# Patient Record
Sex: Female | Born: 1940 | Race: Black or African American | Hispanic: No | Marital: Married | Smoking: Never smoker
Health system: Southern US, Community
[De-identification: ages and names within clinical notes are randomized; demographics above are authoritative.]

## PROBLEM LIST (undated history)

## (undated) DIAGNOSIS — I10 Essential (primary) hypertension: Secondary | ICD-10-CM

## (undated) DIAGNOSIS — K219 Gastro-esophageal reflux disease without esophagitis: Secondary | ICD-10-CM

## (undated) DIAGNOSIS — I639 Cerebral infarction, unspecified: Secondary | ICD-10-CM

## (undated) DIAGNOSIS — I209 Angina pectoris, unspecified: Secondary | ICD-10-CM

## (undated) DIAGNOSIS — Z973 Presence of spectacles and contact lenses: Secondary | ICD-10-CM

## (undated) DIAGNOSIS — I2510 Atherosclerotic heart disease of native coronary artery without angina pectoris: Secondary | ICD-10-CM

## (undated) DIAGNOSIS — I499 Cardiac arrhythmia, unspecified: Secondary | ICD-10-CM

## (undated) DIAGNOSIS — H409 Unspecified glaucoma: Secondary | ICD-10-CM

## (undated) DIAGNOSIS — E785 Hyperlipidemia, unspecified: Secondary | ICD-10-CM

## (undated) DIAGNOSIS — E119 Type 2 diabetes mellitus without complications: Secondary | ICD-10-CM

## (undated) DIAGNOSIS — N200 Calculus of kidney: Secondary | ICD-10-CM

## (undated) DIAGNOSIS — N390 Urinary tract infection, site not specified: Secondary | ICD-10-CM

## (undated) DIAGNOSIS — M1990 Unspecified osteoarthritis, unspecified site: Secondary | ICD-10-CM

## (undated) HISTORY — DX: Cerebral infarction, unspecified: I63.9

## (undated) HISTORY — PX: ROTATOR CUFF REPAIR: SHX139

## (undated) HISTORY — PX: EYE SURGERY: SHX253

## (undated) HISTORY — DX: Hyperlipidemia, unspecified: E78.5

## (undated) HISTORY — DX: Unspecified osteoarthritis, unspecified site: M19.90

## (undated) HISTORY — DX: Type 2 diabetes mellitus without complications: E11.9

---

## 2002-03-17 ENCOUNTER — Other Ambulatory Visit: Admission: RE | Admit: 2002-03-17 | Discharge: 2002-03-17 | Payer: Self-pay | Admitting: Family Medicine

## 2002-08-02 ENCOUNTER — Encounter: Payer: Self-pay | Admitting: Family Medicine

## 2002-08-02 ENCOUNTER — Encounter: Admission: RE | Admit: 2002-08-02 | Discharge: 2002-08-02 | Payer: Self-pay | Admitting: Family Medicine

## 2002-08-03 ENCOUNTER — Encounter: Payer: Self-pay | Admitting: Urology

## 2002-08-03 ENCOUNTER — Encounter: Admission: RE | Admit: 2002-08-03 | Discharge: 2002-08-03 | Payer: Self-pay | Admitting: Urology

## 2002-08-09 ENCOUNTER — Encounter: Payer: Self-pay | Admitting: Urology

## 2002-08-09 ENCOUNTER — Encounter: Admission: RE | Admit: 2002-08-09 | Discharge: 2002-08-09 | Payer: Self-pay | Admitting: Urology

## 2002-08-18 ENCOUNTER — Encounter: Payer: Self-pay | Admitting: Urology

## 2002-08-18 ENCOUNTER — Encounter: Admission: RE | Admit: 2002-08-18 | Discharge: 2002-08-18 | Payer: Self-pay | Admitting: Urology

## 2002-08-24 ENCOUNTER — Encounter: Payer: Self-pay | Admitting: Urology

## 2002-08-24 ENCOUNTER — Ambulatory Visit (HOSPITAL_COMMUNITY): Admission: RE | Admit: 2002-08-24 | Discharge: 2002-08-24 | Payer: Self-pay | Admitting: Urology

## 2002-09-07 ENCOUNTER — Encounter: Payer: Self-pay | Admitting: Urology

## 2002-09-07 ENCOUNTER — Encounter: Admission: RE | Admit: 2002-09-07 | Discharge: 2002-09-07 | Payer: Self-pay | Admitting: Urology

## 2002-10-12 ENCOUNTER — Encounter: Payer: Self-pay | Admitting: Urology

## 2002-10-12 ENCOUNTER — Encounter: Admission: RE | Admit: 2002-10-12 | Discharge: 2002-10-12 | Payer: Self-pay | Admitting: Urology

## 2002-10-21 ENCOUNTER — Encounter: Admission: RE | Admit: 2002-10-21 | Discharge: 2002-10-21 | Payer: Self-pay | Admitting: Urology

## 2002-10-21 ENCOUNTER — Encounter: Payer: Self-pay | Admitting: Urology

## 2002-10-25 ENCOUNTER — Encounter: Payer: Self-pay | Admitting: Urology

## 2002-10-25 ENCOUNTER — Ambulatory Visit (HOSPITAL_BASED_OUTPATIENT_CLINIC_OR_DEPARTMENT_OTHER): Admission: RE | Admit: 2002-10-25 | Discharge: 2002-10-25 | Payer: Self-pay | Admitting: Urology

## 2002-11-09 ENCOUNTER — Encounter: Admission: RE | Admit: 2002-11-09 | Discharge: 2002-11-09 | Payer: Self-pay | Admitting: Urology

## 2002-11-09 ENCOUNTER — Encounter: Payer: Self-pay | Admitting: Urology

## 2002-12-06 ENCOUNTER — Encounter: Payer: Self-pay | Admitting: Urology

## 2002-12-06 ENCOUNTER — Ambulatory Visit (HOSPITAL_BASED_OUTPATIENT_CLINIC_OR_DEPARTMENT_OTHER): Admission: RE | Admit: 2002-12-06 | Discharge: 2002-12-06 | Payer: Self-pay | Admitting: Urology

## 2002-12-20 ENCOUNTER — Encounter: Payer: Self-pay | Admitting: Urology

## 2002-12-20 ENCOUNTER — Encounter: Admission: RE | Admit: 2002-12-20 | Discharge: 2002-12-20 | Payer: Self-pay | Admitting: Urology

## 2006-05-16 ENCOUNTER — Ambulatory Visit (HOSPITAL_BASED_OUTPATIENT_CLINIC_OR_DEPARTMENT_OTHER): Admission: RE | Admit: 2006-05-16 | Discharge: 2006-05-16 | Payer: Self-pay | Admitting: Urology

## 2006-05-17 ENCOUNTER — Emergency Department (HOSPITAL_COMMUNITY): Admission: EM | Admit: 2006-05-17 | Discharge: 2006-05-17 | Payer: Self-pay | Admitting: Emergency Medicine

## 2007-01-30 ENCOUNTER — Inpatient Hospital Stay (HOSPITAL_COMMUNITY): Admission: AD | Admit: 2007-01-30 | Discharge: 2007-02-05 | Payer: Self-pay | Admitting: Internal Medicine

## 2007-02-09 ENCOUNTER — Ambulatory Visit (HOSPITAL_COMMUNITY): Admission: RE | Admit: 2007-02-09 | Discharge: 2007-02-09 | Payer: Self-pay | Admitting: Urology

## 2007-07-21 ENCOUNTER — Encounter: Admission: RE | Admit: 2007-07-21 | Discharge: 2007-07-21 | Payer: Self-pay | Admitting: Family Medicine

## 2007-07-31 ENCOUNTER — Encounter: Admission: RE | Admit: 2007-07-31 | Discharge: 2007-07-31 | Payer: Self-pay | Admitting: Family Medicine

## 2007-08-20 ENCOUNTER — Ambulatory Visit (HOSPITAL_COMMUNITY): Admission: RE | Admit: 2007-08-20 | Discharge: 2007-08-21 | Payer: Self-pay | Admitting: Orthopedic Surgery

## 2007-09-29 ENCOUNTER — Encounter: Admission: RE | Admit: 2007-09-29 | Discharge: 2007-10-27 | Payer: Self-pay | Admitting: Orthopedic Surgery

## 2011-04-02 NOTE — Op Note (Signed)
NAME:  CORLISS, COGGESHALL NO.:  [id number]   MEDICAL RECORD NO.:  [id number]          PATIENT TYPE:  AMB   LOCATION:  SDS                          FACILITY:  [facility]   PHYSICIAN:  Myrtie Neither, MD      DATE OF BIRTH:  [birth year]   DATE OF PROCEDURE:  08/20/2007  DATE OF DISCHARGE:                               OPERATIVE REPORT   PREOPERATIVE DIAGNOSIS:  Impingement syndrome, rotator cuff tear, left  shoulder.   POSTOPERATIVE DIAGNOSIS:  Impingement syndrome, rotator cuff tear, left  shoulder, with chronic synovitis.   ANESTHESIA:  General.   PROCEDURE:  1. Arthroscopic synovectomy and acromioplasty and decompression, left      shoulder.  2. Mini open rotator cuff repair with Biomet suture anchor.   DESCRIPTION OF PROCEDURE:  The patient was taken to the operating room.  After being given adequate preop medications, general anesthesia, and  being intubated, the patient was placed in barber chair position.  The  left shoulder was prepped with DuraPrep.   A 1/2-inch puncture wound was made posteriorly.  A lateral incision was  made and a separate posterior incision made.  Arthroscope placed through  lateral incision.  Inflow was through the arthroscope.  The shaver was  placed posterolaterally.  Inspection of the joint revealed hypertrophic  overgrowth of the subacromial bursal sac, eburnation of the subacromial  surface, with downward tilt of the acromion.  Complete synovectomy was  done followed by acromioplasty and decompression of the coracohumeral  ligament.  After adequate debridement and decompression, the lateral  incision was extended down to the deltoid.  The deltoid was split.  The  rotator cuff tear was identified involving the subscapularis, V-shaped  tear.  With the use of suture anchor, this was closed completely.  Further inspection did not reveal any impingement.  With the use of a  rasp, the anterior lip of the acromion were further smoothed.   Copious  irrigation was done.  Wound closure was then done with 0 for the fascia,  2-0 for the subcutaneous, and skin staples for the skin.  A compressive  dressing was applied.  The patient was placed in shoulder abduction  pillow brace.   The patient tolerated the procedure quite well and went to the recovery  room in stable and satisfactory condition.  The patient is being kept  for 23-hour observation for pain control.  The patient will be  discharged in stable and satisfactory condition to continue her shoulder  abduction brace, ice packs, and will be discharged on Percocet 5 mg 1  q.6h. p.r.n. for pain.      Myrtie Neither, MD  Electronically Signed     AC/MEDQ  D:  08/20/2007  T:  08/20/2007  Job:  [id number]

## 2011-04-05 NOTE — Consult Note (Signed)
NAME:  Audrey Mullen, Audrey Mullen NO.:  [id number]   MEDICAL RECORD NO.:  [id number]          PATIENT TYPE:  INP   LOCATION:  6735                         FACILITY:  [facility]   PHYSICIAN:  Lindaann Slough, M.D.  DATE OF BIRTH:  1941-01-14   DATE OF CONSULTATION:  02/03/2007  DATE OF DISCHARGE:                                 CONSULTATION   REASON FOR CONSULTATION:  Right hydronephrosis.   HISTORY:  The patient is a 70 year old female who has a history of  kidney stone.  She was admitted on January 30, 2007 with a 1-2 weeks  history of chest pain, shortness of breath and vomiting.  The pain was  diffuse across her chest.  On admission, her creatinine was 2.02.  She  has a past history of kidney stone and had stone extraction done on May 16, 2006.  She has been doing well since then.  She has not had any back  pain.  However, about 4 or 5 days ago she had severe right and left  lower quadrant pain but the pain did not last.  The pain was severe in  quality and nature and nonradiating.  She denies frequency or gross  hematuria.  Renal ultrasound showed moderate right hydronephrosis and a  known stone in the lower pole of the left kidney.  She has been on  liquid diet and she has not had any nausea and vomiting.  However, when  she takes solid food, she starts to throw up.   PAST MEDICAL HISTORY:  1. Kidney stone.  2. Hypertension.  3. Diabetes.  4. Hyperlipidemia.  5. Coronary artery disease.   PAST SURGICAL HISTORY:  She had left ureteral stone extraction done in  June2007 and history of left ESL in December2003 and January 2004.   SOCIAL HISTORY:  She is married.  Has one child.  Does not smoke nor  drink.   FAMILY HISTORY:  Is positive for hypertension, heart disease, diabetes,  and kidney stones.  She has four brothers and four sisters.   ALLERGIES:  NO KNOWN DRUG ALLERGIES.   MEDICATIONS:  Diovan/HCT and Avandia.   REVIEW OF SYSTEMS:  Review of systems is as per  the HPI and everything  else is negative.   PHYSICAL EXAMINATION:  GENERAL:  This is a well-developed, 70 year old  female who is currently in no acute distress.  She is alert and  oriented.  VITAL SIGNS:  Blood pressure is 143/80, pulse 76, respirations 20,  temperature 99.5.  SKIN:  She has no skin rash  ABDOMEN:  Soft and nontender.  She has no CVA tenderness.  Kidneys are  not palpable.  She has no hepatomegaly, no splenomegaly.  Bladder is not  distended.  She has no umbilical or inguinal hernia.  There is no  inguinal adenopathy.  Bowel sounds are normal.  I did not do a pelvic  examination.   LABORATORY DATA:  Hemoglobin 9, hematocrit 26.3 and WBC 8.3.  Sodium  136, potassium 3.5, BUN 6 at this time; was 17 on admission.  Creatinine  is now 1.55; was 2.02 on admission.  Renal ultrasound shows moderate  right hydronephrosis and a 7 mm stone in the lower pole of the left  kidney that is nonobstructing.   IMPRESSION:  1. Left renal calculus.  2. Right hydronephrosis.  3. Diabetes.  4. Hypertension.   SUGGESTIONS:  CT scan of the abdomen and pelvis without IV contrast to  rule out ureteral calculus as the cause of hydronephrosis.  Further  recommendation will depend on the results of CT scan.      Lindaann Slough, M.D.  Electronically Signed     MN/MEDQ  D:  02/03/2007  T:  02/04/2007  Job:  [id number]

## 2011-04-05 NOTE — Op Note (Signed)
NAME:  Audrey Mullen, Audrey Mullen NO.:  [id number]   MEDICAL RECORD NO.:  [id number]          PATIENT TYPE:  AMB   LOCATION:  NESC                         FACILITY:  [facility]   PHYSICIAN:  Mark C. Vernie Ammons, M.D.  DATE OF BIRTH:  1941-06-04   DATE OF PROCEDURE:  DATE OF DISCHARGE:                                 OPERATIVE REPORT   PREOPERATIVE DIAGNOSIS:  Left renal calculi postop.   POSTOPERATIVE DIAGNOSIS:  1.  Left renal calculi.  2.  Urethral stenosis.  3.  Upper pole infundibular stenosis.   PROCEDURE:  Cystoscopy, left retrograde pyelogram with interpretation,  urethral dilatation, left ureteroscopy, laser lithotripsy, stone extraction  and double-J stent placement.   SURGEON:  Mark C. Vernie Ammons, M.D.   ANESTHESIA:  General.   SPECIMEN:  Stone to patient.   BLOOD LOSS:  Minimal.   DRAINS:  6-French 24 cm Polaris stent (with string).   COMPLICATIONS:  None.   INDICATIONS:  The patient is a 70 year old black female who has had  difficulty with renal calculi in the past.  She underwent lithotripsy of the  stones twice in the past.  She complains of some flank discomfort.  We  discussed all treatment options.  These are outlined in my office notes  which have been placed on the chart.  She has elected to proceed with  ureteroscopic extraction of as many stones as possible and understands the  risks, complications and alternatives.   DESCRIPTION OF OPERATION:  After informed consent, the patient was brought  to the major OR, placed on table, administered general anesthesia. She is  then moved to the dorsal lithotomy position.  Genitalia sterilely prepped  and draped.  Initial attempt at passing a 22-French cystoscope with sheath  with obturator was unsuccessful due to some mild urethral stenosis.  I  therefore dilated with female sounds from 18 to 28-French.  I then was  easily able to pass the 22-French cystoscope into the bladder.   Cystoscopy was performed  using a 12 and 70 degrees lenses.  The bladder was  fully inspect noted be free of any tumor, stones or inflammatory lesions.  Mild cystocele was identified.  The left orifice was then identified and  0.038 inch floppy tip guidewire was then passed up the left ureter under  direct fluoroscopic control into the area of the left kidney.  When I then  left the guidewire in place and removed the cystoscope.  Over the guidewire,  the inner cannula of the access sheath was passed over the guidewire in  order to facilitate gentle dilatation of the ureter.  This passed up the  ureter easily; however, when the outer working portion of the access sheath  was placed on the obturator, an attempt pass up the ureter was unsuccessful,  met with some mild resistance.  I therefore removed the access sheath and  passed a 10 cm 15 French ureteral dilating balloon over the guidewire and  dilated the distal ureter.  I then removed that and then reinserted the  access sheath and was easily able to pass this up into the area of the UPJ.  The  guidewire was then removed and then the ureteroscope was passed through  the access sheath into the area of the renal pelvis.   Retrograde pyelogram was then performed in order to delineate the anatomy of  the collecting system.  After performing this, systematic search of each of  the calyces was undertaken.  I began at the superior aspect of the kidney.  I noted no stone but there did appear to be infundibulum that narrowed  almost to a beak on retrograde that appeared to have a small pit associated  with it.  I placed the scope at the location of this pit and injected  contrast but could not get it to fill beyond the tip of the scope.  I  therefore continued systematic search of each calix of the kidney.  This was  performed in a systematic fashion from superior to inferior with each calix  and infundibulum being inspected.  In the most dependent portion of the  kidney, I  was able to identify several stones.  I first used a 200 micron  laser fiber to attempt fragmentation although the stone seemed particularly  hard.  I therefore abandoned the laser fragmentation and passed a nitinol  basket through the ureteroscope.  I grasped the first stone and easily  removed this through the access sheath.  I then reinserted the scope,  identified a second stone and removed that.  A total of four stones removed  in this fashion without difficulty or injury to the ureter.   I then reinserted the ureteroscope and inspected each calix again noting all  to be free of stones at this time.  The beak area appears to be a stenosed  infundibulum and the stone could be seen beyond this indicating that this is  in a calix portended by the infundibulum and therefore the stone has no  chance of passage in the future.   A 0.03 inches floppy tip guidewire was then passed through the ureteroscope  and into the upper portion of the renal pelvis and the ureteroscope removed.  The cystoscope back loaded over the guidewire in the Polaris stent passed  over the guidewire with the guidewire being removed and good curl being  noted in the area of the renal pelvis.  The string was left affixed to the  stent and the cystoscope removed.  The string was affixed to the inner  thigh.  The patient was awakened and taken to recovery room stable  satisfactory condition.  She tolerated procedure well.  There were no  intraoperative complications.   She will be given a prescription for Pyridium Plus to be taken q.6-8 h.  p.r.n. for bladder irritation, #30 and Vicoprofen one to two q.4 h p.r.n.  pain #30.  She will follow-up in the office in 5-6 days for stent removal by  Dr. Brunilda Payor.      Mark C. Vernie Ammons, M.D.  Electronically Signed     MCO/MEDQ  D:  05/16/2006  T:  05/16/2006  Job:  [id number]

## 2011-04-05 NOTE — Discharge Summary (Signed)
NAME:  EMINE, LOPATA NO.:  [id number]   MEDICAL RECORD NO.:  [id number]          PATIENT TYPE:  INP   LOCATION:  6735                         FACILITY:  [facility]   PHYSICIAN:  Isidor Holts, M.D.  DATE OF BIRTH:  [birth year]   DATE OF ADMISSION:  01/30/2007  DATE OF DISCHARGE:  02/05/2007                               DISCHARGE SUMMARY   DISCHARGE DIAGNOSES:  1. Atypical chest pain, likely musculoskeletal.  Work-up negative.  2. Urolithiasis/obstructive uropathy with right hydronephrosis,      secondary to ureteropelvic junction stone.  3. Nausea and vomiting.  4. Dehydration/acute renal failure secondary to #3.  5. Hypertension.  6. Type 2 diabetes mellitus.  7. Dyslipidemia.  8. Coronary artery disease.   DISCHARGE MEDICATIONS:  1. Aspirin, enteric coated, 81 mg p.o. daily.  2. Diovan 40 mg p.o. daily (the patient was on Diovan/HCT).  3. Zocor 20 mg p.o. q.h.s.  4. Niacin 500 mg p.o. q.h.s.  5. Vicodin (5/500) one p.o. p.r.n. q.4-6h. for pain. A total of 42      pills have been dispensed.   PROCEDURES:  1. Two view chest x-ray dated January 30, 2007 that showed poor      inspiration versus increased bronchial markings which could be      acute or chronic.  2. Ventilation perfusion lung scan dated February 01, 2007.  This was a      normal perfusion lung scan.  No evidence of pulmonary embolic      disease.  3. Two view chest x-ray dated February 01, 2007.  This showed chronic      changes.  4. Abdominal x-ray dated February 01, 2007.  This showed a 6 mm diameter      left lower pole renal calculous, question 6 mm diameter calculous      proximal right ureter.  5. Renal ultrasound scan dated February 02, 2007.  This showed moderate      hydronephrosis.  The proximal right ureter is prominent in the      visualized aspect, but the stone projecting over the right flank on      plain film is not visualized.  A 7 mm left lower pole non-      obstructing calculous.  6. Abdominal CT scan dated February 03, 2007.  This showed a 7.5 x 3.5 mm      obstructing right UPJ calculous with associated moderate      hydronephrosis, small right renal calculi, small scar in the left      kidney with renal calculi.  There appears to be scarring in the      region of the renal pelvis and upper ureter.  Bilateral low      attenuation adrenal gland lesions, likely adenomas.  Pelvic CT scan      showed no obstructing urethral calculi, normal uterus and ovaries,      calcification in the cul-de-sac which may be an exophytic fibroid.   CONSULTATIONS:  Lindaann Slough, M.D., neurologist.   ADMISSION HISTORY:  As in H&P note of January 30, 2007 dictated by Dr.  Michaelyn Barter. However, in brief, this is  a 70 year old female, with  known history of type 2 diabetes mellitus, hypertension, dyslipidemia,  coronary artery disease, multiple kidney stones, who presents with  atypical sounding chest pain described as diffuse, recurrent, associated  with nausea, vomiting and then shortness of breath, recurrent over the  past 1-2 weeks.  She was admitted for further evaluation and  investigation and management.   CLINICAL COURSE:  1. Atypical chest pain.  For details of presentation, refer to      admission history above.  The patient's chest pain clearly had      atypical features.  She also had localized chest wall tenderness,      lending credence to the hypothesis that this likely could be      musculoskeletal chest pain.  Be that as it may, she underwent a 12-      lead echocardiogram which showed no acute ischemic changes.      Cardiac enzymes were cycled and were unelevated. She underwent      ventilation perfusion lung scan which demonstrated no evidence of      pulmonary embolism.  Chest x-ray also showed no acute findings.      During her hospitalization, she had no further recurrences.  No      further work-up was deemed indicated.   1. Obstructive uropathy.  Imaging  studies demonstrated right      hydronephrosis associated with a UPJ stone; however, there was no      evidence of associated urinary tract infection.  Likely, this was      the cause of renal colic, nausea and vomiting.  the patient was      managed with p.r.n. analgesics.  Urology consultation was called,      which was kindly provided by Dr. Lindaann Slough, who has arranged      to have lithotripsy performed on an outpatient basis, probably on      February 09, 2007 or February 10, 2007.   1. Dehydration/acute renal failure.  At the time of presentation, the      patient had nausea and vomiting.  Likely, this was secondary to      renal colic from # 2, above.  She was found to be clinically      dehydrated.  BUN was 23 with a creatinine of 2.42.  She was managed      with aggressive intravenous fluid hydration, with normalization of      hydration status, so that as of February 05, 2007, BUN was 7 with a      creatinine of 1.71.  In addition, nausea and vomiting were      addressed with p.r.n. antiemetics with complete amelioration of      symptoms.  She was able to tolerate a regular diet on February 04, 2007.   1. Type 2 diabetes mellitus.  The patient presents with a history of      borderline diabetes mellitus.  She has been managed with sliding      scale insulin coverage throughout the course of her      hospitalization, and her CBGs have remained within reasonable      limits.  We shall defer continued follow up, monitoring and      treatment of her diabetes mellitus to her primary M.D.  During this      hospitalization, CBGs ranged between 103 to 167.   1. Hypertension.  The patient's Diovan-HCT was held at the time  of      presentation, because of dehydration and acute renal failure.  Now      that her renal status have improved, we have restarted Diovan at an      initial dose of 40 mg p.o. daily.  1. Dyslipidemia.  The patient's lipid profile showed total cholesterol       279, triglycerides 230, HDL 73, LDL 190.  This was addressed with a      combination of Zocor and Niacin.  We expect the patient's primary      care physician to continue to monitor her lipid profile and adjust      medications as indicated.   DISPOSITION:  The patient was considered to be sufficiently clinically  recovered and stable, to be discharged on February 05, 2007, and she is  very keen to be discharged home.  I have discussed further management of  her urolithiasis with Dr. Lindaann Slough.  He has confirmed that he  plans to do a lithotripsy in the coming week.  He will, of course,  discuss this with the patient and schedule an appointment.  She will  therefore be provided with all the appropriate information, prior to  discharge.   DIET:  Heart healthy/Carbohydrate-modified.   ACTIVITY:  As tolerated.   FOLLOW UP:  Routine followup with PMD, and follow up with Dr Brunilda Payor, at a  date to be arranged.      Isidor Holts, M.D.  Electronically Signed     CO/MEDQ  D:  02/05/2007  T:  02/05/2007  Job:  [id number]   cc:   Lindaann Slough, M.D.  Renaye Rakers, M.D.  Madaline Savage, M.D.

## 2011-04-05 NOTE — H&P (Signed)
NAME:  Audrey Mullen, Audrey Mullen NO.:  [id number]   MEDICAL RECORD NO.:  [id number]          PATIENT TYPE:  INP   LOCATION:  5736                         FACILITY:  [facility]   PHYSICIAN:  Audrey Mullen, M.D. DATE OF BIRTH:  1941-11-05   DATE OF ADMISSION:  01/30/2007  DATE OF DISCHARGE:                              HISTORY & PHYSICAL   PRIMARY CARE PHYSICIAN:  Audrey Mullen, M.D.   CARDIOLOGIST:  Audrey Mullen, M.D.   CHIEF COMPLAINT:  Chest pain.   HISTORY OF PRESENT ILLNESS:  Audrey Mullen is a 70 year old female who  was directly admitted from her primary care physician, Dr. Salena Mullen  office.  She is somewhat  of a poor historian with regards to the  details of what brought her into the hospital.  She states that she has  not been feeling good for at least 1-2 weeks.  Her illness began with  the onset of chest pain followed by emesis, then shortness of breath.  She describes her chest pain as being diffuse across her chest.  She  equates it as having a knife/stabbing type of sensation.  She states  that at least one week ago she went to see her primary care physician  for evaluation.  She is very vague with regards to what she was told  might be happening and also the workup that ensued during that visit.  She did state that she was told she may have some sort of a viral  infection.  She was treated and discharged home and told to follow up  within 1 week.  Initially she began to feel better.  However, sometime  last night her chest pain returned and she developed emesis and  shortness of breath.  She also complains of being constipated, having no  bowel movement for several days.  She has had some chills but denies  fevers.  No sick contacts, no weight changes.  She states that her  entire chest feels as if she is having some sort of indigestion.  It  feels tight, throbbing in character.  She denies any radiation of her  pain to her neck, back or down her  arm.  She also states that she can  feel the chest symptoms in her throat.  She denies having a cough.   PAST MEDICAL HISTORY:  1. Hypertension.  2. Borderline diabetes mellitus.  3. Hyperlipidemia.  4. Coronary artery disease.  The patient states that during a prior      cardiac catheterization she was told that she had some blockages      within her vessels.  5. Stress test.  The patient indicates that she had a cardiovascular      stress test completed only a couple of weeks ago and at that time      she was told that everything is okay.  6. Multiple kidney stones.   PAST SURGICAL HISTORY:  Right kidney stone surgically extracted multiple  times.   HOME MEDICATIONS:  The patient cannot remember the names of her home  medications.   SOCIAL HISTORY:  Cigarettes:  The patient denies.  Alcohol:  The patient  denies.   FAMILY HISTORY:  Mother:  Medical history is unknown.  Father:  Had  heart problems.   REVIEW OF SYSTEMS:  As per HPI.   PHYSICAL EXAMINATION:  VITAL SIGNS:  Temperature is 97.8, heart rate 73,  blood pressure 144/78, O2 saturation 100% on room air.  HEENT:  Normocephalic, atraumatic, anicteric.  Oral mucosa is dry, pink.  No thrush, no exudates.  NECK:  No JVD.  Supple.  No lymphadenopathy, no thyromegaly.  CARDIAC:  Heart sounds are distant.  RESPIRATORY:  No crackles or wheezes.  CHEST:  Positive diffuse reproducible pain to palpitation.  ABDOMEN:  Soft, some vague discomfort.  Bowel sounds are hypoactive.  No  masses palpated.  EXTREMITIES:  No leg edema.  NEUROLOGIC:  The patient is alert and oriented x3.  MUSCULOSKELETAL:  5/5.   No labs are currently available.   ASSESSMENT AND PLAN:  1. Chest pain.  The etiology of this is questionable.  There appears      to be a reproducible musculoskeletal component to this.  Will cycle      the patient's cardiac enzymes x3 q.8h. apart, although given the      recent negative stress test, the likelihood of the  patient's      current symptoms being cardiac related is questionable.  Will also      provide morphine, oxygen and aspirin.  In addition, because of the      sudden onset of the chest pain accompanied by shortness of breath,      one has to be concerned about the possibility of a pulmonary      embolism.  I would like to get a CT scan of the chest with      angiographic contrast at this time.  However, I have no routine      labs; therefore, I do not know what the patient's BUN and      creatinine are.  I will attempt to verify these first and then      obtain CT scan of the chest.  If this is not possible, then will      obtain a V/Q scan.  2. Dehydration.  Will continue aggressive IV fluid hydration for now.      Will check a UA and blood cultures.  3. Nausea and vomiting.  The source of this is questionable.  Will      provide p.r.n. Zofran.  4. Hypertension.  Will attempt to verify the patient's prior home      medications and resume those.  5. History of borderline diabetes mellitus.  Will perform Accu-Cheks      a.c. and h.s. and check a hemoglobin A1c.  6. Gastrointestinal prophylaxis.  Will provide Protonix.  7. Deep venous thrombosis prophylaxis.  Will provide Lovenox.  8. History of dyslipidemia.  Will check a fasting lipid profile.      Audrey Mullen, M.D.  Electronically Signed     OR/MEDQ  D:  01/30/2007  T:  01/30/2007  Job:  [id number]   cc:   Audrey Mullen, M.D.  Audrey Mullen, M.D.

## 2011-08-29 LAB — URINALYSIS, ROUTINE W REFLEX MICROSCOPIC
Nitrite: POSITIVE — AB
Protein, ur: NEGATIVE
Urobilinogen, UA: 0.2

## 2011-08-29 LAB — COMPREHENSIVE METABOLIC PANEL
BUN: 18
Calcium: 9.8
Glucose, Bld: 124 — ABNORMAL HIGH
Sodium: 141
Total Protein: 7.1

## 2011-08-29 LAB — CBC
Hemoglobin: 11.6 — ABNORMAL LOW
MCHC: 32.8
Platelets: 214
RDW: 15.6 — ABNORMAL HIGH

## 2011-08-29 LAB — PROTIME-INR
INR: 0.9
Prothrombin Time: 12.7

## 2011-08-29 LAB — URINE MICROSCOPIC-ADD ON

## 2013-08-13 ENCOUNTER — Ambulatory Visit (INDEPENDENT_AMBULATORY_CARE_PROVIDER_SITE_OTHER): Payer: Medicare Other | Admitting: Emergency Medicine

## 2013-08-13 VITALS — BP 132/84 | HR 72 | Temp 99.0°F | Resp 18 | Ht 60.5 in | Wt 161.6 lb

## 2013-08-13 DIAGNOSIS — R42 Dizziness and giddiness: Secondary | ICD-10-CM

## 2013-08-13 DIAGNOSIS — I493 Ventricular premature depolarization: Secondary | ICD-10-CM

## 2013-08-13 DIAGNOSIS — E119 Type 2 diabetes mellitus without complications: Secondary | ICD-10-CM

## 2013-08-13 DIAGNOSIS — I499 Cardiac arrhythmia, unspecified: Secondary | ICD-10-CM

## 2013-08-13 LAB — POCT CBC
Hemoglobin: 12.3 g/dL (ref 12.2–16.2)
MCHC: 31.1 g/dL — AB (ref 31.8–35.4)
MPV: 11 fL (ref 0–99.8)
POC Granulocyte: 3.3 (ref 2–6.9)
POC MID %: 9.4 %M (ref 0–12)
RBC: 4.44 M/uL (ref 4.04–5.48)

## 2013-08-13 LAB — GLUCOSE, POCT (MANUAL RESULT ENTRY): POC Glucose: 128 mg/dl — AB (ref 70–99)

## 2013-08-13 MED ORDER — AMOXICILLIN 875 MG PO TABS: 875.0000 mg | ORAL_TABLET | Freq: Two times a day (BID) | ORAL | Status: DC

## 2013-08-13 MED ORDER — FLUTICASONE PROPIONATE 50 MCG/ACT NA SUSP: 2.0000 | Freq: Every day | NASAL | Status: DC

## 2013-08-13 NOTE — Progress Notes (Signed)
  Subjective:    Patient ID: Audrey Mullen, female    DOB: 04/15/1941, 72 y.o.   MRN: [id number]  HPI patient states that 2 weeks ago she got very cold. Following this she developed nasal congestion dripping nose and a postnasal drip. This has persisted for the last 2 weeks. She occasionally has some dizziness. She states she feels a little unsteady if she turns her head quickly. Her arms or legs. She denies purulent drainage. She has not had any numbness or weakness in her arms or legs. She denies any chest pain or palpitations.    Review of Systems     Objective:   Physical Exam the patient is alert and cooperative she is not in any distress. Both TMs are somewhat dull with diminished light reflex. Her neck is supple. Chest exam is clear to both auscultation and percussion. Cardiac exam reveals an irregular heart rhythm at times. At times her heart rhythm is regular. No murmurs were appreciated extremity exam reveals no weakness or numbness. Cranial nerves are intact  EKG normal sinus rhythm with occasional PVCs      Assessment & Plan:  Treat with Flonase and amoxicillin. Ambulatory referral made to cardiology to evaluate PVCs. I did not feel her symptoms were related to this

## 2013-09-27 ENCOUNTER — Other Ambulatory Visit: Payer: Self-pay | Admitting: Family Medicine

## 2013-09-27 ENCOUNTER — Ambulatory Visit
Admission: RE | Admit: 2013-09-27 | Discharge: 2013-09-27 | Disposition: A | Discharge status: Home / Self Care | Payer: Medicare Other | Source: Ambulatory Visit | Attending: Family Medicine | Admitting: Family Medicine

## 2013-09-27 DIAGNOSIS — M542 Cervicalgia: Secondary | ICD-10-CM

## 2013-09-27 DIAGNOSIS — M549 Dorsalgia, unspecified: Secondary | ICD-10-CM

## 2015-02-27 DIAGNOSIS — R7309 Other abnormal glucose: Secondary | ICD-10-CM | POA: Diagnosis not present

## 2015-02-27 DIAGNOSIS — I10 Essential (primary) hypertension: Secondary | ICD-10-CM | POA: Diagnosis not present

## 2015-03-03 DIAGNOSIS — E0811 Diabetes mellitus due to underlying condition with ketoacidosis with coma: Secondary | ICD-10-CM | POA: Diagnosis not present

## 2015-03-03 DIAGNOSIS — E782 Mixed hyperlipidemia: Secondary | ICD-10-CM | POA: Diagnosis not present

## 2015-03-03 DIAGNOSIS — I10 Essential (primary) hypertension: Secondary | ICD-10-CM | POA: Diagnosis not present

## 2015-03-27 DIAGNOSIS — H4011X2 Primary open-angle glaucoma, moderate stage: Secondary | ICD-10-CM | POA: Diagnosis not present

## 2015-03-27 DIAGNOSIS — H25013 Cortical age-related cataract, bilateral: Secondary | ICD-10-CM | POA: Diagnosis not present

## 2015-04-10 DIAGNOSIS — I10 Essential (primary) hypertension: Secondary | ICD-10-CM | POA: Diagnosis not present

## 2015-04-10 DIAGNOSIS — E0811 Diabetes mellitus due to underlying condition with ketoacidosis with coma: Secondary | ICD-10-CM | POA: Diagnosis not present

## 2015-04-10 DIAGNOSIS — E782 Mixed hyperlipidemia: Secondary | ICD-10-CM | POA: Diagnosis not present

## 2015-06-09 DIAGNOSIS — I10 Essential (primary) hypertension: Secondary | ICD-10-CM | POA: Diagnosis not present

## 2015-06-09 DIAGNOSIS — R7309 Other abnormal glucose: Secondary | ICD-10-CM | POA: Diagnosis not present

## 2015-06-14 DIAGNOSIS — E0811 Diabetes mellitus due to underlying condition with ketoacidosis with coma: Secondary | ICD-10-CM | POA: Diagnosis not present

## 2015-06-14 DIAGNOSIS — I10 Essential (primary) hypertension: Secondary | ICD-10-CM | POA: Diagnosis not present

## 2015-06-14 DIAGNOSIS — E782 Mixed hyperlipidemia: Secondary | ICD-10-CM | POA: Diagnosis not present

## 2015-06-28 DIAGNOSIS — E782 Mixed hyperlipidemia: Secondary | ICD-10-CM | POA: Diagnosis not present

## 2015-06-28 DIAGNOSIS — I10 Essential (primary) hypertension: Secondary | ICD-10-CM | POA: Diagnosis not present

## 2015-06-28 DIAGNOSIS — E0811 Diabetes mellitus due to underlying condition with ketoacidosis with coma: Secondary | ICD-10-CM | POA: Diagnosis not present

## 2015-07-13 DIAGNOSIS — H6523 Chronic serous otitis media, bilateral: Secondary | ICD-10-CM | POA: Diagnosis not present

## 2015-07-28 DIAGNOSIS — H6522 Chronic serous otitis media, left ear: Secondary | ICD-10-CM | POA: Diagnosis not present

## 2015-08-03 DIAGNOSIS — H4010X2 Unspecified open-angle glaucoma, moderate stage: Secondary | ICD-10-CM | POA: Diagnosis not present

## 2015-08-03 DIAGNOSIS — H4011X2 Primary open-angle glaucoma, moderate stage: Secondary | ICD-10-CM | POA: Diagnosis not present

## 2015-08-11 DIAGNOSIS — H6521 Chronic serous otitis media, right ear: Secondary | ICD-10-CM | POA: Diagnosis not present

## 2015-10-27 DIAGNOSIS — Z1231 Encounter for screening mammogram for malignant neoplasm of breast: Secondary | ICD-10-CM | POA: Diagnosis not present

## 2015-11-07 DIAGNOSIS — E7800 Pure hypercholesterolemia, unspecified: Secondary | ICD-10-CM | POA: Diagnosis not present

## 2015-11-07 DIAGNOSIS — I10 Essential (primary) hypertension: Secondary | ICD-10-CM | POA: Diagnosis not present

## 2015-11-07 DIAGNOSIS — E0811 Diabetes mellitus due to underlying condition with ketoacidosis with coma: Secondary | ICD-10-CM | POA: Diagnosis not present

## 2015-11-10 DIAGNOSIS — I10 Essential (primary) hypertension: Secondary | ICD-10-CM | POA: Diagnosis not present

## 2015-11-10 DIAGNOSIS — Z6828 Body mass index (BMI) 28.0-28.9, adult: Secondary | ICD-10-CM | POA: Diagnosis not present

## 2015-11-10 DIAGNOSIS — E0811 Diabetes mellitus due to underlying condition with ketoacidosis with coma: Secondary | ICD-10-CM | POA: Diagnosis not present

## 2015-11-10 DIAGNOSIS — E782 Mixed hyperlipidemia: Secondary | ICD-10-CM | POA: Diagnosis not present

## 2015-11-29 DIAGNOSIS — H04123 Dry eye syndrome of bilateral lacrimal glands: Secondary | ICD-10-CM | POA: Diagnosis not present

## 2015-11-29 DIAGNOSIS — H35372 Puckering of macula, left eye: Secondary | ICD-10-CM | POA: Diagnosis not present

## 2015-12-01 DIAGNOSIS — E782 Mixed hyperlipidemia: Secondary | ICD-10-CM | POA: Diagnosis not present

## 2015-12-01 DIAGNOSIS — I10 Essential (primary) hypertension: Secondary | ICD-10-CM | POA: Diagnosis not present

## 2015-12-01 DIAGNOSIS — E0811 Diabetes mellitus due to underlying condition with ketoacidosis with coma: Secondary | ICD-10-CM | POA: Diagnosis not present

## 2015-12-13 DIAGNOSIS — H2513 Age-related nuclear cataract, bilateral: Secondary | ICD-10-CM | POA: Diagnosis not present

## 2015-12-13 DIAGNOSIS — H40013 Open angle with borderline findings, low risk, bilateral: Secondary | ICD-10-CM | POA: Diagnosis not present

## 2015-12-28 DIAGNOSIS — H2512 Age-related nuclear cataract, left eye: Secondary | ICD-10-CM | POA: Diagnosis not present

## 2016-02-02 DIAGNOSIS — Z9849 Cataract extraction status, unspecified eye: Secondary | ICD-10-CM | POA: Diagnosis not present

## 2016-04-02 ENCOUNTER — Encounter (HOSPITAL_COMMUNITY): Payer: Self-pay | Admitting: *Deleted

## 2016-04-02 ENCOUNTER — Inpatient Hospital Stay (HOSPITAL_COMMUNITY)
Admission: EM | Admit: 2016-04-02 | Discharge: 2016-04-07 | DRG: 872 | Disposition: A | Discharge status: Home Health | Payer: Medicare Other | Attending: Internal Medicine | Admitting: Internal Medicine

## 2016-04-02 ENCOUNTER — Emergency Department (HOSPITAL_COMMUNITY): Payer: Medicare Other

## 2016-04-02 ENCOUNTER — Inpatient Hospital Stay (HOSPITAL_COMMUNITY): Payer: Medicare Other

## 2016-04-02 DIAGNOSIS — I4891 Unspecified atrial fibrillation: Secondary | ICD-10-CM | POA: Diagnosis not present

## 2016-04-02 DIAGNOSIS — N1339 Other hydronephrosis: Secondary | ICD-10-CM | POA: Diagnosis not present

## 2016-04-02 DIAGNOSIS — N1330 Unspecified hydronephrosis: Secondary | ICD-10-CM

## 2016-04-02 DIAGNOSIS — I482 Chronic atrial fibrillation, unspecified: Secondary | ICD-10-CM

## 2016-04-02 DIAGNOSIS — R778 Other specified abnormalities of plasma proteins: Secondary | ICD-10-CM | POA: Diagnosis present

## 2016-04-02 DIAGNOSIS — E876 Hypokalemia: Secondary | ICD-10-CM | POA: Diagnosis present

## 2016-04-02 DIAGNOSIS — N179 Acute kidney failure, unspecified: Secondary | ICD-10-CM | POA: Diagnosis present

## 2016-04-02 DIAGNOSIS — A419 Sepsis, unspecified organism: Secondary | ICD-10-CM | POA: Diagnosis not present

## 2016-04-02 DIAGNOSIS — N131 Hydronephrosis with ureteral stricture, not elsewhere classified: Secondary | ICD-10-CM | POA: Diagnosis not present

## 2016-04-02 DIAGNOSIS — N202 Calculus of kidney with calculus of ureter: Secondary | ICD-10-CM | POA: Diagnosis present

## 2016-04-02 DIAGNOSIS — I10 Essential (primary) hypertension: Secondary | ICD-10-CM | POA: Diagnosis present

## 2016-04-02 DIAGNOSIS — R0602 Shortness of breath: Secondary | ICD-10-CM | POA: Diagnosis not present

## 2016-04-02 DIAGNOSIS — E119 Type 2 diabetes mellitus without complications: Secondary | ICD-10-CM | POA: Diagnosis present

## 2016-04-02 DIAGNOSIS — N12 Tubulo-interstitial nephritis, not specified as acute or chronic: Secondary | ICD-10-CM | POA: Diagnosis not present

## 2016-04-02 DIAGNOSIS — A409 Streptococcal sepsis, unspecified: Secondary | ICD-10-CM | POA: Diagnosis not present

## 2016-04-02 DIAGNOSIS — N132 Hydronephrosis with renal and ureteral calculous obstruction: Secondary | ICD-10-CM | POA: Diagnosis not present

## 2016-04-02 DIAGNOSIS — E785 Hyperlipidemia, unspecified: Secondary | ICD-10-CM | POA: Diagnosis present

## 2016-04-02 DIAGNOSIS — I480 Paroxysmal atrial fibrillation: Secondary | ICD-10-CM | POA: Diagnosis not present

## 2016-04-02 DIAGNOSIS — R7881 Bacteremia: Secondary | ICD-10-CM

## 2016-04-02 DIAGNOSIS — I4892 Unspecified atrial flutter: Secondary | ICD-10-CM | POA: Diagnosis present

## 2016-04-02 DIAGNOSIS — B370 Candidal stomatitis: Secondary | ICD-10-CM | POA: Diagnosis present

## 2016-04-02 DIAGNOSIS — A4151 Sepsis due to Escherichia coli [E. coli]: Secondary | ICD-10-CM | POA: Diagnosis not present

## 2016-04-02 DIAGNOSIS — N136 Pyonephrosis: Secondary | ICD-10-CM | POA: Diagnosis present

## 2016-04-02 DIAGNOSIS — R05 Cough: Secondary | ICD-10-CM | POA: Diagnosis not present

## 2016-04-02 DIAGNOSIS — N10 Acute tubulo-interstitial nephritis: Secondary | ICD-10-CM | POA: Diagnosis not present

## 2016-04-02 DIAGNOSIS — A4189 Other specified sepsis: Secondary | ICD-10-CM | POA: Diagnosis not present

## 2016-04-02 DIAGNOSIS — Z79899 Other long term (current) drug therapy: Secondary | ICD-10-CM

## 2016-04-02 DIAGNOSIS — D72829 Elevated white blood cell count, unspecified: Secondary | ICD-10-CM | POA: Diagnosis not present

## 2016-04-02 DIAGNOSIS — N200 Calculus of kidney: Secondary | ICD-10-CM | POA: Diagnosis not present

## 2016-04-02 DIAGNOSIS — R1032 Left lower quadrant pain: Secondary | ICD-10-CM | POA: Diagnosis not present

## 2016-04-02 LAB — GLUCOSE, CAPILLARY
GLUCOSE-CAPILLARY: 151 mg/dL — AB (ref 65–99)
Glucose-Capillary: 164 mg/dL — ABNORMAL HIGH (ref 65–99)

## 2016-04-02 LAB — CBC WITH DIFFERENTIAL/PLATELET
BASOS PCT: 0 %
Basophils Absolute: 0 10*3/uL (ref 0.0–0.1)
EOS ABS: 0 10*3/uL (ref 0.0–0.7)
Eosinophils Relative: 0 %
HEMATOCRIT: 38.3 % (ref 36.0–46.0)
Hemoglobin: 12.6 g/dL (ref 12.0–15.0)
LYMPHS ABS: 0.8 10*3/uL (ref 0.7–4.0)
Lymphocytes Relative: 4 %
MCH: 27.7 pg (ref 26.0–34.0)
MCHC: 32.9 g/dL (ref 30.0–36.0)
MCV: 84.2 fL (ref 78.0–100.0)
MONO ABS: 0.6 10*3/uL (ref 0.1–1.0)
Monocytes Relative: 3 %
NEUTROS PCT: 93 %
Neutro Abs: 19.1 10*3/uL — ABNORMAL HIGH (ref 1.7–7.7)
PLATELETS: 144 10*3/uL — AB (ref 150–400)
RBC: 4.55 MIL/uL (ref 3.87–5.11)
RDW: 14.3 % (ref 11.5–15.5)
WBC: 20.5 10*3/uL — ABNORMAL HIGH (ref 4.0–10.5)

## 2016-04-02 LAB — COMPREHENSIVE METABOLIC PANEL
ALK PHOS: 77 U/L (ref 38–126)
ALT: 19 U/L (ref 14–54)
AST: 36 U/L (ref 15–41)
Albumin: 3.9 g/dL (ref 3.5–5.0)
Anion gap: 12 (ref 5–15)
BILIRUBIN TOTAL: 1 mg/dL (ref 0.3–1.2)
BUN: 45 mg/dL — ABNORMAL HIGH (ref 6–20)
CALCIUM: 9.1 mg/dL (ref 8.9–10.3)
CO2: 24 mmol/L (ref 22–32)
CREATININE: 2.62 mg/dL — AB (ref 0.44–1.00)
Chloride: 100 mmol/L — ABNORMAL LOW (ref 101–111)
GFR calc non Af Amer: 17 mL/min — ABNORMAL LOW (ref >60)
GFR, EST AFRICAN AMERICAN: 19 mL/min — AB (ref >60)
Glucose, Bld: 200 mg/dL — ABNORMAL HIGH (ref 65–99)
Potassium: 4.1 mmol/L (ref 3.5–5.1)
Sodium: 136 mmol/L (ref 135–145)
TOTAL PROTEIN: 7.5 g/dL (ref 6.5–8.1)

## 2016-04-02 LAB — URINALYSIS, ROUTINE W REFLEX MICROSCOPIC
BILIRUBIN URINE: NEGATIVE
Glucose, UA: NEGATIVE mg/dL
KETONES UR: NEGATIVE mg/dL
Nitrite: NEGATIVE
PH: 6 (ref 5.0–8.0)
PROTEIN: 100 mg/dL — AB
Specific Gravity, Urine: 1.019 (ref 1.005–1.030)

## 2016-04-02 LAB — I-STAT CG4 LACTIC ACID, ED
LACTIC ACID, VENOUS: 2.08 mmol/L — AB (ref 0.5–2.0)
Lactic Acid, Venous: 2.31 mmol/L (ref 0.5–2.0)

## 2016-04-02 LAB — URINE MICROSCOPIC-ADD ON

## 2016-04-02 LAB — I-STAT TROPONIN, ED
TROPONIN I, POC: 0.15 ng/mL — AB (ref 0.00–0.08)
Troponin i, poc: 0.25 ng/mL (ref 0.00–0.08)

## 2016-04-02 LAB — TROPONIN I
TROPONIN I: 0.39 ng/mL — AB (ref <0.031)
TROPONIN I: 0.84 ng/mL — AB (ref <0.031)

## 2016-04-02 LAB — APTT
APTT: 27 s (ref 24–37)
APTT: 28 s (ref 24–37)

## 2016-04-02 LAB — PROCALCITONIN: Procalcitonin: 134.49 ng/mL

## 2016-04-02 LAB — PROTIME-INR
INR: 1.14 (ref 0.00–1.49)
INR: 1.13 (ref 0.00–1.49)
PROTHROMBIN TIME: 14.7 s (ref 11.6–15.2)
Prothrombin Time: 14.8 seconds (ref 11.6–15.2)

## 2016-04-02 LAB — TSH: TSH: 0.352 u[IU]/mL (ref 0.350–4.500)

## 2016-04-02 LAB — MRSA PCR SCREENING: MRSA by PCR: NEGATIVE

## 2016-04-02 LAB — LIPASE, BLOOD: Lipase: 19 U/L (ref 11–51)

## 2016-04-02 MED ORDER — DIATRIZOATE MEGLUMINE & SODIUM 66-10 % PO SOLN
30.0000 mL | Freq: Once | ORAL | Status: AC
  Administered 2016-04-02: 30 mL via ORAL

## 2016-04-02 MED ORDER — SODIUM CHLORIDE 0.9 % IV BOLUS (SEPSIS)
1000.0000 mL | Freq: Once | INTRAVENOUS | Status: AC
  Administered 2016-04-02: 1000 mL via INTRAVENOUS

## 2016-04-02 MED ORDER — ACETAMINOPHEN 325 MG PO TABS
650.0000 mg | ORAL_TABLET | Freq: Four times a day (QID) | ORAL | Status: DC | PRN
  Administered 2016-04-02 – 2016-04-07 (×3): 650 mg via ORAL
  Filled 2016-04-02 (×3): qty 2

## 2016-04-02 MED ORDER — SODIUM CHLORIDE 0.9% FLUSH
3.0000 mL | Freq: Two times a day (BID) | INTRAVENOUS | Status: DC
  Administered 2016-04-02: 3 mL via INTRAVENOUS

## 2016-04-02 MED ORDER — DILTIAZEM HCL ER COATED BEADS 120 MG PO CP24
120.0000 mg | ORAL_CAPSULE | Freq: Every day | ORAL | Status: DC
  Filled 2016-04-02: qty 1

## 2016-04-02 MED ORDER — OXYCODONE HCL 5 MG PO TABS
5.0000 mg | ORAL_TABLET | ORAL | Status: DC | PRN
  Administered 2016-04-02 – 2016-04-06 (×6): 5 mg via ORAL
  Filled 2016-04-02 (×6): qty 1

## 2016-04-02 MED ORDER — IOPAMIDOL (ISOVUE-300) INJECTION 61%
50.0000 mL | Freq: Once | INTRAVENOUS | Status: AC | PRN
  Administered 2016-04-02: 10 mL via INTRAVENOUS

## 2016-04-02 MED ORDER — DILTIAZEM HCL 100 MG IV SOLR
5.0000 mg/h | INTRAVENOUS | Status: DC
  Administered 2016-04-02 – 2016-04-03 (×2): 5 mg/h via INTRAVENOUS
  Filled 2016-04-02 (×3): qty 100

## 2016-04-02 MED ORDER — HEPARIN SODIUM (PORCINE) 5000 UNIT/ML IJ SOLN
5000.0000 [IU] | Freq: Three times a day (TID) | INTRAMUSCULAR | Status: DC
  Administered 2016-04-02 – 2016-04-05 (×8): 5000 [IU] via SUBCUTANEOUS
  Filled 2016-04-02 (×9): qty 1

## 2016-04-02 MED ORDER — INSULIN ASPART 100 UNIT/ML ~~LOC~~ SOLN
0.0000 [IU] | Freq: Three times a day (TID) | SUBCUTANEOUS | Status: DC
  Administered 2016-04-03 (×2): 3 [IU] via SUBCUTANEOUS
  Administered 2016-04-03: 2 [IU] via SUBCUTANEOUS
  Administered 2016-04-04: 3 [IU] via SUBCUTANEOUS
  Administered 2016-04-04 – 2016-04-05 (×3): 2 [IU] via SUBCUTANEOUS
  Administered 2016-04-05: 3 [IU] via SUBCUTANEOUS
  Administered 2016-04-05 – 2016-04-07 (×5): 2 [IU] via SUBCUTANEOUS

## 2016-04-02 MED ORDER — PIPERACILLIN-TAZOBACTAM 3.375 G IVPB
3.3750 g | Freq: Once | INTRAVENOUS | Status: AC
  Administered 2016-04-02: 3.375 g via INTRAVENOUS
  Filled 2016-04-02: qty 50

## 2016-04-02 MED ORDER — MIDAZOLAM HCL 2 MG/2ML IJ SOLN
INTRAMUSCULAR | Status: AC
  Administered 2016-04-02: 0.5 mg
  Filled 2016-04-02: qty 2

## 2016-04-02 MED ORDER — ONDANSETRON HCL 4 MG/2ML IJ SOLN
4.0000 mg | Freq: Four times a day (QID) | INTRAMUSCULAR | Status: DC | PRN
  Administered 2016-04-03 (×2): 4 mg via INTRAVENOUS
  Filled 2016-04-02 (×2): qty 2

## 2016-04-02 MED ORDER — ONDANSETRON HCL 4 MG PO TABS: 4.0000 mg | ORAL_TABLET | Freq: Four times a day (QID) | ORAL | Status: DC | PRN

## 2016-04-02 MED ORDER — SODIUM CHLORIDE 0.9 % IV SOLN
INTRAVENOUS | Status: DC
  Administered 2016-04-02 – 2016-04-06 (×10): via INTRAVENOUS

## 2016-04-02 MED ORDER — ACETAMINOPHEN 650 MG RE SUPP: 650.0000 mg | Freq: Four times a day (QID) | RECTAL | Status: DC | PRN

## 2016-04-02 MED ORDER — ONDANSETRON HCL 4 MG/2ML IJ SOLN
4.0000 mg | Freq: Once | INTRAMUSCULAR | Status: AC
  Administered 2016-04-02: 4 mg via INTRAVENOUS
  Filled 2016-04-02: qty 2

## 2016-04-02 MED ORDER — FENTANYL CITRATE (PF) 100 MCG/2ML IJ SOLN
INTRAMUSCULAR | Status: AC
  Administered 2016-04-02 (×2): 25 ug
  Filled 2016-04-02: qty 2

## 2016-04-02 MED ORDER — DILTIAZEM HCL 25 MG/5ML IV SOLN
10.0000 mg | Freq: Once | INTRAVENOUS | Status: AC
  Administered 2016-04-02: 10 mg via INTRAVENOUS
  Filled 2016-04-02: qty 5

## 2016-04-02 MED ORDER — METOPROLOL TARTRATE 5 MG/5ML IV SOLN
5.0000 mg | Freq: Once | INTRAVENOUS | Status: AC
  Administered 2016-04-02: 5 mg via INTRAVENOUS
  Filled 2016-04-02: qty 5

## 2016-04-02 MED ORDER — INSULIN ASPART 100 UNIT/ML ~~LOC~~ SOLN
0.0000 [IU] | SUBCUTANEOUS | Status: DC
  Administered 2016-04-02: 3 [IU] via SUBCUTANEOUS

## 2016-04-02 MED ORDER — DEXTROSE 5 % IV SOLN
1.0000 g | INTRAVENOUS | Status: DC
  Administered 2016-04-02: 1 g via INTRAVENOUS
  Filled 2016-04-02: qty 10

## 2016-04-02 MED ORDER — MORPHINE SULFATE (PF) 2 MG/ML IV SOLN
2.0000 mg | INTRAVENOUS | Status: DC | PRN
  Administered 2016-04-02: 2 mg via INTRAVENOUS
  Filled 2016-04-02: qty 1

## 2016-04-02 MED ORDER — METOPROLOL TARTRATE 5 MG/5ML IV SOLN
INTRAVENOUS | Status: AC
Start: 2016-04-02 — End: 2016-04-02
  Filled 2016-04-02: qty 5

## 2016-04-02 MED ORDER — LIDOCAINE HCL 1 % IJ SOLN
INTRAMUSCULAR | Status: AC
  Filled 2016-04-02: qty 20

## 2016-04-02 MED ORDER — LIDOCAINE HCL 1 % IJ SOLN
INTRAMUSCULAR | Status: DC | PRN
  Administered 2016-04-02: 10 mL

## 2016-04-02 MED ORDER — DILTIAZEM HCL 25 MG/5ML IV SOLN: 15.0000 mg | Freq: Once | INTRAVENOUS | Status: DC

## 2016-04-02 NOTE — ED Notes (Signed)
PA at bedside.

## 2016-04-02 NOTE — ED Notes (Signed)
Pt denies any nausea at this time.

## 2016-04-02 NOTE — Consult Note (Signed)
Urology Consult  Referring physician:  Dr. Edd Arbour                                   Dr. Johnsie Kindred Reason for referral:   7m Left UPJ stone, sepsis  Impression/Assessment:    75yo female with  Several week history of abdominal pain, nausea, and vomiting. She was seen in emergency room for left upper quadrant pain today, with CT scan showing left hydronephrosis, accompanied by 8 mm left ureteropelvic junction stone. In addition, she has a white blood cell count of 20,000, urinalysis with too numerous count white blood cells plus bacteria, and EKG showing atrial fibrillation with rapid ventricular response; elevated troponin, and creatinine of 2.6 (was 1.3 in 2008).    Audrey have reviewed the CT with both Dr. LRex Kras as well as with interventional radiology. Audrey believe the patient is a poor anesthetic risk, but a high sepsis risk, and needs relief of renal obstruction. She will have percutaneous nephrostomy tube placed today. She is admitted to internal medicine, and will have relief of her atrial fibrillation with rapid ventricular response, and treatment for her acute kidney disease. Once recovered, we will again address her renal pelvic stone.  Plan:  Percutaneous nephrostomy tube placement, IV antibiotics, resuscitation with fluids, per internal medicine. She will have atrial fibrillation with rapid ventricular response treated by internal medicine, as well as acute kidney disease treated by internal medicine. Antibiotic will be per internal medicine as well.  History of Present Illness:   Audrey Mullen a 75year old female, with several week history of abdominal pain, presenting to the emergency room today with left upper quadrant pain, nausea, vomiting. CT scan shows left ureteropelvic junction stone, with hydronephrosis and stranding. She had white blood cell count of 20,000. Urinalysis shows too numerous to count white blood cells, with bacteria. In addition, the patient has an elevated  creatinine of 2.6, from a normal value of 1.3 and 2008. She has atrial fibrillation, with rapid ventricular response, which is apparently a new finding. She has been cared for by Dr. VJohnsie Kindredin the community. She has elevated troponins in the emergency room.  Past Medical History  Diagnosis Date  . [place]   . Arthritis   . Hyperlipidemia    Past Surgical History  Procedure Laterality Date  . Rotator cuff repair      Medications:  Prior to Admission medications   Medication Sig Start Date End Date Taking? Authorizing Provider  amoxicillin (AMOXIL) 875 MG tablet Take 1 tablet (875 mg total) by mouth 2 (two) times daily. 08/13/13   SDarlyne Russian MD  fluticasone (FLONASE) 50 MCG/ACT nasal spray Place 2 sprays into the nose daily. 08/13/13   SDarlyne Russian MD  labetalol (NORMODYNE) 200 MG tablet Take 200 mg by mouth 2 (two) times daily.    Historical Provider, MD  rosuvastatin (CRESTOR) 20 MG tablet Take 20 mg by mouth daily.    Historical Provider, MD         Allergies: No Known Allergies  No family history on file.  Social History:  reports that she has never smoked. She does not have any smokeless tobacco history on file. She reports that she does not drink alcohol or use illicit drugs.  ROS denies chills or fever shortness of breath cough wheezing flank pain, diarrhea gross hematuria or shortness of breath.  Physical  Exam:  Vital signs in last 24 hours: Temp:  [98.1 F (36.7 C)] 98.1 F (36.7 C) (05/16 1116) Pulse Rate:  [64-160] 121 (05/16 1311) Resp:  [14-31] 31 (05/16 1311) BP: (119-144)/(64-114) 129/64 mmHg (05/16 1311) SpO2:  [91 %-98 %] 97 % (05/16 1311) Weight:  [79.379 kg (175 lb)] 79.379 kg (175 lb) (05/16 0757) Physical Exam Constitutional: She is oriented to person, place, and time. She appears well-developed and well-nourished. No distress.  HENT:  Head: Normocephalic and atraumatic.   Eyes: Conjunctivae are normal. Pupils are equal, round, and reactive to light. Right eye exhibits no discharge. Left eye exhibits no discharge. No scleral icterus.  Neck: Normal range of motion.  Cardiovascular: Intact distal pulses. An irregularly irregular rhythm present. Tachycardia present. Exam reveals no gallop and no friction rub.  No murmur heard. Pulmonary/Chest: Effort normal and breath sounds normal. No respiratory distress. She has no wheezes. She has no rales. She exhibits no tenderness.  Abdominal: Soft. Bowel sounds are normal. She exhibits no distension and no mass. There is mild Left upper quadrant tenderness. There is no rebound and no guarding.   LUQ tenderness  Neurological: She is alert and oriented to person, place, and time.  Skin: Skin is warm and dry.  Psychiatric: She has a normal mood and affect.   Laboratory Data:  Results for orders placed or performed during the hospital encounter of 04/02/16 (from the past 72 hour(s))  Urinalysis, Routine w reflex microscopic     Status: Abnormal   Collection Time: 04/02/16  8:24 AM  Result Value Ref Range   Color, Urine YELLOW YELLOW   APPearance CLOUDY (A) CLEAR   Specific Gravity, Urine 1.019 1.005 - 1.030   pH 6.0 5.0 - 8.0   Glucose, UA NEGATIVE NEGATIVE mg/dL   Hgb urine dipstick MODERATE (A) NEGATIVE   Bilirubin Urine NEGATIVE NEGATIVE   Ketones, ur NEGATIVE NEGATIVE mg/dL   Protein, ur 100 (A) NEGATIVE mg/dL   Nitrite NEGATIVE NEGATIVE   Leukocytes, UA MODERATE (A) NEGATIVE  Urine microscopic-add on     Status: Abnormal   Collection Time: 04/02/16  8:24 AM  Result Value Ref Range   Squamous Epithelial / LPF 6-30 (A) NONE SEEN   WBC, UA TOO NUMEROUS TO COUNT 0 - 5 WBC/hpf   RBC / HPF 0-5 0 - 5 RBC/hpf   Bacteria, UA MANY (A) NONE SEEN  Comprehensive metabolic panel     Status: Abnormal   Collection Time: 04/02/16  8:30 AM  Result Value Ref Range   Sodium 136 135 - 145 mmol/L   Potassium 4.1 3.5 - 5.1  mmol/L   Chloride 100 (L) 101 - 111 mmol/L   CO2 24 22 - 32 mmol/L   Glucose, Bld 200 (H) 65 - 99 mg/dL   BUN 45 (H) 6 - 20 mg/dL   Creatinine, Ser 2.62 (H) 0.44 - 1.00 mg/dL   Calcium 9.1 8.9 - 10.3 mg/dL   Total Protein 7.5 6.5 - 8.1 g/dL   Albumin 3.9 3.5 - 5.0 g/dL   AST 36 15 - 41 U/L   ALT 19 14 - 54 U/L   Alkaline Phosphatase 77 38 - 126 U/L   Total Bilirubin 1.0 0.3 - 1.2 mg/dL   GFR calc non Af Amer 17 (L) >60 mL/min   GFR calc Af Amer 19 (L) >60 mL/min    Comment: (NOTE) The eGFR has been calculated using the CKD EPI equation. This calculation has not been validated in all clinical  situations. eGFR's persistently <60 mL/min signify possible Chronic Kidney Disease.    Anion gap 12 5 - 15  Lipase, blood     Status: None   Collection Time: 04/02/16  8:30 AM  Result Value Ref Range   Lipase 19 11 - 51 U/L  CBC with Differential     Status: Abnormal   Collection Time: 04/02/16  8:30 AM  Result Value Ref Range   WBC 20.5 (H) 4.0 - 10.5 K/uL   RBC 4.55 3.87 - 5.11 MIL/uL   Hemoglobin 12.6 12.0 - 15.0 g/dL   HCT 38.3 36.0 - 46.0 %   MCV 84.2 78.0 - 100.0 fL   MCH 27.7 26.0 - 34.0 pg   MCHC 32.9 30.0 - 36.0 g/dL   RDW 14.3 11.5 - 15.5 %   Platelets 144 (L) 150 - 400 K/uL   Neutrophils Relative % 93 %   Lymphocytes Relative 4 %   Monocytes Relative 3 %   Eosinophils Relative 0 %   Basophils Relative 0 %   Neutro Abs 19.1 (H) 1.7 - 7.7 K/uL   Lymphs Abs 0.8 0.7 - 4.0 K/uL   Monocytes Absolute 0.6 0.1 - 1.0 K/uL   Eosinophils Absolute 0.0 0.0 - 0.7 K/uL   Basophils Absolute 0.0 0.0 - 0.1 K/uL   WBC Morphology DOHLE BODIES     Comment: VACUOLATED NEUTROPHILS   Smear Review LARGE PLATELETS PRESENT   Audrey-stat troponin, ED     Status: Abnormal   Collection Time: 04/02/16  8:35 AM  Result Value Ref Range   Troponin Audrey, poc 0.15 (HH) 0.00 - 0.08 ng/mL   Comment NOTIFIED PHYSICIAN    Comment 3            Comment: Due to the release kinetics of cTnI, a negative result  within the first hours of the onset of symptoms does not rule out myocardial infarction with certainty. If myocardial infarction is still suspected, repeat the test at appropriate intervals.   Audrey-Stat CG4 Lactic Acid, ED     Status: Abnormal   Collection Time: 04/02/16  8:37 AM  Result Value Ref Range   Lactic Acid, Venous 2.31 (HH) 0.5 - 2.0 mmol/L   Comment NOTIFIED PHYSICIAN   Audrey-stat troponin, ED     Status: Abnormal   Collection Time: 04/02/16 12:01 PM  Result Value Ref Range   Troponin Audrey, poc 0.25 (HH) 0.00 - 0.08 ng/mL   Comment NOTIFIED PHYSICIAN    Comment 3            Comment: Due to the release kinetics of cTnI, a negative result within the first hours of the onset of symptoms does not rule out myocardial infarction with certainty. If myocardial infarction is still suspected, repeat the test at appropriate intervals.   Audrey-Stat CG4 Lactic Acid, ED     Status: Abnormal   Collection Time: 04/02/16 12:03 PM  Result Value Ref Range   Lactic Acid, Venous 2.08 (HH) 0.5 - 2.0 mmol/L   Comment NOTIFIED PHYSICIAN   Protime-INR     Status: None   Collection Time: 04/02/16 12:39 PM  Result Value Ref Range   Prothrombin Time 14.8 11.6 - 15.2 seconds   INR 1.14 0.00 - 1.49  APTT     Status: None   Collection Time: 04/02/16 12:39 PM  Result Value Ref Range   aPTT 27 24 - 37 seconds   No results found for this or any previous visit (from the past 240 hour(s)). Creatinine:  Recent Labs  04/02/16 0830  CREATININE 2.62*   Baseline Creatinine:   1.8   CLINICAL DATA: Left lower quadrant pain, epigastric pain and nausea starting Sunday  EXAM: CT ABDOMEN AND PELVIS WITHOUT CONTRAST  TECHNIQUE: Multidetector CT imaging of the abdomen and pelvis was performed following the standard protocol without IV contrast.  COMPARISON: 02/03/2007  FINDINGS: Lower chest: Lung bases shows no infiltrate or pulmonary edema. Mild bronchitic changes are noted bilateral lower  lobe.  Hepatobiliary: The unenhanced liver shows no biliary ductal dilatation. No calcified gallstones are noted within gallbladder. No CBD dilatation.  Pancreas: The unenhanced pancreas is unremarkable.  Spleen: Unenhanced spleen is unremarkable.  Adrenals/Urinary Tract: Bilateral adrenal adenomas are stable. On the right side measures 2.5 cm. Left adrenal adenoma measures 1.6 cm. There is significant left perinephric stranding. Moderate left hydronephrosis. Stable cortical scarring and small calculi in upper pole of the left kidney. Axial image 36 there is 8 mm calcified obstructive calculus in left UPJ.  Nonobstructive calculus in midpole of the left kidney posterior aspect measures 4 mm. No right nephrolithiasis. No right hydronephrosis or hydroureter. Bilateral distal ureter is small caliber. There is a probable cyst in lower pole posterior aspect of the right kidney measures 1.8 cm.  No calcified calculi are noted within urinary bladder.  Stomach/Bowel: Small hiatal hernia. No gastric outlet obstruction. No small bowel obstruction. Oral contrast material was given to the patient. Terminal ileum is unremarkable. No pericecal inflammation. Normal appendix is noted in axial image 52. No distal colonic obstruction. Few colonic diverticula are noted descending colon and proximal sigmoid colon. No evidence of colitis or diverticulitis.  Vascular/Lymphatic: Atherosclerotic calcifications of abdominal aorta and iliac arteries. No aortic aneurysm. No retroperitoneal or mesenteric adenopathy.  Reproductive: Unenhanced uterus is atrophic. No adnexal mass.  Other: No ascites or free air. There is a tiny umbilical hernia containing fat without evidence of acute complication. Small right inguinal canal hernia containing fat measures 2 cm without evidence of acute complication.  Musculoskeletal: No destructive bony lesions are noted. Mild levoscoliosis lumbar spine.  Sagittal images of the spine shows significant degenerative changes lumbar spine and lower thoracic spine. Multilevel vacuum disc phenomenon anterior and posterior spurring noted lower thoracic and lumbar spine.  IMPRESSION: 1. There is moderate left hydronephrosis. Mild left perinephric stranding. 2. Axial image 36 there is 8 mm calcified obstructive calculus in left UPJ. Nonobstructive calculus in mid to lower pole of the left kidney posterior aspect measures 4 mm. Again noted significant scarring and small cortical calcifications in upper pole of the left kidney. 3. No right nephrolithiasis. Probable cyst in lower pole of the right kidney measures 1.9 cm. Bilateral distal ureter is unremarkable. No right hydronephrosis or hydroureter. 4. Normal appendix. No pericecal inflammation. 5. Extensive degenerative changes thoracolumbar spine. 6. No calcified calculi are noted within urinary bladder. 7. Atrophic uterus. 8. Stable bilateral adrenal gland adenomas.   Electronically Signed  By: Lahoma Crocker M.D.  On: 04/02/2016 11:02     Audrey Mullen Audrey Mullen 04/02/2016, 1:12 PM

## 2016-04-02 NOTE — ED Notes (Signed)
Per pt report: pt reports having "something in my throat", a headache, and abd pain.  Pt reports having white mucus come up.  Pt reports she was vomiting "green stuff" yesterday.  Pt a/o x 4, skin warm and dry, and able to talk in complete sentences.

## 2016-04-02 NOTE — Procedures (Signed)
Successful LT PCN INSERTION NO COMP STABLE PURULENT URINE ASPIRATED GS/CX SENT FULL REPORT IN PACS

## 2016-04-02 NOTE — H&P (Signed)
History and Physical    Audrey Mullen [id number] DOB: Sep 13, 1941 DOA: 04/02/2016  PCP: Elyn Peers, MD  Patient coming from: Home  Chief Complaint: Nausea/vomiting lash left flank pain  HPI: Audrey Mullen is a 75 y.o. female with medical history significant of diabetes mellitus, also arthritis, dyslipidemia, presented to the emergency department with complaints of nausea vomiting associate with left flank pain. She states symptoms started on Sunday where she developed multiple episodes of nausea vomiting, bringing up green emesis. This was associate with left flank pain, subjective fevers, chills, malaise, generalized weakness, fatigue, feeling ill. She also complains of foul-smelling urine although has not had dysuria. Prior to this she reports that she had been doing well, resides at home with her husband and is independent on all activities of daily living.   ED Course: In the emergency room she was found to be in atrial fibrillation with rapid ventricular response and initially treated with IV Cardizem. Lab work revealed elevated lactate of 2.3, white count of 20,500 with CT scan of abdomen and pelvis showing left-sided moderate hydronephrosis. I spoke with Dr. Gaynelle Arabian of urology from the emergency department who felt she would be a better candidate for interventional radiology consultation for percutaneous drainage catheter. Better candidate for a percutaneous drainage catheter placement.   Review of Systems: As per HPI otherwise 10 point review of systems negative.    Past Medical History  Diagnosis Date  . Diabetes mellitus without complication ([place])   . Arthritis   . Hyperlipidemia     Past Surgical History  Procedure Laterality Date  . Rotator cuff repair       reports that she has never smoked. She does not have any smokeless tobacco history on file. She reports that she does not drink alcohol or use illicit drugs.  No Known Allergies  Family  history  Family history reviewed and not pertinent   Prior to Admission medications   Medication Sig Start Date End Date Taking? Authorizing Provider  rosuvastatin (CRESTOR) 20 MG tablet Take 20 mg by mouth daily.   Yes Historical Provider, MD  spironolactone-hydrochlorothiazide (ALDACTAZIDE) 25-25 MG tablet Take 1 tablet by mouth daily. 03/23/16  Yes Historical Provider, MD  fluticasone (FLONASE) 50 MCG/ACT nasal spray Place 2 sprays into the nose daily. Patient not taking: Reported on 04/02/2016 08/13/13   Darlyne Russian, MD    Physical Exam: Filed Vitals:   04/02/16 1030 04/02/16 1116 04/02/16 1130 04/02/16 1200  BP: 144/87 119/80 139/86 144/84  Pulse: 115 120 116 112  Temp:  98.1 F (36.7 C)    TempSrc:  Oral    Resp: 26 28 18 21   Height:      Weight:      SpO2: 93% 94% 94% 91%   ConstitutionalIll-appearing although no acute distress  Filed Vitals:   04/02/16 1030 04/02/16 1116 04/02/16 1130 04/02/16 1200  BP: 144/87 119/80 139/86 144/84  Pulse: 115 120 116 112  Temp:  98.1 F (36.7 C)    TempSrc:  Oral    Resp: 26 28 18 21   Height:      Weight:      SpO2: 93% 94% 94% 91%   Eyes: PERRL, lids and conjunctivae normal ENMT: Mucous membranes are moist. Posterior pharynx clear of any exudate or lesions.Normal dentition. Dry oral mucosa  Neck: normal, supple, no masses, no thyromegaly Respiratory: clear to auscultation bilaterally, no wheezing, no crackles. Normal respiratory effort. No accessory muscle use.  Cardiovascular: Regular rate and rhythm, no  murmurs / rubs / gallops. No extremity edema. 2+ pedal pulses. No carotid bruits.  Abdomen:  she has tenderness over the left flank area,  there are no palpable masses, positive bowel sounds in all 4 quadrants, no rebound or guarding  Skin: no rashes, lesions, ulcers. No induration Neurologic: CN 2-12 grossly intact. Sensation intact, DTR normal. Strength 5/5 in all 4.  Psychiatric: Normal judgment and insight. Alert and oriented  x 3. Normal mood.   Labs on Admission: I have personally reviewed following labs and imaging studies  CBC:  Recent Labs Lab 04/02/16 0830  WBC 20.5*  NEUTROABS 19.1*  HGB 12.6  HCT 38.3  MCV 84.2  PLT 123456*   Basic Metabolic Panel:  Recent Labs Lab 04/02/16 0830  NA 136  K 4.1  CL 100*  CO2 24  GLUCOSE 200*  BUN 45*  CREATININE 2.62*  CALCIUM 9.1   GFR: Estimated Creatinine Clearance: 18.3 mL/min (by C-G formula based on Cr of 2.62). Liver Function Tests:  Recent Labs Lab 04/02/16 0830  AST 36  ALT 19  ALKPHOS 77  BILITOT 1.0  PROT 7.5  ALBUMIN 3.9    Recent Labs Lab 04/02/16 0830  LIPASE 19   No results for input(s): AMMONIA in the last 168 hours. Coagulation Profile: No results for input(s): INR, PROTIME in the last 168 hours. Cardiac Enzymes: No results for input(s): CKTOTAL, CKMB, CKMBINDEX, TROPONINI in the last 168 hours. BNP (last 3 results) No results for input(s): PROBNP in the last 8760 hours. HbA1C: No results for input(s): HGBA1C in the last 72 hours. CBG: No results for input(s): GLUCAP in the last 168 hours. Lipid Profile: No results for input(s): CHOL, HDL, LDLCALC, TRIG, CHOLHDL, LDLDIRECT in the last 72 hours. Thyroid Function Tests: No results for input(s): TSH, T4TOTAL, FREET4, T3FREE, THYROIDAB in the last 72 hours. Anemia Panel: No results for input(s): VITAMINB12, FOLATE, FERRITIN, TIBC, IRON, RETICCTPCT in the last 72 hours. Urine analysis:    Component Value Date/Time   COLORURINE YELLOW 04/02/2016 0824   APPEARANCEUR CLOUDY* 04/02/2016 0824   LABSPEC 1.019 04/02/2016 0824   PHURINE 6.0 04/02/2016 0824   GLUCOSEU NEGATIVE 04/02/2016 0824   HGBUR MODERATE* 04/02/2016 0824   BILIRUBINUR NEGATIVE 04/02/2016 0824   KETONESUR NEGATIVE 04/02/2016 0824   PROTEINUR 100* 04/02/2016 0824   UROBILINOGEN 0.2 08/18/2007 1126   NITRITE NEGATIVE 04/02/2016 0824   LEUKOCYTESUR MODERATE* 04/02/2016 0824   Sepsis Labs:  !!!!!!!!!!!!!!!!!!!!!!!!!!!!!!!!!!!!!!!!!!!! @LABRCNTIP (procalcitonin:4,lacticidven:4) )No results found for this or any previous visit (from the past 240 hour(s)).   Radiological Exams on Admission: Ct Abdomen Pelvis Wo Contrast  04/02/2016  CLINICAL DATA:  Left lower quadrant pain, epigastric pain and nausea starting Sunday EXAM: CT ABDOMEN AND PELVIS WITHOUT CONTRAST TECHNIQUE: Multidetector CT imaging of the abdomen and pelvis was performed following the standard protocol without IV contrast. COMPARISON:  02/03/2007 FINDINGS: Lower chest: Lung bases shows no infiltrate or pulmonary edema. Mild bronchitic changes are noted bilateral lower lobe. Hepatobiliary: The unenhanced liver shows no biliary ductal dilatation. No calcified gallstones are noted within gallbladder. No CBD dilatation. Pancreas: The unenhanced pancreas is unremarkable. Spleen: Unenhanced spleen is unremarkable. Adrenals/Urinary Tract: Bilateral adrenal adenomas are stable. On the right side measures 2.5 cm. Left adrenal adenoma measures 1.6 cm. There is significant left perinephric stranding. Moderate left hydronephrosis. Stable cortical scarring and small calculi in upper pole of the left kidney. Axial image 36 there is 8 mm calcified obstructive calculus in left UPJ. Nonobstructive calculus in midpole of the  left kidney posterior aspect measures 4 mm. No right nephrolithiasis. No right hydronephrosis or hydroureter. Bilateral distal ureter is small caliber. There is a probable cyst in lower pole posterior aspect of the right kidney measures 1.8 cm. No calcified calculi are noted within urinary bladder. Stomach/Bowel: Small hiatal hernia. No gastric outlet obstruction. No small bowel obstruction. Oral contrast material was given to the patient. Terminal ileum is unremarkable. No pericecal inflammation. Normal appendix is noted in axial image 52. No distal colonic obstruction. Few colonic diverticula are noted descending colon and proximal  sigmoid colon. No evidence of colitis or diverticulitis. Vascular/Lymphatic: Atherosclerotic calcifications of abdominal aorta and iliac arteries. No aortic aneurysm. No retroperitoneal or mesenteric adenopathy. Reproductive: Unenhanced uterus is atrophic.  No adnexal mass. Other: No ascites or free air. There is a tiny umbilical hernia containing fat without evidence of acute complication. Small right inguinal canal hernia containing fat measures 2 cm without evidence of acute complication. Musculoskeletal: No destructive bony lesions are noted. Mild levoscoliosis lumbar spine. Sagittal images of the spine shows significant degenerative changes lumbar spine and lower thoracic spine. Multilevel vacuum disc phenomenon anterior and posterior spurring noted lower thoracic and lumbar spine. IMPRESSION: 1. There is moderate left hydronephrosis. Mild left perinephric stranding. 2. Axial image 36 there is 8 mm calcified obstructive calculus in left UPJ. Nonobstructive calculus in mid to lower pole of the left kidney posterior aspect measures 4 mm. Again noted significant scarring and small cortical calcifications in upper pole of the left kidney. 3. No right nephrolithiasis. Probable cyst in lower pole of the right kidney measures 1.9 cm. Bilateral distal ureter is unremarkable. No right hydronephrosis or hydroureter. 4. Normal appendix.  No pericecal inflammation. 5. Extensive degenerative changes thoracolumbar spine. 6. No calcified calculi are noted within urinary bladder. 7. Atrophic uterus. 8. Stable bilateral adrenal gland adenomas. Electronically Signed   By: Lahoma Crocker M.D.   On: 04/02/2016 11:02   Dg Chest 2 View  04/02/2016  CLINICAL DATA:  Cough, congestion, shortness of breath for 3 days EXAM: CHEST  2 VIEW COMPARISON:  01/31/2005 FINDINGS: Cardiomediastinal silhouette is stable. No infiltrate or pleural effusion. No pulmonary edema. Mild perihilar bronchitic changes. Mild degenerative changes thoracic  spine. IMPRESSION: No infiltrate or pulmonary edema. Mild perihilar bronchitic changes. Degenerative changes thoracic spine. Electronically Signed   By: Lahoma Crocker M.D.   On: 04/02/2016 09:13    EKG: Independently reviewed.   Assessment/Plan Principal Problem:   Sepsis ([place]) Active Problems:   AKI (acute kidney injury) ([place])   Pyelonephritis, acute   Atrial fibrillation with RVR ([place])   1.  Sepsis. Present on admission, evidenced by a white count of 20,500, creatinine of 2.62, troponin of 0.25, lactic acid of 2.3, heart rates in the 160s. Source of infection likely from pyelonephritis in setting of obstructive uropathy. Will place her on the sepsis protocol follow repeat lactic acid level, obtain blood cultures 2 sites, urine cultures, start empiric IV antibiotic therapy with ceftriaxone 2 g IV every 24 hours. She will be admitted to the stepdown unit for close monitoring.  2.  Acute pyelonephritis/obstructive uropathy. She presents septic, having left leg pain and nausea and vomiting workup in the emergency room included a CT scan that showed an 8 mm calcified obstructive calculus in the left UPJ. This was associated with moderate left hydronephrosis. As mentioned above initiating empiric IV antibiotic therapy with ceftriaxone 2 g IV every 24 hours. In the emergency department I spoke with Dr. Gaynelle Arabian of urology,  who will likely have interventional radiology place a percutaneous drainage catheter.  3.  Acute kidney injury. She presents with a creatinine of 2.62, BUN of 45, likely secondary to sepsis and obstructive uropathy. IR consulted by urology for drainage catheter. Will provide IV fluid resuscitation with normal saline running at 100 mL/hour. She had been on spironolactone at home which will be discontinued. Repeat labs in a.m.  4.  Elevated troponins. Patient having troponin of 0.25, suspect demand ischemia from critical illness and A. fib with RVR. She denies chest pain or shortness  of breath. EKG did not reveal ischemic changes. She was in A. fib with RVR. Plan to cycle troponins. Will obtain transthoracic echocardiogram.  5.  Atrial fibrillation with rapid ventricular response. New diagnosis. I suspect precipitated by sepsis/critical illness. She presented with ventricular rates in the 160s, improved after the administration of IV Cardizem. Will place her on continuous cardiac monitoring, check a TSH, continue oral Cardizem at 120 mg by mouth daily. Obtain transthoracic echocardiogram.  6. Type 2 diabetes mellitus. Patient will be Nothing by mouth for possible procedure, will provide Accu-Cheks every 4 hours with sliding scale coverage.   DVT prophylaxis:Subcutaneous heparin Cod tatus: Full code Family communication:  Disposition Plan: Anticipate she will require greater than 2 nights hospitalization  Consults called: Urology, Dr. Gaynelle Arabian  Admission status: Stepdown unit   Kelvin Cellar MD [facility] Pager [phone]   If 7PM-7AM, please contact night-coverage [email or web address] Password [facility]  04/02/2016, 12:55 PM

## 2016-04-02 NOTE — Consult Note (Signed)
Chief Complaint: left hydronephrosis  Referring Physician: Dr. Carolan Clines  Supervising Physician: Arne Cleveland  Patient Status: In-pt   HPI: Audrey Mullen is an 75 y.o. female with a history of nephrolithiasis who has had nausea and vomiting with left sided flank pain.  She presented to the [facility] where she underwent a CT scan that revealed moderated left hydronephrosis secondary to a UPJ obstruction from a stone.  Urology has evaluated her and secondary to her sepsis, we have been asked to place a PCN for relief of her current obstruction.  Past Medical History:  Past Medical History  Diagnosis Date  . Diabetes mellitus without complication ([place])   . Arthritis   . Hyperlipidemia     Past Surgical History:  Past Surgical History  Procedure Laterality Date  . Rotator cuff repair      Family History: History reviewed. No pertinent family history.  Social History:  reports that she has never smoked. She does not have any smokeless tobacco history on file. She reports that she does not drink alcohol or use illicit drugs.  Allergies: No Known Allergies  Medications:   Medication List    ASK your doctor about these medications        fluticasone 50 MCG/ACT nasal spray  Commonly known as:  FLONASE  Place 2 sprays into the nose daily.     rosuvastatin 20 MG tablet  Commonly known as:  CRESTOR  Take 20 mg by mouth daily.     spironolactone-hydrochlorothiazide 25-25 MG tablet  Commonly known as:  ALDACTAZIDE  Take 1 tablet by mouth daily.        Please HPI for pertinent positives, otherwise complete 10 system ROS negative.  Mallampati Score: MD Evaluation Airway: WNL Heart: WNL Abdomen: WNL Chest/ Lungs: WNL ASA  Classification: 2 Mallampati/Airway Score: One  Physical Exam: BP 129/64 mmHg  Pulse 121  Temp(Src) 98.1 F (36.7 C) (Oral)  Resp 31  Ht _0  (1.626 m)  Wt 175 lb (79.379 kg)  BMI 30.02 kg/m2  SpO2 97% Body mass index is  30.02 kg/(m^2). General: pleasant, obese black female who is laying in bed in NAD HEENT: head is normocephalic, atraumatic.  Sclera are noninjected.  PERRL.  Ears and nose without any masses or lesions.  Mouth is pink and moist Heart: irregularly irregular, tachy.  Normal s1,s2. No obvious murmurs, gallops, or rubs noted.  Palpable radial and pedal pulses bilaterally Lungs: CTAB, no wheezes, rhonchi, or rales noted.  Respiratory effort nonlabored Abd: soft, NT, except on her left lateral flank.  No CVA tenderness, ND, +BS, no masses, hernias, or organomegaly Psych: A&Ox3 with an appropriate affect.   Labs: Results for orders placed or performed during the hospital encounter of 04/02/16 (from the past 48 hour(s))  Urinalysis, Routine w reflex microscopic     Status: Abnormal   Collection Time: 04/02/16  8:24 AM  Result Value Ref Range   Color, Urine YELLOW YELLOW   APPearance CLOUDY (A) CLEAR   Specific Gravity, Urine 1.019 1.005 - 1.030   pH 6.0 5.0 - 8.0   Glucose, UA NEGATIVE NEGATIVE mg/dL   Hgb urine dipstick MODERATE (A) NEGATIVE   Bilirubin Urine NEGATIVE NEGATIVE   Ketones, ur NEGATIVE NEGATIVE mg/dL   Protein, ur 100 (A) NEGATIVE mg/dL   Nitrite NEGATIVE NEGATIVE   Leukocytes, UA MODERATE (A) NEGATIVE  Urine microscopic-add on     Status: Abnormal   Collection Time: 04/02/16  8:24 AM  Result Value Ref  Range   Squamous Epithelial / LPF 6-30 (A) NONE SEEN   WBC, UA TOO NUMEROUS TO COUNT 0 - 5 WBC/hpf   RBC / HPF 0-5 0 - 5 RBC/hpf   Bacteria, UA MANY (A) NONE SEEN  Comprehensive metabolic panel     Status: Abnormal   Collection Time: 04/02/16  8:30 AM  Result Value Ref Range   Sodium 136 135 - 145 mmol/L   Potassium 4.1 3.5 - 5.1 mmol/L   Chloride 100 (L) 101 - 111 mmol/L   CO2 24 22 - 32 mmol/L   Glucose, Bld 200 (H) 65 - 99 mg/dL   BUN 45 (H) 6 - 20 mg/dL   Creatinine, Ser 2.62 (H) 0.44 - 1.00 mg/dL   Calcium 9.1 8.9 - 10.3 mg/dL   Total Protein 7.5 6.5 - 8.1 g/dL    Albumin 3.9 3.5 - 5.0 g/dL   AST 36 15 - 41 U/L   ALT 19 14 - 54 U/L   Alkaline Phosphatase 77 38 - 126 U/L   Total Bilirubin 1.0 0.3 - 1.2 mg/dL   GFR calc non Af Amer 17 (L) >60 mL/min   GFR calc Af Amer 19 (L) >60 mL/min    Comment: (NOTE) The eGFR has been calculated using the CKD EPI equation. This calculation has not been validated in all clinical situations. eGFR's persistently <60 mL/min signify possible Chronic Kidney Disease.    Anion gap 12 5 - 15  Lipase, blood     Status: None   Collection Time: 04/02/16  8:30 AM  Result Value Ref Range   Lipase 19 11 - 51 U/L  CBC with Differential     Status: Abnormal   Collection Time: 04/02/16  8:30 AM  Result Value Ref Range   WBC 20.5 (H) 4.0 - 10.5 K/uL   RBC 4.55 3.87 - 5.11 MIL/uL   Hemoglobin 12.6 12.0 - 15.0 g/dL   HCT 38.3 36.0 - 46.0 %   MCV 84.2 78.0 - 100.0 fL   MCH 27.7 26.0 - 34.0 pg   MCHC 32.9 30.0 - 36.0 g/dL   RDW 14.3 11.5 - 15.5 %   Platelets 144 (L) 150 - 400 K/uL   Neutrophils Relative % 93 %   Lymphocytes Relative 4 %   Monocytes Relative 3 %   Eosinophils Relative 0 %   Basophils Relative 0 %   Neutro Abs 19.1 (H) 1.7 - 7.7 K/uL   Lymphs Abs 0.8 0.7 - 4.0 K/uL   Monocytes Absolute 0.6 0.1 - 1.0 K/uL   Eosinophils Absolute 0.0 0.0 - 0.7 K/uL   Basophils Absolute 0.0 0.0 - 0.1 K/uL   WBC Morphology DOHLE BODIES     Comment: VACUOLATED NEUTROPHILS   Smear Review LARGE PLATELETS PRESENT   I-stat troponin, ED     Status: Abnormal   Collection Time: 04/02/16  8:35 AM  Result Value Ref Range   Troponin i, poc 0.15 (HH) 0.00 - 0.08 ng/mL   Comment NOTIFIED PHYSICIAN    Comment 3            Comment: Due to the release kinetics of cTnI, a negative result within the first hours of the onset of symptoms does not rule out myocardial infarction with certainty. If myocardial infarction is still suspected, repeat the test at appropriate intervals.   I-Stat CG4 Lactic Acid, ED     Status: Abnormal    Collection Time: 04/02/16  8:37 AM  Result Value Ref Range   Lactic  Acid, Venous 2.31 (HH) 0.5 - 2.0 mmol/L   Comment NOTIFIED PHYSICIAN   I-stat troponin, ED     Status: Abnormal   Collection Time: 04/02/16 12:01 PM  Result Value Ref Range   Troponin i, poc 0.25 (HH) 0.00 - 0.08 ng/mL   Comment NOTIFIED PHYSICIAN    Comment 3            Comment: Due to the release kinetics of cTnI, a negative result within the first hours of the onset of symptoms does not rule out myocardial infarction with certainty. If myocardial infarction is still suspected, repeat the test at appropriate intervals.   I-Stat CG4 Lactic Acid, ED     Status: Abnormal   Collection Time: 04/02/16 12:03 PM  Result Value Ref Range   Lactic Acid, Venous 2.08 (HH) 0.5 - 2.0 mmol/L   Comment NOTIFIED PHYSICIAN   Protime-INR     Status: None   Collection Time: 04/02/16 12:39 PM  Result Value Ref Range   Prothrombin Time 14.8 11.6 - 15.2 seconds   INR 1.14 0.00 - 1.49  APTT     Status: None   Collection Time: 04/02/16 12:39 PM  Result Value Ref Range   aPTT 27 24 - 37 seconds    Imaging: Ct Abdomen Pelvis Wo Contrast  04/02/2016  CLINICAL DATA:  Left lower quadrant pain, epigastric pain and nausea starting Sunday EXAM: CT ABDOMEN AND PELVIS WITHOUT CONTRAST TECHNIQUE: Multidetector CT imaging of the abdomen and pelvis was performed following the standard protocol without IV contrast. COMPARISON:  02/03/2007 FINDINGS: Lower chest: Lung bases shows no infiltrate or pulmonary edema. Mild bronchitic changes are noted bilateral lower lobe. Hepatobiliary: The unenhanced liver shows no biliary ductal dilatation. No calcified gallstones are noted within gallbladder. No CBD dilatation. Pancreas: The unenhanced pancreas is unremarkable. Spleen: Unenhanced spleen is unremarkable. Adrenals/Urinary Tract: Bilateral adrenal adenomas are stable. On the right side measures 2.5 cm. Left adrenal adenoma measures 1.6 cm. There is  significant left perinephric stranding. Moderate left hydronephrosis. Stable cortical scarring and small calculi in upper pole of the left kidney. Axial image 36 there is 8 mm calcified obstructive calculus in left UPJ. Nonobstructive calculus in midpole of the left kidney posterior aspect measures 4 mm. No right nephrolithiasis. No right hydronephrosis or hydroureter. Bilateral distal ureter is small caliber. There is a probable cyst in lower pole posterior aspect of the right kidney measures 1.8 cm. No calcified calculi are noted within urinary bladder. Stomach/Bowel: Small hiatal hernia. No gastric outlet obstruction. No small bowel obstruction. Oral contrast material was given to the patient. Terminal ileum is unremarkable. No pericecal inflammation. Normal appendix is noted in axial image 52. No distal colonic obstruction. Few colonic diverticula are noted descending colon and proximal sigmoid colon. No evidence of colitis or diverticulitis. Vascular/Lymphatic: Atherosclerotic calcifications of abdominal aorta and iliac arteries. No aortic aneurysm. No retroperitoneal or mesenteric adenopathy. Reproductive: Unenhanced uterus is atrophic.  No adnexal mass. Other: No ascites or free air. There is a tiny umbilical hernia containing fat without evidence of acute complication. Small right inguinal canal hernia containing fat measures 2 cm without evidence of acute complication. Musculoskeletal: No destructive bony lesions are noted. Mild levoscoliosis lumbar spine. Sagittal images of the spine shows significant degenerative changes lumbar spine and lower thoracic spine. Multilevel vacuum disc phenomenon anterior and posterior spurring noted lower thoracic and lumbar spine. IMPRESSION: 1. There is moderate left hydronephrosis. Mild left perinephric stranding. 2. Axial image 36 there is 8 mm calcified  obstructive calculus in left UPJ. Nonobstructive calculus in mid to lower pole of the left kidney posterior aspect  measures 4 mm. Again noted significant scarring and small cortical calcifications in upper pole of the left kidney. 3. No right nephrolithiasis. Probable cyst in lower pole of the right kidney measures 1.9 cm. Bilateral distal ureter is unremarkable. No right hydronephrosis or hydroureter. 4. Normal appendix.  No pericecal inflammation. 5. Extensive degenerative changes thoracolumbar spine. 6. No calcified calculi are noted within urinary bladder. 7. Atrophic uterus. 8. Stable bilateral adrenal gland adenomas. Electronically Signed   By: Lahoma Crocker M.D.   On: 04/02/2016 11:02   Dg Chest 2 View  04/02/2016  CLINICAL DATA:  Cough, congestion, shortness of breath for 3 days EXAM: CHEST  2 VIEW COMPARISON:  01/31/2005 FINDINGS: Cardiomediastinal silhouette is stable. No infiltrate or pleural effusion. No pulmonary edema. Mild perihilar bronchitic changes. Mild degenerative changes thoracic spine. IMPRESSION: No infiltrate or pulmonary edema. Mild perihilar bronchitic changes. Degenerative changes thoracic spine. Electronically Signed   By: Lahoma Crocker M.D.   On: 04/02/2016 09:13    Assessment/Plan 1. Left sided hydronephrosis secondary to UPJ obstruction from nephrolithiasis -patient is currently in new onset A fib with RVR secondary to sepsis from this obstruction. -we will plan to place a PCN drain into her left kidney to relieve this obstruction -she is already on abx therapy, Zosyn -labs and vitals have been reviewed. -Risks and Benefits discussed with the patient including, but not limited to infection, bleeding, significant bleeding causing loss or decrease in renal function or damage to adjacent structures.  All of the patient's questions were answered, patient is agreeable to proceed. Consent signed and in chart.  Thank you for this interesting consult.  I greatly enjoyed meeting KISCHA ALTICE and look forward to participating in their care.  A copy of this report was sent to the  requesting provider on this date.  Electronically Signed: Henreitta Cea 04/02/2016, 2:17 PM   I spent a total of 40 Minutes    in face to face in clinical consultation, greater than 50% of which was counseling/coordinating care for left side hydronephrosis and obstruction

## 2016-04-02 NOTE — Progress Notes (Signed)
  Pharmacy Antibiotic Note  Audrey Mullen is a 75 y.o. female admitted on 04/02/2016 with pyelonephritis in setting of obstructive uropathy from renal pelvic stone.  Pharmacy has been consulted for ceftriaxone dosing. Leukocytosis, elevated lactic acid and AKI noted on admission labs  Plan:  Ceftriaxone 1gm IV q24h for UTI, first dose ~8h after zosyn dose given in ED  No renal dose adjustment required for ceftriaxone, pharmacy to sign-off  Height: 5\' 4"  (162.6 cm) Weight: 175 lb (79.379 kg) IBW/kg (Calculated) : 54.7  Temp (24hrs), Avg:98.1 F (36.7 C), Min:98.1 F (36.7 C), Max:98.1 F (36.7 C)   Recent Labs Lab 04/02/16 0830 04/02/16 0837 04/02/16 1203  WBC 20.5*  --   --   CREATININE 2.62*  --   --   LATICACIDVEN  --  2.31* 2.08*    Estimated Creatinine Clearance: 18.3 mL/min (by C-G formula based on Cr of 2.62).    No Known Allergies  Antimicrobials this admission: 5/16 zosyn x 1 5/16 >> ceftriaxone >>  Dose adjustments this admission:  Microbiology results: 5/16 BCx:  5/16 UCx:     Thank you for allowing pharmacy to be a part of this patient's care.  Doreene Eland, PharmD, BCPS.   Pager: [phone] 04/02/2016 2:02 PM

## 2016-04-02 NOTE — ED Notes (Signed)
Patient transported to X-ray 

## 2016-04-02 NOTE — ED Provider Notes (Signed)
CSN: [id number]     Arrival date & time 04/02/16  0745 History   First MD Initiated Contact with Patient 04/02/16 [phone]     Chief Complaint  Patient presents with  . Abdominal Pain    HPI Comments: 75 year old female presents with abdominal pain, nausea, vomiting, and weakness since Sunday. Past medical history significant for hypertension hyperlipidemia. No previous cardiac history. Her abdominal pain is in the left side of her abdomen. It's constant. She is taking over-the-counter medicine for nausea with chest has provided some relief. Reports associated headache, sore throat, urinary frequency. Denies fever, chills, chest pain, shortness of breath, cough, wheezing, flank pain, diarrhea, blood in her stool, dysuria, blood in her urine.    Past Medical History  Diagnosis Date  . Diabetes mellitus without complication ([place])   . Arthritis   . Hyperlipidemia    Past Surgical History  Procedure Laterality Date  . Rotator cuff repair     No family history on file. Social History  Substance Use Topics  . Smoking status: Never Smoker   . Smokeless tobacco: None  . Alcohol Use: No   OB History    No data available     Review of Systems  Constitutional: Negative for fever and chills.  HENT: Positive for sore throat.   Respiratory: Negative for shortness of breath.   Cardiovascular: Negative for chest pain.  Gastrointestinal: Positive for abdominal pain.  All other systems reviewed and are negative.     Allergies  Review of patient's allergies indicates no known allergies.  Home Medications   Prior to Admission medications   Medication Sig Start Date End Date Taking? Authorizing Provider  amoxicillin (AMOXIL) 875 MG tablet Take 1 tablet (875 mg total) by mouth 2 (two) times daily. 08/13/13   Darlyne Russian, MD  fluticasone (FLONASE) 50 MCG/ACT nasal spray Place 2 sprays into the nose daily. 08/13/13   Darlyne Russian, MD  labetalol (NORMODYNE) 200 MG tablet Take 200 mg by mouth 2  (two) times daily.    Historical Provider, MD  rosuvastatin (CRESTOR) 20 MG tablet Take 20 mg by mouth daily.    Historical Provider, MD   BP 134/98 mmHg  Pulse 160  Temp(Src) 98.1 F (36.7 C) (Oral)  Resp 26  Ht 5\' 4"  (1.626 m)  Wt 79.379 kg  BMI 30.02 kg/m2  SpO2 97%   Physical Exam  Constitutional: She is oriented to person, place, and time. She appears well-developed and well-nourished. No distress.  HENT:  Head: Normocephalic and atraumatic.  Eyes: Conjunctivae are normal. Pupils are equal, round, and reactive to light. Right eye exhibits no discharge. Left eye exhibits no discharge. No scleral icterus.  Neck: Normal range of motion.  Cardiovascular: Intact distal pulses.  An irregularly irregular rhythm present. Tachycardia present.  Exam reveals no gallop and no friction rub.   No murmur heard. Pulmonary/Chest: Effort normal and breath sounds normal. No respiratory distress. She has no wheezes. She has no rales. She exhibits no tenderness.  Abdominal: Soft. Bowel sounds are normal. She exhibits no distension and no mass. There is tenderness. There is no rebound and no guarding.  LLQ and LUQ tenderness  Neurological: She is alert and oriented to person, place, and time.  Skin: Skin is warm and dry.  Psychiatric: She has a normal mood and affect.    ED Course  Procedures (including critical care time) Labs Review Labs Reviewed  COMPREHENSIVE METABOLIC PANEL - Abnormal; Notable for the following:  Chloride 100 (*)    Glucose, Bld 200 (*)    BUN 45 (*)    Creatinine, Ser 2.62 (*)    GFR calc non Af Amer 17 (*)    GFR calc Af Amer 19 (*)    All other components within normal limits  CBC WITH DIFFERENTIAL/PLATELET - Abnormal; Notable for the following:    WBC 20.5 (*)    Platelets 144 (*)    Neutro Abs 19.1 (*)    All other components within normal limits  URINALYSIS, ROUTINE W REFLEX MICROSCOPIC (NOT AT [facility]) - Abnormal; Notable for the following:    APPearance  CLOUDY (*)    Hgb urine dipstick MODERATE (*)    Protein, ur 100 (*)    Leukocytes, UA MODERATE (*)    All other components within normal limits  URINE MICROSCOPIC-ADD ON - Abnormal; Notable for the following:    Squamous Epithelial / LPF 6-30 (*)    Bacteria, UA MANY (*)    All other components within normal limits  TROPONIN I - Abnormal; Notable for the following:    Troponin I 0.39 (*)    All other components within normal limits  GLUCOSE, CAPILLARY - Abnormal; Notable for the following:    Glucose-Capillary 164 (*)    All other components within normal limits  I-STAT CG4 LACTIC ACID, ED - Abnormal; Notable for the following:    Lactic Acid, Venous 2.31 (*)    All other components within normal limits  I-STAT TROPOININ, ED - Abnormal; Notable for the following:    Troponin i, poc 0.15 (*)    All other components within normal limits  I-STAT CG4 LACTIC ACID, ED - Abnormal; Notable for the following:    Lactic Acid, Venous 2.08 (*)    All other components within normal limits  I-STAT TROPOININ, ED - Abnormal; Notable for the following:    Troponin i, poc 0.25 (*)    All other components within normal limits  MRSA PCR SCREENING  URINE CULTURE  CULTURE, BLOOD (ROUTINE X 2)  CULTURE, BLOOD (ROUTINE X 2)  LIPASE, BLOOD  PROTIME-INR  APTT  PROCALCITONIN  PROTIME-INR  APTT  TSH  CBC WITH DIFFERENTIAL/PLATELET  CBC  TROPONIN I  TROPONIN I  BASIC METABOLIC PANEL  CBC  I-STAT CG4 LACTIC ACID, ED  I-STAT TROPOININ, ED    Imaging Review Ct Abdomen Pelvis Wo Contrast  04/02/2016  CLINICAL DATA:  Left lower quadrant pain, epigastric pain and nausea starting Sunday EXAM: CT ABDOMEN AND PELVIS WITHOUT CONTRAST TECHNIQUE: Multidetector CT imaging of the abdomen and pelvis was performed following the standard protocol without IV contrast. COMPARISON:  02/03/2007 FINDINGS: Lower chest: Lung bases shows no infiltrate or pulmonary edema. Mild bronchitic changes are noted bilateral  lower lobe. Hepatobiliary: The unenhanced liver shows no biliary ductal dilatation. No calcified gallstones are noted within gallbladder. No CBD dilatation. Pancreas: The unenhanced pancreas is unremarkable. Spleen: Unenhanced spleen is unremarkable. Adrenals/Urinary Tract: Bilateral adrenal adenomas are stable. On the right side measures 2.5 cm. Left adrenal adenoma measures 1.6 cm. There is significant left perinephric stranding. Moderate left hydronephrosis. Stable cortical scarring and small calculi in upper pole of the left kidney. Axial image 36 there is 8 mm calcified obstructive calculus in left UPJ. Nonobstructive calculus in midpole of the left kidney posterior aspect measures 4 mm. No right nephrolithiasis. No right hydronephrosis or hydroureter. Bilateral distal ureter is small caliber. There is a probable cyst in lower pole posterior aspect of the right kidney  measures 1.8 cm. No calcified calculi are noted within urinary bladder. Stomach/Bowel: Small hiatal hernia. No gastric outlet obstruction. No small bowel obstruction. Oral contrast material was given to the patient. Terminal ileum is unremarkable. No pericecal inflammation. Normal appendix is noted in axial image 52. No distal colonic obstruction. Few colonic diverticula are noted descending colon and proximal sigmoid colon. No evidence of colitis or diverticulitis. Vascular/Lymphatic: Atherosclerotic calcifications of abdominal aorta and iliac arteries. No aortic aneurysm. No retroperitoneal or mesenteric adenopathy. Reproductive: Unenhanced uterus is atrophic.  No adnexal mass. Other: No ascites or free air. There is a tiny umbilical hernia containing fat without evidence of acute complication. Small right inguinal canal hernia containing fat measures 2 cm without evidence of acute complication. Musculoskeletal: No destructive bony lesions are noted. Mild levoscoliosis lumbar spine. Sagittal images of the spine shows significant degenerative  changes lumbar spine and lower thoracic spine. Multilevel vacuum disc phenomenon anterior and posterior spurring noted lower thoracic and lumbar spine. IMPRESSION: 1. There is moderate left hydronephrosis. Mild left perinephric stranding. 2. Axial image 36 there is 8 mm calcified obstructive calculus in left UPJ. Nonobstructive calculus in mid to lower pole of the left kidney posterior aspect measures 4 mm. Again noted significant scarring and small cortical calcifications in upper pole of the left kidney. 3. No right nephrolithiasis. Probable cyst in lower pole of the right kidney measures 1.9 cm. Bilateral distal ureter is unremarkable. No right hydronephrosis or hydroureter. 4. Normal appendix.  No pericecal inflammation. 5. Extensive degenerative changes thoracolumbar spine. 6. No calcified calculi are noted within urinary bladder. 7. Atrophic uterus. 8. Stable bilateral adrenal gland adenomas. Electronically Signed   By: Lahoma Crocker M.D.   On: 04/02/2016 11:02   Dg Chest 2 View  04/02/2016  CLINICAL DATA:  Cough, congestion, shortness of breath for 3 days EXAM: CHEST  2 VIEW COMPARISON:  01/31/2005 FINDINGS: Cardiomediastinal silhouette is stable. No infiltrate or pleural effusion. No pulmonary edema. Mild perihilar bronchitic changes. Mild degenerative changes thoracic spine. IMPRESSION: No infiltrate or pulmonary edema. Mild perihilar bronchitic changes. Degenerative changes thoracic spine. Electronically Signed   By: Lahoma Crocker M.D.   On: 04/02/2016 09:13   I have personally reviewed and evaluated these images and lab results as part of my medical decision-making.   EKG Interpretation   Date/Time:  Tuesday Apr 02 2016 08:00:17 EDT Ventricular Rate:  156 PR Interval:    QRS Duration: 69 QT Interval:  273 QTC Calculation: 440 R Axis:   24 Text Interpretation:  Atrial fibrillation with rapid V-rate Repolarization  abnormality, prob rate related diffuse borderline ST depression a fib with  RVR  new from previous Confirmed by LITTLE MD, RACHEL [phone]) on 04/02/2016  8:19:25 AM      MDM   Final diagnoses:  Ureteral stone with hydronephrosis  Atrial fibrillation with RVR [facility])  Pyelonephritis   75 year old relatively healthy female presents for L sided abdominal pain, N/V, and weakness. She is in new onset A.fib with RVR on arrival and tachypneic. She is afebrile, normotensive, and not hypoxic. PE is remarkable for new irregularly irregular rhythm and left sided abdominal pain.   Sepsis - CBC is remarkable for leukocytosis of 20.5, otherwise unremarkable.  Initial lactic acid is 2.31, repeat is 2.08. Code Sepsis initiated. IVF started per sepsis protocol, Vanc and Zosyn started. Zofran given x2 for nausea.   AKI - CMP is remarkable for BUN/SCr of 45/2.62. No recent comparison available. Lipase normal.   Pyelonephritis -  UA remarkable for moderate Hgb, moderate leukocytes, 100 protein, many bacteria. UA cultures sent. CT scan of abdomen revealed left hydronephrosis with 4mm obstructing stone in the UPJ.   Elevated troponin - Initial Troponin elevated at .15 with repeat at .25. EKG is A.fib with RVR. No ischemic changes. CXR neg for acute processes.  Afib - Diltiazem given for A.fib.  Shared visit with Dr. Rex Kras who will consult hospitalist for admission and urology.     Recardo Evangelist, PA-C 04/02/16 [place], MD 04/03/16 [phone]

## 2016-04-02 NOTE — ED Notes (Signed)
Informed MD of I STATS

## 2016-04-02 NOTE — ED Notes (Signed)
MD made aware of I-STAT values 

## 2016-04-02 NOTE — Progress Notes (Signed)
Urology Progress Note Post L perc tube placement  Subjective: Temp max 99 now.      No acute urologic events overnight. Ambulation:   negative Flatus:    negative Bowel movement  negative  Pain: some relief. C/o pain at nehrostomy site  Objective:  Blood pressure 169/61, pulse 88, temperature 99.3 F (37.4 C), temperature source Oral, resp. rate 20, height 5\' 4"  (1.626 m), weight 73.8 kg (162 lb 11.2 oz), SpO2 96 %.  Physical Exam:  General:  No acute distress, awake Extremities: extremities normal, atraumatic, no cyanosis or edema Genitourinary:  Normal bladder  Foley: clear urine. Pus from nephrostomy tube.     I/O last 3 completed shifts: In: 302.8 [I.V.:252.8; IV Piggyback:50] Out: 250 [Urine:250]  Recent Labs     04/02/16  0830  HGB  12.6  WBC  20.5*  PLT  144*    Recent Labs     04/02/16  0830  NA  136  K  4.1  CL  100*  CO2  24  BUN  45*  CREATININE  2.62*  CALCIUM  9.1  GFRNONAA  17*  GFRAA  19*     Recent Labs     04/02/16  1239  04/02/16  1525  INR  1.14  1.13  APTT  27  28      Assessment/Plan: 8,, L: UPJ stone with obstruction, infection. Cardiac issues continue with + troponins, but no S-T segment elevation. A Flutter/Fib continues.   Continue any current medications.    C/x pus from nephrostomy tube.

## 2016-04-02 NOTE — Progress Notes (Addendum)
CRITICAL VALUE ALERT  Critical value received:  Troponin 0.84  Date of notification:  04/02/2016  Time of notification:  2153  Critical value read back:Yes.    Nurse who received alert:  Reche Dixon  MD notified (1st page):  Raliegh Ip Baltazar Najjar  Time of first page:  2155  MD notified (2nd page):  Time of second page:  Responding MD:  Tylene Fantasia   Time MD responded:  2201

## 2016-04-03 ENCOUNTER — Inpatient Hospital Stay (HOSPITAL_COMMUNITY): Payer: Medicare Other

## 2016-04-03 DIAGNOSIS — I4891 Unspecified atrial fibrillation: Secondary | ICD-10-CM

## 2016-04-03 DIAGNOSIS — N138 Other obstructive and reflux uropathy: Secondary | ICD-10-CM

## 2016-04-03 DIAGNOSIS — A419 Sepsis, unspecified organism: Secondary | ICD-10-CM

## 2016-04-03 DIAGNOSIS — N179 Acute kidney failure, unspecified: Secondary | ICD-10-CM

## 2016-04-03 DIAGNOSIS — A409 Streptococcal sepsis, unspecified: Secondary | ICD-10-CM

## 2016-04-03 DIAGNOSIS — N10 Acute tubulo-interstitial nephritis: Secondary | ICD-10-CM

## 2016-04-03 LAB — GLUCOSE, CAPILLARY
GLUCOSE-CAPILLARY: 146 mg/dL — AB (ref 65–99)
GLUCOSE-CAPILLARY: 153 mg/dL — AB (ref 65–99)
Glucose-Capillary: 131 mg/dL — ABNORMAL HIGH (ref 65–99)
Glucose-Capillary: 169 mg/dL — ABNORMAL HIGH (ref 65–99)
Glucose-Capillary: 136 mg/dL — ABNORMAL HIGH (ref 65–99)

## 2016-04-03 LAB — URINE CULTURE

## 2016-04-03 LAB — BLOOD CULTURE ID PANEL (REFLEXED)
ACINETOBACTER BAUMANNII: NOT DETECTED
CANDIDA PARAPSILOSIS: NOT DETECTED
CANDIDA TROPICALIS: NOT DETECTED
CARBAPENEM RESISTANCE: NOT DETECTED
Candida albicans: NOT DETECTED
Candida glabrata: NOT DETECTED
Candida krusei: NOT DETECTED
ENTEROBACTERIACEAE SPECIES: DETECTED — AB
ENTEROCOCCUS SPECIES: NOT DETECTED
Enterobacter cloacae complex: NOT DETECTED
Escherichia coli: NOT DETECTED
Haemophilus influenzae: NOT DETECTED
Klebsiella oxytoca: NOT DETECTED
Klebsiella pneumoniae: NOT DETECTED
Listeria monocytogenes: NOT DETECTED
Methicillin resistance: NOT DETECTED
NEISSERIA MENINGITIDIS: NOT DETECTED
PSEUDOMONAS AERUGINOSA: NOT DETECTED
Proteus species: NOT DETECTED
SERRATIA MARCESCENS: NOT DETECTED
STAPHYLOCOCCUS AUREUS BCID: NOT DETECTED
STREPTOCOCCUS AGALACTIAE: NOT DETECTED
STREPTOCOCCUS PNEUMONIAE: NOT DETECTED
STREPTOCOCCUS PYOGENES: NOT DETECTED
Staphylococcus species: NOT DETECTED
Streptococcus species: NOT DETECTED
VANCOMYCIN RESISTANCE: NOT DETECTED

## 2016-04-03 LAB — CBC
HEMATOCRIT: 32.4 % — AB (ref 36.0–46.0)
Hemoglobin: 10.2 g/dL — ABNORMAL LOW (ref 12.0–15.0)
MCH: 27 pg (ref 26.0–34.0)
MCHC: 31.5 g/dL (ref 30.0–36.0)
MCV: 85.7 fL (ref 78.0–100.0)
PLATELETS: 136 10*3/uL — AB (ref 150–400)
RBC: 3.78 MIL/uL — ABNORMAL LOW (ref 3.87–5.11)
RDW: 14.7 % (ref 11.5–15.5)
WBC: 14.7 10*3/uL — AB (ref 4.0–10.5)

## 2016-04-03 LAB — TROPONIN I
TROPONIN I: 1.03 ng/mL — AB (ref <0.031)
TROPONIN I: 0.69 ng/mL — AB (ref <0.031)

## 2016-04-03 LAB — BASIC METABOLIC PANEL
Anion gap: 10 (ref 5–15)
BUN: 29 mg/dL — AB (ref 6–20)
CALCIUM: 8.1 mg/dL — AB (ref 8.9–10.3)
CO2: 21 mmol/L — ABNORMAL LOW (ref 22–32)
Chloride: 108 mmol/L (ref 101–111)
Creatinine, Ser: 2.02 mg/dL — ABNORMAL HIGH (ref 0.44–1.00)
GFR calc Af Amer: 26 mL/min — ABNORMAL LOW (ref >60)
GFR, EST NON AFRICAN AMERICAN: 23 mL/min — AB (ref >60)
Glucose, Bld: 203 mg/dL — ABNORMAL HIGH (ref 65–99)
POTASSIUM: 3.5 mmol/L (ref 3.5–5.1)
SODIUM: 139 mmol/L (ref 135–145)

## 2016-04-03 LAB — MAGNESIUM: MAGNESIUM: 1.8 mg/dL (ref 1.7–2.4)

## 2016-04-03 LAB — LACTIC ACID, PLASMA: LACTIC ACID, VENOUS: 1.5 mmol/L (ref 0.5–2.0)

## 2016-04-03 LAB — ECHOCARDIOGRAM COMPLETE
HEIGHTINCHES: 64 in
Weight: 2603.19 oz

## 2016-04-03 MED ORDER — ROSUVASTATIN CALCIUM 20 MG PO TABS
20.0000 mg | ORAL_TABLET | Freq: Every day | ORAL | Status: DC
  Administered 2016-04-03 – 2016-04-07 (×5): 20 mg via ORAL
  Filled 2016-04-03 (×5): qty 1

## 2016-04-03 MED ORDER — POTASSIUM CHLORIDE CRYS ER 20 MEQ PO TBCR
40.0000 meq | EXTENDED_RELEASE_TABLET | Freq: Once | ORAL | Status: AC
  Administered 2016-04-03: 40 meq via ORAL
  Filled 2016-04-03: qty 2

## 2016-04-03 MED ORDER — DEXTROSE 5 % IV SOLN
2.0000 g | INTRAVENOUS | Status: DC
  Administered 2016-04-03 – 2016-04-06 (×4): 2 g via INTRAVENOUS
  Filled 2016-04-03 (×5): qty 2

## 2016-04-03 MED ORDER — SENNOSIDES-DOCUSATE SODIUM 8.6-50 MG PO TABS
2.0000 | ORAL_TABLET | Freq: Every evening | ORAL | Status: DC | PRN
  Administered 2016-04-03: 2 via ORAL
  Filled 2016-04-03: qty 2

## 2016-04-03 NOTE — Progress Notes (Signed)
Assessment: wbc improved. Pain improved. Cannot asses perc drainage individually.   Plan: Follow. C/s from perc tube placement?  Discuss individual I/o from foley and perc tube drainage.   Subjective: Patient reports feeling better this AM. No flank pain. Perc tube draining well.   Objective: Vital signs in last 24 hours: Temp:  [98.1 F (36.7 C)-102.3 F (39.1 C)] 100.7 F (38.2 C) (05/17 0406) Pulse Rate:  [64-121] 92 (05/17 0752) Resp:  [12-31] 18 (05/17 0752) BP: (112-169)/(50-114) 138/71 mmHg (05/17 0752) SpO2:  [90 %-98 %] 97 % (05/17 0752) Weight:  [73.8 kg (162 lb 11.2 oz)] 73.8 kg (162 lb 11.2 oz) (05/16 1400)A  Intake/Output from previous day: 05/16 0701 - 05/17 0700 In: 1717.8 [I.V.:1617.8; IV Piggyback:100] Out: 1250 [Urine:1250] Intake/Output this shift:    Past Medical History  Diagnosis Date  . Diabetes mellitus without complication ([place])   . Arthritis   . Hyperlipidemia     Physical Exam:  Lungs - Normal respiratory effort, chest expands symmetrically.  Abdomen - Soft, non-tender & non-distended.  Lab Results:  Recent Labs  04/02/16 0830 04/03/16 0318  WBC 20.5* 14.7*  HGB 12.6 10.2*  HCT 38.3 32.4*   BMET  Recent Labs  04/02/16 0830 04/03/16 0318  NA 136 139  K 4.1 3.5  CL 100* 108  CO2 24 21*  GLUCOSE 200* 203*  BUN 45* 29*  CREATININE 2.62* 2.02*  CALCIUM 9.1 8.1*   No results for input(s): LABURIN in the last 72 hours. Results for orders placed or performed during the hospital encounter of 04/02/16  MRSA PCR Screening     Status: None   Collection Time: 04/02/16  2:08 PM  Result Value Ref Range Status   MRSA by PCR NEGATIVE NEGATIVE Final    Comment:        The GeneXpert MRSA Assay (FDA approved for NASAL specimens only), is one component of a comprehensive MRSA colonization surveillance program. It is not intended to diagnose MRSA infection nor to guide or monitor treatment for MRSA infections.      Studies/Results: Ct Abdomen Pelvis Wo Contrast  04/02/2016  CLINICAL DATA:  Left lower quadrant pain, epigastric pain and nausea starting Sunday EXAM: CT ABDOMEN AND PELVIS WITHOUT CONTRAST TECHNIQUE: Multidetector CT imaging of the abdomen and pelvis was performed following the standard protocol without IV contrast. COMPARISON:  02/03/2007 FINDINGS: Lower chest: Lung bases shows no infiltrate or pulmonary edema. Mild bronchitic changes are noted bilateral lower lobe. Hepatobiliary: The unenhanced liver shows no biliary ductal dilatation. No calcified gallstones are noted within gallbladder. No CBD dilatation. Pancreas: The unenhanced pancreas is unremarkable. Spleen: Unenhanced spleen is unremarkable. Adrenals/Urinary Tract: Bilateral adrenal adenomas are stable. On the right side measures 2.5 cm. Left adrenal adenoma measures 1.6 cm. There is significant left perinephric stranding. Moderate left hydronephrosis. Stable cortical scarring and small calculi in upper pole of the left kidney. Axial image 36 there is 8 mm calcified obstructive calculus in left UPJ. Nonobstructive calculus in midpole of the left kidney posterior aspect measures 4 mm. No right nephrolithiasis. No right hydronephrosis or hydroureter. Bilateral distal ureter is small caliber. There is a probable cyst in lower pole posterior aspect of the right kidney measures 1.8 cm. No calcified calculi are noted within urinary bladder. Stomach/Bowel: Small hiatal hernia. No gastric outlet obstruction. No small bowel obstruction. Oral contrast material was given to the patient. Terminal ileum is unremarkable. No pericecal inflammation. Normal appendix is noted in axial image 52. No distal colonic obstruction.  Few colonic diverticula are noted descending colon and proximal sigmoid colon. No evidence of colitis or diverticulitis. Vascular/Lymphatic: Atherosclerotic calcifications of abdominal aorta and iliac arteries. No aortic aneurysm. No  retroperitoneal or mesenteric adenopathy. Reproductive: Unenhanced uterus is atrophic.  No adnexal mass. Other: No ascites or free air. There is a tiny umbilical hernia containing fat without evidence of acute complication. Small right inguinal canal hernia containing fat measures 2 cm without evidence of acute complication. Musculoskeletal: No destructive bony lesions are noted. Mild levoscoliosis lumbar spine. Sagittal images of the spine shows significant degenerative changes lumbar spine and lower thoracic spine. Multilevel vacuum disc phenomenon anterior and posterior spurring noted lower thoracic and lumbar spine. IMPRESSION: 1. There is moderate left hydronephrosis. Mild left perinephric stranding. 2. Axial image 36 there is 8 mm calcified obstructive calculus in left UPJ. Nonobstructive calculus in mid to lower pole of the left kidney posterior aspect measures 4 mm. Again noted significant scarring and small cortical calcifications in upper pole of the left kidney. 3. No right nephrolithiasis. Probable cyst in lower pole of the right kidney measures 1.9 cm. Bilateral distal ureter is unremarkable. No right hydronephrosis or hydroureter. 4. Normal appendix.  No pericecal inflammation. 5. Extensive degenerative changes thoracolumbar spine. 6. No calcified calculi are noted within urinary bladder. 7. Atrophic uterus. 8. Stable bilateral adrenal gland adenomas. Electronically Signed   By: Lahoma Crocker M.D.   On: 04/02/2016 11:02   Dg Chest 2 View  04/02/2016  CLINICAL DATA:  Cough, congestion, shortness of breath for 3 days EXAM: CHEST  2 VIEW COMPARISON:  01/31/2005 FINDINGS: Cardiomediastinal silhouette is stable. No infiltrate or pleural effusion. No pulmonary edema. Mild perihilar bronchitic changes. Mild degenerative changes thoracic spine. IMPRESSION: No infiltrate or pulmonary edema. Mild perihilar bronchitic changes. Degenerative changes thoracic spine. Electronically Signed   By: Lahoma Crocker M.D.   On:  04/02/2016 09:13      Sani Madariaga I Vitali Seibert 04/03/2016, 8:12 AM

## 2016-04-03 NOTE — Progress Notes (Signed)
PROGRESS NOTE  Audrey Mullen [id number] DOB: [birth year] DOA: 04/02/2016 PCP: Elyn Peers, MD  HPI/Recap of past 24 hours:  S/p nephrostomy tube placement on 5/16, fever trending down, cr and wbc better, reported some left flank pain, no n/v. Remain in afib, heart rate controlled on cardizem drip, denies chest pain, no sob, no room air  Assessment/Plan: Principal Problem:   Sepsis ([place]) Active Problems:   AKI (acute kidney injury) ([place])   Pyelonephritis, acute   Atrial fibrillation with RVR ([place])  Sepsis:  Present on admission, evidenced by fever, a white count of 20,500, creatinine of 2.62, troponin of 0.25, lactic acid of 2.3, heart rates in the 160s. Source of infection likely from pyelonephritis in setting of obstructive uropathy.   on the sepsis protocol follow repeat lactic acid level, obtain blood cultures 2 sites, urine cultures, start empiric IV antibiotic therapy with ceftriaxone 2 g IV every 24 hours. admitted to the stepdown unit for close monitoring.  Acute pyelonephritis/obstructive uropathy/AKI. She presents septic, having left leg pain and nausea and vomiting workup in the emergency room included a CT scan that showed an 8 mm calcified obstructive calculus in the left UPJ. This was associated with moderate left hydronephrosis. As mentioned above initiating empiric IV antibiotic therapy with ceftriaxone 2 g IV every 24 hours. Dr. Gaynelle Arabian of urology who advised to  have interventional radiology place a percutaneous drainage catheter which was done on 5/16 Cr /wbc better  Afib/rvr: new diagnose.  tsh 0.3, keep k>4, mag >2,  CHADSvasc score 4 with age, female, h/o diet controlled dm2. Was not previously on meds or anticoagulation, cardizem drip started from admission, Cardiology consulted.  Troponin elevation: in the setting of sepsis/aki/afib, denies chest pain, bp stable. ekg afib/non specific st/t flattening. Echo pending, cardiology consulted.  H/o  HTN;  Home meds aldactazide held  H/o hld: continue crestor  H/o diet controlled dm2, a1c pending   Code Status: full  Family Communication: patient   Disposition Plan: remain in stepdown   Consultants:  Urology for nephrolithiasis and obstructive uropathy  IR for perc drain  Cardiology for afib (new diagnose), troponin elevation  Procedures:  Left PCN insertion on 5/16 by IR  Antibiotics:  Rocephin from admission    Objective: BP 138/71 mmHg  Pulse 92  Temp(Src) 98.2 F (36.8 C) (Oral)  Resp 18  Ht 5\' 4"  (1.626 m)  Wt 73.8 kg (162 lb 11.2 oz)  BMI 27.91 kg/m2  SpO2 97%  Intake/Output Summary (Last 24 hours) at 04/03/16 0910 Last data filed at 04/03/16 0900  Gross per 24 hour  Intake 2047.75 ml  Output   1250 ml  Net 797.75 ml   Filed Weights   04/02/16 0757 04/02/16 1400  Weight: 79.379 kg (175 lb) 73.8 kg (162 lb 11.2 oz)    Exam:   General:  NAD, weak   Cardiovascular: IRRR  Respiratory: CTABL  Abdomen: Soft/ND/NT, positive BS, left sided nephrostomy tube in place.  Musculoskeletal: No Edema  Neuro: Somewhat drowsy but oriented   Data Reviewed: Basic Metabolic Panel:  Recent Labs Lab 04/02/16 0830 04/03/16 0318  NA 136 139  K 4.1 3.5  CL 100* 108  CO2 24 21*  GLUCOSE 200* 203*  BUN 45* 29*  CREATININE 2.62* 2.02*  CALCIUM 9.1 8.1*   Liver Function Tests:  Recent Labs Lab 04/02/16 0830  AST 36  ALT 19  ALKPHOS 77  BILITOT 1.0  PROT 7.5  ALBUMIN 3.9    Recent Labs  Lab 04/02/16 0830  LIPASE 19   No results for input(s): AMMONIA in the last 168 hours. CBC:  Recent Labs Lab 04/02/16 0830 04/03/16 0318  WBC 20.5* 14.7*  NEUTROABS 19.1*  --   HGB 12.6 10.2*  HCT 38.3 32.4*  MCV 84.2 85.7  PLT 144* 136*   Cardiac Enzymes:    Recent Labs Lab 04/02/16 1525 04/02/16 2050 04/03/16 0318  TROPONINI 0.39* 0.84* 1.03*   BNP (last 3 results) No results for input(s): BNP in the last 8760 hours.  ProBNP  (last 3 results) No results for input(s): PROBNP in the last 8760 hours.  CBG:  Recent Labs Lab 04/02/16 1612 04/02/16 1940 04/02/16 2259 04/03/16 0800  GLUCAP 164* 131* 151* 169*    Recent Results (from the past 240 hour(s))  MRSA PCR Screening     Status: None   Collection Time: 04/02/16  2:08 PM  Result Value Ref Range Status   MRSA by PCR NEGATIVE NEGATIVE Final    Comment:        The GeneXpert MRSA Assay (FDA approved for NASAL specimens only), is one component of a comprehensive MRSA colonization surveillance program. It is not intended to diagnose MRSA infection nor to guide or monitor treatment for MRSA infections.      Studies: Ct Abdomen Pelvis Wo Contrast  04/02/2016  CLINICAL DATA:  Left lower quadrant pain, epigastric pain and nausea starting Sunday EXAM: CT ABDOMEN AND PELVIS WITHOUT CONTRAST TECHNIQUE: Multidetector CT imaging of the abdomen and pelvis was performed following the standard protocol without IV contrast. COMPARISON:  02/03/2007 FINDINGS: Lower chest: Lung bases shows no infiltrate or pulmonary edema. Mild bronchitic changes are noted bilateral lower lobe. Hepatobiliary: The unenhanced liver shows no biliary ductal dilatation. No calcified gallstones are noted within gallbladder. No CBD dilatation. Pancreas: The unenhanced pancreas is unremarkable. Spleen: Unenhanced spleen is unremarkable. Adrenals/Urinary Tract: Bilateral adrenal adenomas are stable. On the right side measures 2.5 cm. Left adrenal adenoma measures 1.6 cm. There is significant left perinephric stranding. Moderate left hydronephrosis. Stable cortical scarring and small calculi in upper pole of the left kidney. Axial image 36 there is 8 mm calcified obstructive calculus in left UPJ. Nonobstructive calculus in midpole of the left kidney posterior aspect measures 4 mm. No right nephrolithiasis. No right hydronephrosis or hydroureter. Bilateral distal ureter is small caliber. There is a  probable cyst in lower pole posterior aspect of the right kidney measures 1.8 cm. No calcified calculi are noted within urinary bladder. Stomach/Bowel: Small hiatal hernia. No gastric outlet obstruction. No small bowel obstruction. Oral contrast material was given to the patient. Terminal ileum is unremarkable. No pericecal inflammation. Normal appendix is noted in axial image 52. No distal colonic obstruction. Few colonic diverticula are noted descending colon and proximal sigmoid colon. No evidence of colitis or diverticulitis. Vascular/Lymphatic: Atherosclerotic calcifications of abdominal aorta and iliac arteries. No aortic aneurysm. No retroperitoneal or mesenteric adenopathy. Reproductive: Unenhanced uterus is atrophic.  No adnexal mass. Other: No ascites or free air. There is a tiny umbilical hernia containing fat without evidence of acute complication. Small right inguinal canal hernia containing fat measures 2 cm without evidence of acute complication. Musculoskeletal: No destructive bony lesions are noted. Mild levoscoliosis lumbar spine. Sagittal images of the spine shows significant degenerative changes lumbar spine and lower thoracic spine. Multilevel vacuum disc phenomenon anterior and posterior spurring noted lower thoracic and lumbar spine. IMPRESSION: 1. There is moderate left hydronephrosis. Mild left perinephric stranding. 2. Axial image 36 there  is 8 mm calcified obstructive calculus in left UPJ. Nonobstructive calculus in mid to lower pole of the left kidney posterior aspect measures 4 mm. Again noted significant scarring and small cortical calcifications in upper pole of the left kidney. 3. No right nephrolithiasis. Probable cyst in lower pole of the right kidney measures 1.9 cm. Bilateral distal ureter is unremarkable. No right hydronephrosis or hydroureter. 4. Normal appendix.  No pericecal inflammation. 5. Extensive degenerative changes thoracolumbar spine. 6. No calcified calculi are noted  within urinary bladder. 7. Atrophic uterus. 8. Stable bilateral adrenal gland adenomas. Electronically Signed   By: Lahoma Crocker M.D.   On: 04/02/2016 11:02    Scheduled Meds: . cefTRIAXone (ROCEPHIN)  IV  1 g Intravenous Q24H  . heparin  5,000 Units Subcutaneous Q8H  . insulin aspart  0-15 Units Subcutaneous TID WC  . sodium chloride flush  3 mL Intravenous Q12H    Continuous Infusions: . sodium chloride 100 mL/hr at 04/03/16 0150  . diltiazem (CARDIZEM) infusion 5 mg/hr (04/03/16 0547)     Time spent: 43mins  Yoshi Vicencio MD, PhD  [facility] Pager [phone]. If 7PM-7AM, please contact night-coverage at [email or web address], password [email or web address] 04/03/2016, 9:10 AM  LOS: 1 day

## 2016-04-03 NOTE — Progress Notes (Signed)
Reported critical tropnin of 1.03 to NP.

## 2016-04-03 NOTE — Progress Notes (Signed)
Patient ID: Audrey Mullen, female   DOB: 1941/07/02, 76 y.o.   MRN: [id number]    Referring Physician(s): Leslie  Supervising Physician: Aletta Edouard  Patient Status: In-pt  Chief Complaint:  Left nephrolithiasis/hydronephrosis  Subjective:  Pt doing fair; still with occ nausea, no sig appetite; some left flank soreness  Allergies: Review of patient's allergies indicates no known allergies.  Medications: Prior to Admission medications   Medication Sig Start Date End Date Taking? Authorizing Provider  rosuvastatin (CRESTOR) 20 MG tablet Take 20 mg by mouth daily.   Yes Historical Provider, MD  spironolactone-hydrochlorothiazide (ALDACTAZIDE) 25-25 MG tablet Take 1 tablet by mouth daily. 03/23/16  Yes Historical Provider, MD  fluticasone (FLONASE) 50 MCG/ACT nasal spray Place 2 sprays into the nose daily. Patient not taking: Reported on 04/02/2016 08/13/13   Darlyne Russian, MD     Vital Signs: BP 144/57 mmHg  Pulse 93  Temp(Src) 98.5 F (36.9 C) (Oral)  Resp 22  Ht 5\' 4"  (1.626 m)  Wt 162 lb 11.2 oz (73.8 kg)  BMI 27.91 kg/m2  SpO2 92%  Physical Exam left PCN intact, insertion site clean and dry, mildly tender; hazy, tea colored urine in PCN bag, about 300-400 cc noted; cx's pend; drain flushes easily  Imaging: Ct Abdomen Pelvis Wo Contrast  04/02/2016  CLINICAL DATA:  Left lower quadrant pain, epigastric pain and nausea starting Sunday EXAM: CT ABDOMEN AND PELVIS WITHOUT CONTRAST TECHNIQUE: Multidetector CT imaging of the abdomen and pelvis was performed following the standard protocol without IV contrast. COMPARISON:  02/03/2007 FINDINGS: Lower chest: Lung bases shows no infiltrate or pulmonary edema. Mild bronchitic changes are noted bilateral lower lobe. Hepatobiliary: The unenhanced liver shows no biliary ductal dilatation. No calcified gallstones are noted within gallbladder. No CBD dilatation. Pancreas: The unenhanced pancreas is unremarkable.  Spleen: Unenhanced spleen is unremarkable. Adrenals/Urinary Tract: Bilateral adrenal adenomas are stable. On the right side measures 2.5 cm. Left adrenal adenoma measures 1.6 cm. There is significant left perinephric stranding. Moderate left hydronephrosis. Stable cortical scarring and small calculi in upper pole of the left kidney. Axial image 36 there is 8 mm calcified obstructive calculus in left UPJ. Nonobstructive calculus in midpole of the left kidney posterior aspect measures 4 mm. No right nephrolithiasis. No right hydronephrosis or hydroureter. Bilateral distal ureter is small caliber. There is a probable cyst in lower pole posterior aspect of the right kidney measures 1.8 cm. No calcified calculi are noted within urinary bladder. Stomach/Bowel: Small hiatal hernia. No gastric outlet obstruction. No small bowel obstruction. Oral contrast material was given to the patient. Terminal ileum is unremarkable. No pericecal inflammation. Normal appendix is noted in axial image 52. No distal colonic obstruction. Few colonic diverticula are noted descending colon and proximal sigmoid colon. No evidence of colitis or diverticulitis. Vascular/Lymphatic: Atherosclerotic calcifications of abdominal aorta and iliac arteries. No aortic aneurysm. No retroperitoneal or mesenteric adenopathy. Reproductive: Unenhanced uterus is atrophic.  No adnexal mass. Other: No ascites or free air. There is a tiny umbilical hernia containing fat without evidence of acute complication. Small right inguinal canal hernia containing fat measures 2 cm without evidence of acute complication. Musculoskeletal: No destructive bony lesions are noted. Mild levoscoliosis lumbar spine. Sagittal images of the spine shows significant degenerative changes lumbar spine and lower thoracic spine. Multilevel vacuum disc phenomenon anterior and posterior spurring noted lower thoracic and lumbar spine. IMPRESSION: 1. There is moderate left hydronephrosis. Mild  left perinephric stranding. 2. Axial image 36 there is 8 mm  calcified obstructive calculus in left UPJ. Nonobstructive calculus in mid to lower pole of the left kidney posterior aspect measures 4 mm. Again noted significant scarring and small cortical calcifications in upper pole of the left kidney. 3. No right nephrolithiasis. Probable cyst in lower pole of the right kidney measures 1.9 cm. Bilateral distal ureter is unremarkable. No right hydronephrosis or hydroureter. 4. Normal appendix.  No pericecal inflammation. 5. Extensive degenerative changes thoracolumbar spine. 6. No calcified calculi are noted within urinary bladder. 7. Atrophic uterus. 8. Stable bilateral adrenal gland adenomas. Electronically Signed   By: Lahoma Crocker M.D.   On: 04/02/2016 11:02   Dg Chest 2 View  04/02/2016  CLINICAL DATA:  Cough, congestion, shortness of breath for 3 days EXAM: CHEST  2 VIEW COMPARISON:  01/31/2005 FINDINGS: Cardiomediastinal silhouette is stable. No infiltrate or pleural effusion. No pulmonary edema. Mild perihilar bronchitic changes. Mild degenerative changes thoracic spine. IMPRESSION: No infiltrate or pulmonary edema. Mild perihilar bronchitic changes. Degenerative changes thoracic spine. Electronically Signed   By: Lahoma Crocker M.D.   On: 04/02/2016 09:13    Labs:  CBC:  Recent Labs  04/02/16 0830 04/03/16 0318  WBC 20.5* 14.7*  HGB 12.6 10.2*  HCT 38.3 32.4*  PLT 144* 136*    COAGS:  Recent Labs  04/02/16 1239 04/02/16 1525  INR 1.14 1.13  APTT 27 28    BMP:  Recent Labs  04/02/16 0830 04/03/16 0318  NA 136 139  K 4.1 3.5  CL 100* 108  CO2 24 21*  GLUCOSE 200* 203*  BUN 45* 29*  CALCIUM 9.1 8.1*  CREATININE 2.62* 2.02*  GFRNONAA 17* 23*  GFRAA 19* 26*    LIVER FUNCTION TESTS:  Recent Labs  04/02/16 0830  BILITOT 1.0  AST 36  ALT 19  ALKPHOS 77  PROT 7.5  ALBUMIN 3.9    Assessment and Plan: S/p left PCN 5/16 secondary to  nephrolithiasis/hydro-pyonephrosis; check final urine cx's; AF; WBC 14.7(20.5), hgb 10.2, creat 2.02(2.62); hydrate; antbx per pharm; other plans as per urology/cards/IM   Electronically Signed: D. Rowe Robert 04/03/2016, 4:10 PM   I spent a total of 20 minutes at the the patient's bedside AND on the patient's hospital floor or unit, greater than 50% of which was counseling/coordinating care for left perc nephrostomy

## 2016-04-03 NOTE — Consult Note (Signed)
CARDIOLOGY CONSULT NOTE   Patient ID: Audrey Mullen MRN: [id number] DOB/AGE: [birth year] 75 y.o.  Admit date: 04/02/2016  Requesting Physician: Dr. Erlinda Hong Primary Physician:   Elyn Peers, MD Primary Cardiologist:   New Reason for Consultation:   afib with RVR and elevated troponin   HPI: Audrey Mullen is a 75 y.o. female with a history of HTN, DMT2, HLD, nephrolithiasis and no prior cardiac history who presented to [facility] on 04/02/16 with flank pain and n/v. She was found to have sepsis 2/2 acute pyelonephritis and obstructive uropathy. She was also found to have afib with RVR and troponin elevation and cardiology was consulted.   No hx of afib. She saw Dr. Everlene Farrier in 2014 who noted PVCs and offered referal to cardiology. I do not see that this was ever done.   She has been having abdominal pain, n/v and weakness since last Sunday prompting her to seek medical attention on 04/02/16. In the emergency room she was found to be in atrial fibrillation with rapid ventricular response and initially treated with IV Cardizem. Lab work revealed elevated lactate of 2.3, white count of 20,500 with CT scan of abdomen and pelvis revealed moderate left hydronephrosis secondary to a UPJ obstruction from a stone. She is now s/p nephrostomy tube placement on 04/02/16.   Her atrial fibrillation is currently well rate controlled on cardizem gtt. White count, creat and troponin improving. She is feeling better and has no complaints currently. She has never had any heart problems in the past or seen a cardiologist. She has no family history of CAD but two of her siblings are on coumadin. She is very active at home and does water aerobics 2x week. No exertional chest pain or SOB. She denies palpitations and was unaware of her atrial fibrillation even when her HR was elevated. No LE edema, orthopnea or PND. No dizziness or syncope.     Past Medical History  Diagnosis Date  . Diabetes mellitus without  complication ([place])   . Arthritis   . Hyperlipidemia      Past Surgical History  Procedure Laterality Date  . Rotator cuff repair      No Known Allergies  I have reviewed the patient's current medications . cefTRIAXone (ROCEPHIN)  IV  1 g Intravenous Q24H  . heparin  5,000 Units Subcutaneous Q8H  . insulin aspart  0-15 Units Subcutaneous TID WC  . potassium chloride  40 mEq Oral Once  . rosuvastatin  20 mg Oral Daily  . sodium chloride flush  3 mL Intravenous Q12H   . sodium chloride 100 mL/hr at 04/03/16 0150  . diltiazem (CARDIZEM) infusion 5 mg/hr (04/03/16 0547)   acetaminophen **OR** acetaminophen, lidocaine, morphine injection, ondansetron **OR** ondansetron (ZOFRAN) IV, oxyCODONE  Prior to Admission medications   Medication Sig Start Date End Date Taking? Authorizing Provider  rosuvastatin (CRESTOR) 20 MG tablet Take 20 mg by mouth daily.   Yes Historical Provider, MD  spironolactone-hydrochlorothiazide (ALDACTAZIDE) 25-25 MG tablet Take 1 tablet by mouth daily. 03/23/16  Yes Historical Provider, MD  fluticasone (FLONASE) 50 MCG/ACT nasal spray Place 2 sprays into the nose daily. Patient not taking: Reported on 04/02/2016 08/13/13   Darlyne Russian, MD     Social History   Social History  . Marital Status: Married    Spouse Name: N/A  . Number of Children: N/A  . Years of Education: N/A   Occupational History  . Not on file.   Social History  Main Topics  . Smoking status: Never Smoker   . Smokeless tobacco: Not on file  . Alcohol Use: No  . Drug Use: No  . Sexual Activity: Not on file   Other Topics Concern  . Not on file   Social History Narrative    Family Status  Relation Status Death Age  . Mother Deceased   . Father Deceased   . Sister Alive   . Brother Alive   . Maternal Grandmother Deceased   . Maternal Grandfather Deceased   . Paternal Grandmother Deceased   . Paternal Grandfather Deceased   . Sister Alive    History reviewed. No pertinent  family history.   ROS:  Full 14 point review of systems complete and found to be negative unless listed above.  Physical Exam: Blood pressure 138/71, pulse 92, temperature 98.2 F (36.8 C), temperature source Oral, resp. rate 18, height 5\' 4"  (1.626 m), weight 162 lb 11.2 oz (73.8 kg), SpO2 97 %.  General: Well developed, well nourished, female in no acute distress Head: Eyes PERRLA, No xanthomas.   Normocephalic and atraumatic, oropharynx without edema or exudate.  Lungs: CTAB very faint wheeze Heart: HRRR S1 S2, no rub/gallop, Heart irregular rate and rhythm with S1, S2  murmur. pulses are 2+ extrem.   Neck: No carotid bruits. No lymphadenopathy. No  JVD. Abdomen: Bowel sounds present, abdomen soft and non-tender without masses or hernias noted. Msk:  No spine or cva tenderness. No weakness, no joint deformities or effusions. Extremities: No clubbing or cyanosis. No LE edema.  Neuro: Alert and oriented X 3. No focal deficits noted. Psych:  Good affect, responds appropriately Skin: No rashes or lesions noted.  Labs:   Lab Results  Component Value Date   WBC 14.7* 04/03/2016   HGB 10.2* 04/03/2016   HCT 32.4* 04/03/2016   MCV 85.7 04/03/2016   PLT 136* 04/03/2016    Recent Labs  04/02/16 1525  INR 1.13    Recent Labs Lab 04/02/16 0830 04/03/16 0318  NA 136 139  K 4.1 3.5  CL 100* 108  CO2 24 21*  BUN 45* 29*  CREATININE 2.62* 2.02*  CALCIUM 9.1 8.1*  PROT 7.5  --   BILITOT 1.0  --   ALKPHOS 77  --   ALT 19  --   AST 36  --   GLUCOSE 200* 203*  ALBUMIN 3.9  --    No results found for: MG  Recent Labs  04/02/16 1525 04/02/16 2050 04/03/16 0318  TROPONINI 0.39* 0.84* 1.03*    Recent Labs  04/02/16 0835 04/02/16 1201  TROPIPOC 0.15* 0.25*    LIPASE  Date/Time Value Ref Range Status  04/02/2016 08:30 AM 19 11 - 51 U/L Final   TSH  Date/Time Value Ref Range Status  04/02/2016 03:25 PM 0.352 0.350 - 4.500 uIU/mL Final   No results found for:  VITAMINB12, FOLATE, FERRITIN, TIBC, IRON, RETICCTPCT  Echo: pending.   ECG:  afib with CVR.  Radiology:  Ct Abdomen Pelvis Wo Contrast  04/02/2016  CLINICAL DATA:  Left lower quadrant pain, epigastric pain and nausea starting Sunday EXAM: CT ABDOMEN AND PELVIS WITHOUT CONTRAST TECHNIQUE: Multidetector CT imaging of the abdomen and pelvis was performed following the standard protocol without IV contrast. COMPARISON:  02/03/2007 FINDINGS: Lower chest: Lung bases shows no infiltrate or pulmonary edema. Mild bronchitic changes are noted bilateral lower lobe. Hepatobiliary: The unenhanced liver shows no biliary ductal dilatation. No calcified gallstones are noted within gallbladder. No  CBD dilatation. Pancreas: The unenhanced pancreas is unremarkable. Spleen: Unenhanced spleen is unremarkable. Adrenals/Urinary Tract: Bilateral adrenal adenomas are stable. On the right side measures 2.5 cm. Left adrenal adenoma measures 1.6 cm. There is significant left perinephric stranding. Moderate left hydronephrosis. Stable cortical scarring and small calculi in upper pole of the left kidney. Axial image 36 there is 8 mm calcified obstructive calculus in left UPJ. Nonobstructive calculus in midpole of the left kidney posterior aspect measures 4 mm. No right nephrolithiasis. No right hydronephrosis or hydroureter. Bilateral distal ureter is small caliber. There is a probable cyst in lower pole posterior aspect of the right kidney measures 1.8 cm. No calcified calculi are noted within urinary bladder. Stomach/Bowel: Small hiatal hernia. No gastric outlet obstruction. No small bowel obstruction. Oral contrast material was given to the patient. Terminal ileum is unremarkable. No pericecal inflammation. Normal appendix is noted in axial image 52. No distal colonic obstruction. Few colonic diverticula are noted descending colon and proximal sigmoid colon. No evidence of colitis or diverticulitis. Vascular/Lymphatic: Atherosclerotic  calcifications of abdominal aorta and iliac arteries. No aortic aneurysm. No retroperitoneal or mesenteric adenopathy. Reproductive: Unenhanced uterus is atrophic.  No adnexal mass. Other: No ascites or free air. There is a tiny umbilical hernia containing fat without evidence of acute complication. Small right inguinal canal hernia containing fat measures 2 cm without evidence of acute complication. Musculoskeletal: No destructive bony lesions are noted. Mild levoscoliosis lumbar spine. Sagittal images of the spine shows significant degenerative changes lumbar spine and lower thoracic spine. Multilevel vacuum disc phenomenon anterior and posterior spurring noted lower thoracic and lumbar spine. IMPRESSION: 1. There is moderate left hydronephrosis. Mild left perinephric stranding. 2. Axial image 36 there is 8 mm calcified obstructive calculus in left UPJ. Nonobstructive calculus in mid to lower pole of the left kidney posterior aspect measures 4 mm. Again noted significant scarring and small cortical calcifications in upper pole of the left kidney. 3. No right nephrolithiasis. Probable cyst in lower pole of the right kidney measures 1.9 cm. Bilateral distal ureter is unremarkable. No right hydronephrosis or hydroureter. 4. Normal appendix.  No pericecal inflammation. 5. Extensive degenerative changes thoracolumbar spine. 6. No calcified calculi are noted within urinary bladder. 7. Atrophic uterus. 8. Stable bilateral adrenal gland adenomas. Electronically Signed   By: Lahoma Crocker M.D.   On: 04/02/2016 11:02   Dg Chest 2 View  04/02/2016  CLINICAL DATA:  Cough, congestion, shortness of breath for 3 days EXAM: CHEST  2 VIEW COMPARISON:  01/31/2005 FINDINGS: Cardiomediastinal silhouette is stable. No infiltrate or pleural effusion. No pulmonary edema. Mild perihilar bronchitic changes. Mild degenerative changes thoracic spine. IMPRESSION: No infiltrate or pulmonary edema. Mild perihilar bronchitic changes.  Degenerative changes thoracic spine. Electronically Signed   By: Lahoma Crocker M.D.   On: 04/02/2016 09:13    ASSESSMENT AND PLAN:    Principal Problem:   Sepsis ([place]) Active Problems:   AKI (acute kidney injury) ([place])   Pyelonephritis, acute   Atrial fibrillation with RVR ([place])  Newly diagnosed Afib with RVR: HR under good control on cardizem gtt at 73mcg/hr.  -- TSH normal. Likely precipitated by sepsis. -- CHADSvasc score 5 (HTN, age, F sex, DM). Would start oral AC when cleared by urology. Unclear baseline kidney function, may need coumadin.  -- 2D ECHO pending.  -- Would likely plan for rate control since she is asymptomatic. If she remains in afib we could consider DCCV after at least 3 weeks of theraputic INRs or  3 weeks of a NOAC.  Troponin elevation: 0.25--> 1.03--> 0.69. likely demand in the setting of sepsis, acute kidney injury and afib with rvr.  She has had no chest pain. Likely outpatient myoview if EF normal on ECHO  AKI: creat up to 2.62--> 2.02 today. Unclear baseline. Crea 1.32 in 2008.   HTN: Home meds spiro=HCTZ 25-25mg  held  HLD: continue crestor  Diet controlled T2DM:  a1c pending  Sepsis: presented with fever, WBC 20,500, creatinine of 2.62, troponin of 0.25, lactic acid of 2.3, heart rates in the 160s. Source of infection likely from pyelonephritis in setting of obstructive uropathy.   Acute pyelonephritis/obstructive uropathy/AKI. CT scan that showed an 8 mm calcified obstructive calculus in the left UPJ with moderate left hydronephrosis. S/p percutaneous drainage catheter on 5/16.    Signed: Angelena Form, PA-C 04/03/2016 9:42 AM  Pager [phone]  Co-Sign MD  Patient examined chart reviewed.  Pain better. Stil with some blood tinged urine from left nephrostomy tube Rate control is fine Echo pending Exam remarkable for clear lungs no bruit or murmur. Left nephrostomy tube With abnormal Cr/GFR would not use NOAC.  Start heparin and coumadin when ok  with urology.  Echo pending Agree that any consideratio for [facility] can wait 4-5 weeks while her pylonephritis is RX and hopefully nephrostomy Tube pulled  [employer]

## 2016-04-03 NOTE — Progress Notes (Signed)
  Echocardiogram 2D Echocardiogram has been performed.  Audrey Mullen M 04/03/2016, 8:39 AM

## 2016-04-03 NOTE — Progress Notes (Addendum)
PHARMACY - PHYSICIAN COMMUNICATION CRITICAL VALUE ALERT - BLOOD CULTURE IDENTIFICATION (BCID)  BCID result received:  [ ]  Vancomycin resistant enterococcus (VRE) [ ]  Enterococcus spp (no resistance detected) [ ]  Listeria monocytogenes [ ]  Staphylococcus species (methicillin resistance detected) [ ]  Staphylococcus species (methicllin resistance NOT detected) [ ]  Methicillin-resistant Staphylococcus aureus (MRSA) [ ]  Methicillin-susceptible Staphylococcus aureus (MSSA) [ ]  Streptococcus agalactiae (Group B strep) [ ]  Streptococcus pneumoniae [ ]  Streptococcus pyogenes (Group A strep) [ ]  Streptococcus spp.  [ ]  Acinetobacter baumannii [X]  Enterobacteriaceae species detected [ ]  Enterobacter cloacae [ ]  Enterobacter cloacae (Carbapenem resistance detected) [ ]  Escherichia coli [ ]  Escherichia coli (Carbapenem resistance detected) [ ]  Haemophilus influenza [ ]  Klebsiella oxytoca [ ]  Klebsiella oxytoca (Carbapenem resistance detected) [ ]  Klebsiella pneumoniae [ ]  Klebsiella pneumoniae (Carbapenem resistance detected) [ ]  Neisseria meningitidis [ ]  Proteus spp. [ ]  Proteus spp. (Carbapenem resistance detected) [ ]  Pseudomonas aeruginosa [ ]  Pseudomonas aeruginosa (Carbapenem resistance detected) [ ]  Serratia marcescens [ ]  Candida _______ albicans, glabrata, krusei, parapsilosis, tropicalis (fill in blank with correct species)  Name of physician (or Provider) Contacted: Dr. Erlinda Hong contacted via text page.  Likely that current regimen of Ceftriaxone would cover Enterobacteriaceae species.  LA, WBC improving.  Do not have any micro history to review prior to this admission.  Recommended continuing Ceftriaxone if patient continues to improve; otherwise, would escalate therapy to Cefepime.  Will increase Ceftriaxone to 2g IV q24h.  Audrey Mullen 04/03/2016 1:46 PM

## 2016-04-04 DIAGNOSIS — N132 Hydronephrosis with renal and ureteral calculous obstruction: Secondary | ICD-10-CM

## 2016-04-04 DIAGNOSIS — N1339 Other hydronephrosis: Secondary | ICD-10-CM

## 2016-04-04 DIAGNOSIS — N1330 Unspecified hydronephrosis: Secondary | ICD-10-CM | POA: Insufficient documentation

## 2016-04-04 LAB — BASIC METABOLIC PANEL
Anion gap: 6 (ref 5–15)
BUN: 21 mg/dL — AB (ref 6–20)
CHLORIDE: 110 mmol/L (ref 101–111)
CO2: 21 mmol/L — AB (ref 22–32)
CREATININE: 1.47 mg/dL — AB (ref 0.44–1.00)
Calcium: 8.3 mg/dL — ABNORMAL LOW (ref 8.9–10.3)
GFR calc Af Amer: 39 mL/min — ABNORMAL LOW (ref >60)
GFR calc non Af Amer: 33 mL/min — ABNORMAL LOW (ref >60)
Glucose, Bld: 160 mg/dL — ABNORMAL HIGH (ref 65–99)
Potassium: 3.6 mmol/L (ref 3.5–5.1)
Sodium: 137 mmol/L (ref 135–145)

## 2016-04-04 LAB — GLUCOSE, CAPILLARY
GLUCOSE-CAPILLARY: 169 mg/dL — AB (ref 65–99)
GLUCOSE-CAPILLARY: 141 mg/dL — AB (ref 65–99)
GLUCOSE-CAPILLARY: 121 mg/dL — AB (ref 65–99)
Glucose-Capillary: 166 mg/dL — ABNORMAL HIGH (ref 65–99)

## 2016-04-04 LAB — LIPID PANEL
Cholesterol: 178 mg/dL (ref 0–200)
HDL: 24 mg/dL — ABNORMAL LOW (ref >40)
LDL CALC: 107 mg/dL — AB (ref 0–99)
Total CHOL/HDL Ratio: 7.4 RATIO
Triglycerides: 237 mg/dL — ABNORMAL HIGH (ref <150)
VLDL: 47 mg/dL — AB (ref 0–40)

## 2016-04-04 LAB — CBC
HEMATOCRIT: 30.5 % — AB (ref 36.0–46.0)
HEMOGLOBIN: 9.7 g/dL — AB (ref 12.0–15.0)
MCH: 26.7 pg (ref 26.0–34.0)
MCHC: 31.8 g/dL (ref 30.0–36.0)
MCV: 84 fL (ref 78.0–100.0)
Platelets: 151 10*3/uL (ref 150–400)
RBC: 3.63 MIL/uL — ABNORMAL LOW (ref 3.87–5.11)
RDW: 14.7 % (ref 11.5–15.5)
WBC: 9.4 10*3/uL (ref 4.0–10.5)

## 2016-04-04 LAB — HEMOGLOBIN A1C
HEMOGLOBIN A1C: 7.5 % — AB (ref 4.8–5.6)
Mean Plasma Glucose: 169 mg/dL

## 2016-04-04 MED ORDER — DILTIAZEM HCL ER COATED BEADS 180 MG PO CP24
180.0000 mg | ORAL_CAPSULE | Freq: Every day | ORAL | Status: DC
  Administered 2016-04-04: 180 mg via ORAL
  Filled 2016-04-04: qty 1

## 2016-04-04 MED ORDER — POTASSIUM CHLORIDE CRYS ER 20 MEQ PO TBCR
40.0000 meq | EXTENDED_RELEASE_TABLET | Freq: Once | ORAL | Status: AC
  Administered 2016-04-04: 40 meq via ORAL
  Filled 2016-04-04: qty 2

## 2016-04-04 MED ORDER — NYSTATIN 100000 UNIT/ML MT SUSP
5.0000 mL | Freq: Four times a day (QID) | OROMUCOSAL | Status: DC
  Administered 2016-04-04 – 2016-04-07 (×11): 500000 [IU] via ORAL
  Filled 2016-04-04 (×10): qty 5

## 2016-04-04 MED ORDER — DILTIAZEM HCL ER COATED BEADS 120 MG PO CP24: 120.0000 mg | ORAL_CAPSULE | Freq: Every day | ORAL | Status: DC

## 2016-04-04 NOTE — Progress Notes (Signed)
Patient Name: NELROSE WIMBLEY Date of Encounter: 04/04/2016     Principal Problem:   Sepsis ([place]) Active Problems:   AKI (acute kidney injury) ([place])   Pyelonephritis, acute   Atrial fibrillation with RVR ([place])    SUBJECTIVE  No complaints. Just phlegm in her throat  CURRENT MEDS . cefTRIAXone (ROCEPHIN)  IV  2 g Intravenous Q24H  . heparin  5,000 Units Subcutaneous Q8H  . insulin aspart  0-15 Units Subcutaneous TID WC  . rosuvastatin  20 mg Oral Daily  . sodium chloride flush  3 mL Intravenous Q12H    OBJECTIVE  Filed Vitals:   04/03/16 1917 04/03/16 1925 04/04/16 0007 04/04/16 0403  BP: 147/77  160/75 153/61  Pulse: 87  91 86  Temp:  99.5 F (37.5 C) 99.1 F (37.3 C)   TempSrc:  Oral Oral   Resp: 24  25 20   Height:      Weight:      SpO2: 93%  93% 93%    Intake/Output Summary (Last 24 hours) at 04/04/16 0732 Last data filed at 04/04/16 0649  Gross per 24 hour  Intake   2585 ml  Output   1450 ml  Net   1135 ml   Filed Weights   04/02/16 0757 04/02/16 1400  Weight: 175 lb (79.379 kg) 162 lb 11.2 oz (73.8 kg)    PHYSICAL EXAM  General: Pleasant, NAD. Neuro: Alert and oriented X 3. Moves all extremities spontaneously. Psych: Normal affect. HEENT:  Normal  Neck: Supple without bruits or JVD. Lungs:  Resp regular and unlabored, CTA. Heart: irreg irreg no s3, s4, or murmurs. Abdomen: Soft, non-tender, non-distended, BS + x 4. S/p nephrostomy tube  Extremities: No clubbing, cyanosis or edema. DP/PT/Radials 2+ and equal bilaterally.  Accessory Clinical Findings  CBC  Recent Labs  04/02/16 0830 04/03/16 0318 04/04/16 0327  WBC 20.5* 14.7* 9.4  NEUTROABS 19.1*  --   --   HGB 12.6 10.2* 9.7*  HCT 38.3 32.4* 30.5*  MCV 84.2 85.7 84.0  PLT 144* 136* [id number]   Basic Metabolic Panel  Recent Labs  04/03/16 0318 04/03/16 0928 04/04/16 0327  NA 139  --  137  K 3.5  --  3.6  CL 108  --  110  CO2 21*  --  21*  GLUCOSE 203*  --  160*  BUN  29*  --  21*  CREATININE 2.02*  --  1.47*  CALCIUM 8.1*  --  8.3*  MG  --  1.8  --    Liver Function Tests  Recent Labs  04/02/16 0830  AST 36  ALT 19  ALKPHOS 77  BILITOT 1.0  PROT 7.5  ALBUMIN 3.9    Recent Labs  04/02/16 0830  LIPASE 19   Cardiac Enzymes  Recent Labs  04/02/16 2050 04/03/16 0318 04/03/16 0921  TROPONINI 0.84* 1.03* 0.69*   BNP Invalid input(s): POCBNP D-Dimer No results for input(s): DDIMER in the last 72 hours. Hemoglobin A1C  Recent Labs  04/03/16 0922  HGBA1C 7.5*   Fasting Lipid Panel No results for input(s): CHOL, HDL, LDLCALC, TRIG, CHOLHDL, LDLDIRECT in the last 72 hours. Thyroid Function Tests  Recent Labs  04/02/16 1525  TSH 0.352    TELE  afib with rate control HR 90s  Radiology/Studies  Ct Abdomen Pelvis Wo Contrast  04/02/2016  CLINICAL DATA:  Left lower quadrant pain, epigastric pain and nausea starting Sunday EXAM: CT ABDOMEN AND PELVIS WITHOUT CONTRAST TECHNIQUE: Multidetector CT imaging  of the abdomen and pelvis was performed following the standard protocol without IV contrast. COMPARISON:  02/03/2007 FINDINGS: Lower chest: Lung bases shows no infiltrate or pulmonary edema. Mild bronchitic changes are noted bilateral lower lobe. Hepatobiliary: The unenhanced liver shows no biliary ductal dilatation. No calcified gallstones are noted within gallbladder. No CBD dilatation. Pancreas: The unenhanced pancreas is unremarkable. Spleen: Unenhanced spleen is unremarkable. Adrenals/Urinary Tract: Bilateral adrenal adenomas are stable. On the right side measures 2.5 cm. Left adrenal adenoma measures 1.6 cm. There is significant left perinephric stranding. Moderate left hydronephrosis. Stable cortical scarring and small calculi in upper pole of the left kidney. Axial image 36 there is 8 mm calcified obstructive calculus in left UPJ. Nonobstructive calculus in midpole of the left kidney posterior aspect measures 4 mm. No right  nephrolithiasis. No right hydronephrosis or hydroureter. Bilateral distal ureter is small caliber. There is a probable cyst in lower pole posterior aspect of the right kidney measures 1.8 cm. No calcified calculi are noted within urinary bladder. Stomach/Bowel: Small hiatal hernia. No gastric outlet obstruction. No small bowel obstruction. Oral contrast material was given to the patient. Terminal ileum is unremarkable. No pericecal inflammation. Normal appendix is noted in axial image 52. No distal colonic obstruction. Few colonic diverticula are noted descending colon and proximal sigmoid colon. No evidence of colitis or diverticulitis. Vascular/Lymphatic: Atherosclerotic calcifications of abdominal aorta and iliac arteries. No aortic aneurysm. No retroperitoneal or mesenteric adenopathy. Reproductive: Unenhanced uterus is atrophic.  No adnexal mass. Other: No ascites or free air. There is a tiny umbilical hernia containing fat without evidence of acute complication. Small right inguinal canal hernia containing fat measures 2 cm without evidence of acute complication. Musculoskeletal: No destructive bony lesions are noted. Mild levoscoliosis lumbar spine. Sagittal images of the spine shows significant degenerative changes lumbar spine and lower thoracic spine. Multilevel vacuum disc phenomenon anterior and posterior spurring noted lower thoracic and lumbar spine. IMPRESSION: 1. There is moderate left hydronephrosis. Mild left perinephric stranding. 2. Axial image 36 there is 8 mm calcified obstructive calculus in left UPJ. Nonobstructive calculus in mid to lower pole of the left kidney posterior aspect measures 4 mm. Again noted significant scarring and small cortical calcifications in upper pole of the left kidney. 3. No right nephrolithiasis. Probable cyst in lower pole of the right kidney measures 1.9 cm. Bilateral distal ureter is unremarkable. No right hydronephrosis or hydroureter. 4. Normal appendix.  No  pericecal inflammation. 5. Extensive degenerative changes thoracolumbar spine. 6. No calcified calculi are noted within urinary bladder. 7. Atrophic uterus. 8. Stable bilateral adrenal gland adenomas. Electronically Signed   By: Lahoma Crocker M.D.   On: 04/02/2016 11:02   Dg Chest 2 View  04/02/2016  CLINICAL DATA:  Cough, congestion, shortness of breath for 3 days EXAM: CHEST  2 VIEW COMPARISON:  01/31/2005 FINDINGS: Cardiomediastinal silhouette is stable. No infiltrate or pleural effusion. No pulmonary edema. Mild perihilar bronchitic changes. Mild degenerative changes thoracic spine. IMPRESSION: No infiltrate or pulmonary edema. Mild perihilar bronchitic changes. Degenerative changes thoracic spine. Electronically Signed   By: Lahoma Crocker M.D.   On: 04/02/2016 09:13    2D ECHO: 04/03/2016 LV EF: 55% - 60% Study Conclusions - Left ventricle: The cavity size was normal. There was mild focal  basal hypertrophy of the septum. Systolic function was normal.  The estimated ejection fraction was in the range of 55% to 60%.  Wall motion was normal; there were no regional wall motion  abnormalities. Left ventricular diastolic  function parameters  were normal. - Aortic valve: Trileaflet; moderately thickened, moderately  calcified leaflets. - Left atrium: The atrium was mildly dilated.   ASSESSMENT AND PLAN  MINAHIL HULTS is a 75 y.o. female with a history of HTN, DMT2, HLD, nephrolithiasis and no prior cardiac history who presented to [facility] on 04/02/16 with flank pain and n/v. She was found to have sepsis 2/2 acute pyelonephritis and obstructive uropathy. She was also found to have afib with RVR and troponin elevation and cardiology was consulted.   Newly diagnosed Afib with RVR: HR under good control on cardizem gtt at 62mcg/hr. Will switch to PO Cardizem CD 180mg  (as BP mildly elevated) -- TSH normal. Likely precipitated by sepsis. -- CHADSvasc score 5 (HTN, age, F sex, DM). Plan to  start Heparin/coumadin bridge when cleared by urology. Not a candidate for NOAC with elevated Creat/low GFR.  -- 2D ECHO 04/03/16 with mild focal basal hypertrophy of septum. EF 55-60%, no RWMA, mild LAE.  -- Would likely plan for rate control since she is asymptomatic. If she remains in afib we could consider DCCV after at least 5 weeks of theraputic INRs   Troponin elevation: 0.25--> 1.03--> 0.69. likely demand in the setting of sepsis, acute kidney injury and afib with rvr. She has had no chest pain. Likely outpatient myoview if EF normal on ECHO  AKI: creat up to 2.62--> 2.02--> 1.47 today. Unclear baseline. Crea 1.32 in 2008.    HTN: Home meds spiro-HCTZ 25-25mg  held. BPs have been mildly elevated. I have converted dilt gtt to Cardizem 180mg  daily. Hopefully this will control both BP and HR.   HLD: continue crestor  Diet controlled T2DM: a1c 7.5  Sepsis: presented with fever, WBC 20,500, creatinine of 2.62, troponin of 0.25, lactic acid of 2.3, heart rates in the 160s. Source of infection likely from pyelonephritis in setting of obstructive uropathy. Improving.  Acute pyelonephritis/obstructive uropathy/AKI. CT scan that showed an 8 mm calcified obstructive calculus in the left UPJ with moderate left hydronephrosis. S/p percutaneous drainage catheter on 5/16.   Signed, Angelena Form PA-C  Pager [phone]  Patient examined chart reviewed. Agree with conversion to oral cardizem No chest pain or indication for inpatient CAD evaluation consider outpatient myvoue. Start oral anticoagulation when ok with urology.  Exam unchanged Nephrostomy tube less tender  Jenkins Rouge

## 2016-04-04 NOTE — Progress Notes (Signed)
Urology Follow Up-Consult Note  Assessment:  Clinically improving and transferred to floor today. WBC normalized this AM. VSS over past 24 hours. AKI improving. +Enterobacteriaceae in 5/16 BCx and likely in UCx as well.  Plan:  - Continue NT to drainage - Agree with continued ceftriaxone - Follow up UCx and repeat blood cultures - Will eventually need left ureteroscopic stone extraction with stent placement and NT removal in the future following full treatment for sepsis of urinary origin following discharge  Subjective: Patient reports feeling better today. Transferred from ICU to floor this afternoon. Denies flank pain. Perc tube draining well clear yellow urine. Tmax 99.5 with some tachycardia yesterday which has resolved. Otherwise VSS. Weaned from zosyn to ceftriaxone last night. WBC dropped from 14.7 to 9.4 this AM. Creatinine improved to 1.47 from 2.02. 5/16 UCx positive for GNRs. 5/16 BCx +Enterobacteriaceae. Repeat BCx 5/18 pending.   Objective: Vital signs in last 24 hours: Temp:  [98.2 F (36.8 C)-99.5 F (37.5 C)] 98.2 F (36.8 C) (05/18 1118) Pulse Rate:  [86-117] 87 (05/18 1118) Resp:  [17-25] 18 (05/18 1118) BP: (134-160)/(57-78) 160/78 mmHg (05/18 1118) SpO2:  [92 %-98 %] 98 % (05/18 1118)A  Intake/Output from previous day: 05/17 0701 - 05/18 0700 In: 2585 [P.O.:120; I.V.:2415; IV Piggyback:50] Out: [id number] [Urine:1450] Intake/Output this shift: Total I/O In: 520 [P.O.:100; I.V.:420] Out: 550 [Urine:550]  Past Medical History  Diagnosis Date  . Diabetes mellitus without complication ([place])   . Arthritis   . Hyperlipidemia     Physical Exam:  General - No acute disterss Lungs - Normal respiratory effort, chest expands symmetrically. Room air Abdomen - Soft, non-tender & non-distended. GU: No CVA tenderness. Left NT with clear yellow urine in tubing.  Lab Results:  Recent Labs  04/02/16 0830 04/03/16 0318 04/04/16 0327  WBC 20.5* 14.7* 9.4  HGB 12.6  10.2* 9.7*  HCT 38.3 32.4* 30.5*   BMET  Recent Labs  04/03/16 0318 04/04/16 0327  NA 139 137  K 3.5 3.6  CL 108 110  CO2 21* 21*  GLUCOSE 203* 160*  BUN 29* 21*  CREATININE 2.02* 1.47*  CALCIUM 8.1* 8.3*   No results for input(s): LABURIN in the last 72 hours. Results for orders placed or performed during the hospital encounter of 04/02/16  Urine culture     Status: Abnormal   Collection Time: 04/02/16  8:24 AM  Result Value Ref Range Status   Specimen Description URINE, CLEAN CATCH  Final   Special Requests NONE  Final   Culture MULTIPLE SPECIES PRESENT, SUGGEST RECOLLECTION (A)  Final   Report Status 04/03/2016 FINAL  Final  Culture, blood (routine x 2)     Status: None (Preliminary result)   Collection Time: 04/02/16 11:38 AM  Result Value Ref Range Status   Specimen Description BLOOD RIGHT ANTECUBITAL  Final   Special Requests BOTTLES DRAWN AEROBIC AND ANAEROBIC 5ML  Final   Culture  Setup Time   Final    GRAM NEGATIVE RODS ANAEROBIC BOTTLE ONLY CRITICAL RESULT CALLED TO, READ BACK BY AND VERIFIED WITH: Chales Abrahams, [place] AT [place] ON [id number] BY Rhea Bleacher    Culture   Final    NO GROWTH 1 DAY Performed at [facility]    Report Status PENDING  Incomplete  Culture, blood (routine x 2)     Status: None (Preliminary result)   Collection Time: 04/02/16 11:48 AM  Result Value Ref Range Status   Specimen Description BLOOD RIGHT HAND  Final  Special Requests BOTTLES DRAWN AEROBIC ONLY 5ML  Final   Culture  Setup Time   Final    GRAM NEGATIVE RODS AEROBIC BOTTLE ONLY Organism ID to follow CRITICAL RESULT CALLED TO, READ BACK BY AND VERIFIED WITH: Sherilyn Cooter D AT [place] ON [id number] BY Rhea Bleacher Performed at [place]  Final   Report Status PENDING  Incomplete  Blood Culture ID Panel (Reflexed)     Status: Abnormal   Collection Time: 04/02/16 11:48 AM  Result Value Ref Range Status   Enterococcus species NOT  DETECTED NOT DETECTED Final   Vancomycin resistance NOT DETECTED NOT DETECTED Final   Listeria monocytogenes NOT DETECTED NOT DETECTED Final   Staphylococcus species NOT DETECTED NOT DETECTED Final   Staphylococcus aureus NOT DETECTED NOT DETECTED Final   Methicillin resistance NOT DETECTED NOT DETECTED Final   Streptococcus species NOT DETECTED NOT DETECTED Final   Streptococcus agalactiae NOT DETECTED NOT DETECTED Final   Streptococcus pneumoniae NOT DETECTED NOT DETECTED Final   Streptococcus pyogenes NOT DETECTED NOT DETECTED Final   Acinetobacter baumannii NOT DETECTED NOT DETECTED Final   Enterobacteriaceae species DETECTED (A) NOT DETECTED Final    Comment: CRITICAL RESULT CALLED TO, READ BACK BY AND VERIFIED WITH: N. GLOGOVAC, PHARM D AT 1307 ON [id number] BY S. YARBROUGH    Enterobacter cloacae complex NOT DETECTED NOT DETECTED Final   Escherichia coli NOT DETECTED NOT DETECTED Final   Klebsiella oxytoca NOT DETECTED NOT DETECTED Final   Klebsiella pneumoniae NOT DETECTED NOT DETECTED Final   Proteus species NOT DETECTED NOT DETECTED Final   Serratia marcescens NOT DETECTED NOT DETECTED Final   Carbapenem resistance NOT DETECTED NOT DETECTED Final   Haemophilus influenzae NOT DETECTED NOT DETECTED Final   Neisseria meningitidis NOT DETECTED NOT DETECTED Final   Pseudomonas aeruginosa NOT DETECTED NOT DETECTED Final   Candida albicans NOT DETECTED NOT DETECTED Final   Candida glabrata NOT DETECTED NOT DETECTED Final   Candida krusei NOT DETECTED NOT DETECTED Final   Candida parapsilosis NOT DETECTED NOT DETECTED Final   Candida tropicalis NOT DETECTED NOT DETECTED Final    Comment: Performed at [facility]  MRSA PCR Screening     Status: None   Collection Time: 04/02/16  2:08 PM  Result Value Ref Range Status   MRSA by PCR NEGATIVE NEGATIVE Final    Comment:        The GeneXpert MRSA Assay (FDA approved for NASAL specimens only), is one component of a comprehensive  MRSA colonization surveillance program. It is not intended to diagnose MRSA infection nor to guide or monitor treatment for MRSA infections.   Culture, Urine     Status: Abnormal (Preliminary result)   Collection Time: 04/02/16  6:46 PM  Result Value Ref Range Status   Specimen Description URINE, SUPRAPUBIC aspirated urine  Final   Special Requests NONE  Final   Culture >=100,000 COLONIES/mL GRAM NEGATIVE RODS (A)  Final   Report Status PENDING  Incomplete    Studies/Results: No results found.    Acie Fredrickson 04/04/2016, 12:19 PM

## 2016-04-04 NOTE — Progress Notes (Signed)
PROGRESS NOTE  KIRTI MOYNAHAN [id number] DOB: 1940-11-29 DOA: 04/02/2016 PCP: Elyn Peers, MD  HPI/Recap of past 24 hours:  Reported jaw tightness and not able to chew, left sided temporal headache, no vision changes, no extremity weakness  S/p nephrostomy tube placement on 5/16, fever resolved, cr and wbc better,  left flank pain resolving, no n/v. Remain in afib, heart rate controlled off cardizem drip, denies chest pain, no sob, no room air  Assessment/Plan: Principal Problem:   Sepsis ([place]) Active Problems:   AKI (acute kidney injury) ([place])   Pyelonephritis, acute   Atrial fibrillation with RVR ([place])  Sepsis:  Present on admission, evidenced by fever, a white count of 20,500, creatinine of 2.62, troponin of 0.25, lactic acid of 2.3, heart rates in the 160s. Source of infection likely from pyelonephritis in setting of obstructive uropathy. And bacteremia  on the sepsis protocol follow repeat lactic acid level, obtain blood cultures 2 sites, urine cultures, start empiric IV antibiotic therapy with ceftriaxone 2 g IV every 24 hours. admitted to the stepdown unit initially, better, transfer to med tele on 5/18  Acute pyelonephritis/obstructive uropathy/AKI. She presents septic, having left leg pain and nausea and vomiting workup in the emergency room included a CT scan that showed an 8 mm calcified obstructive calculus in the left UPJ. This was associated with moderate left hydronephrosis. As mentioned above initiating empiric IV antibiotic therapy with ceftriaxone 2 g IV every 24 hours. Dr. Gaynelle Arabian of urology who advised to  have interventional radiology place a percutaneous drainage catheter which was done on 5/16 Cr /wbc better  Bacteremia: on abx, repeat blood culture  Afib/rvr: new diagnose.  tsh 0.3, keep k>4, mag >2,  CHADSvasc score 4 with age, female, h/o diet controlled dm2. Was not previously on meds or anticoagulation, cardizem drip started from admission,   meds and anticoagulation per cardiology,   Troponin elevation: in the setting of sepsis/aki/afib, denies chest pain, bp stable. ekg afib/non specific st/t flattening. Echo pending, cardiology consulted.  H/o HTN;  Home meds aldactazide held  H/o hld: continue crestor  H/o diet controlled dm2, a1c 7.5  Oral thrush: topical nystatin  Code Status: full  Family Communication: patient   Disposition Plan: out of  Stepdown to med tele on 5/18   Consultants:  Urology for nephrolithiasis and obstructive uropathy  IR for perc drain  Cardiology for afib (new diagnose), troponin elevation  Procedures:  Left PCN insertion on 5/16 by IR  Antibiotics:  Rocephin from admission    Objective: BP 155/64 mmHg  Pulse 94  Temp(Src) 99.3 F (37.4 C) (Oral)  Resp 17  Ht 5\' 4"  (1.626 m)  Wt 73.8 kg (162 lb 11.2 oz)  BMI 27.91 kg/m2  SpO2 95%  Intake/Output Summary (Last 24 hours) at 04/04/16 0941 Last data filed at 04/04/16 0900  Gross per 24 hour  Intake   2670 ml  Output   2000 ml  Net    670 ml   Filed Weights   04/02/16 0757 04/02/16 1400  Weight: 79.379 kg (175 lb) 73.8 kg (162 lb 11.2 oz)    Exam:   General:  NAD, weak   Cardiovascular: IRRR  Respiratory: CTABL  Abdomen: Soft/ND/NT, positive BS, left sided nephrostomy tube in place.  Musculoskeletal: No Edema  Neuro: aaox3   Data Reviewed: Basic Metabolic Panel:  Recent Labs Lab 04/02/16 0830 04/03/16 0318 04/03/16 0928 04/04/16 0327  NA 136 139  --  137  K 4.1 3.5  --  3.6  CL 100* 108  --  110  CO2 24 21*  --  21*  GLUCOSE 200* 203*  --  160*  BUN 45* 29*  --  21*  CREATININE 2.62* 2.02*  --  1.47*  CALCIUM 9.1 8.1*  --  8.3*  MG  --   --  1.8  --    Liver Function Tests:  Recent Labs Lab 04/02/16 0830  AST 36  ALT 19  ALKPHOS 77  BILITOT 1.0  PROT 7.5  ALBUMIN 3.9    Recent Labs Lab 04/02/16 0830  LIPASE 19   No results for input(s): AMMONIA in the last 168  hours. CBC:  Recent Labs Lab 04/02/16 0830 04/03/16 0318 04/04/16 0327  WBC 20.5* 14.7* 9.4  NEUTROABS 19.1*  --   --   HGB 12.6 10.2* 9.7*  HCT 38.3 32.4* 30.5*  MCV 84.2 85.7 84.0  PLT 144* 136* 151   Cardiac Enzymes:    Recent Labs Lab 04/02/16 1525 04/02/16 2050 04/03/16 0318 04/03/16 0921  TROPONINI 0.39* 0.84* 1.03* 0.69*   BNP (last 3 results) No results for input(s): BNP in the last 8760 hours.  ProBNP (last 3 results) No results for input(s): PROBNP in the last 8760 hours.  CBG:  Recent Labs Lab 04/03/16 0800 04/03/16 1219 04/03/16 1551 04/03/16 2134 04/04/16 0749  GLUCAP 169* 146* 153* 136* 169*    Recent Results (from the past 240 hour(s))  Urine culture     Status: Abnormal   Collection Time: 04/02/16  8:24 AM  Result Value Ref Range Status   Specimen Description URINE, CLEAN CATCH  Final   Special Requests NONE  Final   Culture MULTIPLE SPECIES PRESENT, SUGGEST RECOLLECTION (A)  Final   Report Status 04/03/2016 FINAL  Final  Culture, blood (routine x 2)     Status: None (Preliminary result)   Collection Time: 04/02/16 11:38 AM  Result Value Ref Range Status   Specimen Description BLOOD RIGHT ANTECUBITAL  Final   Special Requests BOTTLES DRAWN AEROBIC AND ANAEROBIC 5ML  Final   Culture  Setup Time   Final    GRAM NEGATIVE RODS ANAEROBIC BOTTLE ONLY CRITICAL RESULT CALLED TO, READ BACK BY AND VERIFIED WITH: Chales Abrahams, PHARM AT [place] ON [id number] BY Rhea Bleacher    Culture   Final    NO GROWTH 1 DAY Performed at [facility]    Report Status PENDING  Incomplete  Culture, blood (routine x 2)     Status: None (Preliminary result)   Collection Time: 04/02/16 11:48 AM  Result Value Ref Range Status   Specimen Description BLOOD RIGHT HAND  Final   Special Requests BOTTLES DRAWN AEROBIC ONLY 5ML  Final   Culture  Setup Time   Final    GRAM NEGATIVE RODS AEROBIC BOTTLE ONLY Organism ID to follow CRITICAL RESULT CALLED TO, READ BACK BY AND  VERIFIED WITH: Guadlupe Spanish, [place] D AT [place] ON [id number] BY Rhea Bleacher    Culture   Final    NO GROWTH 1 DAY Performed at [facility]    Report Status PENDING  Incomplete  Blood Culture ID Panel (Reflexed)     Status: Abnormal   Collection Time: 04/02/16 11:48 AM  Result Value Ref Range Status   Enterococcus species NOT DETECTED NOT DETECTED Final   Vancomycin resistance NOT DETECTED NOT DETECTED Final   Listeria monocytogenes NOT DETECTED NOT DETECTED Final   Staphylococcus species NOT DETECTED NOT DETECTED Final   Staphylococcus  aureus NOT DETECTED NOT DETECTED Final   Methicillin resistance NOT DETECTED NOT DETECTED Final   Streptococcus species NOT DETECTED NOT DETECTED Final   Streptococcus agalactiae NOT DETECTED NOT DETECTED Final   Streptococcus pneumoniae NOT DETECTED NOT DETECTED Final   Streptococcus pyogenes NOT DETECTED NOT DETECTED Final   Acinetobacter baumannii NOT DETECTED NOT DETECTED Final   Enterobacteriaceae species DETECTED (A) NOT DETECTED Final    Comment: CRITICAL RESULT CALLED TO, READ BACK BY AND VERIFIED WITH: N. GLOGOVAC, PHARM D AT 1307 ON [id number] BY S. YARBROUGH    Enterobacter cloacae complex NOT DETECTED NOT DETECTED Final   Escherichia coli NOT DETECTED NOT DETECTED Final   Klebsiella oxytoca NOT DETECTED NOT DETECTED Final   Klebsiella pneumoniae NOT DETECTED NOT DETECTED Final   Proteus species NOT DETECTED NOT DETECTED Final   Serratia marcescens NOT DETECTED NOT DETECTED Final   Carbapenem resistance NOT DETECTED NOT DETECTED Final   Haemophilus influenzae NOT DETECTED NOT DETECTED Final   Neisseria meningitidis NOT DETECTED NOT DETECTED Final   Pseudomonas aeruginosa NOT DETECTED NOT DETECTED Final   Candida albicans NOT DETECTED NOT DETECTED Final   Candida glabrata NOT DETECTED NOT DETECTED Final   Candida krusei NOT DETECTED NOT DETECTED Final   Candida parapsilosis NOT DETECTED NOT DETECTED Final   Candida tropicalis NOT DETECTED  NOT DETECTED Final    Comment: Performed at [facility]  MRSA PCR Screening     Status: None   Collection Time: 04/02/16  2:08 PM  Result Value Ref Range Status   MRSA by PCR NEGATIVE NEGATIVE Final    Comment:        The GeneXpert MRSA Assay (FDA approved for NASAL specimens only), is one component of a comprehensive MRSA colonization surveillance program. It is not intended to diagnose MRSA infection nor to guide or monitor treatment for MRSA infections.   Culture, Urine     Status: Abnormal (Preliminary result)   Collection Time: 04/02/16  6:46 PM  Result Value Ref Range Status   Specimen Description URINE, SUPRAPUBIC aspirated urine  Final   Special Requests NONE  Final   Culture >=100,000 COLONIES/mL GRAM NEGATIVE RODS (A)  Final   Report Status PENDING  Incomplete     Studies: No results found.  Scheduled Meds: . cefTRIAXone (ROCEPHIN)  IV  2 g Intravenous Q24H  . diltiazem  180 mg Oral Daily  . heparin  5,000 Units Subcutaneous Q8H  . insulin aspart  0-15 Units Subcutaneous TID WC  . potassium chloride  40 mEq Oral Once  . rosuvastatin  20 mg Oral Daily  . sodium chloride flush  3 mL Intravenous Q12H    Continuous Infusions: . sodium chloride 100 mL/hr at 04/04/16 0854     Time spent: 40mins  Hakiem Malizia MD, PhD  [facility] Pager [phone]. If 7PM-7AM, please contact night-coverage at [email or web address], password [facility] 04/04/2016, 9:41 AM  LOS: 2 days

## 2016-04-04 NOTE — Progress Notes (Signed)
Referring Physician(s): Dr Carolan Clines  Supervising Physician: Sandi Mariscal  Patient Status: In-pt  Chief Complaint:  Left PCN placed 5/16 Nephrolithiasis  Subjective:  Not feeling real well this am PCN is in place and draining well Cr 1.47 today  Allergies: Review of patient's allergies indicates no known allergies.  Medications: Prior to Admission medications   Medication Sig Start Date End Date Taking? Authorizing Provider  rosuvastatin (CRESTOR) 20 MG tablet Take 20 mg by mouth daily.   Yes Historical Provider, MD  spironolactone-hydrochlorothiazide (ALDACTAZIDE) 25-25 MG tablet Take 1 tablet by mouth daily. 03/23/16  Yes Historical Provider, MD  fluticasone (FLONASE) 50 MCG/ACT nasal spray Place 2 sprays into the nose daily. Patient not taking: Reported on 04/02/2016 08/13/13   Darlyne Russian, MD     Vital Signs: BP 155/64 mmHg  Pulse 94  Temp(Src) 99.3 F (37.4 C) (Oral)  Resp 17  Ht 5\' 4"  (1.626 m)  Wt 162 lb 11.2 oz (73.8 kg)  BMI 27.91 kg/m2  SpO2 95%  Physical Exam  Abdominal: Soft.  Skin: Skin is warm and dry.  Site of L PCN clean and dry Sl tender No bleeding - no sign of infection Output 500 cc yesterday 250 cc today---pale yellow  Cr 1.47 (2.02) 99.3    Nursing note and vitals reviewed.   Imaging: Ct Abdomen Pelvis Wo Contrast  04/02/2016  CLINICAL DATA:  Left lower quadrant pain, epigastric pain and nausea starting Sunday EXAM: CT ABDOMEN AND PELVIS WITHOUT CONTRAST TECHNIQUE: Multidetector CT imaging of the abdomen and pelvis was performed following the standard protocol without IV contrast. COMPARISON:  02/03/2007 FINDINGS: Lower chest: Lung bases shows no infiltrate or pulmonary edema. Mild bronchitic changes are noted bilateral lower lobe. Hepatobiliary: The unenhanced liver shows no biliary ductal dilatation. No calcified gallstones are noted within gallbladder. No CBD dilatation. Pancreas: The unenhanced pancreas is unremarkable.  Spleen: Unenhanced spleen is unremarkable. Adrenals/Urinary Tract: Bilateral adrenal adenomas are stable. On the right side measures 2.5 cm. Left adrenal adenoma measures 1.6 cm. There is significant left perinephric stranding. Moderate left hydronephrosis. Stable cortical scarring and small calculi in upper pole of the left kidney. Axial image 36 there is 8 mm calcified obstructive calculus in left UPJ. Nonobstructive calculus in midpole of the left kidney posterior aspect measures 4 mm. No right nephrolithiasis. No right hydronephrosis or hydroureter. Bilateral distal ureter is small caliber. There is a probable cyst in lower pole posterior aspect of the right kidney measures 1.8 cm. No calcified calculi are noted within urinary bladder. Stomach/Bowel: Small hiatal hernia. No gastric outlet obstruction. No small bowel obstruction. Oral contrast material was given to the patient. Terminal ileum is unremarkable. No pericecal inflammation. Normal appendix is noted in axial image 52. No distal colonic obstruction. Few colonic diverticula are noted descending colon and proximal sigmoid colon. No evidence of colitis or diverticulitis. Vascular/Lymphatic: Atherosclerotic calcifications of abdominal aorta and iliac arteries. No aortic aneurysm. No retroperitoneal or mesenteric adenopathy. Reproductive: Unenhanced uterus is atrophic.  No adnexal mass. Other: No ascites or free air. There is a tiny umbilical hernia containing fat without evidence of acute complication. Small right inguinal canal hernia containing fat measures 2 cm without evidence of acute complication. Musculoskeletal: No destructive bony lesions are noted. Mild levoscoliosis lumbar spine. Sagittal images of the spine shows significant degenerative changes lumbar spine and lower thoracic spine. Multilevel vacuum disc phenomenon anterior and posterior spurring noted lower thoracic and lumbar spine. IMPRESSION: 1. There is moderate left hydronephrosis.  Mild  left perinephric stranding. 2. Axial image 36 there is 8 mm calcified obstructive calculus in left UPJ. Nonobstructive calculus in mid to lower pole of the left kidney posterior aspect measures 4 mm. Again noted significant scarring and small cortical calcifications in upper pole of the left kidney. 3. No right nephrolithiasis. Probable cyst in lower pole of the right kidney measures 1.9 cm. Bilateral distal ureter is unremarkable. No right hydronephrosis or hydroureter. 4. Normal appendix.  No pericecal inflammation. 5. Extensive degenerative changes thoracolumbar spine. 6. No calcified calculi are noted within urinary bladder. 7. Atrophic uterus. 8. Stable bilateral adrenal gland adenomas. Electronically Signed   By: Lahoma Crocker M.D.   On: 04/02/2016 11:02   Dg Chest 2 View  04/02/2016  CLINICAL DATA:  Cough, congestion, shortness of breath for 3 days EXAM: CHEST  2 VIEW COMPARISON:  01/31/2005 FINDINGS: Cardiomediastinal silhouette is stable. No infiltrate or pleural effusion. No pulmonary edema. Mild perihilar bronchitic changes. Mild degenerative changes thoracic spine. IMPRESSION: No infiltrate or pulmonary edema. Mild perihilar bronchitic changes. Degenerative changes thoracic spine. Electronically Signed   By: Lahoma Crocker M.D.   On: 04/02/2016 09:13    Labs:  CBC:  Recent Labs  04/02/16 0830 04/03/16 0318 04/04/16 0327  WBC 20.5* 14.7* 9.4  HGB 12.6 10.2* 9.7*  HCT 38.3 32.4* 30.5*  PLT 144* 136* 151    COAGS:  Recent Labs  04/02/16 1239 04/02/16 1525  INR 1.14 1.13  APTT 27 28    BMP:  Recent Labs  04/02/16 0830 04/03/16 0318 04/04/16 0327  NA 136 139 137  K 4.1 3.5 3.6  CL 100* 108 110  CO2 24 21* 21*  GLUCOSE 200* 203* 160*  BUN 45* 29* 21*  CALCIUM 9.1 8.1* 8.3*  CREATININE 2.62* 2.02* 1.47*  GFRNONAA 17* 23* 33*  GFRAA 19* 26* 39*    LIVER FUNCTION TESTS:  Recent Labs  04/02/16 0830  BILITOT 1.0  AST 36  ALT 19  ALKPHOS 77  PROT 7.5  ALBUMIN 3.9      Assessment and Plan:  Nephrolithiasis Hydronephrosis L Percutaneous nephrostomy placed 5/16 Draining well Plan per Dr Gaynelle Arabian  Electronically Signed: Monia Sabal A 04/04/2016, 10:32 AM   I spent a total of 15 Minutes at the the patient's bedside AND on the patient's hospital floor or unit, greater than 50% of which was counseling/coordinating care for L PCN

## 2016-04-05 DIAGNOSIS — R7881 Bacteremia: Secondary | ICD-10-CM

## 2016-04-05 DIAGNOSIS — N131 Hydronephrosis with ureteral stricture, not elsewhere classified: Secondary | ICD-10-CM

## 2016-04-05 LAB — CULTURE, BLOOD (ROUTINE X 2)

## 2016-04-05 LAB — CBC
HCT: 30.5 % — ABNORMAL LOW (ref 36.0–46.0)
Hemoglobin: 9.9 g/dL — ABNORMAL LOW (ref 12.0–15.0)
MCH: 27 pg (ref 26.0–34.0)
MCHC: 32.5 g/dL (ref 30.0–36.0)
MCV: 83.3 fL (ref 78.0–100.0)
PLATELETS: 180 10*3/uL (ref 150–400)
RBC: 3.66 MIL/uL — AB (ref 3.87–5.11)
RDW: 14.5 % (ref 11.5–15.5)
WBC: 8 10*3/uL (ref 4.0–10.5)

## 2016-04-05 LAB — URINE CULTURE

## 2016-04-05 LAB — MAGNESIUM: Magnesium: 1.5 mg/dL — ABNORMAL LOW (ref 1.7–2.4)

## 2016-04-05 LAB — GLUCOSE, CAPILLARY
GLUCOSE-CAPILLARY: 155 mg/dL — AB (ref 65–99)
Glucose-Capillary: 132 mg/dL — ABNORMAL HIGH (ref 65–99)
Glucose-Capillary: 125 mg/dL — ABNORMAL HIGH (ref 65–99)
Glucose-Capillary: 164 mg/dL — ABNORMAL HIGH (ref 65–99)

## 2016-04-05 LAB — BASIC METABOLIC PANEL
Anion gap: 7 (ref 5–15)
BUN: 11 mg/dL (ref 6–20)
CO2: 21 mmol/L — ABNORMAL LOW (ref 22–32)
CREATININE: 1.35 mg/dL — AB (ref 0.44–1.00)
Calcium: 8.3 mg/dL — ABNORMAL LOW (ref 8.9–10.3)
Chloride: 108 mmol/L (ref 101–111)
GFR calc Af Amer: 43 mL/min — ABNORMAL LOW (ref >60)
GFR, EST NON AFRICAN AMERICAN: 37 mL/min — AB (ref >60)
GLUCOSE: 188 mg/dL — AB (ref 65–99)
POTASSIUM: 3.6 mmol/L (ref 3.5–5.1)
Sodium: 136 mmol/L (ref 135–145)

## 2016-04-05 LAB — HEPARIN LEVEL (UNFRACTIONATED): HEPARIN UNFRACTIONATED: 0.19 [IU]/mL — AB (ref 0.30–0.70)

## 2016-04-05 MED ORDER — WARFARIN - PHARMACIST DOSING INPATIENT: Freq: Every day | Status: DC

## 2016-04-05 MED ORDER — MAGNESIUM SULFATE 2 GM/50ML IV SOLN
2.0000 g | Freq: Once | INTRAVENOUS | Status: AC
  Administered 2016-04-05: 2 g via INTRAVENOUS
  Filled 2016-04-05: qty 50

## 2016-04-05 MED ORDER — HEPARIN (PORCINE) IN NACL 100-0.45 UNIT/ML-% IJ SOLN
950.0000 [IU]/h | INTRAMUSCULAR | Status: DC
  Administered 2016-04-05: 950 [IU]/h via INTRAVENOUS
  Filled 2016-04-05: qty 250

## 2016-04-05 MED ORDER — DILTIAZEM HCL ER COATED BEADS 240 MG PO CP24
240.0000 mg | ORAL_CAPSULE | Freq: Every day | ORAL | Status: DC
  Administered 2016-04-05 – 2016-04-07 (×3): 240 mg via ORAL
  Filled 2016-04-05 (×3): qty 1

## 2016-04-05 MED ORDER — WARFARIN SODIUM 5 MG PO TABS
5.0000 mg | ORAL_TABLET | Freq: Once | ORAL | Status: AC
  Administered 2016-04-05: 5 mg via ORAL
  Filled 2016-04-05: qty 1

## 2016-04-05 MED ORDER — HEPARIN (PORCINE) IN NACL 100-0.45 UNIT/ML-% IJ SOLN
1150.0000 [IU]/h | INTRAMUSCULAR | Status: DC
  Administered 2016-04-06: 1000 [IU]/h via INTRAVENOUS
  Administered 2016-04-07: 1150 [IU]/h via INTRAVENOUS
  Filled 2016-04-05 (×2): qty 250

## 2016-04-05 NOTE — Care Management Important Message (Signed)
Important Message  Patient Details  Name: BRENTNEY ALHASSAN MRN: [id number] Date of Birth: [birth year]   Medicare Important Message Given:  Yes    Camillo Flaming 04/05/2016, 9:10 AMImportant Message  Patient Details  Name: MARYELLEN CARROZZA MRN: [id number] Date of Birth: 10/18/1941   Medicare Important Message Given:  Yes    Camillo Flaming 04/05/2016, 9:10 AM

## 2016-04-05 NOTE — Progress Notes (Signed)
Patient Name: Audrey Mullen Date of Encounter: 04/05/2016     Principal Problem:   Sepsis ([place]) Active Problems:   AKI (acute kidney injury) ([place])   Pyelonephritis, acute   Atrial fibrillation with RVR ([place])   Hydronephrosis   Ureteral stone with hydronephrosis    SUBJECTIVE  No complaints. Wants to know when she can have stones out.  CURRENT MEDS . cefTRIAXone (ROCEPHIN)  IV  2 g Intravenous Q24H  . diltiazem  180 mg Oral Daily  . heparin  5,000 Units Subcutaneous Q8H  . insulin aspart  0-15 Units Subcutaneous TID WC  . nystatin  5 mL Oral QID  . rosuvastatin  20 mg Oral Daily  . sodium chloride flush  3 mL Intravenous Q12H    OBJECTIVE  Filed Vitals:   04/04/16 0900 04/04/16 1118 04/04/16 2130 04/05/16 0422  BP: 155/64 160/78 134/97 155/89  Pulse: 94 87 103 9  Temp:  98.2 F (36.8 C) 100.7 F (38.2 C) 99.7 F (37.6 C)  TempSrc:  Oral Oral Oral  Resp: 17 18 18 18   Height:      Weight:      SpO2: 95% 98% 96% 99%    Intake/Output Summary (Last 24 hours) at 04/05/16 0747 Last data filed at 04/05/16 0544  Gross per 24 hour  Intake 3423.33 ml  Output    950 ml  Net 2473.33 ml   Filed Weights   04/02/16 0757 04/02/16 1400  Weight: 175 lb (79.379 kg) 162 lb 11.2 oz (73.8 kg)    PHYSICAL EXAM  General: Pleasant, NAD. Neuro: Alert and oriented X 3. Moves all extremities spontaneously. Psych: Normal affect. HEENT:  Normal  Neck: Supple without bruits or JVD. Lungs:  Resp regular and unlabored, CTA. Heart: irreg irreg no s3, s4, or murmurs. Abdomen: Soft, non-tender, non-distended, BS + x 4. S/p nephrostomy tube  Extremities: No clubbing, cyanosis or edema. DP/PT/Radials 2+ and equal bilaterally.  Accessory Clinical Findings  CBC  Recent Labs  04/02/16 0830  04/04/16 0327 04/05/16 0457  WBC 20.5*  < > 9.4 8.0  NEUTROABS 19.1*  --   --   --   HGB 12.6  < > 9.7* 9.9*  HCT 38.3  < > 30.5* 30.5*  MCV 84.2  < > 84.0 83.3  PLT 144*  < >  151 180  < > = values in this interval not displayed. Basic Metabolic Panel  Recent Labs  04/03/16 0928 04/04/16 0327 04/05/16 0457  NA  --  137 136  K  --  3.6 3.6  CL  --  110 108  CO2  --  21* 21*  GLUCOSE  --  160* 188*  BUN  --  21* 11  CREATININE  --  1.47* 1.35*  CALCIUM  --  8.3* 8.3*  MG 1.8  --  1.5*   Liver Function Tests  Recent Labs  04/02/16 0830  AST 36  ALT 19  ALKPHOS 77  BILITOT 1.0  PROT 7.5  ALBUMIN 3.9    Recent Labs  04/02/16 0830  LIPASE 19   Cardiac Enzymes  Recent Labs  04/02/16 2050 04/03/16 0318 04/03/16 0921  TROPONINI 0.84* 1.03* 0.69*   BNP Invalid input(s): POCBNP D-Dimer No results for input(s): DDIMER in the last 72 hours. Hemoglobin A1C  Recent Labs  04/03/16 0922  HGBA1C 7.5*   Fasting Lipid Panel  Recent Labs  04/04/16 0327  CHOL 178  HDL 24*  LDLCALC 107*  TRIG 237*  CHOLHDL 7.4   Thyroid Function Tests  Recent Labs  04/02/16 1525  TSH 0.352    TELE  afib with rate control HR low 100s  Radiology/Studies  Ct Abdomen Pelvis Wo Contrast  04/02/2016  CLINICAL DATA:  Left lower quadrant pain, epigastric pain and nausea starting Sunday EXAM: CT ABDOMEN AND PELVIS WITHOUT CONTRAST TECHNIQUE: Multidetector CT imaging of the abdomen and pelvis was performed following the standard protocol without IV contrast. COMPARISON:  02/03/2007 FINDINGS: Lower chest: Lung bases shows no infiltrate or pulmonary edema. Mild bronchitic changes are noted bilateral lower lobe. Hepatobiliary: The unenhanced liver shows no biliary ductal dilatation. No calcified gallstones are noted within gallbladder. No CBD dilatation. Pancreas: The unenhanced pancreas is unremarkable. Spleen: Unenhanced spleen is unremarkable. Adrenals/Urinary Tract: Bilateral adrenal adenomas are stable. On the right side measures 2.5 cm. Left adrenal adenoma measures 1.6 cm. There is significant left perinephric stranding. Moderate left hydronephrosis.  Stable cortical scarring and small calculi in upper pole of the left kidney. Axial image 36 there is 8 mm calcified obstructive calculus in left UPJ. Nonobstructive calculus in midpole of the left kidney posterior aspect measures 4 mm. No right nephrolithiasis. No right hydronephrosis or hydroureter. Bilateral distal ureter is small caliber. There is a probable cyst in lower pole posterior aspect of the right kidney measures 1.8 cm. No calcified calculi are noted within urinary bladder. Stomach/Bowel: Small hiatal hernia. No gastric outlet obstruction. No small bowel obstruction. Oral contrast material was given to the patient. Terminal ileum is unremarkable. No pericecal inflammation. Normal appendix is noted in axial image 52. No distal colonic obstruction. Few colonic diverticula are noted descending colon and proximal sigmoid colon. No evidence of colitis or diverticulitis. Vascular/Lymphatic: Atherosclerotic calcifications of abdominal aorta and iliac arteries. No aortic aneurysm. No retroperitoneal or mesenteric adenopathy. Reproductive: Unenhanced uterus is atrophic.  No adnexal mass. Other: No ascites or free air. There is a tiny umbilical hernia containing fat without evidence of acute complication. Small right inguinal canal hernia containing fat measures 2 cm without evidence of acute complication. Musculoskeletal: No destructive bony lesions are noted. Mild levoscoliosis lumbar spine. Sagittal images of the spine shows significant degenerative changes lumbar spine and lower thoracic spine. Multilevel vacuum disc phenomenon anterior and posterior spurring noted lower thoracic and lumbar spine. IMPRESSION: 1. There is moderate left hydronephrosis. Mild left perinephric stranding. 2. Axial image 36 there is 8 mm calcified obstructive calculus in left UPJ. Nonobstructive calculus in mid to lower pole of the left kidney posterior aspect measures 4 mm. Again noted significant scarring and small cortical  calcifications in upper pole of the left kidney. 3. No right nephrolithiasis. Probable cyst in lower pole of the right kidney measures 1.9 cm. Bilateral distal ureter is unremarkable. No right hydronephrosis or hydroureter. 4. Normal appendix.  No pericecal inflammation. 5. Extensive degenerative changes thoracolumbar spine. 6. No calcified calculi are noted within urinary bladder. 7. Atrophic uterus. 8. Stable bilateral adrenal gland adenomas. Electronically Signed   By: Lahoma Crocker M.D.   On: 04/02/2016 11:02   Dg Chest 2 View  04/02/2016  CLINICAL DATA:  Cough, congestion, shortness of breath for 3 days EXAM: CHEST  2 VIEW COMPARISON:  01/31/2005 FINDINGS: Cardiomediastinal silhouette is stable. No infiltrate or pleural effusion. No pulmonary edema. Mild perihilar bronchitic changes. Mild degenerative changes thoracic spine. IMPRESSION: No infiltrate or pulmonary edema. Mild perihilar bronchitic changes. Degenerative changes thoracic spine. Electronically Signed   By: Lahoma Crocker M.D.   On: 04/02/2016 09:13  2D ECHO: 04/03/2016 LV EF: 55% - 60% Study Conclusions - Left ventricle: The cavity size was normal. There was mild focal  basal hypertrophy of the septum. Systolic function was normal.  The estimated ejection fraction was in the range of 55% to 60%.  Wall motion was normal; there were no regional wall motion  abnormalities. Left ventricular diastolic function parameters  were normal. - Aortic valve: Trileaflet; moderately thickened, moderately  calcified leaflets. - Left atrium: The atrium was mildly dilated.   ASSESSMENT AND PLAN  KAYLISE FAUSS is a 75 y.o. female with a history of HTN, DMT2, HLD, nephrolithiasis and no prior cardiac history who presented to [facility] on 04/02/16 with flank pain and n/v. She was found to have sepsis 2/2 acute pyelonephritis and obstructive uropathy. She was also found to have afib with RVR and troponin elevation and cardiology was consulted.     Newly diagnosed Afib with RVR: Switched to PO Cardizem CD 180mg  yesterday. BP still elevated and HR in low 100s. Will increase to 240mg  daily.  -- TSH normal. Likely precipitated by sepsis. -- CHADSvasc score 5 (HTN, age, F sex, DM). Will start heparin/coumadin now. I discussed with Dr. Tresa Moore with urology who said this was fine. -- 2D ECHO 04/03/16 with mild focal basal hypertrophy of septum. EF 55-60%, no RWMA, mild LAE.  -- Would likely plan for rate control since she is asymptomatic. If she remains in afib we could consider DCCV after at least 5 weeks of theraputic INRs   Troponin elevation: 0.25--> 1.03--> 0.69. likely demand in the setting of sepsis, acute kidney injury and afib with rvr. She has had no chest pain. Likely outpatient myoview  AKI: creat up to 2.62--> 2.02--> 1.47--> 1.35 today which is probably her baseline.    HTN: Home meds spiro-HCTZ 25-25mg  held. BPs have been mildly elevated. Will further increase Cardizem CD 180-->240mg  daily.   HLD: continue crestor  Diet controlled T2DM: a1c 7.5  Sepsis: presented with fever, WBC 20,500, creatinine of 2.62, troponin of 0.25, lactic acid of 2.3, heart rates in the 160s. Source of infection likely from pyelonephritis in setting of obstructive uropathy. Improving.  Acute pyelonephritis/obstructive uropathy/AKI. CT scan that showed an 8 mm calcified obstructive calculus in the left UPJ with moderate left hydronephrosis. S/p percutaneous drainage catheter on 5/16.   Signed, Angelena Form PA-C  Pager [phone]    Patient examined chart reviewed. Urine from urostomy tube less tea colored.  Discussed with uroloogy Ok to anticoagulate Start heparin with no bolus coumadin per pharmacy.  CHA2VASC 5.  Increase cardizem for HR/BP contorl  Jenkins Rouge

## 2016-04-05 NOTE — Progress Notes (Signed)
Assessment: stabilizing. She will ned office f/u .   Plan: 1. Leave nephrostomy to straight drainage.            2. Oral antibiotic per IM           3. Return to office for surgery planning ( will need ureteroscopy, laser fragmentation of stone and JJ stent)           4. Will need to stop her coumadin prior to surgery if her heart is normal.   Subjective: Patient reports feeling much better. Believes her heart is in normal rhythm. Urine clear. Wants to go home.   Objective: Vital signs in last 24 hours: Temp:  [99.7 F (37.6 C)-100.7 F (38.2 C)] 99.7 F (37.6 C) (05/19 0422) Pulse Rate:  [9-103] 9 (05/19 0422) Resp:  [18] 18 (05/19 0422) BP: (134-155)/(89-97) 155/89 mmHg (05/19 0422) SpO2:  [96 %-99 %] 99 % (05/19 0422)A  Intake/Output from previous day: 05/18 0701 - 05/19 0700 In: 3423.3 [P.O.:580; I.V.:2393.3] Out: 950 [Urine:950] Intake/Output this shift: Total I/O In: -  Out: 500 [Urine:500]  Past Medical History  Diagnosis Date  . Diabetes mellitus without complication ([place])   . Arthritis   . Hyperlipidemia     Physical Exam:  Lungs - Normal respiratory effort, chest expands symmetrically.  Abdomen - Soft, non-tender & non-distended.  Lab Results:  Recent Labs  04/03/16 0318 04/04/16 0327 04/05/16 0457  WBC 14.7* 9.4 8.0  HGB 10.2* 9.7* 9.9*  HCT 32.4* 30.5* 30.5*   BMET  Recent Labs  04/04/16 0327 04/05/16 0457  NA 137 136  K 3.6 3.6  CL 110 108  CO2 21* 21*  GLUCOSE 160* 188*  BUN 21* 11  CREATININE 1.47* 1.35*  CALCIUM 8.3* 8.3*   No results for input(s): LABURIN in the last 72 hours. Results for orders placed or performed during the hospital encounter of 04/02/16  Urine culture     Status: Abnormal   Collection Time: 04/02/16  8:24 AM  Result Value Ref Range Status   Specimen Description URINE, CLEAN CATCH  Final   Special Requests NONE  Final   Culture MULTIPLE SPECIES PRESENT, SUGGEST RECOLLECTION (A)  Final   Report Status  04/03/2016 FINAL  Final  Culture, blood (routine x 2)     Status: None (Preliminary result)   Collection Time: 04/02/16 11:38 AM  Result Value Ref Range Status   Specimen Description BLOOD RIGHT ANTECUBITAL  Final   Special Requests BOTTLES DRAWN AEROBIC AND ANAEROBIC 5ML  Final   Culture  Setup Time   Final    GRAM NEGATIVE RODS ANAEROBIC BOTTLE ONLY CRITICAL RESULT CALLED TO, READ BACK BY AND VERIFIED WITH: Chales Abrahams, [place] AT [place] ON [id number] BY Rhea Bleacher    Culture   Final    NO GROWTH 2 DAYS Performed at [facility]    Report Status PENDING  Incomplete  Culture, blood (routine x 2)     Status: None (Preliminary result)   Collection Time: 04/02/16 11:48 AM  Result Value Ref Range Status   Specimen Description BLOOD RIGHT HAND  Final   Special Requests BOTTLES DRAWN AEROBIC ONLY 5ML  Final   Culture  Setup Time   Final    GRAM NEGATIVE RODS AEROBIC BOTTLE ONLY Organism ID to follow CRITICAL RESULT CALLED TO, READ BACK BY AND VERIFIED WITH: Sherilyn Cooter D AT [place] ON [id number] BY Rhea Bleacher Performed at [place]  Final   Report Status PENDING  Incomplete  Blood Culture ID Panel (Reflexed)     Status: Abnormal   Collection Time: 04/02/16 11:48 AM  Result Value Ref Range Status   Enterococcus species NOT DETECTED NOT DETECTED Final   Vancomycin resistance NOT DETECTED NOT DETECTED Final   Listeria monocytogenes NOT DETECTED NOT DETECTED Final   Staphylococcus species NOT DETECTED NOT DETECTED Final   Staphylococcus aureus NOT DETECTED NOT DETECTED Final   Methicillin resistance NOT DETECTED NOT DETECTED Final   Streptococcus species NOT DETECTED NOT DETECTED Final   Streptococcus agalactiae NOT DETECTED NOT DETECTED Final   Streptococcus pneumoniae NOT DETECTED NOT DETECTED Final   Streptococcus pyogenes NOT DETECTED NOT DETECTED Final   Acinetobacter baumannii NOT DETECTED NOT DETECTED Final   Enterobacteriaceae species  DETECTED (A) NOT DETECTED Final    Comment: CRITICAL RESULT CALLED TO, READ BACK BY AND VERIFIED WITH: N. GLOGOVAC, PHARM D AT 1307 ON [id number] BY S. YARBROUGH    Enterobacter cloacae complex NOT DETECTED NOT DETECTED Final   Escherichia coli NOT DETECTED NOT DETECTED Final   Klebsiella oxytoca NOT DETECTED NOT DETECTED Final   Klebsiella pneumoniae NOT DETECTED NOT DETECTED Final   Proteus species NOT DETECTED NOT DETECTED Final   Serratia marcescens NOT DETECTED NOT DETECTED Final   Carbapenem resistance NOT DETECTED NOT DETECTED Final   Haemophilus influenzae NOT DETECTED NOT DETECTED Final   Neisseria meningitidis NOT DETECTED NOT DETECTED Final   Pseudomonas aeruginosa NOT DETECTED NOT DETECTED Final   Candida albicans NOT DETECTED NOT DETECTED Final   Candida glabrata NOT DETECTED NOT DETECTED Final   Candida krusei NOT DETECTED NOT DETECTED Final   Candida parapsilosis NOT DETECTED NOT DETECTED Final   Candida tropicalis NOT DETECTED NOT DETECTED Final    Comment: Performed at [facility]  MRSA PCR Screening     Status: None   Collection Time: 04/02/16  2:08 PM  Result Value Ref Range Status   MRSA by PCR NEGATIVE NEGATIVE Final    Comment:        The GeneXpert MRSA Assay (FDA approved for NASAL specimens only), is one component of a comprehensive MRSA colonization surveillance program. It is not intended to diagnose MRSA infection nor to guide or monitor treatment for MRSA infections.   Culture, Urine     Status: Abnormal   Collection Time: 04/02/16  6:46 PM  Result Value Ref Range Status   Specimen Description URINE, SUPRAPUBIC aspirated urine  Final   Special Requests NONE  Final   Culture >=100,000 COLONIES/mL CITROBACTER KOSERI (A)  Final   Report Status 04/05/2016 FINAL  Final   Organism ID, Bacteria CITROBACTER KOSERI (A)  Final      Susceptibility   Citrobacter koseri - MIC*    CEFAZOLIN <=4 SENSITIVE Sensitive     CEFTRIAXONE <=1 SENSITIVE Sensitive      CIPROFLOXACIN <=0.25 SENSITIVE Sensitive     GENTAMICIN <=1 SENSITIVE Sensitive     IMIPENEM <=0.25 SENSITIVE Sensitive     NITROFURANTOIN <=16 SENSITIVE Sensitive     TRIMETH/SULFA <=20 SENSITIVE Sensitive     PIP/TAZO <=4 SENSITIVE Sensitive     * >=100,000 COLONIES/mL CITROBACTER KOSERI    Studies/Results: No results found.    Jodean Valade I Belisa Eichholz 04/05/2016, 12:11 PM

## 2016-04-05 NOTE — Progress Notes (Signed)
PROGRESS NOTE  Audrey Mullen [id number] DOB: 1941-01-19 DOA: 04/02/2016 PCP: Elyn Peers, MD  HPI/Recap of past 24 hours:   S/p nephrostomy tube placement on 5/16, last fever 100.7 at 7pm on 5/18 , cr and wbc better,  left flank pain resolving, no n/v. Remain in afib, heart rate controlled off cardizem drip, denies chest pain, no sob, no room air Cardiology started heparin drip and coumadin  Assessment/Plan: Principal Problem:   Sepsis ([place]) Active Problems:   AKI (acute kidney injury) ([place])   Pyelonephritis, acute   Atrial fibrillation with RVR ([place])   Hydronephrosis   Ureteral stone with hydronephrosis  Sepsis:  Present on admission, evidenced by fever, a white count of 20,500, creatinine of 2.62, troponin of 0.25, lactic acid of 2.3, heart rates in the 160s. Source of infection likely from pyelonephritis in setting of obstructive uropathy. And bacteremia  on the sepsis protocol follow repeat lactic acid level, obtain blood cultures 2 sites, urine cultures, start empiric IV antibiotic therapy with ceftriaxone 2 g IV every 24 hours. admitted to the stepdown unit initially, better, transfer to med tele on 5/18   Acute pyelonephritis/obstructive uropathy/AKI. She presents septic, having left leg pain and nausea and vomiting workup in the emergency room included a CT scan that showed an 8 mm calcified obstructive calculus in the left UPJ. This was associated with moderate left hydronephrosis. As mentioned above initiating empiric IV antibiotic therapy with ceftriaxone 2 g IV every 24 hours. Dr. Gaynelle Arabian of urology who advised to  have interventional radiology place a percutaneous drainage catheter which was done on 5/16 Cr /wbc better  Bacteremia: on abx, repeat blood culture  Afib/rvr: new diagnose.  tsh 0.3, keep k>4, mag >2,  CHADSvasc score 4 with age, female, h/o diet controlled dm2. Was not previously on meds or anticoagulation, cardizem drip started from  admission,  meds and anticoagulation per cardiology,   Troponin elevation: in the setting of sepsis/aki/afib, denies chest pain, bp stable. ekg afib/non specific st/t flattening. Echo pending, cardiology consulted.  H/o HTN;  Home meds aldactazide held  H/o hld: continue crestor  H/o diet controlled dm2, a1c 7.5  Oral thrush: topical nystatin  Code Status: full  Family Communication: patient   Disposition Plan: out of  Stepdown to med tele on 5/18, home once INR therapeutic, start physical therapy   Consultants:  Urology for nephrolithiasis and obstructive uropathy  IR for perc drain  Cardiology for afib (new diagnose), troponin elevation  Procedures:  Left PCN insertion on 5/16 by IR  Antibiotics:  Rocephin from admission    Objective: BP 136/73 mmHg  Pulse 88  Temp(Src) 98.2 F (36.8 C) (Oral)  Resp 19  Ht 5\' 4"  (1.626 m)  Wt 73.8 kg (162 lb 11.2 oz)  BMI 27.91 kg/m2  SpO2 99%  Intake/Output Summary (Last 24 hours) at 04/05/16 2155 Last data filed at 04/05/16 1759  Gross per 24 hour  Intake   2240 ml  Output   1800 ml  Net    440 ml   Filed Weights   04/02/16 0757 04/02/16 1400  Weight: 79.379 kg (175 lb) 73.8 kg (162 lb 11.2 oz)    Exam:   General:  NAD, weak   Cardiovascular: IRRR  Respiratory: CTABL  Abdomen: Soft/ND/NT, positive BS, left sided nephrostomy tube in place with clear urine.  Musculoskeletal: No Edema  Neuro: aaox3   Data Reviewed: Basic Metabolic Panel:  Recent Labs Lab 04/02/16 0830 04/03/16 0318 04/03/16 [id number] 04/04/16 0327  04/05/16 0457  NA 136 139  --  137 136  K 4.1 3.5  --  3.6 3.6  CL 100* 108  --  110 108  CO2 24 21*  --  21* 21*  GLUCOSE 200* 203*  --  160* 188*  BUN 45* 29*  --  21* 11  CREATININE 2.62* 2.02*  --  1.47* 1.35*  CALCIUM 9.1 8.1*  --  8.3* 8.3*  MG  --   --  1.8  --  1.5*   Liver Function Tests:  Recent Labs Lab 04/02/16 0830  AST 36  ALT 19  ALKPHOS 77  BILITOT 1.0  PROT  7.5  ALBUMIN 3.9    Recent Labs Lab 04/02/16 0830  LIPASE 19   No results for input(s): AMMONIA in the last 168 hours. CBC:  Recent Labs Lab 04/02/16 0830 04/03/16 0318 04/04/16 0327 04/05/16 0457  WBC 20.5* 14.7* 9.4 8.0  NEUTROABS 19.1*  --   --   --   HGB 12.6 10.2* 9.7* 9.9*  HCT 38.3 32.4* 30.5* 30.5*  MCV 84.2 85.7 84.0 83.3  PLT 144* 136* 151 180   Cardiac Enzymes:    Recent Labs Lab 04/02/16 1525 04/02/16 2050 04/03/16 0318 04/03/16 0921  TROPONINI 0.39* 0.84* 1.03* 0.69*   BNP (last 3 results) No results for input(s): BNP in the last 8760 hours.  ProBNP (last 3 results) No results for input(s): PROBNP in the last 8760 hours.  CBG:  Recent Labs Lab 04/04/16 1655 04/04/16 2126 04/05/16 0741 04/05/16 1152 04/05/16 1650  GLUCAP 121* 166* 155* 132* 125*    Recent Results (from the past 240 hour(s))  Urine culture     Status: Abnormal   Collection Time: 04/02/16  8:24 AM  Result Value Ref Range Status   Specimen Description URINE, CLEAN CATCH  Final   Special Requests NONE  Final   Culture MULTIPLE SPECIES PRESENT, SUGGEST RECOLLECTION (A)  Final   Report Status 04/03/2016 FINAL  Final  Culture, blood (routine x 2)     Status: None (Preliminary result)   Collection Time: 04/02/16 11:38 AM  Result Value Ref Range Status   Specimen Description BLOOD RIGHT ANTECUBITAL  Final   Special Requests BOTTLES DRAWN AEROBIC AND ANAEROBIC 5ML  Final   Culture  Setup Time   Final    GRAM NEGATIVE RODS ANAEROBIC BOTTLE ONLY CRITICAL RESULT CALLED TO, READ BACK BY AND VERIFIED WITH: Chales Abrahams, [place] AT [place] ON [id number] BY Rhea Bleacher    Culture   Final    Lonell Grandchild NEGATIVE RODS IDENTIFICATION TO FOLLOW Performed at [facility]    Report Status PENDING  Incomplete  Culture, blood (routine x 2)     Status: Abnormal   Collection Time: 04/02/16 11:48 AM  Result Value Ref Range Status   Specimen Description BLOOD RIGHT HAND  Final   Special Requests  BOTTLES DRAWN AEROBIC ONLY 5ML  Final   Culture  Setup Time   Final    GRAM NEGATIVE RODS AEROBIC BOTTLE ONLY Organism ID to follow CRITICAL RESULT CALLED TO, READ BACK BY AND VERIFIED WITH: Sherilyn Cooter D AT [place] ON [id number] BY Rhea Bleacher Performed at [place] (A)  Final   Report Status 04/05/2016 FINAL  Final   Organism ID, Bacteria CITROBACTER KOSERI  Final      Susceptibility   Citrobacter koseri - MIC*    CEFAZOLIN <=4 SENSITIVE Sensitive     CEFEPIME <=  1 SENSITIVE Sensitive     CEFTAZIDIME <=1 SENSITIVE Sensitive     CEFTRIAXONE <=1 SENSITIVE Sensitive     CIPROFLOXACIN <=0.25 SENSITIVE Sensitive     GENTAMICIN <=1 SENSITIVE Sensitive     IMIPENEM <=0.25 SENSITIVE Sensitive     TRIMETH/SULFA <=20 SENSITIVE Sensitive     PIP/TAZO <=4 SENSITIVE Sensitive     * CITROBACTER KOSERI  Blood Culture ID Panel (Reflexed)     Status: Abnormal   Collection Time: 04/02/16 11:48 AM  Result Value Ref Range Status   Enterococcus species NOT DETECTED NOT DETECTED Final   Vancomycin resistance NOT DETECTED NOT DETECTED Final   Listeria monocytogenes NOT DETECTED NOT DETECTED Final   Staphylococcus species NOT DETECTED NOT DETECTED Final   Staphylococcus aureus NOT DETECTED NOT DETECTED Final   Methicillin resistance NOT DETECTED NOT DETECTED Final   Streptococcus species NOT DETECTED NOT DETECTED Final   Streptococcus agalactiae NOT DETECTED NOT DETECTED Final   Streptococcus pneumoniae NOT DETECTED NOT DETECTED Final   Streptococcus pyogenes NOT DETECTED NOT DETECTED Final   Acinetobacter baumannii NOT DETECTED NOT DETECTED Final   Enterobacteriaceae species DETECTED (A) NOT DETECTED Final    Comment: CRITICAL RESULT CALLED TO, READ BACK BY AND VERIFIED WITH: N. GLOGOVAC, PHARM D AT 1307 ON [id number] BY S. YARBROUGH    Enterobacter cloacae complex NOT DETECTED NOT DETECTED Final   Escherichia coli NOT DETECTED NOT DETECTED Final   Klebsiella  oxytoca NOT DETECTED NOT DETECTED Final   Klebsiella pneumoniae NOT DETECTED NOT DETECTED Final   Proteus species NOT DETECTED NOT DETECTED Final   Serratia marcescens NOT DETECTED NOT DETECTED Final   Carbapenem resistance NOT DETECTED NOT DETECTED Final   Haemophilus influenzae NOT DETECTED NOT DETECTED Final   Neisseria meningitidis NOT DETECTED NOT DETECTED Final   Pseudomonas aeruginosa NOT DETECTED NOT DETECTED Final   Candida albicans NOT DETECTED NOT DETECTED Final   Candida glabrata NOT DETECTED NOT DETECTED Final   Candida krusei NOT DETECTED NOT DETECTED Final   Candida parapsilosis NOT DETECTED NOT DETECTED Final   Candida tropicalis NOT DETECTED NOT DETECTED Final    Comment: Performed at [facility]  MRSA PCR Screening     Status: None   Collection Time: 04/02/16  2:08 PM  Result Value Ref Range Status   MRSA by PCR NEGATIVE NEGATIVE Final    Comment:        The GeneXpert MRSA Assay (FDA approved for NASAL specimens only), is one component of a comprehensive MRSA colonization surveillance program. It is not intended to diagnose MRSA infection nor to guide or monitor treatment for MRSA infections.   Culture, Urine     Status: Abnormal   Collection Time: 04/02/16  6:46 PM  Result Value Ref Range Status   Specimen Description URINE, SUPRAPUBIC aspirated urine  Final   Special Requests NONE  Final   Culture >=100,000 COLONIES/mL CITROBACTER KOSERI (A)  Final   Report Status 04/05/2016 FINAL  Final   Organism ID, Bacteria CITROBACTER KOSERI (A)  Final      Susceptibility   Citrobacter koseri - MIC*    CEFAZOLIN <=4 SENSITIVE Sensitive     CEFTRIAXONE <=1 SENSITIVE Sensitive     CIPROFLOXACIN <=0.25 SENSITIVE Sensitive     GENTAMICIN <=1 SENSITIVE Sensitive     IMIPENEM <=0.25 SENSITIVE Sensitive     NITROFURANTOIN <=16 SENSITIVE Sensitive     TRIMETH/SULFA <=20 SENSITIVE Sensitive     PIP/TAZO <=4 SENSITIVE Sensitive     * >=  100,000 COLONIES/mL  CITROBACTER KOSERI  Culture, blood (routine x 2)     Status: None (Preliminary result)   Collection Time: 04/04/16 10:15 AM  Result Value Ref Range Status   Specimen Description BLOOD RIGHT ARM  Final   Special Requests BOTTLES DRAWN AEROBIC AND ANAEROBIC 10CC  Final   Culture   Final    NO GROWTH 1 DAY Performed at [facility]    Report Status PENDING  Incomplete  Culture, blood (routine x 2)     Status: None (Preliminary result)   Collection Time: 04/04/16 10:15 AM  Result Value Ref Range Status   Specimen Description BLOOD LEFT HAND  Final   Special Requests BOTTLES DRAWN AEROBIC ONLY [place]  Final   Culture   Final    NO GROWTH 1 DAY Performed at [facility]    Report Status PENDING  Incomplete     Studies: No results found.  Scheduled Meds: . cefTRIAXone (ROCEPHIN)  IV  2 g Intravenous Q24H  . diltiazem  240 mg Oral Daily  . insulin aspart  0-15 Units Subcutaneous TID WC  . nystatin  5 mL Oral QID  . rosuvastatin  20 mg Oral Daily  . sodium chloride flush  3 mL Intravenous Q12H  . Warfarin - Pharmacist Dosing Inpatient   Does not apply q1800    Continuous Infusions: . sodium chloride 100 mL/hr at 04/05/16 1537  . heparin 1,000 Units/hr (04/05/16 1937)     Time spent: 82mins  Russia Scheiderer MD, PhD  [facility] Pager [phone]. If 7PM-7AM, please contact night-coverage at [email or web address], password [facility] 04/05/2016, 9:55 PM  LOS: 3 days

## 2016-04-05 NOTE — Discharge Instructions (Signed)

## 2016-04-05 NOTE — Progress Notes (Addendum)
IV site found to be leaking. Pt receiving Heparin drip. Unsure of how long site was leaking. Heparin paused for about 10-15 mins while a new site was being obtained. Heparin drip resumed at 1539 via new IV site. Pharmacy and bedside RN made aware of leaking, in case heparin level is low with next lab draw. Had to hold pressure at old IV site for over 20 minutes, with arm above heart. Had use ice at site for a few minutes to finally get bleeding to stop.   Audrey Mullen [facility] 04/05/2016 3:41 PM

## 2016-04-05 NOTE — Progress Notes (Signed)
ANTICOAGULATION CONSULT NOTE - Initial Consult  Pharmacy Consult for warfarin/heparin Indication: atrial fibrillation  No Known Allergies  Patient Measurements: Height: 5\' 4"  (162.6 cm) Weight: 162 lb 11.2 oz (73.8 kg) IBW/kg (Calculated) : 54.7 Heparin Dosing Weight:   Vital Signs: Temp: 99.7 F (37.6 C) (05/19 0422) Temp Source: Oral (05/19 0422) BP: 155/89 mmHg (05/19 0422) Pulse Rate: 9 (05/19 0422)  Labs:  Recent Labs  04/02/16 1239  04/02/16 1525 04/02/16 2050  04/03/16 0318 04/03/16 0921 04/04/16 0327 04/05/16 0457  HGB  --   --   --   --   < > 10.2*  --  9.7* 9.9*  HCT  --   --   --   --   --  32.4*  --  30.5* 30.5*  PLT  --   --   --   --   --  136*  --  151 180  APTT 27  --  28  --   --   --   --   --   --   LABPROT 14.8  --  14.7  --   --   --   --   --   --   INR 1.14  --  1.13  --   --   --   --   --   --   CREATININE  --   --   --   --   --  2.02*  --  1.47* 1.35*  TROPONINI  --   < > 0.39* 0.84*  --  1.03* 0.69*  --   --   < > = values in this interval not displayed.  Estimated Creatinine Clearance: 34.3 mL/min (by C-G formula based on Cr of 1.35).   Medical History: Past Medical History  Diagnosis Date  . Diabetes mellitus without complication ([place])   . Arthritis   . Hyperlipidemia     Medications:  Prescriptions prior to admission  Medication Sig Dispense Refill Last Dose  . rosuvastatin (CRESTOR) 20 MG tablet Take 20 mg by mouth daily.   03/30/2016  . spironolactone-hydrochlorothiazide (ALDACTAZIDE) 25-25 MG tablet Take 1 tablet by mouth daily.  3 03/30/2016  . fluticasone (FLONASE) 50 MCG/ACT nasal spray Place 2 sprays into the nose daily. (Patient not taking: Reported on 04/02/2016) 16 g 6 Not Taking at Unknown time    Assessment: Pharmacy is consulted to dose heparin and warfarin in 75 yo female with newly diagnosed with a.fib with RVR. Pt was noted not to be on any anticoagulation for other indications PTA. Chads2Vasc score is 5.    Baseline labs:  INR 1.13, PT 14.7 ( 5/16),   Hgb 9.9, plt 180 ( 5/19)  LFT, albumin WNL  SCr elevated on admission, trending down  04/05/2016  Heparin to start with no bolus.   Of note pt received heparin 5000 units SQ this AM. Therefore will start heparin infusion at slightly lower rate.     Goal of Therapy:  INR 2-3  Anti-Xa level of 0.3-0.7 Monitor platelets by anticoagulation protocol: Yes   Plan:   Start heparin infusion at 950 units/hr   Warfarin 5 mg PO x1   Daily PT/INR  Daily CBC   Monitor for signs and symptoms of bleeding  Royetta Asal, PharmD, BCPS Pager [phone] 04/05/2016 8:51 AM

## 2016-04-05 NOTE — Progress Notes (Signed)
ANTICOAGULATION CONSULT NOTE - Initial Consult  Pharmacy Consult for warfarin/heparin Indication: atrial fibrillation  No Known Allergies  Patient Measurements: Height: 5\' 4"  (162.6 cm) Weight: 162 lb 11.2 oz (73.8 kg) IBW/kg (Calculated) : 54.7 Heparin Dosing Weight:   Vital Signs: Temp: 98.2 F (36.8 C) (05/19 1450) Temp Source: Oral (05/19 1450) BP: 136/73 mmHg (05/19 1450) Pulse Rate: 88 (05/19 1450)  Labs:  Recent Labs  04/02/16 2050  04/03/16 0318 04/03/16 0921 04/04/16 0327 04/05/16 0457 04/05/16 1822  HGB  --   < > 10.2*  --  9.7* 9.9*  --   HCT  --   --  32.4*  --  30.5* 30.5*  --   PLT  --   --  136*  --  151 180  --   HEPARINUNFRC  --   --   --   --   --   --  0.19*  CREATININE  --   --  2.02*  --  1.47* 1.35*  --   TROPONINI 0.84*  --  1.03* 0.69*  --   --   --   < > = values in this interval not displayed.  Estimated Creatinine Clearance: 34.3 mL/min (by C-G formula based on Cr of 1.35).  Medical History: Past Medical History  Diagnosis Date  . Diabetes mellitus without complication ([place])   . Arthritis   . Hyperlipidemia    Medications:  Prescriptions prior to admission  Medication Sig Dispense Refill Last Dose  . rosuvastatin (CRESTOR) 20 MG tablet Take 20 mg by mouth daily.   03/30/2016  . spironolactone-hydrochlorothiazide (ALDACTAZIDE) 25-25 MG tablet Take 1 tablet by mouth daily.  3 03/30/2016  . fluticasone (FLONASE) 50 MCG/ACT nasal spray Place 2 sprays into the nose daily. (Patient not taking: Reported on 04/02/2016) 16 g 6 Not Taking at Unknown time   Assessment: Pharmacy is consulted to dose heparin and warfarin in 75 yo female with newly diagnosed with a.fib with RVR. Pt was noted not to be on any anticoagulation for other indications PTA. Chads2Vasc score is 5.   Baseline labs:  INR 1.13, PT 14.7 ( 5/16),   Hgb 9.9, plt 180 ( 5/19)  LFT, albumin WNL  SCr elevated on admission, trending down  04/05/2016  Heparin to start with  no bolus.   Of note pt received heparin 5000 units SQ this AM. Therefore will start heparin infusion at ~ lower rate 950 units/hr  IV site leaking, noticed ~ 15:30, infusion paused 10-15 min for new line start.  Required 20 minutes pressure & arm above heart to stop bleed at old IV site, Plt 180  1st Heparin level low, likely due to IV leaking  Goal of Therapy:  INR 2-3  Anti-Xa level of 0.3-0.7 Monitor platelets by anticoagulation protocol: Yes   Plan:   Increase Heparin infusion to 1000 units/hr  2nd Heparin level in 8 hr  Warfarin 5 mg PO x1   Daily PT/INR, Daily CBC   Monitor for signs and symptoms of bleeding  Minda Ditto PharmD Pager [phone] 04/05/2016, 7:21 PM

## 2016-04-06 LAB — CBC
HCT: 31.6 % — ABNORMAL LOW (ref 36.0–46.0)
HEMOGLOBIN: 10.4 g/dL — AB (ref 12.0–15.0)
MCH: 27.8 pg (ref 26.0–34.0)
MCHC: 32.9 g/dL (ref 30.0–36.0)
MCV: 84.5 fL (ref 78.0–100.0)
PLATELETS: 234 10*3/uL (ref 150–400)
RBC: 3.74 MIL/uL — ABNORMAL LOW (ref 3.87–5.11)
RDW: 15 % (ref 11.5–15.5)
WBC: 8.7 10*3/uL (ref 4.0–10.5)

## 2016-04-06 LAB — BASIC METABOLIC PANEL
Anion gap: 7 (ref 5–15)
BUN: 12 mg/dL (ref 6–20)
CALCIUM: 8.4 mg/dL — AB (ref 8.9–10.3)
CO2: 22 mmol/L (ref 22–32)
CREATININE: 1.14 mg/dL — AB (ref 0.44–1.00)
Chloride: 109 mmol/L (ref 101–111)
GFR calc Af Amer: 52 mL/min — ABNORMAL LOW (ref >60)
GFR, EST NON AFRICAN AMERICAN: 45 mL/min — AB (ref >60)
Glucose, Bld: 127 mg/dL — ABNORMAL HIGH (ref 65–99)
POTASSIUM: 3.5 mmol/L (ref 3.5–5.1)
SODIUM: 138 mmol/L (ref 135–145)

## 2016-04-06 LAB — PROTIME-INR
INR: 1.06 (ref 0.00–1.49)
PROTHROMBIN TIME: 13.6 s (ref 11.6–15.2)

## 2016-04-06 LAB — CULTURE, BLOOD (ROUTINE X 2)

## 2016-04-06 LAB — HEPARIN LEVEL (UNFRACTIONATED)
HEPARIN UNFRACTIONATED: 0.32 [IU]/mL (ref 0.30–0.70)
Heparin Unfractionated: 0.23 IU/mL — ABNORMAL LOW (ref 0.30–0.70)
Heparin Unfractionated: 0.32 IU/mL (ref 0.30–0.70)

## 2016-04-06 LAB — GLUCOSE, CAPILLARY
Glucose-Capillary: 125 mg/dL — ABNORMAL HIGH (ref 65–99)
Glucose-Capillary: 119 mg/dL — ABNORMAL HIGH (ref 65–99)
Glucose-Capillary: 140 mg/dL — ABNORMAL HIGH (ref 65–99)
Glucose-Capillary: 150 mg/dL — ABNORMAL HIGH (ref 65–99)

## 2016-04-06 LAB — MAGNESIUM: MAGNESIUM: 1.9 mg/dL (ref 1.7–2.4)

## 2016-04-06 MED ORDER — WARFARIN SODIUM 5 MG PO TABS
5.0000 mg | ORAL_TABLET | Freq: Once | ORAL | Status: AC
  Administered 2016-04-06: 5 mg via ORAL
  Filled 2016-04-06: qty 1

## 2016-04-06 MED ORDER — POTASSIUM CHLORIDE CRYS ER 20 MEQ PO TBCR
40.0000 meq | EXTENDED_RELEASE_TABLET | Freq: Once | ORAL | Status: AC
  Administered 2016-04-06: 40 meq via ORAL
  Filled 2016-04-06: qty 2

## 2016-04-06 NOTE — Progress Notes (Signed)
Patient ID: Audrey Mullen, female   DOB: 1941-03-11, 75 y.o.   MRN: [id number]   Patient Name: Audrey Mullen Date of Encounter: 04/06/2016     Principal Problem:   Sepsis [facility]) Active Problems:   AKI (acute kidney injury) ([place])   Pyelonephritis, acute   Atrial fibrillation with RVR ([place])   Hydronephrosis   Ureteral stone with hydronephrosis   Bacteremia    SUBJECTIVE  No complaints. Wants to know when she can have stones out.  CURRENT MEDS . cefTRIAXone (ROCEPHIN)  IV  2 g Intravenous Q24H  . diltiazem  240 mg Oral Daily  . insulin aspart  0-15 Units Subcutaneous TID WC  . nystatin  5 mL Oral QID  . rosuvastatin  20 mg Oral Daily  . sodium chloride flush  3 mL Intravenous Q12H  . warfarin  5 mg Oral ONCE-1800  . Warfarin - Pharmacist Dosing Inpatient   Does not apply q1800    OBJECTIVE  Filed Vitals:   04/05/16 0422 04/05/16 1450 04/05/16 2202 04/06/16 0455  BP: 155/89 136/73 140/99 137/74  Pulse: 9 88 103 93  Temp: 99.7 F (37.6 C) 98.2 F (36.8 C) 98.3 F (36.8 C) 98.3 F (36.8 C)  TempSrc: Oral Oral Oral Oral  Resp: 18 19 20 20   Height:      Weight:      SpO2: 99% 99% 98% 96%    Intake/Output Summary (Last 24 hours) at 04/06/16 0851 Last data filed at 04/06/16 0635  Gross per 24 hour  Intake   3305 ml  Output   2226 ml  Net   1079 ml   Filed Weights   04/02/16 0757 04/02/16 1400  Weight: 79.379 kg (175 lb) 73.8 kg (162 lb 11.2 oz)    PHYSICAL EXAM  General: Pleasant, NAD. Neuro: Alert and oriented X 3. Moves all extremities spontaneously. Psych: Normal affect. HEENT:  Normal  Neck: Supple without bruits or JVD. Lungs:  Resp regular and unlabored, CTA. Heart: irreg irreg no s3, s4, or murmurs. Abdomen: Soft, non-tender, non-distended, BS + x 4. S/p nephrostomy tube  Extremities: No clubbing, cyanosis or edema. DP/PT/Radials 2+ and equal bilaterally.  Accessory Clinical Findings  CBC  Recent Labs  04/05/16 0457  04/06/16 0421  WBC 8.0 8.7  HGB 9.9* 10.4*  HCT 30.5* 31.6*  MCV 83.3 84.5  PLT 180 [id number]   Basic Metabolic Panel  Recent Labs  04/05/16 0457 04/06/16 0421  NA 136 138  K 3.6 3.5  CL 108 109  CO2 21* 22  GLUCOSE 188* 127*  BUN 11 12  CREATININE 1.35* 1.14*  CALCIUM 8.3* 8.4*  MG 1.5* 1.9   Liver Function Tests No results for input(s): AST, ALT, ALKPHOS, BILITOT, PROT, ALBUMIN in the last 72 hours. No results for input(s): LIPASE, AMYLASE in the last 72 hours. Cardiac Enzymes  Recent Labs  04/03/16 0921  TROPONINI 0.69*   BNP Invalid input(s): POCBNP D-Dimer No results for input(s): DDIMER in the last 72 hours. Hemoglobin A1C  Recent Labs  04/03/16 0922  HGBA1C 7.5*   Fasting Lipid Panel  Recent Labs  04/04/16 0327  CHOL 178  HDL 24*  LDLCALC 107*  TRIG 237*  CHOLHDL 7.4   Thyroid Function Tests No results for input(s): TSH, T4TOTAL, T3FREE, THYROIDAB in the last 72 hours.  Invalid input(s): FREET3  TELE  afib with rate control HR low 100s  Radiology/Studies  Ct Abdomen Pelvis Wo Contrast  04/02/2016  CLINICAL DATA:  Left lower  quadrant pain, epigastric pain and nausea starting Sunday EXAM: CT ABDOMEN AND PELVIS WITHOUT CONTRAST TECHNIQUE: Multidetector CT imaging of the abdomen and pelvis was performed following the standard protocol without IV contrast. COMPARISON:  02/03/2007 FINDINGS: Lower chest: Lung bases shows no infiltrate or pulmonary edema. Mild bronchitic changes are noted bilateral lower lobe. Hepatobiliary: The unenhanced liver shows no biliary ductal dilatation. No calcified gallstones are noted within gallbladder. No CBD dilatation. Pancreas: The unenhanced pancreas is unremarkable. Spleen: Unenhanced spleen is unremarkable. Adrenals/Urinary Tract: Bilateral adrenal adenomas are stable. On the right side measures 2.5 cm. Left adrenal adenoma measures 1.6 cm. There is significant left perinephric stranding. Moderate left hydronephrosis.  Stable cortical scarring and small calculi in upper pole of the left kidney. Axial image 36 there is 8 mm calcified obstructive calculus in left UPJ. Nonobstructive calculus in midpole of the left kidney posterior aspect measures 4 mm. No right nephrolithiasis. No right hydronephrosis or hydroureter. Bilateral distal ureter is small caliber. There is a probable cyst in lower pole posterior aspect of the right kidney measures 1.8 cm. No calcified calculi are noted within urinary bladder. Stomach/Bowel: Small hiatal hernia. No gastric outlet obstruction. No small bowel obstruction. Oral contrast material was given to the patient. Terminal ileum is unremarkable. No pericecal inflammation. Normal appendix is noted in axial image 52. No distal colonic obstruction. Few colonic diverticula are noted descending colon and proximal sigmoid colon. No evidence of colitis or diverticulitis. Vascular/Lymphatic: Atherosclerotic calcifications of abdominal aorta and iliac arteries. No aortic aneurysm. No retroperitoneal or mesenteric adenopathy. Reproductive: Unenhanced uterus is atrophic.  No adnexal mass. Other: No ascites or free air. There is a tiny umbilical hernia containing fat without evidence of acute complication. Small right inguinal canal hernia containing fat measures 2 cm without evidence of acute complication. Musculoskeletal: No destructive bony lesions are noted. Mild levoscoliosis lumbar spine. Sagittal images of the spine shows significant degenerative changes lumbar spine and lower thoracic spine. Multilevel vacuum disc phenomenon anterior and posterior spurring noted lower thoracic and lumbar spine. IMPRESSION: 1. There is moderate left hydronephrosis. Mild left perinephric stranding. 2. Axial image 36 there is 8 mm calcified obstructive calculus in left UPJ. Nonobstructive calculus in mid to lower pole of the left kidney posterior aspect measures 4 mm. Again noted significant scarring and small cortical  calcifications in upper pole of the left kidney. 3. No right nephrolithiasis. Probable cyst in lower pole of the right kidney measures 1.9 cm. Bilateral distal ureter is unremarkable. No right hydronephrosis or hydroureter. 4. Normal appendix.  No pericecal inflammation. 5. Extensive degenerative changes thoracolumbar spine. 6. No calcified calculi are noted within urinary bladder. 7. Atrophic uterus. 8. Stable bilateral adrenal gland adenomas. Electronically Signed   By: Lahoma Crocker M.D.   On: 04/02/2016 11:02   Dg Chest 2 View  04/02/2016  CLINICAL DATA:  Cough, congestion, shortness of breath for 3 days EXAM: CHEST  2 VIEW COMPARISON:  01/31/2005 FINDINGS: Cardiomediastinal silhouette is stable. No infiltrate or pleural effusion. No pulmonary edema. Mild perihilar bronchitic changes. Mild degenerative changes thoracic spine. IMPRESSION: No infiltrate or pulmonary edema. Mild perihilar bronchitic changes. Degenerative changes thoracic spine. Electronically Signed   By: Lahoma Crocker M.D.   On: 04/02/2016 09:13    2D ECHO: 04/03/2016 LV EF: 55% - 60% Study Conclusions - Left ventricle: The cavity size was normal. There was mild focal  basal hypertrophy of the septum. Systolic function was normal.  The estimated ejection fraction was in the range of  55% to 60%.  Wall motion was normal; there were no regional wall motion  abnormalities. Left ventricular diastolic function parameters  were normal. - Aortic valve: Trileaflet; moderately thickened, moderately  calcified leaflets. - Left atrium: The atrium was mildly dilated.   ASSESSMENT AND PLAN  Audrey Mullen is a 75 y.o. female with a history of HTN, DMT2, HLD, nephrolithiasis and no prior cardiac history who presented to [facility] on 04/02/16 with flank pain and n/v. She was found to have sepsis 2/2 acute pyelonephritis and obstructive uropathy. She was also found to have afib with RVR and troponin elevation and cardiology was consulted.     Newly diagnosed Afib with RVR: Switched to PO Cardizem CD 180mg  yesterday. BP still elevated and HR in low 100s. Will increase to 240mg  daily.  -- TSH normal. Likely precipitated by sepsis. -- CHADSvasc score 5 (HTN, age, F sex, DM).On heparin and coumadin  Lab Results  Component Value Date   INR 1.06 04/06/2016   INR 1.13 04/02/2016   INR 1.14 04/02/2016    -- 2D ECHO 04/03/16 with mild focal basal hypertrophy of septum. EF 55-60%, no RWMA, mild LAE.  -- Would likely plan for rate control since she is asymptomatic. If she remains in afib we could consider DCCV after at least 5 weeks of theraputic INRs   Troponin elevation: 0.25--> 1.03--> 0.69. likely demand in the setting of sepsis, acute kidney injury and afib with rvr. She has had no chest pain. Likely outpatient myoview  AKI: creat up to 2.62--> 2.02--> 1.47--> 1.35-> 1.14  today which is probably her baseline.    HTN: Home meds spiro-HCTZ 25-25mg  held. Cardizem increased to 240 mg   HLD: continue crestor  Diet controlled T2DM: a1c 7.5  Sepsis: presented with fever, WBC 20,500, creatinine of 2.62, troponin of 0.25, lactic acid of 2.3, heart rates in the 160s. Source of infection likely from pyelonephritis in setting of obstructive uropathy. Improving.  Acute pyelonephritis/obstructive uropathy/AKI. CT scan that showed an 8 mm calcified obstructive calculus in the left UPJ with moderate left hydronephrosis. S/p percutaneous drainage catheter on 5/16.   Jenkins Rouge

## 2016-04-06 NOTE — Progress Notes (Signed)
    Referring Physician(s): Dr Florencia Reasons  Supervising Physician: Sandi Mariscal  Patient Status: In-pt  Chief Complaint:  L PCN placed 5/16 Hydronephrosis/nephrolithiasis  Subjective:  PCN draining well Doing well Better daily INR 1.6 today Awaiting therapeutic range to dc per chart  Allergies: Review of patient's allergies indicates no known allergies.  Medications: Prior to Admission medications   Medication Sig Start Date End Date Taking? Authorizing Provider  rosuvastatin (CRESTOR) 20 MG tablet Take 20 mg by mouth daily.   Yes Historical Provider, MD  spironolactone-hydrochlorothiazide (ALDACTAZIDE) 25-25 MG tablet Take 1 tablet by mouth daily. 03/23/16  Yes Historical Provider, MD  fluticasone (FLONASE) 50 MCG/ACT nasal spray Place 2 sprays into the nose daily. Patient not taking: Reported on 04/02/2016 08/13/13   Darlyne Russian, MD     Vital Signs: BP 137/74 mmHg  Pulse 93  Temp(Src) 98.3 F (36.8 C) (Oral)  Resp 20  Ht 5\' 4"  (1.626 m)  Wt 162 lb 11.2 oz (73.8 kg)  BMI 27.91 kg/m2  SpO2 96%  Physical Exam  Constitutional: She is oriented to person, place, and time.  Abdominal: Soft.  Neurological: She is alert and oriented to person, place, and time.  Skin: Skin is warm and dry.  Site of L PCN clean and dry NT No bleeding Output yellow 725 cc yesterday 350 cc today  Cr 1.14   Nursing note and vitals reviewed.   Imaging: No results found.  Labs:  CBC:  Recent Labs  04/03/16 0318 04/04/16 0327 04/05/16 0457 04/06/16 0421  WBC 14.7* 9.4 8.0 8.7  HGB 10.2* 9.7* 9.9* 10.4*  HCT 32.4* 30.5* 30.5* 31.6*  PLT 136* 151 180 234    COAGS:  Recent Labs  04/02/16 1239 04/02/16 1525 04/06/16 0421  INR 1.14 1.13 1.06  APTT 27 28  --     BMP:  Recent Labs  04/03/16 0318 04/04/16 0327 04/05/16 0457 04/06/16 0421  NA 139 137 136 138  K 3.5 3.6 3.6 3.5  CL 108 110 108 109  CO2 21* 21* 21* 22  GLUCOSE 203* 160* 188* 127*  BUN 29* 21*  11 12  CALCIUM 8.1* 8.3* 8.3* 8.4*  CREATININE 2.02* 1.47* 1.35* 1.14*  GFRNONAA 23* 33* 37* 45*  GFRAA 26* 39* 43* 52*    LIVER FUNCTION TESTS:  Recent Labs  04/02/16 0830  BILITOT 1.0  AST 36  ALT 19  ALKPHOS 77  PROT 7.5  ALBUMIN 3.9    Assessment and Plan:  L PCN Hydronephrosis/nephrolithiasis Cr 1.14 today INR 1.6----MD awaiting therapeutic level Will follow as needed--- plan for drain per Dr Gaynelle Arabian  Electronically Signed: Monia Sabal A 04/06/2016, 11:57 AM   I spent a total of 15 Minutes at the the patient's bedside AND on the patient's hospital floor or unit, greater than 50% of which was counseling/coordinating care for L PCN

## 2016-04-06 NOTE — Progress Notes (Signed)
ANTICOAGULATION CONSULT NOTE - Initial Consult  Pharmacy Consult for warfarin/heparin Indication: atrial fibrillation  No Known Allergies  Patient Measurements: Height: 5\' 4"  (162.6 cm) Weight: 162 lb 11.2 oz (73.8 kg) IBW/kg (Calculated) : 54.7 Heparin Dosing Weight:   Vital Signs: Temp: 98.3 F (36.8 C) (05/20 0455) Temp Source: Oral (05/20 0455) BP: 137/74 mmHg (05/20 0455) Pulse Rate: 93 (05/20 0455)  Labs:  Recent Labs  04/04/16 0327 04/05/16 0457 04/05/16 1822 04/06/16 0421 04/06/16 1140  HGB 9.7* 9.9*  --  10.4*  --   HCT 30.5* 30.5*  --  31.6*  --   PLT 151 180  --  234  --   LABPROT  --   --   --  13.6  --   INR  --   --   --  1.06  --   HEPARINUNFRC  --   --  0.19* 0.32 0.23*  CREATININE 1.47* 1.35*  --  1.14*  --     Estimated Creatinine Clearance: 40.6 mL/min (by C-G formula based on Cr of 1.14).  Medical History: Past Medical History  Diagnosis Date  . Diabetes mellitus without complication ([place])   . Arthritis   . Hyperlipidemia    Medications:  Prescriptions prior to admission  Medication Sig Dispense Refill Last Dose  . rosuvastatin (CRESTOR) 20 MG tablet Take 20 mg by mouth daily.   03/30/2016  . spironolactone-hydrochlorothiazide (ALDACTAZIDE) 25-25 MG tablet Take 1 tablet by mouth daily.  3 03/30/2016  . fluticasone (FLONASE) 50 MCG/ACT nasal spray Place 2 sprays into the nose daily. (Patient not taking: Reported on 04/02/2016) 16 g 6 Not Taking at Unknown time   Assessment: Pharmacy is consulted to dose heparin and warfarin in 75 yo female with newly diagnosed with a.fib with RVR. Pt was noted not to be on any anticoagulation for other indications PTA. Chads2Vasc score is 5.   Baseline labs:  INR 1.13, PT 14.7 ( 5/16),   Hgb 9.9, plt 180 ( 5/19)  LFT, albumin WNL  SCr elevated on admission, trending down  04/06/2016  Heparin to start with no bolus.   Hgb, low but stable, Plt 234  IV site leaking, noticed ~ 15:30, infusion paused  10-15 min for new line start.  Required 20 minutes pressure & arm above heart to stop bleed at old IV site, Plt 180  INR 1.06, subtherapeutic  Goal of Therapy:  INR 2-3  Anti-Xa level of 0.3-0.7 Monitor platelets by anticoagulation protocol: Yes   Plan:   Increase Heparin infusion to 1150 units/hr  Heparin level in 8 hr  Warfarin 5 mg PO x1   Daily PT/INR, Daily CBC   Monitor for signs and symptoms of bleeding  Royetta Asal, PharmD, BCPS Pager [phone] 04/06/2016 12:43 PM

## 2016-04-06 NOTE — Progress Notes (Signed)
PHARMACY - HEPARIN (brief note)  Patient on IV heparin for AFib (Day #2 warfarin overlap)  IV heparin infusing @ 1150 units/hr  Heparin level = 0.32 (Goal 0000000)  No complications of therapy noted  PLAN:  Continue IV heparin @ 1150 units/hr             F/U AM labs  Leone Haven, PharmD 04/06/16 @ 21:43

## 2016-04-06 NOTE — Progress Notes (Signed)
ANTICOAGULATION CONSULT NOTE -   Pharmacy Consult for warfarin/heparin Indication: atrial fibrillation  No Known Allergies  Patient Measurements: Height: 5\' 4"  (162.6 cm) Weight: 162 lb 11.2 oz (73.8 kg) IBW/kg (Calculated) : 54.7 Heparin Dosing Weight:   Vital Signs: Temp: 98.3 F (36.8 C) (05/20 0455) Temp Source: Oral (05/20 0455) BP: 137/74 mmHg (05/20 0455) Pulse Rate: 93 (05/20 0455)  Labs:  Recent Labs  04/03/16 0921  04/04/16 0327 04/05/16 0457 04/05/16 1822 04/06/16 0421  HGB  --   < > 9.7* 9.9*  --  10.4*  HCT  --   --  30.5* 30.5*  --  31.6*  PLT  --   --  151 180  --  234  LABPROT  --   --   --   --   --  13.6  INR  --   --   --   --   --  1.06  HEPARINUNFRC  --   --   --   --  0.19* 0.32  CREATININE  --   --  1.47* 1.35*  --  1.14*  TROPONINI 0.69*  --   --   --   --   --   < > = values in this interval not displayed.  Estimated Creatinine Clearance: 40.6 mL/min (by C-G formula based on Cr of 1.14).  Medical History: Past Medical History  Diagnosis Date  . Diabetes mellitus without complication ([place])   . Arthritis   . Hyperlipidemia    Medications:  Prescriptions prior to admission  Medication Sig Dispense Refill Last Dose  . rosuvastatin (CRESTOR) 20 MG tablet Take 20 mg by mouth daily.   03/30/2016  . spironolactone-hydrochlorothiazide (ALDACTAZIDE) 25-25 MG tablet Take 1 tablet by mouth daily.  3 03/30/2016  . fluticasone (FLONASE) 50 MCG/ACT nasal spray Place 2 sprays into the nose daily. (Patient not taking: Reported on 04/02/2016) 16 g 6 Not Taking at Unknown time   Assessment: Pharmacy is consulted to dose heparin and warfarin in 75 yo female with newly diagnosed with a.fib with RVR. Pt was noted not to be on any anticoagulation for other indications PTA. Chads2Vasc score is 5.   Baseline labs:  INR 1.13, PT 14.7 ( 5/16),   Hgb 9.9, plt 180 ( 5/19)  LFT, albumin WNL  SCr elevated on admission, trending down  04/06/2016  Heparin  infusing @ 1000 units/hr  Heparin level = 0.32 (therapeutic)  INR = 1.06 (Warfarin started 5/19)..   Goal of Therapy:  INR 2-3  Anti-Xa level of 0.3-0.7 Monitor platelets by anticoagulation protocol: Yes   Plan:   Continie Heparin infusion to 1000 units/hr  Repeat Heparin level in 8 hr to confirm therapeutic dose  Warfarin 5 mg PO x1   Daily PT/INR, Daily CBC   Monitor for signs and symptoms of bleeding  Leone Haven, PharmD Pager [phone] 04/06/2016, 5:51 AM

## 2016-04-06 NOTE — Evaluation (Signed)
Physical Therapy Evaluation Patient Details Name: PAIZLEE BELLINGHAUSEN MRN: [id number] DOB: 1941-11-14 Today's Date: 04/06/2016   History of Present Illness  75 y.o. female with medical history significant of diabetes mellitus, also arthritis, dyslipidemia, presented to the emergency department with complaints of nausea vomiting associate with left flank pain. Dx: pyelonephritis, sepsis, AKI, a fib, s/p nephrostomy tube 04/02/16.  Clinical Impression  Pt admitted with above diagnosis. Pt currently with functional limitations due to the deficits listed below (see PT Problem List). Pt ambulated 160' with RW, no loss of balance. Anticipate she can return to her prior level of independence with mobility.  Pt will benefit from skilled PT to increase their independence and safety with mobility to allow discharge to the venue listed below.       Follow Up Recommendations No PT follow up    Equipment Recommendations  Rolling walker with 5" wheels    Recommendations for Other Services       Precautions / Restrictions Precautions Precautions: Other (comment) Precaution Comments: L nephrostomy tube with drain, denies h/o falls Restrictions Weight Bearing Restrictions: No      Mobility  Bed Mobility Overal bed mobility: Modified Independent             General bed mobility comments: HOB up 35*  Transfers Overall transfer level: Independent                  Ambulation/Gait Ambulation/Gait assistance: Supervision Ambulation Distance (Feet): 160 Feet Assistive device: Rolling walker (2 wheeled) Gait Pattern/deviations: Step-through pattern;Decreased stride length;Trunk flexed   Gait velocity interpretation: at or above normal speed for age/gender General Gait Details: verbal cues for posture, steady with RW, no LOB  Stairs            Wheelchair Mobility    Modified Rankin (Stroke Patients Only)       Balance Overall balance assessment: Modified Independent                                           Pertinent Vitals/Pain Pain Assessment: No/denies pain    Home Living Family/patient expects to be discharged to:: Private residence Living Arrangements: Spouse/significant other Available Help at Discharge: Family;Available 24 hours/day Type of Home: House Home Access: Stairs to enter   [employer] of Steps: 2 Home Layout: Multi-level Home Equipment: None      Prior Function Level of Independence: Independent               Hand Dominance        Extremity/Trunk Assessment   Upper Extremity Assessment: Overall WFL for tasks assessed           Lower Extremity Assessment: Overall WFL for tasks assessed      Cervical / Trunk Assessment: Kyphotic (forward head)  Communication   Communication: No difficulties  Cognition Arousal/Alertness: Awake/alert Behavior During Therapy: WFL for tasks assessed/performed Overall Cognitive Status: Within Functional Limits for tasks assessed                      General Comments      Exercises        Assessment/Plan    PT Assessment Patient needs continued PT services  PT Diagnosis Generalized weakness   PT Problem List Decreased activity tolerance  PT Treatment Interventions Gait training;Stair training   PT Goals (Current goals can be found in the  Care Plan section) Acute Rehab PT Goals Patient Stated Goal: 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

## 2016-04-06 NOTE — Progress Notes (Signed)
PROGRESS NOTE  Audrey Mullen [id number] DOB: [birth year] DOA: 04/02/2016 PCP: Elyn Peers, MD  HPI/Recap of past 24 hours:   S/p nephrostomy tube placement on 5/16, last fever 100.7 at 7pm on 5/18 ,  cr and wbc better,  left flank pain resolving, no n/v. Remain in afib, heart rate controlled off cardizem drip, denies chest pain, no sob, no room air Cardiology started heparin drip and coumadin  Assessment/Plan: Principal Problem:   Sepsis ([place]) Active Problems:   AKI (acute kidney injury) ([place])   Pyelonephritis, acute   Atrial fibrillation with RVR ([place])   Hydronephrosis   Ureteral stone with hydronephrosis   Bacteremia  Sepsis:  Present on admission, evidenced by fever, a white count of 20,500, creatinine of 2.62, troponin of 0.25, lactic acid of 2.3, heart rates in the 160s. Source of infection likely from pyelonephritis in setting of obstructive uropathy. And bacteremia, on sepsis protocol  start empiric IV antibiotic therapy with ceftriaxone 2 g IV every 24 hours. Blood and urine culture +citrobacter sensitive to rocephin, repeat blood culture no growth admitted to the stepdown unit initially, better, transfer to med tele on 5/18 Improving, d/c ivf   Acute pyelonephritis/obstructive uropathy/AKI.  She presents septic, having left leg pain and nausea and vomiting workup in the emergency room included a CT scan that showed an 8 mm calcified obstructive calculus in the left UPJ. This was associated with moderate left hydronephrosis.   ceftriaxone 2 g IV every 24 hours.  Dr. Gaynelle Arabian of urology who advised to  have interventional radiology place a percutaneous drainage catheter which was done on 5/16 improving  Bacteremia: on IV abx, repeat blood culture, likely transition to oral abx soon  Afib/rvr: new diagnose.  tsh 0.3, keep k>4, mag >2,  CHADSvasc score 4 with age, female, h/o diet controlled dm2. Was not previously on meds or anticoagulation,  She was on  cardizem drip started from admission, now on oral cardizem, cardiology started heparin drip and coumadin meds and anticoagulation per cardiology,   Troponin elevation: in the setting of sepsis/aki/afib, denies chest pain, bp stable. ekg afib/non specific st/t flattening. Echo pending, cardiology consulted.  H/o HTN;  Home meds aldactazide held, now on cardizem  H/o hld: continue crestor  H/o diet controlled dm2, a1c 7.5, on ssi here  Oral thrush: topical nystatin  Hypokalemia/hyponagenesemia: replace k/mag, keep mag>2, k>4.  Code Status: full  Family Communication: patient   Disposition Plan: out of  Stepdown to med tele on 5/18, home once INR therapeutic, start physical therapy   Consultants:  Urology for nephrolithiasis and obstructive uropathy  IR for perc drain  Cardiology for afib (new diagnose), troponin elevation  Procedures:  Left PCN insertion on 5/16 by IR  Antibiotics:  Rocephin from admission    Objective: BP 137/74 mmHg  Pulse 93  Temp(Src) 98.3 F (36.8 C) (Oral)  Resp 20  Ht 5\' 4"  (1.626 m)  Wt 73.8 kg (162 lb 11.2 oz)  BMI 27.91 kg/m2  SpO2 96%  Intake/Output Summary (Last 24 hours) at 04/06/16 0902 Last data filed at 04/06/16 [id number]  Gross per 24 hour  Intake   3305 ml  Output   2226 ml  Net   1079 ml   Filed Weights   04/02/16 0757 04/02/16 1400  Weight: 79.379 kg (175 lb) 73.8 kg (162 lb 11.2 oz)    Exam:   General:  NAD, weak   Cardiovascular: IRRR  Respiratory: CTABL  Abdomen: Soft/ND/NT, positive BS, left sided nephrostomy  tube in place with clear urine.  Musculoskeletal: No Edema  Neuro: aaox3   Data Reviewed: Basic Metabolic Panel:  Recent Labs Lab 04/02/16 0830 04/03/16 0318 04/03/16 0928 04/04/16 0327 04/05/16 0457 04/06/16 0421  NA 136 139  --  137 136 138  K 4.1 3.5  --  3.6 3.6 3.5  CL 100* 108  --  110 108 109  CO2 24 21*  --  21* 21* 22  GLUCOSE 200* 203*  --  160* 188* 127*  BUN 45* 29*  --  21*  11 12  CREATININE 2.62* 2.02*  --  1.47* 1.35* 1.14*  CALCIUM 9.1 8.1*  --  8.3* 8.3* 8.4*  MG  --   --  1.8  --  1.5* 1.9   Liver Function Tests:  Recent Labs Lab 04/02/16 0830  AST 36  ALT 19  ALKPHOS 77  BILITOT 1.0  PROT 7.5  ALBUMIN 3.9    Recent Labs Lab 04/02/16 0830  LIPASE 19   No results for input(s): AMMONIA in the last 168 hours. CBC:  Recent Labs Lab 04/02/16 0830 04/03/16 0318 04/04/16 0327 04/05/16 0457 04/06/16 0421  WBC 20.5* 14.7* 9.4 8.0 8.7  NEUTROABS 19.1*  --   --   --   --   HGB 12.6 10.2* 9.7* 9.9* 10.4*  HCT 38.3 32.4* 30.5* 30.5* 31.6*  MCV 84.2 85.7 84.0 83.3 84.5  PLT 144* 136* 151 180 234   Cardiac Enzymes:    Recent Labs Lab 04/02/16 1525 04/02/16 2050 04/03/16 0318 04/03/16 0921  TROPONINI 0.39* 0.84* 1.03* 0.69*   BNP (last 3 results) No results for input(s): BNP in the last 8760 hours.  ProBNP (last 3 results) No results for input(s): PROBNP in the last 8760 hours.  CBG:  Recent Labs Lab 04/05/16 0741 04/05/16 1152 04/05/16 1650 04/05/16 2211 04/06/16 0738  GLUCAP 155* 132* 125* 164* 125*    Recent Results (from the past 240 hour(s))  Urine culture     Status: Abnormal   Collection Time: 04/02/16  8:24 AM  Result Value Ref Range Status   Specimen Description URINE, CLEAN CATCH  Final   Special Requests NONE  Final   Culture MULTIPLE SPECIES PRESENT, SUGGEST RECOLLECTION (A)  Final   Report Status 04/03/2016 FINAL  Final  Culture, blood (routine x 2)     Status: Abnormal   Collection Time: 04/02/16 11:38 AM  Result Value Ref Range Status   Specimen Description BLOOD RIGHT ANTECUBITAL  Final   Special Requests BOTTLES DRAWN AEROBIC AND ANAEROBIC 5ML  Final   Culture  Setup Time   Final    GRAM NEGATIVE RODS ANAEROBIC BOTTLE ONLY CRITICAL RESULT CALLED TO, READ BACK BY AND VERIFIED WITH: Chales Abrahams, PHARM AT 0934 ON [id number] BY Rhea Bleacher    Culture (A)  Final    CITROBACTER KOSERI SUSCEPTIBILITIES  PERFORMED ON PREVIOUS CULTURE WITHIN THE LAST 5 DAYS. Performed at [facility]    Report Status 04/06/2016 FINAL  Final  Culture, blood (routine x 2)     Status: Abnormal   Collection Time: 04/02/16 11:48 AM  Result Value Ref Range Status   Specimen Description BLOOD RIGHT HAND  Final   Special Requests BOTTLES DRAWN AEROBIC ONLY 5ML  Final   Culture  Setup Time   Final    GRAM NEGATIVE RODS AEROBIC BOTTLE ONLY Organism ID to follow CRITICAL RESULT CALLED TO, READ BACK BY AND VERIFIED WITH: Guadlupe Spanish, [place] D AT [place] ON [phone]  BY Rhea Bleacher Performed at [place] (A)  Final   Report Status 04/05/2016 FINAL  Final   Organism ID, Bacteria CITROBACTER KOSERI  Final      Susceptibility   Citrobacter koseri - MIC*    CEFAZOLIN <=4 SENSITIVE Sensitive     CEFEPIME <=1 SENSITIVE Sensitive     CEFTAZIDIME <=1 SENSITIVE Sensitive     CEFTRIAXONE <=1 SENSITIVE Sensitive     CIPROFLOXACIN <=0.25 SENSITIVE Sensitive     GENTAMICIN <=1 SENSITIVE Sensitive     IMIPENEM <=0.25 SENSITIVE Sensitive     TRIMETH/SULFA <=20 SENSITIVE Sensitive     PIP/TAZO <=4 SENSITIVE Sensitive     * CITROBACTER KOSERI  Blood Culture ID Panel (Reflexed)     Status: Abnormal   Collection Time: 04/02/16 11:48 AM  Result Value Ref Range Status   Enterococcus species NOT DETECTED NOT DETECTED Final   Vancomycin resistance NOT DETECTED NOT DETECTED Final   Listeria monocytogenes NOT DETECTED NOT DETECTED Final   Staphylococcus species NOT DETECTED NOT DETECTED Final   Staphylococcus aureus NOT DETECTED NOT DETECTED Final   Methicillin resistance NOT DETECTED NOT DETECTED Final   Streptococcus species NOT DETECTED NOT DETECTED Final   Streptococcus agalactiae NOT DETECTED NOT DETECTED Final   Streptococcus pneumoniae NOT DETECTED NOT DETECTED Final   Streptococcus pyogenes NOT DETECTED NOT DETECTED Final   Acinetobacter baumannii NOT DETECTED NOT DETECTED Final     Enterobacteriaceae species DETECTED (A) NOT DETECTED Final    Comment: CRITICAL RESULT CALLED TO, READ BACK BY AND VERIFIED WITH: N. GLOGOVAC, PHARM D AT 1307 ON [id number] BY S. YARBROUGH    Enterobacter cloacae complex NOT DETECTED NOT DETECTED Final   Escherichia coli NOT DETECTED NOT DETECTED Final   Klebsiella oxytoca NOT DETECTED NOT DETECTED Final   Klebsiella pneumoniae NOT DETECTED NOT DETECTED Final   Proteus species NOT DETECTED NOT DETECTED Final   Serratia marcescens NOT DETECTED NOT DETECTED Final   Carbapenem resistance NOT DETECTED NOT DETECTED Final   Haemophilus influenzae NOT DETECTED NOT DETECTED Final   Neisseria meningitidis NOT DETECTED NOT DETECTED Final   Pseudomonas aeruginosa NOT DETECTED NOT DETECTED Final   Candida albicans NOT DETECTED NOT DETECTED Final   Candida glabrata NOT DETECTED NOT DETECTED Final   Candida krusei NOT DETECTED NOT DETECTED Final   Candida parapsilosis NOT DETECTED NOT DETECTED Final   Candida tropicalis NOT DETECTED NOT DETECTED Final    Comment: Performed at [facility]  MRSA PCR Screening     Status: None   Collection Time: 04/02/16  2:08 PM  Result Value Ref Range Status   MRSA by PCR NEGATIVE NEGATIVE Final    Comment:        The GeneXpert MRSA Assay (FDA approved for NASAL specimens only), is one component of a comprehensive MRSA colonization surveillance program. It is not intended to diagnose MRSA infection nor to guide or monitor treatment for MRSA infections.   Culture, Urine     Status: Abnormal   Collection Time: 04/02/16  6:46 PM  Result Value Ref Range Status   Specimen Description URINE, SUPRAPUBIC aspirated urine  Final   Special Requests NONE  Final   Culture >=100,000 COLONIES/mL CITROBACTER KOSERI (A)  Final   Report Status 04/05/2016 FINAL  Final   Organism ID, Bacteria CITROBACTER KOSERI (A)  Final      Susceptibility   Citrobacter koseri - MIC*    CEFAZOLIN <=4 SENSITIVE Sensitive  CEFTRIAXONE <=1 SENSITIVE Sensitive     CIPROFLOXACIN <=0.25 SENSITIVE Sensitive     GENTAMICIN <=1 SENSITIVE Sensitive     IMIPENEM <=0.25 SENSITIVE Sensitive     NITROFURANTOIN <=16 SENSITIVE Sensitive     TRIMETH/SULFA <=20 SENSITIVE Sensitive     PIP/TAZO <=4 SENSITIVE Sensitive     * >=100,000 COLONIES/mL CITROBACTER KOSERI  Culture, blood (routine x 2)     Status: None (Preliminary result)   Collection Time: 04/04/16 10:15 AM  Result Value Ref Range Status   Specimen Description BLOOD RIGHT ARM  Final   Special Requests BOTTLES DRAWN AEROBIC AND ANAEROBIC 10CC  Final   Culture   Final    NO GROWTH 1 DAY Performed at [facility]    Report Status PENDING  Incomplete  Culture, blood (routine x 2)     Status: None (Preliminary result)   Collection Time: 04/04/16 10:15 AM  Result Value Ref Range Status   Specimen Description BLOOD LEFT HAND  Final   Special Requests BOTTLES DRAWN AEROBIC ONLY [place]  Final   Culture   Final    NO GROWTH 1 DAY Performed at [facility]    Report Status PENDING  Incomplete     Studies: No results found.  Scheduled Meds: . cefTRIAXone (ROCEPHIN)  IV  2 g Intravenous Q24H  . diltiazem  240 mg Oral Daily  . insulin aspart  0-15 Units Subcutaneous TID WC  . nystatin  5 mL Oral QID  . rosuvastatin  20 mg Oral Daily  . sodium chloride flush  3 mL Intravenous Q12H  . warfarin  5 mg Oral ONCE-1800  . Warfarin - Pharmacist Dosing Inpatient   Does not apply q1800    Continuous Infusions: . sodium chloride 100 mL/hr at 04/06/16 0852  . heparin 1,000 Units/hr (04/06/16 [id number])     Time spent: 13mins  Adelheid Hoggard MD, PhD  [facility] Pager [phone]. If 7PM-7AM, please contact night-coverage at [email or web address], password [facility] 04/06/2016, 9:02 AM  LOS: 4 days

## 2016-04-07 DIAGNOSIS — A4151 Sepsis due to Escherichia coli [E. coli]: Secondary | ICD-10-CM

## 2016-04-07 LAB — BASIC METABOLIC PANEL
ANION GAP: 7 (ref 5–15)
BUN: 14 mg/dL (ref 6–20)
CO2: 23 mmol/L (ref 22–32)
Calcium: 8.5 mg/dL — ABNORMAL LOW (ref 8.9–10.3)
Chloride: 108 mmol/L (ref 101–111)
Creatinine, Ser: 1.35 mg/dL — ABNORMAL HIGH (ref 0.44–1.00)
GFR calc Af Amer: 43 mL/min — ABNORMAL LOW (ref >60)
GFR calc non Af Amer: 37 mL/min — ABNORMAL LOW (ref >60)
GLUCOSE: 143 mg/dL — AB (ref 65–99)
POTASSIUM: 3.8 mmol/L (ref 3.5–5.1)
Sodium: 138 mmol/L (ref 135–145)

## 2016-04-07 LAB — CBC
HEMATOCRIT: 30.9 % — AB (ref 36.0–46.0)
Hemoglobin: 10 g/dL — ABNORMAL LOW (ref 12.0–15.0)
MCH: 27.2 pg (ref 26.0–34.0)
MCHC: 32.4 g/dL (ref 30.0–36.0)
MCV: 84.2 fL (ref 78.0–100.0)
Platelets: 275 10*3/uL (ref 150–400)
RBC: 3.67 MIL/uL — AB (ref 3.87–5.11)
RDW: 14.8 % (ref 11.5–15.5)
WBC: 9.6 10*3/uL (ref 4.0–10.5)

## 2016-04-07 LAB — C DIFFICILE QUICK SCREEN W PCR REFLEX
C DIFFICILE (CDIFF) INTERP: NEGATIVE
C DIFFICILE (CDIFF) TOXIN: NEGATIVE
C DIFFICLE (CDIFF) ANTIGEN: NEGATIVE

## 2016-04-07 LAB — GLUCOSE, CAPILLARY
GLUCOSE-CAPILLARY: 140 mg/dL — AB (ref 65–99)
GLUCOSE-CAPILLARY: 125 mg/dL — AB (ref 65–99)

## 2016-04-07 LAB — MAGNESIUM: Magnesium: 1.7 mg/dL (ref 1.7–2.4)

## 2016-04-07 LAB — HEPARIN LEVEL (UNFRACTIONATED): Heparin Unfractionated: 0.39 IU/mL (ref 0.30–0.70)

## 2016-04-07 LAB — PROTIME-INR
INR: 1.08 (ref 0.00–1.49)
Prothrombin Time: 13.8 seconds (ref 11.6–15.2)

## 2016-04-07 MED ORDER — WARFARIN SODIUM 5 MG PO TABS: 10.0000 mg | ORAL_TABLET | Freq: Once | ORAL | Status: DC

## 2016-04-07 MED ORDER — WARFARIN SODIUM 5 MG PO TABS
10.0000 mg | ORAL_TABLET | Freq: Once | ORAL | Status: AC
  Administered 2016-04-07: 10 mg via ORAL
  Filled 2016-04-07: qty 2

## 2016-04-07 MED ORDER — ENOXAPARIN SODIUM 120 MG/0.8ML ~~LOC~~ SOLN
1.5000 mg/kg | SUBCUTANEOUS | Status: DC
  Administered 2016-04-07: 110 mg via SUBCUTANEOUS
  Filled 2016-04-07: qty 0.8

## 2016-04-07 MED ORDER — ENOXAPARIN (LOVENOX) PATIENT EDUCATION KIT
PACK | Freq: Once | Status: AC
  Administered 2016-04-07: 12:00:00
  Filled 2016-04-07: qty 1

## 2016-04-07 MED ORDER — DILTIAZEM HCL ER COATED BEADS 240 MG PO CP24: 240.0000 mg | ORAL_CAPSULE | Freq: Every day | ORAL | Status: DC

## 2016-04-07 MED ORDER — ENOXAPARIN SODIUM 150 MG/ML ~~LOC~~ SOLN: 1.5000 mg/kg | SUBCUTANEOUS | Status: DC

## 2016-04-07 MED ORDER — GLIPIZIDE 5 MG PO TABS: 2.5000 mg | ORAL_TABLET | Freq: Every day | ORAL | Status: DC

## 2016-04-07 MED ORDER — CEPHALEXIN 750 MG PO CAPS: 750.0000 mg | ORAL_CAPSULE | Freq: Three times a day (TID) | ORAL | Status: DC

## 2016-04-07 MED ORDER — WARFARIN SODIUM 7.5 MG PO TABS: 7.5000 mg | ORAL_TABLET | Freq: Every day | ORAL | Status: DC

## 2016-04-07 MED ORDER — NYSTATIN 100000 UNIT/ML MT SUSP: 5.0000 mL | Freq: Four times a day (QID) | OROMUCOSAL | Status: DC

## 2016-04-07 NOTE — Progress Notes (Signed)
Patient ID: Audrey Mullen, female   DOB: [birth year], 75 y.o.   MRN: [id number] Patient ID: Audrey Mullen, female   DOB: 1940-12-06, 75 y.o.   MRN: [id number]   Patient Name: Audrey Mullen Date of Encounter: 04/07/2016     Principal Problem:   Sepsis [facility]) Active Problems:   AKI (acute kidney injury) ([place])   Pyelonephritis, acute   Atrial fibrillation with RVR ([place])   Hydronephrosis   Ureteral stone with hydronephrosis   Bacteremia    SUBJECTIVE  No complaints. Wants to know when she can have stones out.  CURRENT MEDS . cefTRIAXone (ROCEPHIN)  IV  2 g Intravenous Q24H  . diltiazem  240 mg Oral Daily  . insulin aspart  0-15 Units Subcutaneous TID WC  . nystatin  5 mL Oral QID  . rosuvastatin  20 mg Oral Daily  . sodium chloride flush  3 mL Intravenous Q12H  . Warfarin - Pharmacist Dosing Inpatient   Does not apply q1800    OBJECTIVE  Filed Vitals:   04/06/16 0455 04/06/16 1344 04/06/16 2100 04/07/16 0519  BP: 137/74 135/77 128/69 130/60  Pulse: 93 90 82 77  Temp: 98.3 F (36.8 C) 97.9 F (36.6 C) 98.6 F (37 C) 98.1 F (36.7 C)  TempSrc: Oral Oral Oral Oral  Resp: 20 18 20 19   Height:      Weight:      SpO2: 96% 97% 97% 98%    Intake/Output Summary (Last 24 hours) at 04/07/16 0842 Last data filed at 04/07/16 0717  Gross per 24 hour  Intake    410 ml  Output    775 ml  Net   -365 ml   Filed Weights   04/02/16 0757 04/02/16 1400  Weight: 79.379 kg (175 lb) 73.8 kg (162 lb 11.2 oz)    PHYSICAL EXAM  General: Pleasant, NAD. Neuro: Alert and oriented X 3. Moves all extremities spontaneously. Psych: Normal affect. HEENT:  Normal  Neck: Supple without bruits or JVD. Lungs:  Resp regular and unlabored, CTA. Heart: irreg irreg no s3, s4, or murmurs. Abdomen: Soft, non-tender, non-distended, BS + x 4. S/p nephrostomy tube  Extremities: No clubbing, cyanosis or edema. DP/PT/Radials 2+ and equal bilaterally.  Accessory Clinical  Findings  CBC  Recent Labs  04/06/16 0421 04/07/16 0525  WBC 8.7 9.6  HGB 10.4* 10.0*  HCT 31.6* 30.9*  MCV 84.5 84.2  PLT 234 [id number]   Basic Metabolic Panel  Recent Labs  04/06/16 0421 04/07/16 0525  NA 138 138  K 3.5 3.8  CL 109 108  CO2 22 23  GLUCOSE 127* 143*  BUN 12 14  CREATININE 1.14* 1.35*  CALCIUM 8.4* 8.5*  MG 1.9 1.7   Liver Function Tests No results for input(s): AST, ALT, ALKPHOS, BILITOT, PROT, ALBUMIN in the last 72 hours. No results for input(s): LIPASE, AMYLASE in the last 72 hours. Cardiac Enzymes No results for input(s): CKTOTAL, CKMB, CKMBINDEX, TROPONINI in the last 72 hours. BNP Invalid input(s): POCBNP D-Dimer No results for input(s): DDIMER in the last 72 hours. Hemoglobin A1C No results for input(s): HGBA1C in the last 72 hours. Fasting Lipid Panel No results for input(s): CHOL, HDL, LDLCALC, TRIG, CHOLHDL, LDLDIRECT in the last 72 hours. Thyroid Function Tests No results for input(s): TSH, T4TOTAL, T3FREE, THYROIDAB in the last 72 hours.  Invalid input(s): FREET3  TELE  afib with rate control HR low 100s  Radiology/Studies  Ct Abdomen Pelvis Wo Contrast  04/02/2016  CLINICAL DATA:  Left lower quadrant pain, epigastric pain and nausea starting Sunday EXAM: CT ABDOMEN AND PELVIS WITHOUT CONTRAST TECHNIQUE: Multidetector CT imaging of the abdomen and pelvis was performed following the standard protocol without IV contrast. COMPARISON:  02/03/2007 FINDINGS: Lower chest: Lung bases shows no infiltrate or pulmonary edema. Mild bronchitic changes are noted bilateral lower lobe. Hepatobiliary: The unenhanced liver shows no biliary ductal dilatation. No calcified gallstones are noted within gallbladder. No CBD dilatation. Pancreas: The unenhanced pancreas is unremarkable. Spleen: Unenhanced spleen is unremarkable. Adrenals/Urinary Tract: Bilateral adrenal adenomas are stable. On the right side measures 2.5 cm. Left adrenal adenoma measures 1.6 cm.  There is significant left perinephric stranding. Moderate left hydronephrosis. Stable cortical scarring and small calculi in upper pole of the left kidney. Axial image 36 there is 8 mm calcified obstructive calculus in left UPJ. Nonobstructive calculus in midpole of the left kidney posterior aspect measures 4 mm. No right nephrolithiasis. No right hydronephrosis or hydroureter. Bilateral distal ureter is small caliber. There is a probable cyst in lower pole posterior aspect of the right kidney measures 1.8 cm. No calcified calculi are noted within urinary bladder. Stomach/Bowel: Small hiatal hernia. No gastric outlet obstruction. No small bowel obstruction. Oral contrast material was given to the patient. Terminal ileum is unremarkable. No pericecal inflammation. Normal appendix is noted in axial image 52. No distal colonic obstruction. Few colonic diverticula are noted descending colon and proximal sigmoid colon. No evidence of colitis or diverticulitis. Vascular/Lymphatic: Atherosclerotic calcifications of abdominal aorta and iliac arteries. No aortic aneurysm. No retroperitoneal or mesenteric adenopathy. Reproductive: Unenhanced uterus is atrophic.  No adnexal mass. Other: No ascites or free air. There is a tiny umbilical hernia containing fat without evidence of acute complication. Small right inguinal canal hernia containing fat measures 2 cm without evidence of acute complication. Musculoskeletal: No destructive bony lesions are noted. Mild levoscoliosis lumbar spine. Sagittal images of the spine shows significant degenerative changes lumbar spine and lower thoracic spine. Multilevel vacuum disc phenomenon anterior and posterior spurring noted lower thoracic and lumbar spine. IMPRESSION: 1. There is moderate left hydronephrosis. Mild left perinephric stranding. 2. Axial image 36 there is 8 mm calcified obstructive calculus in left UPJ. Nonobstructive calculus in mid to lower pole of the left kidney posterior  aspect measures 4 mm. Again noted significant scarring and small cortical calcifications in upper pole of the left kidney. 3. No right nephrolithiasis. Probable cyst in lower pole of the right kidney measures 1.9 cm. Bilateral distal ureter is unremarkable. No right hydronephrosis or hydroureter. 4. Normal appendix.  No pericecal inflammation. 5. Extensive degenerative changes thoracolumbar spine. 6. No calcified calculi are noted within urinary bladder. 7. Atrophic uterus. 8. Stable bilateral adrenal gland adenomas. Electronically Signed   By: Lahoma Crocker M.D.   On: 04/02/2016 11:02   Dg Chest 2 View  04/02/2016  CLINICAL DATA:  Cough, congestion, shortness of breath for 3 days EXAM: CHEST  2 VIEW COMPARISON:  01/31/2005 FINDINGS: Cardiomediastinal silhouette is stable. No infiltrate or pleural effusion. No pulmonary edema. Mild perihilar bronchitic changes. Mild degenerative changes thoracic spine. IMPRESSION: No infiltrate or pulmonary edema. Mild perihilar bronchitic changes. Degenerative changes thoracic spine. Electronically Signed   By: Lahoma Crocker M.D.   On: 04/02/2016 09:13    2D ECHO: 04/03/2016 LV EF: 55% - 60% Study Conclusions - Left ventricle: The cavity size was normal. There was mild focal  basal hypertrophy of the septum. Systolic function was normal.  The estimated ejection fraction was in the  range of 55% to 60%.  Wall motion was normal; there were no regional wall motion  abnormalities. Left ventricular diastolic function parameters  were normal. - Aortic valve: Trileaflet; moderately thickened, moderately  calcified leaflets. - Left atrium: The atrium was mildly dilated.   ASSESSMENT AND PLAN  Audrey Mullen is a 75 y.o. female with a history of HTN, DMT2, HLD, nephrolithiasis and no prior cardiac history who presented to [facility] on 04/02/16 with flank pain and n/v. She was found to have sepsis 2/2 acute pyelonephritis and obstructive uropathy. She was also found  to have afib with RVR and troponin elevation  Newly diagnosed Afib with RVR: Switched to PO Cardizem CD  240 mg PO  -- TSH normal. Likely precipitated by sepsis. -- CHADSvasc score 5 (HTN, age, F sex, DM).On heparin and coumadin  Lab Results  Component Value Date   INR 1.08 04/07/2016   INR 1.06 04/06/2016   INR 1.13 04/02/2016    -- 2D ECHO 04/03/16 with mild focal basal hypertrophy of septum. EF 55-60%, no RWMA, mild LAE.  -- Would likely plan for rate control since she is asymptomatic. If she remains in afib we could consider DCCV after at least 5 weeks of theraputic INRs  - would be more aggressive with coumadin dosing per pharmacy or donsider lovenox for d/c   Troponin elevation: 0.25--> 1.03--> 0.69. likely demand in the setting of sepsis, acute kidney injury and afib with rvr. She has had no chest pain. Likely outpatient myoview  AKI: creat up to 2.62--> 2.02--> 1.47--> 1.35-> 1.14   which is probably her baseline.    HTN: Home meds spiro-HCTZ 25-25mg  held. Cardizem increased to 240 mg   HLD: continue crestor  Diet controlled T2DM: a1c 7.5  Sepsis: presented with fever, WBC 20,500, creatinine of 2.62, troponin of 0.25, lactic acid of 2.3, heart rates in the 160s. Source of infection likely from pyelonephritis in setting of obstructive uropathy. Improving.  Acute pyelonephritis/obstructive uropathy/AKI. CT scan that showed an 8 mm calcified obstructive calculus in the left UPJ with moderate left hydronephrosis. S/p percutaneous drainage catheter on 5/16. Not clear to me what ultimate plan is Her anticoagulation can be held at anytime if surgery or other definitive procedure is needed. Patient is not clear How long drain is to be left in   [employer]

## 2016-04-07 NOTE — Discharge Summary (Signed)
Discharge Summary  Audrey Mullen [id number] DOB: [birth year]  PCP: Elyn Peers, MD  Admit date: 04/02/2016 Discharge date: 04/07/2016  Time spent: >39mins  Recommendations for Outpatient Follow-up:  1. F/u with cardiology in a week , cardiology office to monitor INR /lovenox/comadin dosing 2. PMD within a week  for hospital discharge follow up, repeat cbc/bmp at follow up, pmd to monitor blood sugar control 3. F/u with urology for left UPJ stone 4. F/u with  Interventional radiology , s/p left nephrostomy tube  Discharge Diagnoses:  Active Hospital Problems   Diagnosis Date Noted  . Sepsis ([place]) 04/02/2016  . Bacteremia   . Hydronephrosis   . Ureteral stone with hydronephrosis   . AKI (acute kidney injury) ([place]) 04/02/2016  . Pyelonephritis, acute 04/02/2016  . Atrial fibrillation with RVR ([place]) 04/02/2016    Resolved Hospital Problems   Diagnosis Date Noted Date Resolved  No resolved problems to display.    Discharge Condition: stable  Diet recommendation: heart healthy/carb modified  Filed Weights   04/02/16 0757 04/02/16 1400  Weight: 79.379 kg (175 lb) 73.8 kg (162 lb 11.2 oz)    History of present illness:  Chief Complaint: Nausea/vomiting lash left flank pain  HPI: ROLLA Mullen is a 75 y.o. female with medical history significant of diabetes mellitus, also arthritis, dyslipidemia, presented to the emergency department with complaints of nausea vomiting associate with left flank pain. She states symptoms started on Sunday where she developed multiple episodes of nausea vomiting, bringing up green emesis. This was associate with left flank pain, subjective fevers, chills, malaise, generalized weakness, fatigue, feeling ill. She also complains of foul-smelling urine although has not had dysuria. Prior to this she reports that she had been doing well, resides at home with her husband and is independent on all activities of daily living.   ED  Course: In the emergency room she was found to be in atrial fibrillation with rapid ventricular response and initially treated with IV Cardizem. Lab work revealed elevated lactate of 2.3, white count of 20,500 with CT scan of abdomen and pelvis showing left-sided moderate hydronephrosis. I spoke with Dr. Gaynelle Arabian of urology from the emergency department who felt she would be a better candidate for interventional radiology consultation for percutaneous drainage catheter. Better candidate for a percutaneous drainage catheter placement.   Hospital Course:  Principal Problem:   Sepsis ([place]) Active Problems:   AKI (acute kidney injury) ([place])   Pyelonephritis, acute   Atrial fibrillation with RVR ([place])   Hydronephrosis   Ureteral stone with hydronephrosis   Bacteremia  Sepsis:  Present on admission, evidenced by fever, a white count of 20,500, creatinine of 2.62, troponin of 0.25, lactic acid of 2.3, heart rates in the 160s. Source of infection likely from pyelonephritis in setting of obstructive uropathy. And bacteremia, on sepsis protocol start empiric IV antibiotic therapy with ceftriaxone 2 g IV every 24 hours. Blood and urine culture +citrobacter sensitive to rocephin, repeat blood culture no growth admitted to the stepdown unit initially, better, transfer to med tele on 5/18 Resolved, discharge home with keflex for 9 more days to finish total of 14day abx treatment.   Acute pyelonephritis/obstructive uropathy/AKI.  She presents septic, having left leg pain and nausea and vomiting workup in the emergency room included a CT scan that showed an 8 mm calcified obstructive calculus in the left UPJ. This was associated with moderate left hydronephrosis.  ceftriaxone 2 g IV every 24 hours.  Dr. Gaynelle Arabian of urology who advised  to have interventional radiology place a percutaneous drainage catheter which was done on 5/16 Improving, cleared to be discharge home by urology , close follow up  with urology  Bacteremia: received IV rocephin in the hospital, repeat blood culture no growth,  Discharge with oral keflex.   Afib/rvr: new diagnose.  tsh 0.35, keep k>4, mag >2,  CHADSvasc score 4 with age, female, h/o diet controlled dm2. Was not previously on meds or anticoagulation,  She was on cardizem drip started from admission, now on oral cardizem, cardiology started heparin drip and coumadin meds and anticoagulation per cardiology,  Cardiology cleared patient to be discharged home with lovenox/comadin, home health RN to check INR daily, result report to cardiology , outpatient dosing of lovenox/coumadin per cardiology.  Troponin elevation: in the setting of sepsis/aki/afib, denies chest pain, bp stable. ekg afib/non specific st/t flattening. Echo LVEF wnl, no WMA, cardiology consulted.  H/o HTN; Home meds aldactazide held, now on cardizem  H/o hld: continue crestor  H/o diet controlled dm2, a1c 7.5, on ssi here, discharged with glypizide 2.5mg  po daily. With elevated cr not a candidate for metformin.  Oral thrush: topical nystatin, better  Hypokalemia/hyponagenesemia: replace k/mag, keep mag>2, k>4.  Code Status: full  Family Communication: patient ad husband in room  Disposition Plan: out of Stepdown to med tele on 5/18, home on 5/21 with home health   Consultants:  Urology for nephrolithiasis and obstructive uropathy  IR for perc drain on 5/16  Cardiology for afib (new diagnose), troponin elevation  Procedures:  Left PCN insertion on 5/16 by IR  Antibiotics:  Rocephin from admission   Discharge Exam: BP 130/60 mmHg  Pulse 77  Temp(Src) 98.1 F (36.7 C) (Oral)  Resp 19  Ht 5\' 4"  (1.626 m)  Wt 73.8 kg (162 lb 11.2 oz)  BMI 27.91 kg/m2  SpO2 98%   General: NAD, weak   Cardiovascular: IRRR  Respiratory: CTABL  Abdomen: Soft/ND/NT, positive BS, left sided nephrostomy tube in place with clear urine.  Musculoskeletal: No Edema  Neuro:  aaox3   Discharge Instructions You were cared for by a hospitalist during your hospital stay. If you have any questions about your discharge medications or the care you received while you were in the hospital after you are discharged, you can call the unit and asked to speak with the hospitalist on call if the hospitalist that took care of you is not available. Once you are discharged, your primary care physician will handle any further medical issues. Please note that NO REFILLS for any discharge medications will be authorized once you are discharged, as it is imperative that you return to your primary care physician (or establish a relationship with a primary care physician if you do not have one) for your aftercare needs so that they can reassess your need for medications and monitor your lab values.      Discharge Instructions    Diet - low sodium heart healthy    Complete by:  As directed   Carb modified     Face-to-face encounter (required for Medicare/Medicaid patients)    Complete by:  As directed   I Cathy Ropp certify that this patient is under my care and that I, or a nurse practitioner or physician's assistant working with me, had a face-to-face encounter that meets the physician face-to-face encounter requirements with this patient on 04/07/2016. The encounter with the patient was in whole, or in part for the following medical condition(s) which is the primary reason for home  health care (List medical condition): FTT,  RN for nephrostomy tube care and medication supervision and INR check daily, patient is newly started on coumadin with lovenox injection. Please send  INR result to cardiology office, futher adjustment of lovenox and coumadin dosing per cardiology office.  The encounter with the patient was in whole, or in part, for the following medical condition, which is the primary reason for home health care:  FTT  I certify that, based on my findings, the following services are medically  necessary home health services:  Nursing  Reason for Medically Necessary Home Health Services:  Skilled Nursing- Change/Decline in Patient Status  My clinical findings support the need for the above services:  OTHER SEE COMMENTS  Further, I certify that my clinical findings support that this patient is homebound due to:  Immunocompromised     Home Health    Complete by:  As directed   To provide the following care/treatments:  RN     Increase activity slowly    Complete by:  As directed             Medication List    STOP taking these medications        fluticasone 50 MCG/ACT nasal spray  Commonly known as:  FLONASE     spironolactone-hydrochlorothiazide 25-25 MG tablet  Commonly known as:  ALDACTAZIDE      TAKE these medications        cephALEXin 750 MG capsule  Commonly known as:  KEFLEX  Take 1 capsule (750 mg total) by mouth 3 (three) times daily.     diltiazem 240 MG 24 hr capsule  Commonly known as:  CARDIZEM CD  Take 1 capsule (240 mg total) by mouth daily.     enoxaparin 150 MG/ML injection  Commonly known as:  LOVENOX  Inject 0.74 mLs (110 mg total) into the skin daily.     glipiZIDE 5 MG tablet  Commonly known as:  GLUCOTROL  Take 0.5 tablets (2.5 mg total) by mouth daily before breakfast.     nystatin 100000 UNIT/ML suspension  Commonly known as:  MYCOSTATIN  Take 5 mLs (500,000 Units total) by mouth 4 (four) times daily.     rosuvastatin 20 MG tablet  Commonly known as:  CRESTOR  Take 20 mg by mouth daily.     warfarin 7.5 MG tablet  Commonly known as:  COUMADIN  Take 1 tablet (7.5 mg total) by mouth daily at 6 PM.       No Known Allergies Follow-up Information    Follow up with Elyn Peers, MD In 1 week.   Specialty:  Family Medicine   Why:  hospital discharge follow up, repeat cbc/bmp at follow up, f/u blood sugar control .   Contact information:   [address] [place] [address] [phone]       Follow up with Ailene Rud, MD In 2 weeks.   Specialty:  Urology   Why:  Left UPJ stone   Contact information:   [place] [address] [phone]       Follow up with Jenkins Rouge, MD In 2 weeks.   Specialty:  Cardiology   Why:  afib, newly started on coumadin, cardiology to monitor INR   Contact information:   [address] [place] [address] [phone]       Follow up with HASSELL III, Wynona Luna, MD In 1 week.   Specialty:  Interventional Radiology   Why:  please call interventional radiology next week to arrange follow up for nephrostomy tube    Contact information:   [place] [address] [phone]        The results of significant diagnostics from this hospitalization (including imaging, microbiology, ancillary and laboratory) are listed below for reference.    Significant Diagnostic Studies: Ct Abdomen Pelvis Wo Contrast  04/02/2016  CLINICAL DATA:  Left lower quadrant pain, epigastric pain and nausea starting Sunday EXAM: CT ABDOMEN AND PELVIS WITHOUT CONTRAST TECHNIQUE: Multidetector CT imaging of the abdomen and pelvis was performed following the standard protocol without IV contrast. COMPARISON:  02/03/2007 FINDINGS: Lower chest: Lung bases shows no infiltrate or pulmonary edema. Mild bronchitic changes are noted bilateral lower lobe. Hepatobiliary: The unenhanced liver shows no biliary ductal dilatation. No calcified gallstones are noted within gallbladder. No CBD dilatation. Pancreas: The unenhanced pancreas is unremarkable. Spleen: Unenhanced spleen is unremarkable. Adrenals/Urinary Tract: Bilateral adrenal adenomas are stable. On the right side measures 2.5 cm. Left adrenal adenoma measures 1.6 cm. There is significant left perinephric stranding. Moderate left hydronephrosis. Stable cortical scarring and small calculi in upper pole of the left kidney. Axial image 36 there is 8 mm calcified obstructive calculus in  left UPJ. Nonobstructive calculus in midpole of the left kidney posterior aspect measures 4 mm. No right nephrolithiasis. No right hydronephrosis or hydroureter. Bilateral distal ureter is small caliber. There is a probable cyst in lower pole posterior aspect of the right kidney measures 1.8 cm. No calcified calculi are noted within urinary bladder. Stomach/Bowel: Small hiatal hernia. No gastric outlet obstruction. No small bowel obstruction. Oral contrast material was given to the patient. Terminal ileum is unremarkable. No pericecal inflammation. Normal appendix is noted in axial image 52. No distal colonic obstruction. Few colonic diverticula are noted descending colon and proximal sigmoid colon. No evidence of colitis or diverticulitis. Vascular/Lymphatic: Atherosclerotic calcifications of abdominal aorta and iliac arteries. No aortic aneurysm. No retroperitoneal or mesenteric adenopathy. Reproductive: Unenhanced uterus is atrophic.  No adnexal mass. Other: No ascites or free air. There is a tiny umbilical hernia containing fat without evidence of acute complication. Small right inguinal canal hernia containing fat measures 2 cm without evidence of acute complication. Musculoskeletal: No destructive bony lesions are noted. Mild levoscoliosis lumbar spine. Sagittal images of the spine shows significant degenerative changes lumbar spine and lower thoracic spine. Multilevel vacuum disc phenomenon anterior and posterior spurring noted lower thoracic and lumbar spine. IMPRESSION: 1. There is moderate left hydronephrosis. Mild left perinephric stranding. 2. Axial image 36 there is 8 mm calcified obstructive calculus in left UPJ. Nonobstructive calculus in mid to lower pole of the left kidney posterior aspect measures 4 mm. Again noted significant scarring and small cortical calcifications in upper pole of the left kidney. 3. No right nephrolithiasis. Probable cyst in lower pole of the right kidney measures 1.9 cm.  Bilateral distal ureter is unremarkable. No right hydronephrosis or hydroureter. 4. Normal appendix.  No pericecal inflammation. 5. Extensive degenerative changes thoracolumbar spine. 6. No calcified calculi are noted within urinary bladder. 7. Atrophic uterus. 8. Stable bilateral adrenal gland adenomas. Electronically Signed   By: Lahoma Crocker M.D.   On: 04/02/2016 11:02   Dg Chest 2 View  04/02/2016  CLINICAL DATA:  Cough, congestion, shortness of breath for 3 days EXAM: CHEST  2 VIEW COMPARISON:  01/31/2005 FINDINGS: Cardiomediastinal silhouette is stable. No infiltrate or pleural effusion. No pulmonary edema. Mild  perihilar bronchitic changes. Mild degenerative changes thoracic spine. IMPRESSION: No infiltrate or pulmonary edema. Mild perihilar bronchitic changes. Degenerative changes thoracic spine. Electronically Signed   By: Lahoma Crocker M.D.   On: 04/02/2016 09:13    Microbiology: Recent Results (from the past 240 hour(s))  Urine culture     Status: Abnormal   Collection Time: 04/02/16  8:24 AM  Result Value Ref Range Status   Specimen Description URINE, CLEAN CATCH  Final   Special Requests NONE  Final   Culture MULTIPLE SPECIES PRESENT, SUGGEST RECOLLECTION (A)  Final   Report Status 04/03/2016 FINAL  Final  Culture, blood (routine x 2)     Status: Abnormal   Collection Time: 04/02/16 11:38 AM  Result Value Ref Range Status   Specimen Description BLOOD RIGHT ANTECUBITAL  Final   Special Requests BOTTLES DRAWN AEROBIC AND ANAEROBIC 5ML  Final   Culture  Setup Time   Final    GRAM NEGATIVE RODS ANAEROBIC BOTTLE ONLY CRITICAL RESULT CALLED TO, READ BACK BY AND VERIFIED WITH: Chales Abrahams, PHARM AT 0934 ON [id number] BY Rhea Bleacher    Culture (A)  Final    CITROBACTER KOSERI SUSCEPTIBILITIES PERFORMED ON PREVIOUS CULTURE WITHIN THE LAST 5 DAYS. Performed at [facility]    Report Status 04/06/2016 FINAL  Final  Culture, blood (routine x 2)     Status: Abnormal   Collection Time:  04/02/16 11:48 AM  Result Value Ref Range Status   Specimen Description BLOOD RIGHT HAND  Final   Special Requests BOTTLES DRAWN AEROBIC ONLY 5ML  Final   Culture  Setup Time   Final    GRAM NEGATIVE RODS AEROBIC BOTTLE ONLY Organism ID to follow CRITICAL RESULT CALLED TO, READ BACK BY AND VERIFIED WITH: Guadlupe Spanish, [place] ON [id number] BY Rhea Bleacher Performed at [facility] (A)  Final   Report Status 04/05/2016 FINAL  Final   Organism ID, Bacteria CITROBACTER KOSERI  Final      Susceptibility   Citrobacter koseri - MIC*    CEFAZOLIN <=4 SENSITIVE Sensitive     CEFEPIME <=1 SENSITIVE Sensitive     CEFTAZIDIME <=1 SENSITIVE Sensitive     CEFTRIAXONE <=1 SENSITIVE Sensitive     CIPROFLOXACIN <=0.25 SENSITIVE Sensitive     GENTAMICIN <=1 SENSITIVE Sensitive     IMIPENEM <=0.25 SENSITIVE Sensitive     TRIMETH/SULFA <=20 SENSITIVE Sensitive     PIP/TAZO <=4 SENSITIVE Sensitive     * CITROBACTER KOSERI  Blood Culture ID Panel (Reflexed)     Status: Abnormal   Collection Time: 04/02/16 11:48 AM  Result Value Ref Range Status   Enterococcus species NOT DETECTED NOT DETECTED Final   Vancomycin resistance NOT DETECTED NOT DETECTED Final   Listeria monocytogenes NOT DETECTED NOT DETECTED Final   Staphylococcus species NOT DETECTED NOT DETECTED Final   Staphylococcus aureus NOT DETECTED NOT DETECTED Final   Methicillin resistance NOT DETECTED NOT DETECTED Final   Streptococcus species NOT DETECTED NOT DETECTED Final   Streptococcus agalactiae NOT DETECTED NOT DETECTED Final   Streptococcus pneumoniae NOT DETECTED NOT DETECTED Final   Streptococcus pyogenes NOT DETECTED NOT DETECTED Final   Acinetobacter baumannii NOT DETECTED NOT DETECTED Final   Enterobacteriaceae species DETECTED (A) NOT DETECTED Final    Comment: CRITICAL RESULT CALLED TO, READ BACK BY AND VERIFIED WITH: Guadlupe Spanish, PHARM D AT 1307 ON [id number] BY S. YARBROUGH     Enterobacter cloacae complex  NOT DETECTED NOT DETECTED Final   Escherichia coli NOT DETECTED NOT DETECTED Final   Klebsiella oxytoca NOT DETECTED NOT DETECTED Final   Klebsiella pneumoniae NOT DETECTED NOT DETECTED Final   Proteus species NOT DETECTED NOT DETECTED Final   Serratia marcescens NOT DETECTED NOT DETECTED Final   Carbapenem resistance NOT DETECTED NOT DETECTED Final   Haemophilus influenzae NOT DETECTED NOT DETECTED Final   Neisseria meningitidis NOT DETECTED NOT DETECTED Final   Pseudomonas aeruginosa NOT DETECTED NOT DETECTED Final   Candida albicans NOT DETECTED NOT DETECTED Final   Candida glabrata NOT DETECTED NOT DETECTED Final   Candida krusei NOT DETECTED NOT DETECTED Final   Candida parapsilosis NOT DETECTED NOT DETECTED Final   Candida tropicalis NOT DETECTED NOT DETECTED Final    Comment: Performed at [facility]  MRSA PCR Screening     Status: None   Collection Time: 04/02/16  2:08 PM  Result Value Ref Range Status   MRSA by PCR NEGATIVE NEGATIVE Final    Comment:        The GeneXpert MRSA Assay (FDA approved for NASAL specimens only), is one component of a comprehensive MRSA colonization surveillance program. It is not intended to diagnose MRSA infection nor to guide or monitor treatment for MRSA infections.   Culture, Urine     Status: Abnormal   Collection Time: 04/02/16  6:46 PM  Result Value Ref Range Status   Specimen Description URINE, SUPRAPUBIC aspirated urine  Final   Special Requests NONE  Final   Culture >=100,000 COLONIES/mL CITROBACTER KOSERI (A)  Final   Report Status 04/05/2016 FINAL  Final   Organism ID, Bacteria CITROBACTER KOSERI (A)  Final      Susceptibility   Citrobacter koseri - MIC*    CEFAZOLIN <=4 SENSITIVE Sensitive     CEFTRIAXONE <=1 SENSITIVE Sensitive     CIPROFLOXACIN <=0.25 SENSITIVE Sensitive     GENTAMICIN <=1 SENSITIVE Sensitive     IMIPENEM <=0.25 SENSITIVE Sensitive     NITROFURANTOIN <=16 SENSITIVE  Sensitive     TRIMETH/SULFA <=20 SENSITIVE Sensitive     PIP/TAZO <=4 SENSITIVE Sensitive     * >=100,000 COLONIES/mL CITROBACTER KOSERI  Culture, blood (routine x 2)     Status: None (Preliminary result)   Collection Time: 04/04/16 10:15 AM  Result Value Ref Range Status   Specimen Description BLOOD RIGHT ARM  Final   Special Requests BOTTLES DRAWN AEROBIC AND ANAEROBIC 10CC  Final   Culture   Final    NO GROWTH 2 DAYS Performed at [facility]    Report Status PENDING  Incomplete  Culture, blood (routine x 2)     Status: None (Preliminary result)   Collection Time: 04/04/16 10:15 AM  Result Value Ref Range Status   Specimen Description BLOOD LEFT HAND  Final   Special Requests BOTTLES DRAWN AEROBIC ONLY [place]  Final   Culture   Final    NO GROWTH 2 DAYS Performed at [facility]    Report Status PENDING  Incomplete     Labs: Basic Metabolic Panel:  Recent Labs Lab 04/03/16 0318 04/03/16 0928 04/04/16 0327 04/05/16 0457 04/06/16 0421 04/07/16 0525  NA 139  --  137 136 138 138  K 3.5  --  3.6 3.6 3.5 3.8  CL 108  --  110 108 109 108  CO2 21*  --  21* 21* 22 23  GLUCOSE 203*  --  160* 188* 127* 143*  BUN 29*  --  21* 11 12 14   CREATININE 2.02*  --  1.47* 1.35* 1.14* 1.35*  CALCIUM 8.1*  --  8.3* 8.3* 8.4* 8.5*  MG  --  1.8  --  1.5* 1.9 1.7   Liver Function Tests:  Recent Labs Lab 04/02/16 0830  AST 36  ALT 19  ALKPHOS 77  BILITOT 1.0  PROT 7.5  ALBUMIN 3.9    Recent Labs Lab 04/02/16 0830  LIPASE 19   No results for input(s): AMMONIA in the last 168 hours. CBC:  Recent Labs Lab 04/02/16 0830 04/03/16 0318 04/04/16 0327 04/05/16 0457 04/06/16 0421 04/07/16 0525  WBC 20.5* 14.7* 9.4 8.0 8.7 9.6  NEUTROABS 19.1*  --   --   --   --   --   HGB 12.6 10.2* 9.7* 9.9* 10.4* 10.0*  HCT 38.3 32.4* 30.5* 30.5* 31.6* 30.9*  MCV 84.2 85.7 84.0 83.3 84.5 84.2  PLT 144* 136* 151 180 234 275   Cardiac Enzymes:  Recent Labs Lab  04/02/16 1525 04/02/16 2050 04/03/16 0318 04/03/16 0921  TROPONINI 0.39* 0.84* 1.03* 0.69*   BNP: BNP (last 3 results) No results for input(s): BNP in the last 8760 hours.  ProBNP (last 3 results) No results for input(s): PROBNP in the last 8760 hours.  CBG:  Recent Labs Lab 04/06/16 0738 04/06/16 1216 04/06/16 1638 04/06/16 2058 04/07/16 0812  GLUCAP 125* 119* 140* 150* 140*       Signed:  Tatumn Corbridge MD, PhD  [facility] 04/07/2016, 11:57 AM

## 2016-04-07 NOTE — Care Management Note (Signed)
Case Management Note  Patient Details  Name: Audrey Mullen MRN: [id number] Date of Birth: Jun 20, 1941  Subjective/Objective:                  Sepsis  Action/Plan: CM spoke with patient. [facility] selected for home health nursing visits. Mary at [facility] notified of the referral and discharge date of today. Patient reports no other discharge needs.  Expected Discharge Date:  04/07/16               Expected Discharge Plan:  [place]  In-House Referral:     Discharge planning Services  CM Consult  Post Acute Care Choice:  Home Health Choice offered to:  Patient  DME Arranged:  N/A DME Agency:  NA  HH Arranged:  RN [place] Agency:  [facility]  Status of Service:  Completed, signed off  Medicare Important Message Given:  Yes Date Medicare IM Given:    Medicare IM give by:    Date Additional Medicare IM Given:    Additional Medicare Important Message give by:     If discussed at [place] of Stay Meetings, dates discussed:    Additional Comments:  Apolonio Schneiders, RN 04/07/2016, 11:36 AM

## 2016-04-07 NOTE — Progress Notes (Signed)
ANTICOAGULATION CONSULT NOTE - Follow-Up  Pharmacy Consult for warfarin/heparin Indication: atrial fibrillation  No Known Allergies  Patient Measurements: Height: 5\' 4"  (162.6 cm) Weight: 162 lb 11.2 oz (73.8 kg) IBW/kg (Calculated) : 54.7 Heparin Dosing Weight:   Vital Signs: Temp: 98.1 F (36.7 C) (05/21 0519) Temp Source: Oral (05/21 0519) BP: 130/60 mmHg (05/21 0519) Pulse Rate: 77 (05/21 0519)  Labs:  Recent Labs  04/05/16 0457  04/06/16 0421 04/06/16 1140 04/06/16 2059 04/07/16 0525 04/07/16 0526  HGB 9.9*  --  10.4*  --   --  10.0*  --   HCT 30.5*  --  31.6*  --   --  30.9*  --   PLT 180  --  234  --   --  275  --   LABPROT  --   --  13.6  --   --  13.8  --   INR  --   --  1.06  --   --  1.08  --   HEPARINUNFRC  --   < > 0.32 0.23* 0.32  --  0.39  CREATININE 1.35*  --  1.14*  --   --  1.35*  --   < > = values in this interval not displayed.  Estimated Creatinine Clearance: 34.3 mL/min (by C-G formula based on Cr of 1.35).  Medical History: Past Medical History  Diagnosis Date  . Diabetes mellitus without complication ([place])   . Arthritis   . Hyperlipidemia    Medications:  Prescriptions prior to admission  Medication Sig Dispense Refill Last Dose  . rosuvastatin (CRESTOR) 20 MG tablet Take 20 mg by mouth daily.   03/30/2016  . spironolactone-hydrochlorothiazide (ALDACTAZIDE) 25-25 MG tablet Take 1 tablet by mouth daily.  3 03/30/2016  . fluticasone (FLONASE) 50 MCG/ACT nasal spray Place 2 sprays into the nose daily. (Patient not taking: Reported on 04/02/2016) 16 g 6 Not Taking at Unknown time   Assessment: Pharmacy is consulted to dose heparin and warfarin in 75 yo female with newly diagnosed with a.fib with RVR. Pt was noted not to be on any anticoagulation for other indications PTA. Chads2Vasc score is 5.   Baseline labs:  INR 1.13, PT 14.7 ( 5/16),   Hgb 9.9, plt 180 ( 5/19)  LFT, albumin WNL  SCr elevated on admission, trending  down  04/07/2016  Hgb, low but stable, Plt WNL  Heparin level with AM labs was 0.39  INR 1.06, subtherapeutic. Has not responded much to doses of 5 mg  On carb modified diet  No bleeding issues per RN  Goal of Therapy:  INR 2-3  Anti-Xa level of 0.3-0.7 Monitor platelets by anticoagulation protocol: Yes   Plan:   Continue heparin infusion at 1150 units/hr  Warfarin 10 mg PO x1   Daily PT/INR, Daily CBC   Monitor for signs and symptoms of bleeding  Royetta Asal, PharmD, BCPS Pager [phone] 04/07/2016 10:26 AM

## 2016-04-07 NOTE — Progress Notes (Signed)
Patient discharged home with husband, discharge instructions given and explained to patient/husband and they verbalized understanding denies any pain/distress, accompanied home by husband. No wound noted, skin intact. Reviewed Lovenox injection with patient/husband and husband demonstrated understanding by giving the injection and nephrostomy tube care reviewed with patient/husband and they verbalized understanding.

## 2016-04-07 NOTE — Progress Notes (Signed)
ANTICOAGULATION CONSULT NOTE - Follow-Up  Pharmacy Consult for Enoxaparin/warfarin Indication: atrial fibrillation  No Known Allergies  Patient Measurements: Height: 5\' 4"  (162.6 cm) Weight: 162 lb 11.2 oz (73.8 kg) IBW/kg (Calculated) : 54.7 Heparin Dosing Weight:   Vital Signs: Temp: 98.1 F (36.7 C) (05/21 0519) Temp Source: Oral (05/21 0519) BP: 130/60 mmHg (05/21 0519) Pulse Rate: 77 (05/21 0519)  Labs:  Recent Labs  04/05/16 0457  04/06/16 0421 04/06/16 1140 04/06/16 2059 04/07/16 0525 04/07/16 0526  HGB 9.9*  --  10.4*  --   --  10.0*  --   HCT 30.5*  --  31.6*  --   --  30.9*  --   PLT 180  --  234  --   --  275  --   LABPROT  --   --  13.6  --   --  13.8  --   INR  --   --  1.06  --   --  1.08  --   HEPARINUNFRC  --   < > 0.32 0.23* 0.32  --  0.39  CREATININE 1.35*  --  1.14*  --   --  1.35*  --   < > = values in this interval not displayed.  Estimated Creatinine Clearance: 34.3 mL/min (by C-G formula based on Cr of 1.35).  Medical History: Past Medical History  Diagnosis Date  . Diabetes mellitus without complication ([place])   . Arthritis   . Hyperlipidemia    Medications:  Prescriptions prior to admission  Medication Sig Dispense Refill Last Dose  . rosuvastatin (CRESTOR) 20 MG tablet Take 20 mg by mouth daily.   03/30/2016  . spironolactone-hydrochlorothiazide (ALDACTAZIDE) 25-25 MG tablet Take 1 tablet by mouth daily.  3 03/30/2016  . fluticasone (FLONASE) 50 MCG/ACT nasal spray Place 2 sprays into the nose daily. (Patient not taking: Reported on 04/02/2016) 16 g 6 Not Taking at Unknown time   Assessment: Pharmacy is consulted to dose heparin and warfarin in 75 yo female with newly diagnosed with a.fib with RVR. Pt was noted not to be on any anticoagulation for other indications PTA. Chads2Vasc score is 5.   On 5/21 Pt is being transitioned from heparin to enoxaparin.  Baseline labs:  INR 1.13, PT 14.7 ( 5/16),   Hgb 9.9, plt 180 ( 5/19)  LFT,  albumin WNL  SCr elevated on admission, trending down  04/07/2016  Hgb, low but stable, Plt WNL  INR 1.06, subtherapeutic. Has not responded much to doses of 5 mg  On carb modified diet  No bleeding issues per RN  Bump in SCr today up to 1.35  Goal of Therapy:  INR 2-3  Anti-Xa level of 0.3-0.7 Monitor platelets by anticoagulation protocol: Yes   Plan:   Enoxaparin 110 mg [place] once daily  Warfarin 10 mg PO x1   Follow renal function closely. Advised that dose may have to be adjusted if pt renal function declines further.   Monitor for signs and symptoms of bleeding  Royetta Asal, PharmD, BCPS Pager [phone] 04/07/2016 11:51 AM

## 2016-04-08 ENCOUNTER — Ambulatory Visit (INDEPENDENT_AMBULATORY_CARE_PROVIDER_SITE_OTHER): Payer: Medicare Other | Admitting: Cardiology

## 2016-04-08 ENCOUNTER — Telehealth: Payer: Self-pay | Admitting: Cardiovascular Disease

## 2016-04-08 DIAGNOSIS — E785 Hyperlipidemia, unspecified: Secondary | ICD-10-CM | POA: Diagnosis not present

## 2016-04-08 DIAGNOSIS — N201 Calculus of ureter: Secondary | ICD-10-CM | POA: Diagnosis not present

## 2016-04-08 DIAGNOSIS — N10 Acute tubulo-interstitial nephritis: Secondary | ICD-10-CM | POA: Diagnosis not present

## 2016-04-08 DIAGNOSIS — I10 Essential (primary) hypertension: Secondary | ICD-10-CM | POA: Diagnosis not present

## 2016-04-08 DIAGNOSIS — I482 Chronic atrial fibrillation: Secondary | ICD-10-CM | POA: Diagnosis not present

## 2016-04-08 DIAGNOSIS — E119 Type 2 diabetes mellitus without complications: Secondary | ICD-10-CM | POA: Diagnosis not present

## 2016-04-08 DIAGNOSIS — Z5181 Encounter for therapeutic drug level monitoring: Secondary | ICD-10-CM | POA: Diagnosis not present

## 2016-04-08 DIAGNOSIS — N136 Pyonephrosis: Secondary | ICD-10-CM | POA: Diagnosis not present

## 2016-04-08 DIAGNOSIS — I4891 Unspecified atrial fibrillation: Secondary | ICD-10-CM

## 2016-04-08 DIAGNOSIS — M1991 Primary osteoarthritis, unspecified site: Secondary | ICD-10-CM | POA: Diagnosis not present

## 2016-04-08 DIAGNOSIS — Z792 Long term (current) use of antibiotics: Secondary | ICD-10-CM | POA: Diagnosis not present

## 2016-04-08 DIAGNOSIS — Z7901 Long term (current) use of anticoagulants: Secondary | ICD-10-CM | POA: Diagnosis not present

## 2016-04-08 DIAGNOSIS — Z436 Encounter for attention to other artificial openings of urinary tract: Secondary | ICD-10-CM | POA: Diagnosis not present

## 2016-04-08 LAB — POCT INR: INR: 1.2

## 2016-04-08 NOTE — Telephone Encounter (Signed)
New message      The home care nurse calling a INR result 1.2 today. The pt is complaining of headache, the home care nurse just took a blood pressure reading it was 170/90   Pt c/o BP issue: STAT if pt c/o blurred vision, one-sided weakness or slurred speech  1. What are your last 5 BP readings? Taken at 1330 it was 170/90  2. Are you having any other symptoms (ex. Dizziness, headache, blurred vision, passed out)? Just a headache  3. What is your BP issue? Pt was complaining of having a headache on and off all weekend as stated by home health nurse

## 2016-04-08 NOTE — Telephone Encounter (Signed)
Left message that Romie Minus with Home Health needs to follow-up with patient's PCP. If she has any questions to please call back.

## 2016-04-09 DIAGNOSIS — N201 Calculus of ureter: Secondary | ICD-10-CM | POA: Diagnosis not present

## 2016-04-09 DIAGNOSIS — Z792 Long term (current) use of antibiotics: Secondary | ICD-10-CM | POA: Diagnosis not present

## 2016-04-09 DIAGNOSIS — I10 Essential (primary) hypertension: Secondary | ICD-10-CM | POA: Diagnosis not present

## 2016-04-09 DIAGNOSIS — N136 Pyonephrosis: Secondary | ICD-10-CM | POA: Diagnosis not present

## 2016-04-09 DIAGNOSIS — E119 Type 2 diabetes mellitus without complications: Secondary | ICD-10-CM | POA: Diagnosis not present

## 2016-04-09 DIAGNOSIS — N10 Acute tubulo-interstitial nephritis: Secondary | ICD-10-CM | POA: Diagnosis not present

## 2016-04-09 DIAGNOSIS — Z7901 Long term (current) use of anticoagulants: Secondary | ICD-10-CM | POA: Diagnosis not present

## 2016-04-09 DIAGNOSIS — I482 Chronic atrial fibrillation: Secondary | ICD-10-CM | POA: Diagnosis not present

## 2016-04-09 DIAGNOSIS — M1991 Primary osteoarthritis, unspecified site: Secondary | ICD-10-CM | POA: Diagnosis not present

## 2016-04-09 DIAGNOSIS — Z5181 Encounter for therapeutic drug level monitoring: Secondary | ICD-10-CM | POA: Diagnosis not present

## 2016-04-09 DIAGNOSIS — Z436 Encounter for attention to other artificial openings of urinary tract: Secondary | ICD-10-CM | POA: Diagnosis not present

## 2016-04-09 DIAGNOSIS — E785 Hyperlipidemia, unspecified: Secondary | ICD-10-CM | POA: Diagnosis not present

## 2016-04-09 LAB — CULTURE, BLOOD (ROUTINE X 2)
CULTURE: NO GROWTH
Culture: NO GROWTH

## 2016-04-11 ENCOUNTER — Ambulatory Visit (INDEPENDENT_AMBULATORY_CARE_PROVIDER_SITE_OTHER): Payer: Medicare Other | Admitting: Cardiovascular Disease

## 2016-04-11 DIAGNOSIS — M1991 Primary osteoarthritis, unspecified site: Secondary | ICD-10-CM | POA: Diagnosis not present

## 2016-04-11 DIAGNOSIS — N10 Acute tubulo-interstitial nephritis: Secondary | ICD-10-CM | POA: Diagnosis not present

## 2016-04-11 DIAGNOSIS — Z792 Long term (current) use of antibiotics: Secondary | ICD-10-CM | POA: Diagnosis not present

## 2016-04-11 DIAGNOSIS — Z436 Encounter for attention to other artificial openings of urinary tract: Secondary | ICD-10-CM | POA: Diagnosis not present

## 2016-04-11 DIAGNOSIS — Z5181 Encounter for therapeutic drug level monitoring: Secondary | ICD-10-CM | POA: Diagnosis not present

## 2016-04-11 DIAGNOSIS — I10 Essential (primary) hypertension: Secondary | ICD-10-CM | POA: Diagnosis not present

## 2016-04-11 DIAGNOSIS — E119 Type 2 diabetes mellitus without complications: Secondary | ICD-10-CM | POA: Diagnosis not present

## 2016-04-11 DIAGNOSIS — I4891 Unspecified atrial fibrillation: Secondary | ICD-10-CM

## 2016-04-11 DIAGNOSIS — Z7901 Long term (current) use of anticoagulants: Secondary | ICD-10-CM | POA: Diagnosis not present

## 2016-04-11 DIAGNOSIS — N201 Calculus of ureter: Secondary | ICD-10-CM | POA: Diagnosis not present

## 2016-04-11 DIAGNOSIS — E785 Hyperlipidemia, unspecified: Secondary | ICD-10-CM | POA: Diagnosis not present

## 2016-04-11 DIAGNOSIS — I482 Chronic atrial fibrillation: Secondary | ICD-10-CM | POA: Diagnosis not present

## 2016-04-11 DIAGNOSIS — N136 Pyonephrosis: Secondary | ICD-10-CM | POA: Diagnosis not present

## 2016-04-11 LAB — POCT INR: INR: 1.9

## 2016-04-12 ENCOUNTER — Encounter: Payer: Medicare Other | Admitting: Nurse Practitioner

## 2016-04-12 DIAGNOSIS — I4891 Unspecified atrial fibrillation: Secondary | ICD-10-CM | POA: Diagnosis not present

## 2016-04-12 DIAGNOSIS — E0811 Diabetes mellitus due to underlying condition with ketoacidosis with coma: Secondary | ICD-10-CM | POA: Diagnosis not present

## 2016-04-12 DIAGNOSIS — E782 Mixed hyperlipidemia: Secondary | ICD-10-CM | POA: Diagnosis not present

## 2016-04-12 DIAGNOSIS — I10 Essential (primary) hypertension: Secondary | ICD-10-CM | POA: Diagnosis not present

## 2016-04-13 DIAGNOSIS — Z7901 Long term (current) use of anticoagulants: Secondary | ICD-10-CM | POA: Diagnosis not present

## 2016-04-13 DIAGNOSIS — E119 Type 2 diabetes mellitus without complications: Secondary | ICD-10-CM | POA: Diagnosis not present

## 2016-04-13 DIAGNOSIS — E785 Hyperlipidemia, unspecified: Secondary | ICD-10-CM | POA: Diagnosis not present

## 2016-04-13 DIAGNOSIS — I10 Essential (primary) hypertension: Secondary | ICD-10-CM | POA: Diagnosis not present

## 2016-04-13 DIAGNOSIS — Z792 Long term (current) use of antibiotics: Secondary | ICD-10-CM | POA: Diagnosis not present

## 2016-04-13 DIAGNOSIS — Z5181 Encounter for therapeutic drug level monitoring: Secondary | ICD-10-CM | POA: Diagnosis not present

## 2016-04-13 DIAGNOSIS — N10 Acute tubulo-interstitial nephritis: Secondary | ICD-10-CM | POA: Diagnosis not present

## 2016-04-13 DIAGNOSIS — M1991 Primary osteoarthritis, unspecified site: Secondary | ICD-10-CM | POA: Diagnosis not present

## 2016-04-13 DIAGNOSIS — N201 Calculus of ureter: Secondary | ICD-10-CM | POA: Diagnosis not present

## 2016-04-13 DIAGNOSIS — N136 Pyonephrosis: Secondary | ICD-10-CM | POA: Diagnosis not present

## 2016-04-13 DIAGNOSIS — I482 Chronic atrial fibrillation: Secondary | ICD-10-CM | POA: Diagnosis not present

## 2016-04-13 DIAGNOSIS — Z436 Encounter for attention to other artificial openings of urinary tract: Secondary | ICD-10-CM | POA: Diagnosis not present

## 2016-04-17 ENCOUNTER — Ambulatory Visit (INDEPENDENT_AMBULATORY_CARE_PROVIDER_SITE_OTHER): Payer: Medicare Other | Admitting: Internal Medicine

## 2016-04-17 DIAGNOSIS — E119 Type 2 diabetes mellitus without complications: Secondary | ICD-10-CM | POA: Diagnosis not present

## 2016-04-17 DIAGNOSIS — I482 Chronic atrial fibrillation: Secondary | ICD-10-CM | POA: Diagnosis not present

## 2016-04-17 DIAGNOSIS — I10 Essential (primary) hypertension: Secondary | ICD-10-CM | POA: Diagnosis not present

## 2016-04-17 DIAGNOSIS — Z7901 Long term (current) use of anticoagulants: Secondary | ICD-10-CM | POA: Diagnosis not present

## 2016-04-17 DIAGNOSIS — I4891 Unspecified atrial fibrillation: Secondary | ICD-10-CM

## 2016-04-17 DIAGNOSIS — N136 Pyonephrosis: Secondary | ICD-10-CM | POA: Diagnosis not present

## 2016-04-17 DIAGNOSIS — Z792 Long term (current) use of antibiotics: Secondary | ICD-10-CM | POA: Diagnosis not present

## 2016-04-17 DIAGNOSIS — N201 Calculus of ureter: Secondary | ICD-10-CM | POA: Diagnosis not present

## 2016-04-17 DIAGNOSIS — N10 Acute tubulo-interstitial nephritis: Secondary | ICD-10-CM | POA: Diagnosis not present

## 2016-04-17 DIAGNOSIS — E785 Hyperlipidemia, unspecified: Secondary | ICD-10-CM | POA: Diagnosis not present

## 2016-04-17 DIAGNOSIS — Z436 Encounter for attention to other artificial openings of urinary tract: Secondary | ICD-10-CM | POA: Diagnosis not present

## 2016-04-17 DIAGNOSIS — M1991 Primary osteoarthritis, unspecified site: Secondary | ICD-10-CM | POA: Diagnosis not present

## 2016-04-17 DIAGNOSIS — Z5181 Encounter for therapeutic drug level monitoring: Secondary | ICD-10-CM | POA: Diagnosis not present

## 2016-04-17 LAB — POCT INR: INR: 2.1

## 2016-04-18 ENCOUNTER — Ambulatory Visit (INDEPENDENT_AMBULATORY_CARE_PROVIDER_SITE_OTHER): Payer: Medicare Other | Admitting: Physician Assistant

## 2016-04-18 ENCOUNTER — Encounter: Payer: Self-pay | Admitting: Physician Assistant

## 2016-04-18 VITALS — BP 142/84 | HR 72 | Ht 64.0 in | Wt 160.8 lb

## 2016-04-18 DIAGNOSIS — I10 Essential (primary) hypertension: Secondary | ICD-10-CM | POA: Diagnosis not present

## 2016-04-18 DIAGNOSIS — I4891 Unspecified atrial fibrillation: Secondary | ICD-10-CM | POA: Diagnosis not present

## 2016-04-18 DIAGNOSIS — E785 Hyperlipidemia, unspecified: Secondary | ICD-10-CM | POA: Diagnosis not present

## 2016-04-18 DIAGNOSIS — E118 Type 2 diabetes mellitus with unspecified complications: Secondary | ICD-10-CM | POA: Diagnosis not present

## 2016-04-18 LAB — CBC
HCT: 33.1 % — ABNORMAL LOW (ref 35.0–45.0)
HEMOGLOBIN: 10.8 g/dL — AB (ref 11.7–15.5)
MCH: 27.2 pg (ref 27.0–33.0)
MCHC: 32.6 g/dL (ref 32.0–36.0)
MCV: 83.4 fL (ref 80.0–100.0)
MPV: 10.2 fL (ref 7.5–12.5)
PLATELETS: 447 10*3/uL — AB (ref 140–400)
RBC: 3.97 MIL/uL (ref 3.80–5.10)
RDW: 15 % (ref 11.0–15.0)
WBC: 7.4 10*3/uL (ref 3.8–10.8)

## 2016-04-18 MED ORDER — WARFARIN SODIUM 7.5 MG PO TABS: 7.5000 mg | ORAL_TABLET | Freq: Every day | ORAL | Status: DC

## 2016-04-18 MED ORDER — DILTIAZEM HCL ER COATED BEADS 240 MG PO CP24: 240.0000 mg | ORAL_CAPSULE | Freq: Every day | ORAL | Status: DC

## 2016-04-18 NOTE — Progress Notes (Signed)
CARDIOLOGY OFFICE NOTE  Date:  04/18/2016    Audrey Mullen Date of Birth: Dec 15, 1940 Medical Record [id number]  PCP:  Elyn Peers, MD  Cardiologist:  Dr. Johnsie Cancel  Chief Complaint  Patient presents with  . Hospitalization Follow-up    seen for Dr. Johnsie Cancel    History of Present Illness: ELIYANNA Mullen is a 75 y.o. female who presents today for post hospital follow-up. Patient has history of HTN, HLD, DM, nephrolithiasis, no prior cardiac history who was recently admitted at [facility] from 5/16-5/21 with sepsis secondary to acute pyelonephritis and obstructive uropathy. She was also found to be afib with RVR and elevated troponin. He had elevated lactic acid level, elevated white blood cell count on arrival. CTA scan of abdomen and pelvis revealed moderate left hydronephrosis secondary to UPJ obstruction from stone. He underwent nephrostomy tube placement on 04/02/2016. 2-D echocardiogram obtained on 04/03/2016 showed mild focal basal hypertrophy of the septum, EF 55-60%, no regional wall motion abnormalities. Due to elevated creatinine and low GFR, patient is not a candidate for NOAC. She was started on heparin with coumadin bridge.   He presents today for follow-up. She still have drain in place, and is eager to take them out. She has upcoming urology follow-up. She has otherwise been feeling very well since discharge. She denies any chest discomfort, shortness of breath, lower extremity edema, fever or chill. She has been compliant with her medication. Based on today's EKG, it appears patient has been converted to normal sinus rhythm since discharge. She says her blood pressure has been very well controlled at home, today's blood pressure is 142/84 which according to the patient is higher than her home blood pressure. I will not increase the diltiazem at this point. She will continue on Coumadin. She has been followed by the Coumadin clinic, and her INR has been  therapeutic since 5/31.     Past Medical History  Diagnosis Date  . Diabetes mellitus without complication ([place])   . Arthritis   . Hyperlipidemia     Past Surgical History  Procedure Laterality Date  . Rotator cuff repair       Medications: Current Outpatient Prescriptions  Medication Sig Dispense Refill  . cephALEXin (KEFLEX) 750 MG capsule Take 1 capsule (750 mg total) by mouth 3 (three) times daily. 27 capsule 0  . diltiazem (CARDIZEM CD) 240 MG 24 hr capsule Take 1 capsule (240 mg total) by mouth daily. 90 capsule 3  . glipiZIDE (GLUCOTROL) 5 MG tablet Take 0.5 tablets (2.5 mg total) by mouth daily before breakfast. 30 tablet 0  . nystatin (MYCOSTATIN) 100000 UNIT/ML suspension Take 5 mLs (500,000 Units total) by mouth 4 (four) times daily. 60 mL 0  . rosuvastatin (CRESTOR) 20 MG tablet Take 20 mg by mouth daily.    Marland Kitchen warfarin (COUMADIN) 7.5 MG tablet Take 1 tablet (7.5 mg total) by mouth daily at 6 PM. 30 tablet 1   No current facility-administered medications for this visit.    Allergies: No Known Allergies  Social History: The patient  reports that she has never smoked. She does not have any smokeless tobacco history on file. She reports that she does not drink alcohol or use illicit drugs.   Family History: The patient's family history is negative for CAD.   Review of Systems: Please see the history of present illness.   Otherwise, the review of systems is positive for none.   All other systems are reviewed and negative.  Physical Exam: VS:  BP 142/84 mmHg  Pulse 72  Ht 5\' 4"  (1.626 m)  Wt 160 lb 12.8 oz (72.938 kg)  BMI 27.59 kg/m2 .  BMI Body mass index is 27.59 kg/(m^2).  Wt Readings from Last 3 Encounters:  04/18/16 160 lb 12.8 oz (72.938 kg)  04/02/16 162 lb 11.2 oz (73.8 kg)  08/13/13 161 lb 9.6 oz (73.301 kg)    General: Pleasant. Well developed, well nourished and in no acute distress.  HEENT: Normal. Neck: Supple, no JVD, carotid bruits, or  masses noted.  Cardiac: Regular rate and rhythm. No murmurs, rubs, or gallops. No edema.  Respiratory:  Lungs are clear to auscultation bilaterally with normal work of breathing.  GI: Soft and nontender.  MS: No deformity or atrophy. Gait and ROM intact. Skin: Warm and dry. Color is normal.  Neuro:  Strength and sensation are intact and no gross focal deficits noted.  Psych: Alert, appropriate and with normal affect.   LABORATORY DATA:  EKG:  EKG is ordered today. This demonstrates NSR without significant ST-T wave changes.  Lab Results  Component Value Date   WBC 9.6 04/07/2016   HGB 10.0* 04/07/2016   HCT 30.9* 04/07/2016   PLT 275 04/07/2016   GLUCOSE 143* 04/07/2016   CHOL 178 04/04/2016   TRIG 237* 04/04/2016   HDL 24* 04/04/2016   LDLCALC 107* 04/04/2016   ALT 19 04/02/2016   AST 36 04/02/2016   NA 138 04/07/2016   K 3.8 04/07/2016   CL 108 04/07/2016   CREATININE 1.35* 04/07/2016   BUN 14 04/07/2016   CO2 23 04/07/2016   TSH 0.352 04/02/2016   INR 2.1 04/17/2016   HGBA1C 7.5* 04/03/2016    Other Studies Reviewed Today:  Echo 04/03/2016 LV EF: 55% - 60%  ------------------------------------------------------------------- Indications: Atrial fibrillation - 427.31.  ------------------------------------------------------------------- History: PMH: Kidney Stones- Sepsis. Elevated Troponin&'s. Risk factors: Diabetes mellitus. Dyslipidemia.  ------------------------------------------------------------------- Study Conclusions  - Left ventricle: The cavity size was normal. There was mild focal  basal hypertrophy of the septum. Systolic function was normal.  The estimated ejection fraction was in the range of 55% to 60%.  Wall motion was normal; there were no regional wall motion  abnormalities. Left ventricular diastolic function parameters  were normal. - Aortic valve: Trileaflet; moderately thickened, moderately  calcified leaflets. -  Left atrium: The atrium was mildly dilated.   Perc drain 04/02/2016  Pre-procedure Diagnoses   1. Acute pyonephrosis [N13.6]       Expand All Collapse All   Successful LT PCN INSERTION NO COMP STABLE PURULENT URINE ASPIRATED GS/CX SENT FULL REPORT IN PACS         Assessment/Plan:  1. PAF on coumadin - now in NSR  - CHA2DS2-Vasc 5 (HTN, age, F sex, DM). Continue coumadin  - she has converted to NSR since discharge. She says her BP is normal at home, I will continue diltiazem CD 240mg  at this time  - Ideally pt should continue coumadin for at least 1 month after conversion, in this case more chronic, but she may stop coumadin if needed if require upcoming surgery. However post surgery, she should be restarted on coumadin as soon as deemed ok by surgeon. Will check CBC  2. Acute pyelonephritis with obstructive uropathy s/p perc drain catheter 5/16: follow by Urology  3. Acute renal insufficiency 2/2 post renal obstruction: recheck BMET today  4. HTN: BP elevated today, however per patient her BP is better at home. Will hold off on  uptitrating diltiazem unless BP still high on followup  5. HLD: on crestor  6. DM2: on glipizide   Current medicines are reviewed with the patient today.  The patient does not have concerns regarding medicines other than what has been noted above.  The following changes have been made:  See above.  Labs/ tests ordered today include:    Orders Placed This Encounter  Procedures  . Basic Metabolic Panel (BMET)  . CBC  . EKG 12-Lead     Disposition:   FU with Dr. Johnsie Cancel in 3 months.   Patient is agreeable to this plan and will call if any problems develop in the interim.   Signed: Almyra Deforest PA-C 04/18/2016 4:55 PM  [facility] [address] [place], [place]  [address] Phone: [phone] Fax: [phone]

## 2016-04-18 NOTE — Patient Instructions (Signed)
Medication Instructions:  Your physician recommends that you continue on your current medications as directed. Please refer to the Current Medication list given to you today.   Labwork: TODAY;  BMET & CBC  Testing/Procedures: None ordered  Follow-Up: Your physician recommends that you schedule a follow-up appointment in: 2-3 MONTHS WITH DR. Johnsie Cancel   Any Other Special Instructions Will Be Listed Below (If Applicable).     If you need a refill on your cardiac medications before your next appointment, please call your pharmacy.

## 2016-04-19 ENCOUNTER — Telehealth: Payer: Self-pay | Admitting: *Deleted

## 2016-04-19 DIAGNOSIS — N179 Acute kidney failure, unspecified: Secondary | ICD-10-CM

## 2016-04-19 LAB — BASIC METABOLIC PANEL
BUN: 17 mg/dL (ref 7–25)
CALCIUM: 9.3 mg/dL (ref 8.6–10.4)
CO2: 24 mmol/L (ref 20–31)
CREATININE: 1.43 mg/dL — AB (ref 0.60–0.93)
Chloride: 104 mmol/L (ref 98–110)
Glucose, Bld: 98 mg/dL (ref 65–99)
Potassium: 4.5 mmol/L (ref 3.5–5.3)
SODIUM: 140 mmol/L (ref 135–146)

## 2016-04-19 NOTE — Telephone Encounter (Signed)
Pt aware of her lab results. She will come in and have BMET 05/03/16. Orders put in EPIC. Pt verbalized understanding.

## 2016-04-19 NOTE — Telephone Encounter (Signed)
-----   Message from [place], [place] sent at 04/19/2016 12:50 PM EDT ----- Kidney function worsened slightly, will need to continue to observe with repeat lab in 2 weeks. Red blood cell stable on blood thinner. Continue current therapy

## 2016-04-24 ENCOUNTER — Ambulatory Visit (INDEPENDENT_AMBULATORY_CARE_PROVIDER_SITE_OTHER): Payer: Medicare Other | Admitting: Cardiology

## 2016-04-24 DIAGNOSIS — M1991 Primary osteoarthritis, unspecified site: Secondary | ICD-10-CM | POA: Diagnosis not present

## 2016-04-24 DIAGNOSIS — E119 Type 2 diabetes mellitus without complications: Secondary | ICD-10-CM | POA: Diagnosis not present

## 2016-04-24 DIAGNOSIS — Z7901 Long term (current) use of anticoagulants: Secondary | ICD-10-CM | POA: Diagnosis not present

## 2016-04-24 DIAGNOSIS — Z5181 Encounter for therapeutic drug level monitoring: Secondary | ICD-10-CM | POA: Diagnosis not present

## 2016-04-24 DIAGNOSIS — N201 Calculus of ureter: Secondary | ICD-10-CM | POA: Diagnosis not present

## 2016-04-24 DIAGNOSIS — Z792 Long term (current) use of antibiotics: Secondary | ICD-10-CM | POA: Diagnosis not present

## 2016-04-24 DIAGNOSIS — I482 Chronic atrial fibrillation: Secondary | ICD-10-CM | POA: Diagnosis not present

## 2016-04-24 DIAGNOSIS — I4891 Unspecified atrial fibrillation: Secondary | ICD-10-CM

## 2016-04-24 DIAGNOSIS — N10 Acute tubulo-interstitial nephritis: Secondary | ICD-10-CM | POA: Diagnosis not present

## 2016-04-24 DIAGNOSIS — I10 Essential (primary) hypertension: Secondary | ICD-10-CM | POA: Diagnosis not present

## 2016-04-24 DIAGNOSIS — E785 Hyperlipidemia, unspecified: Secondary | ICD-10-CM | POA: Diagnosis not present

## 2016-04-24 DIAGNOSIS — N136 Pyonephrosis: Secondary | ICD-10-CM | POA: Diagnosis not present

## 2016-04-24 DIAGNOSIS — Z436 Encounter for attention to other artificial openings of urinary tract: Secondary | ICD-10-CM | POA: Diagnosis not present

## 2016-04-24 LAB — POCT INR: INR: 3.8

## 2016-04-25 ENCOUNTER — Telehealth: Payer: Self-pay

## 2016-04-25 NOTE — Telephone Encounter (Signed)
Scheduled patient for 07/19/16 with Dr. Johnsie Cancel.

## 2016-04-25 NOTE — Telephone Encounter (Signed)
Left message for patient to call back to schedule 3 month appointment.

## 2016-04-25 NOTE — Telephone Encounter (Signed)
Pt called back-no appts left in Sept-Oct not out yet!

## 2016-04-26 ENCOUNTER — Telehealth: Payer: Self-pay | Admitting: Cardiovascular Disease

## 2016-04-26 DIAGNOSIS — N200 Calculus of kidney: Secondary | ICD-10-CM | POA: Diagnosis not present

## 2016-04-26 DIAGNOSIS — N132 Hydronephrosis with renal and ureteral calculous obstruction: Secondary | ICD-10-CM | POA: Diagnosis not present

## 2016-04-26 DIAGNOSIS — N136 Pyonephrosis: Secondary | ICD-10-CM | POA: Diagnosis not present

## 2016-04-26 NOTE — Telephone Encounter (Signed)
New message      Request for surgical clearance:  1. What type of surgery is being performed? Lipotripsy (kidney stones removed)  2. When is this surgery scheduled? pending  Are there any medications that need to be held prior to surgery and how long? How long to be off the coumadin  3. Name of physician performing surgery? Dr. Tannenbaum(neurologyist)  4. What is your office phone and fax number? Office number [phone] ext:[phone]/fax number [phone]

## 2016-04-28 NOTE — Telephone Encounter (Signed)
[place] for lithotripsy can stop coumadin 4 days before if needed

## 2016-04-29 ENCOUNTER — Other Ambulatory Visit: Payer: Self-pay | Admitting: Urology

## 2016-04-29 ENCOUNTER — Encounter (HOSPITAL_COMMUNITY): Payer: Self-pay | Admitting: *Deleted

## 2016-04-29 NOTE — Telephone Encounter (Signed)
Will fax Dr. Kyla Balzarine response to Dr. Gaynelle Arabian.

## 2016-05-01 DIAGNOSIS — I482 Chronic atrial fibrillation: Secondary | ICD-10-CM | POA: Diagnosis not present

## 2016-05-01 DIAGNOSIS — N136 Pyonephrosis: Secondary | ICD-10-CM | POA: Diagnosis not present

## 2016-05-01 DIAGNOSIS — E119 Type 2 diabetes mellitus without complications: Secondary | ICD-10-CM | POA: Diagnosis not present

## 2016-05-01 DIAGNOSIS — Z7901 Long term (current) use of anticoagulants: Secondary | ICD-10-CM | POA: Diagnosis not present

## 2016-05-01 DIAGNOSIS — Z5181 Encounter for therapeutic drug level monitoring: Secondary | ICD-10-CM | POA: Diagnosis not present

## 2016-05-01 DIAGNOSIS — M1991 Primary osteoarthritis, unspecified site: Secondary | ICD-10-CM | POA: Diagnosis not present

## 2016-05-01 DIAGNOSIS — I10 Essential (primary) hypertension: Secondary | ICD-10-CM | POA: Diagnosis not present

## 2016-05-01 DIAGNOSIS — Z792 Long term (current) use of antibiotics: Secondary | ICD-10-CM | POA: Diagnosis not present

## 2016-05-01 DIAGNOSIS — Z436 Encounter for attention to other artificial openings of urinary tract: Secondary | ICD-10-CM | POA: Diagnosis not present

## 2016-05-01 DIAGNOSIS — E785 Hyperlipidemia, unspecified: Secondary | ICD-10-CM | POA: Diagnosis not present

## 2016-05-01 DIAGNOSIS — N10 Acute tubulo-interstitial nephritis: Secondary | ICD-10-CM | POA: Diagnosis not present

## 2016-05-01 DIAGNOSIS — N201 Calculus of ureter: Secondary | ICD-10-CM | POA: Diagnosis not present

## 2016-05-02 ENCOUNTER — Telehealth: Payer: Self-pay | Admitting: *Deleted

## 2016-05-02 ENCOUNTER — Ambulatory Visit (HOSPITAL_COMMUNITY)
Admission: RE | Admit: 2016-05-02 | Discharge: 2016-05-02 | Disposition: A | Discharge status: Home / Self Care | Payer: Medicare Other | Source: Ambulatory Visit | Attending: Urology | Admitting: Urology

## 2016-05-02 ENCOUNTER — Encounter (HOSPITAL_COMMUNITY): Payer: Self-pay | Admitting: General Practice

## 2016-05-02 ENCOUNTER — Encounter (HOSPITAL_COMMUNITY)
Admission: RE | Disposition: A | Discharge status: Home / Self Care | Payer: Self-pay | Source: Ambulatory Visit | Attending: Urology

## 2016-05-02 ENCOUNTER — Ambulatory Visit (HOSPITAL_COMMUNITY): Payer: Medicare Other

## 2016-05-02 DIAGNOSIS — I10 Essential (primary) hypertension: Secondary | ICD-10-CM | POA: Insufficient documentation

## 2016-05-02 DIAGNOSIS — N136 Pyonephrosis: Secondary | ICD-10-CM | POA: Diagnosis not present

## 2016-05-02 DIAGNOSIS — N200 Calculus of kidney: Secondary | ICD-10-CM | POA: Diagnosis not present

## 2016-05-02 DIAGNOSIS — M1990 Unspecified osteoarthritis, unspecified site: Secondary | ICD-10-CM | POA: Diagnosis not present

## 2016-05-02 DIAGNOSIS — Z7901 Long term (current) use of anticoagulants: Secondary | ICD-10-CM | POA: Insufficient documentation

## 2016-05-02 DIAGNOSIS — I482 Chronic atrial fibrillation: Secondary | ICD-10-CM | POA: Insufficient documentation

## 2016-05-02 DIAGNOSIS — Z01818 Encounter for other preprocedural examination: Secondary | ICD-10-CM | POA: Diagnosis not present

## 2016-05-02 HISTORY — DX: Cardiac arrhythmia, unspecified: I49.9

## 2016-05-02 HISTORY — DX: Essential (primary) hypertension: I10

## 2016-05-02 LAB — GLUCOSE, CAPILLARY: GLUCOSE-CAPILLARY: 154 mg/dL — AB (ref 65–99)

## 2016-05-02 LAB — PROTIME-INR
INR: 1.35 (ref 0.00–1.49)
Prothrombin Time: 16.3 seconds — ABNORMAL HIGH (ref 11.6–15.2)

## 2016-05-02 SURGERY — LITHOTRIPSY, ESWL
Anesthesia: LOCAL | Laterality: Left

## 2016-05-02 MED ORDER — SODIUM CHLORIDE 0.9 % IV SOLN
INTRAVENOUS | Status: DC
  Administered 2016-05-02: 10:00:00 via INTRAVENOUS

## 2016-05-02 MED ORDER — DIPHENHYDRAMINE HCL 25 MG PO CAPS
25.0000 mg | ORAL_CAPSULE | ORAL | Status: AC
  Administered 2016-05-02: 25 mg via ORAL
  Filled 2016-05-02: qty 1

## 2016-05-02 MED ORDER — DIAZEPAM 5 MG PO TABS
10.0000 mg | ORAL_TABLET | ORAL | Status: AC
  Administered 2016-05-02: 10 mg via ORAL
  Filled 2016-05-02: qty 2

## 2016-05-02 MED ORDER — CIPROFLOXACIN HCL 500 MG PO TABS
500.0000 mg | ORAL_TABLET | ORAL | Status: AC
  Administered 2016-05-02: 500 mg via ORAL
  Filled 2016-05-02: qty 1

## 2016-05-02 NOTE — Discharge Instructions (Signed)
Dietary Guidelines to Help Prevent Kidney Stones Your risk of kidney stones can be decreased by adjusting the foods you eat. The most important thing you can do is drink enough fluid. You should drink enough fluid to keep your urine clear or pale yellow. The following guidelines provide specific information for the type of kidney stone you have had. GUIDELINES ACCORDING TO TYPE OF KIDNEY STONE Calcium Oxalate Kidney Stones  Reduce the amount of salt you eat. Foods that have a lot of salt cause your body to release excess calcium into your urine. The excess calcium can combine with a substance called oxalate to form kidney stones.  Reduce the amount of animal protein you eat if the amount you eat is excessive. Animal protein causes your body to release excess calcium into your urine. Ask your dietitian how much protein from animal sources you should be eating.  Avoid foods that are high in oxalates. If you take vitamins, they should have less than 500 mg of vitamin C. Your body turns vitamin C into oxalates. You do not need to avoid fruits and vegetables high in vitamin C. Calcium Phosphate Kidney Stones  Reduce the amount of salt you eat to help prevent the release of excess calcium into your urine.  Reduce the amount of animal protein you eat if the amount you eat is excessive. Animal protein causes your body to release excess calcium into your urine. Ask your dietitian how much protein from animal sources you should be eating.  Get enough calcium from food or take a calcium supplement (ask your dietitian for recommendations). Food sources of calcium that do not increase your risk of kidney stones include:  Broccoli.  Dairy products, such as cheese and yogurt.  Pudding. Uric Acid Kidney Stones  Do not have more than 6 oz of animal protein per day. FOOD SOURCES Animal Protein Sources  Meat (all types).  Poultry.  Eggs.  Fish, seafood. Foods High in [employer] seasonings.  Soy  sauce.  Teriyaki sauce.  Cured and processed meats.  Salted crackers and snack foods.  Fast food.  Canned soups and most canned foods. Foods High in Oxalates  Grains:  Amaranth.  Barley.  Grits.  Wheat germ.  Bran.  Buckwheat flour.  All bran cereals.  Pretzels.  Whole wheat bread.  Vegetables:  Beans (wax).  Beets and beet greens.  Collard greens.  Eggplant.  [place].  Leeks.  Okra.  Parsley.  Rutabagas.  Spinach.  Swiss chard.  Tomato paste.  Fried potatoes.  Sweet potatoes.  Fruits:  Red currants.  Figs.  Kiwi.  Rhubarb.  Meat and Other Protein Sources:  Beans (dried).  Soy burgers and other soybean products.  Miso.  Nuts (peanuts, almonds, pecans, cashews, hazelnuts).  Nut butters.  Sesame seeds and tahini (paste made of sesame seeds).  Poppy seeds.  Beverages:  Chocolate drink mixes.  Soy milk.  Instant iced tea.  Juices made from high-oxalate fruits or vegetables.  Other:  Carob.  Chocolate.  Fruitcake.  Marmalades.   This information is not intended to replace advice given to you by your health care provider. Make sure you discuss any questions you have with your health care provider.   Document Released: 03/01/2011 Document Revised: 11/09/2013 Document Reviewed: 10/01/2013 Elsevier Interactive Patient Education 2016 Elsevier Inc. Moderate Conscious Sedation, Adult, Care After Refer to this sheet in the next few weeks. These instructions provide you with information on caring for yourself after your procedure. Your health care provider  may also give you more specific instructions. Your treatment has been planned according to current medical practices, but problems sometimes occur. Call your health care provider if you have any problems or questions after your procedure. WHAT TO EXPECT AFTER THE PROCEDURE  After your procedure:  You may feel sleepy, clumsy, and have poor balance for several  hours.  Vomiting may occur if you eat too soon after the procedure. HOME CARE INSTRUCTIONS  Do not participate in any activities where you could become injured for at least 24 hours. Do not:  Drive.  Swim.  Ride a bicycle.  Operate heavy machinery.  Cook.  Use power tools.  Climb ladders.  Work from a high place.  Do not make important decisions or sign legal documents until you are improved.  If you vomit, drink water, juice, or soup when you can drink without vomiting. Make sure you have little or no nausea before eating solid foods.  Only take over-the-counter or prescription medicines for pain, discomfort, or fever as directed by your health care provider.  Make sure you and your family fully understand everything about the medicines given to you, including what side effects may occur.  You should not drink alcohol, take sleeping pills, or take medicines that cause drowsiness for at least 24 hours.  If you smoke, do not smoke without supervision.  If you are feeling better, you may resume normal activities 24 hours after you were sedated.  Keep all appointments with your health care provider. SEEK MEDICAL CARE IF:  Your skin is pale or bluish in color.  You continue to feel nauseous or vomit.  Your pain is getting worse and is not helped by medicine.  You have bleeding or swelling.  You are still sleepy or feeling clumsy after 24 hours. SEEK IMMEDIATE MEDICAL CARE IF:  You develop a rash.  You have difficulty breathing.  You develop any type of allergic problem.  You have a fever. MAKE SURE YOU:  Understand these instructions.  Will watch your condition.  Will get help right away if you are not doing well or get worse.   This information is not intended to replace advice given to you by your health care provider. Make sure you discuss any questions you have with your health care provider.   Document Released: 08/25/2013 Document Revised:  11/25/2014 Document Reviewed: 08/25/2013 Elsevier Interactive Patient Education [employer].

## 2016-05-02 NOTE — Progress Notes (Addendum)
Patient returns from mobile lithotripsy unit. Left flank with nephrostomy tube covered with tegaderm. Skin where ESWL was done is mottled red/pink in color. Partial thickness breakdown about 3cm x 2 cm left flank . This is covered by the tegaderm covering nephrostomy tube.  [place] with Coumadin clinic, [facility] PharmD. Patient is to restart coumadin tonight at [facility]

## 2016-05-02 NOTE — Telephone Encounter (Signed)
Called [facility] because pt is overdue for her INR check.  After speaking with Loma Sousa at [facility] & being hung up on twice, called back & spoke with Theora Gianotti RN and inquired about the pt's INR results.  Theora Gianotti states that the pt had a visit by the [facility] RN but she did not see any results.  According, to the electronic chart the pt was instructed by Dr. Johnsie Cancel to hold Coumadin for 4 days prior to a lithotripsy procedure on 05/02/16.  The pt had the procedure today (05/02/16) & will resume Coumadin tonight per documentation in electronic medical record.  I gave orders to [place] at [facility] to have an INR drawn on the pt in 1 week on 05/09/16 and to call to the [place] office with results.

## 2016-05-02 NOTE — Interval H&P Note (Signed)
History and Physical Interval Note:  05/02/2016 11:00 AM  Audrey Mullen  has presented today for surgery, with the diagnosis of LEFT RENAL PELVIC STONE  The various methods of treatment have been discussed with the patient and family. After consideration of risks, benefits and other options for treatment, the patient has consented to  Procedure(s): EXTRACORPOREAL SHOCK WAVE LITHOTRIPSY (ESWL) (Left) as a surgical intervention .  The patient's history has been reviewed, patient examined, no change in status, stable for surgery.  I have reviewed the patient's chart and labs.  Questions were answered to the patient's satisfaction.     Modesty Rudy I Shabree Tebbetts

## 2016-05-02 NOTE — H&P (Signed)
Office Visit Report 04/26/2016    Audrey Dean. Mullen         MRN: [id number]  PRIMARY CARE:  Lucianne Lei, MD  DOB: 18-Dec-1940, 75 year old Female  REFERRING:    SSN: [id number]  PROVIDER:  Carolan Clines, M.D.    LOCATION:  [facility] [phone]    CC: I have kidney stones.  HPI: Audrey Mullen is a 75 year-old female established patient who is here for renal calculi.  The problem is on the left side. She first stated noticing pain on approximately 03/29/2016. This is not her first kidney stone. She has had 2 stones prior to getting this one. She is currently having flank pain, back pain, nausea, and vomiting. She denies having groin pain, fever, and chills. She has not caught a stone in her urine strainer since her symptoms began.   She has had eswl for treatment of her stones in the past.     CC/HPI: Hospital f/u to discuss surgery. Hx of an 72mm Lt UPJ stone, s/p Lt percutaneous nephrostomy tube placement.     CC: I have hydronephrosis (Stent Inserted).  HPI: Her stent was placed 04/02/2016 . The problem is on the left side. The stent was placed for a kidney stone. Patient denies ureteral stone, ureteral stricture, compression from mass, and blood clot.   She had the following x-rays done: CT Scan.   She is currently having flank pain and back pain. She denies having groin pain, nausea, vomiting, fever, and chills.     ALLERGIES: No Known Drug Allergies    MEDICATIONS: Warfarin Sodium 7.5 mg tablet tablet  Cephalexin 750 mg capsule  Diltiazem 24Hr Er 240 mg capsule, ext release 24 hr  Glipizide 5 mg tablet  Nystatin 100,000 unit/ml suspension, oral ml  Rosuvastatin Calcium     GU PSH: Renal ESWL - 2008, 2008     NON-GU PSH: Rotator Cuff Surgery          GU PMH: Kidney Stone, Nephrolithiasis - 2014       PMH Notes: Glaucoma, Arthritis    NON-GU PMH: Chronic atrial fibrillation, A. Fib spontaneously converted in hospital ( Dr. Johnsie Cancel). -  04/18/2016 Essential (primary) hypertension, Hypertension - 2014 Personal history of other diseases of the circulatory system, History of hypertension - 2014 Personal history of other endocrine, nutritional and metabolic disease, History of hypercholesterolemia - 2014 Pure hypercholesterolemia, unspecified, Hypercholesterolemia - 2014 Encounter for general adult medical examination without abnormal findings, Encounter for preventive health examination     FAMILY HISTORY: Deceased - Father, Mother    SOCIAL HISTORY: Marital Status: Married Patient has never smoked.  Has never drank.  Drinks 2 caffeinated drinks per day.      Notes: 1 son    REVIEW OF SYSTEMS:      GU Review Female:  gross hematuria due to percutaneous nephrostomy tube in place. Patient reports frequent urination and get up at night to urinate. Patient denies hard to postpone urination, burning /pain with urination, leakage of urine, stream starts and stops, trouble starting your stream, have to strain to urinate, and currently pregnant.     Gastrointestinal (Upper):  Patient reports indigestion/ heartburn and vomiting. Patient denies nausea.     Gastrointestinal (Lower):  Patient denies diarrhea and constipation.     Constitutional:  Patient reports fatigue. Patient denies fever, night sweats, and weight loss.     Skin:  Patient denies skin rash/ lesion and itching.     Eyes:  Patient reports blurred vision. Patient denies double vision.     Ears/ Nose/ Throat:  Patient denies sore throat and sinus problems.     Hematologic/Lymphatic:  Patient reports swollen glands. Patient denies easy bruising.     Cardiovascular:  Patient denies leg swelling and chest pains.     Respiratory:  Patient denies cough and shortness of breath.     Endocrine:  Patient denies excessive thirst.     Musculoskeletal:  Patient reports back pain. Patient denies joint pain.     Neurological:  Patient reports headaches. Patient denies dizziness.      Psychologic:  Patient denies depression and anxiety.     VITAL SIGNS:       Weight: 160 lb/72.6 kg       Height/Length: 64 in / 163 cm       BP: 175/82 mmHg       Heart Rate: 78 /min       BMI: 27.5        GU PHYSICAL EXAMINATION:    Bladder: Bladder soft. No tenderness, no mass, normal size.    MULTI-SYSTEM PHYSICAL EXAMINATION:    Constitutional: Obese. Appears older than their stated age. No physical deformities. Good grooming.   Neck: Neck symmetrical, not swollen. Normal tracheal position.   Respiratory: No labored breathing, no use of accessory muscles.   Cardiovascular: Warm extremities. Normal extremity pulses, no swelling, no varicosities.   Lymphatic: No enlargement, no tenderness of axillae, groin, neck lymph nodes.  Skin: No paleness, no jaundice, no cyanosis. No lesion, no ulcer, no rash.   Neurologic / Psychiatric: Oriented to time, oriented to place, oriented to person. No depression, no anxiety, no agitation.   Gastrointestinal: No mass, no tenderness, no rigidity, non obese abdomen. Left percutaneous drain.   Musculoskeletal: Normal gait and station of head and neck.   PAST DATA REVIEWED:   Source Of History:  Patient, Outside Source  Records Review:  Previous Doctor Records, Previous Hospital Records, Previous Patient Records  Urine Test Review:  Urinalysis   PROCEDURES:    KUB - 74000  A single view of the abdomen is obtained.  Bony Abnormalities:  No abnormalities of the spine or pelvis.  Fecal Stasis:  No fecal stasis.  Soft Tissue:  No soft tissue abnormalities.  Calculi:  No abnormal calcification.  Ureteral Stent:  No ureteral stent.      left perc nephrostomy in place.     Urinalysis w/Scope - 81001  Dipstick Dipstick Cont'd Micro  Specimen: Voided Bilirubin: Neg WBC/hpf: 0-5  Color: Yellow Ketones: Neg RBC/hpf: 20-40  Appearance: Clear Blood: 2+ Bacteria: Moderate  Specific Gravity: 1.020 Protein: Trace Epithelial Cells: 6-10  pH: 6.0  Nitrites: Neg   Glucose: Neg Leukocyte Esterase: Trace     ASSESSMENT:     ICD-10 Details  1 GU:  Kidney Stone - N20.0   2  Hydronephrosis with renal and ureteral calculous obstruction - N13.2   3  Pyonephrosis - N13.6    PLAN:   Orders  X-Rays: KUB  Schedule  Return Visit: Next Available Appointment - Schedule Surgery  Document  Letter(s):  Created for Patient: Clinical Summary    by Carolan Clines, M.D. on 04/26/16 at 5:47 PM (EDT)  The information contained in this medical record document is considered private and confidential patient information. This information can only be used for the medical diagnosis and/or medical services that are being provided by the patient's selected caregivers. This information can only be distributed outside  of the patient's care if the patient agrees and signs waivers of authorization for this information to be sent to an outside source or route.

## 2016-05-03 ENCOUNTER — Other Ambulatory Visit (INDEPENDENT_AMBULATORY_CARE_PROVIDER_SITE_OTHER): Payer: Medicare Other | Admitting: *Deleted

## 2016-05-03 DIAGNOSIS — N179 Acute kidney failure, unspecified: Secondary | ICD-10-CM

## 2016-05-03 LAB — BASIC METABOLIC PANEL
BUN: 16 mg/dL (ref 7–25)
CO2: 26 mmol/L (ref 20–31)
CREATININE: 1.3 mg/dL — AB (ref 0.60–0.93)
Calcium: 9.3 mg/dL (ref 8.6–10.4)
Chloride: 106 mmol/L (ref 98–110)
GLUCOSE: 124 mg/dL — AB (ref 65–99)
Potassium: 4 mmol/L (ref 3.5–5.3)
Sodium: 141 mmol/L (ref 135–146)

## 2016-05-03 NOTE — Addendum Note (Signed)
Addended by: Eulis Foster on: 05/03/2016 10:11 AM   Modules accepted: Orders

## 2016-05-07 DIAGNOSIS — I10 Essential (primary) hypertension: Secondary | ICD-10-CM | POA: Diagnosis not present

## 2016-05-07 DIAGNOSIS — E785 Hyperlipidemia, unspecified: Secondary | ICD-10-CM | POA: Diagnosis not present

## 2016-05-07 DIAGNOSIS — I480 Paroxysmal atrial fibrillation: Secondary | ICD-10-CM | POA: Diagnosis not present

## 2016-05-07 NOTE — Telephone Encounter (Signed)
Called patient about her lab results. Per Dr. Johnsie Cancel, stable mild CRF. Patient concerned about her coumadin. Informed patient of note below, that [facility] would be drawn her INR this Thursday. Patient verbalized understanding.

## 2016-05-08 DIAGNOSIS — N200 Calculus of kidney: Secondary | ICD-10-CM | POA: Diagnosis not present

## 2016-05-09 ENCOUNTER — Ambulatory Visit (INDEPENDENT_AMBULATORY_CARE_PROVIDER_SITE_OTHER): Payer: Medicare Other | Admitting: Internal Medicine

## 2016-05-09 DIAGNOSIS — Z436 Encounter for attention to other artificial openings of urinary tract: Secondary | ICD-10-CM | POA: Diagnosis not present

## 2016-05-09 DIAGNOSIS — I4891 Unspecified atrial fibrillation: Secondary | ICD-10-CM

## 2016-05-09 DIAGNOSIS — I482 Chronic atrial fibrillation: Secondary | ICD-10-CM | POA: Diagnosis not present

## 2016-05-09 DIAGNOSIS — N10 Acute tubulo-interstitial nephritis: Secondary | ICD-10-CM | POA: Diagnosis not present

## 2016-05-09 DIAGNOSIS — I10 Essential (primary) hypertension: Secondary | ICD-10-CM | POA: Diagnosis not present

## 2016-05-09 DIAGNOSIS — Z7901 Long term (current) use of anticoagulants: Secondary | ICD-10-CM | POA: Diagnosis not present

## 2016-05-09 DIAGNOSIS — M1991 Primary osteoarthritis, unspecified site: Secondary | ICD-10-CM | POA: Diagnosis not present

## 2016-05-09 DIAGNOSIS — E785 Hyperlipidemia, unspecified: Secondary | ICD-10-CM | POA: Diagnosis not present

## 2016-05-09 DIAGNOSIS — N201 Calculus of ureter: Secondary | ICD-10-CM | POA: Diagnosis not present

## 2016-05-09 DIAGNOSIS — E119 Type 2 diabetes mellitus without complications: Secondary | ICD-10-CM | POA: Diagnosis not present

## 2016-05-09 DIAGNOSIS — N136 Pyonephrosis: Secondary | ICD-10-CM | POA: Diagnosis not present

## 2016-05-09 DIAGNOSIS — Z5181 Encounter for therapeutic drug level monitoring: Secondary | ICD-10-CM | POA: Diagnosis not present

## 2016-05-09 DIAGNOSIS — Z792 Long term (current) use of antibiotics: Secondary | ICD-10-CM | POA: Diagnosis not present

## 2016-05-09 LAB — POCT INR: INR: 1

## 2016-05-14 ENCOUNTER — Other Ambulatory Visit: Payer: Self-pay | Admitting: Urology

## 2016-05-15 ENCOUNTER — Ambulatory Visit (INDEPENDENT_AMBULATORY_CARE_PROVIDER_SITE_OTHER): Payer: Medicare Other | Admitting: Internal Medicine

## 2016-05-15 DIAGNOSIS — N136 Pyonephrosis: Secondary | ICD-10-CM | POA: Diagnosis not present

## 2016-05-15 DIAGNOSIS — I10 Essential (primary) hypertension: Secondary | ICD-10-CM | POA: Diagnosis not present

## 2016-05-15 DIAGNOSIS — M1991 Primary osteoarthritis, unspecified site: Secondary | ICD-10-CM | POA: Diagnosis not present

## 2016-05-15 DIAGNOSIS — Z792 Long term (current) use of antibiotics: Secondary | ICD-10-CM | POA: Diagnosis not present

## 2016-05-15 DIAGNOSIS — N10 Acute tubulo-interstitial nephritis: Secondary | ICD-10-CM | POA: Diagnosis not present

## 2016-05-15 DIAGNOSIS — Z436 Encounter for attention to other artificial openings of urinary tract: Secondary | ICD-10-CM | POA: Diagnosis not present

## 2016-05-15 DIAGNOSIS — E119 Type 2 diabetes mellitus without complications: Secondary | ICD-10-CM | POA: Diagnosis not present

## 2016-05-15 DIAGNOSIS — Z5181 Encounter for therapeutic drug level monitoring: Secondary | ICD-10-CM | POA: Diagnosis not present

## 2016-05-15 DIAGNOSIS — E785 Hyperlipidemia, unspecified: Secondary | ICD-10-CM | POA: Diagnosis not present

## 2016-05-15 DIAGNOSIS — I4891 Unspecified atrial fibrillation: Secondary | ICD-10-CM

## 2016-05-15 DIAGNOSIS — I482 Chronic atrial fibrillation: Secondary | ICD-10-CM | POA: Diagnosis not present

## 2016-05-15 DIAGNOSIS — N201 Calculus of ureter: Secondary | ICD-10-CM | POA: Diagnosis not present

## 2016-05-15 DIAGNOSIS — Z7901 Long term (current) use of anticoagulants: Secondary | ICD-10-CM | POA: Diagnosis not present

## 2016-05-15 LAB — POCT INR: INR: 2

## 2016-05-16 ENCOUNTER — Telehealth: Payer: Self-pay | Admitting: Pharmacist

## 2016-05-16 NOTE — Telephone Encounter (Signed)
Received fax from Dr. Gaynelle Arabian with [facility] that pt needs cystoscopy, left uteroscopy and laser removal of stone on 06/03/16. Request to hold Coumadin. Pt takes Coumadin for afib; CHADS2 score is 3 (HTN, DM, and age) with no hx of stroke. Ok to hold Coumadin x5 days prior to procedure. Clearance faxed 6/29 to [phone].

## 2016-05-22 DIAGNOSIS — E785 Hyperlipidemia, unspecified: Secondary | ICD-10-CM | POA: Diagnosis not present

## 2016-05-22 DIAGNOSIS — M1991 Primary osteoarthritis, unspecified site: Secondary | ICD-10-CM | POA: Diagnosis not present

## 2016-05-22 DIAGNOSIS — Z792 Long term (current) use of antibiotics: Secondary | ICD-10-CM | POA: Diagnosis not present

## 2016-05-22 DIAGNOSIS — Z5181 Encounter for therapeutic drug level monitoring: Secondary | ICD-10-CM | POA: Diagnosis not present

## 2016-05-22 DIAGNOSIS — I10 Essential (primary) hypertension: Secondary | ICD-10-CM | POA: Diagnosis not present

## 2016-05-22 DIAGNOSIS — Z7901 Long term (current) use of anticoagulants: Secondary | ICD-10-CM | POA: Diagnosis not present

## 2016-05-22 DIAGNOSIS — I482 Chronic atrial fibrillation: Secondary | ICD-10-CM | POA: Diagnosis not present

## 2016-05-22 DIAGNOSIS — N10 Acute tubulo-interstitial nephritis: Secondary | ICD-10-CM | POA: Diagnosis not present

## 2016-05-22 DIAGNOSIS — Z436 Encounter for attention to other artificial openings of urinary tract: Secondary | ICD-10-CM | POA: Diagnosis not present

## 2016-05-22 DIAGNOSIS — N201 Calculus of ureter: Secondary | ICD-10-CM | POA: Diagnosis not present

## 2016-05-22 DIAGNOSIS — E119 Type 2 diabetes mellitus without complications: Secondary | ICD-10-CM | POA: Diagnosis not present

## 2016-05-22 DIAGNOSIS — N136 Pyonephrosis: Secondary | ICD-10-CM | POA: Diagnosis not present

## 2016-05-23 ENCOUNTER — Ambulatory Visit (INDEPENDENT_AMBULATORY_CARE_PROVIDER_SITE_OTHER): Payer: Medicare Other | Admitting: Cardiology

## 2016-05-23 DIAGNOSIS — I4891 Unspecified atrial fibrillation: Secondary | ICD-10-CM

## 2016-05-23 LAB — POCT INR: INR: 2.9

## 2016-05-27 ENCOUNTER — Ambulatory Visit (INDEPENDENT_AMBULATORY_CARE_PROVIDER_SITE_OTHER): Payer: Medicare Other | Admitting: Internal Medicine

## 2016-05-27 DIAGNOSIS — I4891 Unspecified atrial fibrillation: Secondary | ICD-10-CM

## 2016-05-27 LAB — POCT INR: INR: 2.9

## 2016-05-28 ENCOUNTER — Ambulatory Visit (INDEPENDENT_AMBULATORY_CARE_PROVIDER_SITE_OTHER): Payer: Medicare Other

## 2016-05-28 DIAGNOSIS — Z7901 Long term (current) use of anticoagulants: Secondary | ICD-10-CM | POA: Diagnosis not present

## 2016-05-28 DIAGNOSIS — Z5181 Encounter for therapeutic drug level monitoring: Secondary | ICD-10-CM | POA: Diagnosis not present

## 2016-05-28 DIAGNOSIS — Z436 Encounter for attention to other artificial openings of urinary tract: Secondary | ICD-10-CM | POA: Diagnosis not present

## 2016-05-28 DIAGNOSIS — I10 Essential (primary) hypertension: Secondary | ICD-10-CM | POA: Diagnosis not present

## 2016-05-28 DIAGNOSIS — Z792 Long term (current) use of antibiotics: Secondary | ICD-10-CM | POA: Diagnosis not present

## 2016-05-28 DIAGNOSIS — I482 Chronic atrial fibrillation: Secondary | ICD-10-CM | POA: Diagnosis not present

## 2016-05-28 DIAGNOSIS — M1991 Primary osteoarthritis, unspecified site: Secondary | ICD-10-CM | POA: Diagnosis not present

## 2016-05-28 DIAGNOSIS — N10 Acute tubulo-interstitial nephritis: Secondary | ICD-10-CM | POA: Diagnosis not present

## 2016-05-28 DIAGNOSIS — N136 Pyonephrosis: Secondary | ICD-10-CM | POA: Diagnosis not present

## 2016-05-28 DIAGNOSIS — E785 Hyperlipidemia, unspecified: Secondary | ICD-10-CM | POA: Diagnosis not present

## 2016-05-28 DIAGNOSIS — I4891 Unspecified atrial fibrillation: Secondary | ICD-10-CM

## 2016-05-28 DIAGNOSIS — E119 Type 2 diabetes mellitus without complications: Secondary | ICD-10-CM | POA: Diagnosis not present

## 2016-05-28 DIAGNOSIS — N201 Calculus of ureter: Secondary | ICD-10-CM | POA: Diagnosis not present

## 2016-05-28 LAB — POCT INR: INR: 3.2

## 2016-05-30 ENCOUNTER — Encounter (HOSPITAL_COMMUNITY): Payer: Self-pay

## 2016-05-30 ENCOUNTER — Encounter (HOSPITAL_COMMUNITY)
Admission: RE | Admit: 2016-05-30 | Discharge: 2016-05-30 | Disposition: A | Discharge status: Home / Self Care | Payer: Medicare Other | Source: Ambulatory Visit | Attending: Urology | Admitting: Urology

## 2016-05-30 DIAGNOSIS — Z01812 Encounter for preprocedural laboratory examination: Secondary | ICD-10-CM | POA: Diagnosis not present

## 2016-05-30 HISTORY — DX: Presence of spectacles and contact lenses: Z97.3

## 2016-05-30 HISTORY — DX: Urinary tract infection, site not specified: N39.0

## 2016-05-30 HISTORY — DX: Calculus of kidney: N20.0

## 2016-05-30 HISTORY — DX: Unspecified glaucoma: H40.9

## 2016-05-30 LAB — BASIC METABOLIC PANEL
Anion gap: 6 (ref 5–15)
BUN: 15 mg/dL (ref 6–20)
CALCIUM: 9.2 mg/dL (ref 8.9–10.3)
CO2: 27 mmol/L (ref 22–32)
Chloride: 110 mmol/L (ref 101–111)
Creatinine, Ser: 1.29 mg/dL — ABNORMAL HIGH (ref 0.44–1.00)
GFR calc Af Amer: 45 mL/min — ABNORMAL LOW (ref >60)
GFR, EST NON AFRICAN AMERICAN: 39 mL/min — AB (ref >60)
Glucose, Bld: 84 mg/dL (ref 65–99)
POTASSIUM: 4.6 mmol/L (ref 3.5–5.1)
SODIUM: 143 mmol/L (ref 135–145)

## 2016-05-30 LAB — CBC
HEMATOCRIT: 37.7 % (ref 36.0–46.0)
Hemoglobin: 11.6 g/dL — ABNORMAL LOW (ref 12.0–15.0)
MCH: 26.4 pg (ref 26.0–34.0)
MCHC: 30.8 g/dL (ref 30.0–36.0)
MCV: 85.7 fL (ref 78.0–100.0)
PLATELETS: 229 10*3/uL (ref 150–400)
RBC: 4.4 MIL/uL (ref 3.87–5.11)
RDW: 14.6 % (ref 11.5–15.5)
WBC: 8.8 10*3/uL (ref 4.0–10.5)

## 2016-05-30 NOTE — Patient Instructions (Signed)
Audrey Mullen  05/30/2016   Your procedure is scheduled on: Monday June 03, 2016  Report to [facility] Main  Entrance take [place]  elevators to 3rd floor to  [place] at 7:00 AM.  Call this number if you have problems the morning of surgery [phone]   Remember: ONLY 1 PERSON MAY GO WITH YOU TO SHORT STAY TO GET  READY MORNING OF [place].  Do not eat food or drink liquids :After Midnight.     Take these medicines the morning of surgery with A SIP OF WATER: Diltiazem (Cardizem) DO NOT TAKE ANY DIABETIC MEDICATIONS DAY OF YOUR SURGERY                               You may not have any metal on your body including hair pins and              piercings  Do not wear jewelry, make-up, lotions, powders or perfumes, deodorant             Do not wear nail polish.  Do not shave  48 hours prior to surgery.             Do not bring valuables to the hospital. [place].  Contacts, dentures or bridgework may not be worn into surgery.  Leave suitcase in the car. After surgery it may be brought to your room.  Special Instructions: N/A          _____________________________________________________________________             [facility] - Preparing for Surgery Before surgery, you can play an important role.  Because skin is not sterile, your skin needs to be as free of germs as possible.  You can reduce the number of germs on your skin by washing with CHG (chlorahexidine gluconate) soap before surgery.  CHG is an antiseptic cleaner which kills germs and bonds with the skin to continue killing germs even after washing. Please DO NOT use if you have an allergy to CHG or antibacterial soaps.  If your skin becomes reddened/irritated stop using the CHG and inform your nurse when you arrive at Short Stay. Do not shave (including legs and underarms) for at least 48 hours prior to the first CHG shower.  You may  shave your face/neck. Please follow these instructions carefully:  1.  Shower with CHG Soap the night before surgery and the  morning of Surgery.  2.  If you choose to wash your hair, wash your hair first as usual with your  normal  shampoo.  3.  After you shampoo, rinse your hair and body thoroughly to remove the  shampoo.                           4.  Use CHG as you would any other liquid soap.  You can apply chg directly  to the skin and wash                       Gently with a scrungie or clean washcloth.  5.  Apply the CHG Soap to your body ONLY FROM THE NECK DOWN.  Do not use on face/ open                           Wound or open sores. Avoid contact with eyes, ears mouth and genitals (private parts).                       Wash face,  Genitals (private parts) with your normal soap.             6.  Wash thoroughly, paying special attention to the area where your surgery  will be performed.  7.  Thoroughly rinse your body with warm water from the neck down.  8.  DO NOT shower/wash with your normal soap after using and rinsing off  the CHG Soap.                9.  Pat yourself dry with a clean towel.            10.  Wear clean pajamas.            11.  Place clean sheets on your bed the night of your first shower and do not  sleep with pets. Day of Surgery : Do not apply any lotions/deodorants the morning of surgery.  Please wear clean clothes to the hospital/surgery center.  FAILURE TO FOLLOW THESE INSTRUCTIONS MAY RESULT IN THE CANCELLATION OF YOUR SURGERY PATIENT SIGNATURE_________________________________  NURSE SIGNATURE__________________________________  ________________________________________________________________________

## 2016-05-31 LAB — HEMOGLOBIN A1C
Hgb A1c MFr Bld: 7.3 % — ABNORMAL HIGH (ref 4.8–5.6)
MEAN PLASMA GLUCOSE: 163 mg/dL

## 2016-06-03 ENCOUNTER — Ambulatory Visit (HOSPITAL_COMMUNITY): Payer: Medicare Other | Admitting: Certified Registered Nurse Anesthetist

## 2016-06-03 ENCOUNTER — Encounter: Payer: Self-pay | Admitting: Cardiovascular Disease

## 2016-06-03 ENCOUNTER — Ambulatory Visit (HOSPITAL_COMMUNITY)
Admission: RE | Admit: 2016-06-03 | Discharge: 2016-06-03 | Disposition: A | Discharge status: Home / Self Care | Payer: Medicare Other | Source: Ambulatory Visit | Attending: Urology | Admitting: Urology

## 2016-06-03 ENCOUNTER — Encounter (HOSPITAL_COMMUNITY): Payer: Self-pay | Admitting: *Deleted

## 2016-06-03 ENCOUNTER — Encounter (HOSPITAL_COMMUNITY)
Admission: RE | Disposition: A | Discharge status: Home / Self Care | Payer: Self-pay | Source: Ambulatory Visit | Attending: Urology

## 2016-06-03 DIAGNOSIS — Z79899 Other long term (current) drug therapy: Secondary | ICD-10-CM | POA: Diagnosis not present

## 2016-06-03 DIAGNOSIS — I482 Chronic atrial fibrillation: Secondary | ICD-10-CM | POA: Diagnosis not present

## 2016-06-03 DIAGNOSIS — N136 Pyonephrosis: Secondary | ICD-10-CM | POA: Insufficient documentation

## 2016-06-03 DIAGNOSIS — N201 Calculus of ureter: Secondary | ICD-10-CM | POA: Diagnosis not present

## 2016-06-03 DIAGNOSIS — Z7984 Long term (current) use of oral hypoglycemic drugs: Secondary | ICD-10-CM | POA: Diagnosis not present

## 2016-06-03 DIAGNOSIS — M1990 Unspecified osteoarthritis, unspecified site: Secondary | ICD-10-CM | POA: Insufficient documentation

## 2016-06-03 DIAGNOSIS — I10 Essential (primary) hypertension: Secondary | ICD-10-CM | POA: Diagnosis not present

## 2016-06-03 DIAGNOSIS — N132 Hydronephrosis with renal and ureteral calculous obstruction: Secondary | ICD-10-CM

## 2016-06-03 DIAGNOSIS — Z7901 Long term (current) use of anticoagulants: Secondary | ICD-10-CM | POA: Diagnosis not present

## 2016-06-03 DIAGNOSIS — E119 Type 2 diabetes mellitus without complications: Secondary | ICD-10-CM | POA: Insufficient documentation

## 2016-06-03 DIAGNOSIS — N200 Calculus of kidney: Secondary | ICD-10-CM | POA: Insufficient documentation

## 2016-06-03 HISTORY — PX: CYSTOSCOPY WITH RETROGRADE PYELOGRAM, URETEROSCOPY AND STENT PLACEMENT: SHX5789

## 2016-06-03 HISTORY — PX: HOLMIUM LASER APPLICATION: SHX5852

## 2016-06-03 LAB — GLUCOSE, CAPILLARY
GLUCOSE-CAPILLARY: 151 mg/dL — AB (ref 65–99)
GLUCOSE-CAPILLARY: 149 mg/dL — AB (ref 65–99)

## 2016-06-03 LAB — PROTIME-INR
INR: 1.1 (ref 0.00–1.49)
Prothrombin Time: 14 seconds (ref 11.6–15.2)

## 2016-06-03 SURGERY — CYSTOURETEROSCOPY, WITH RETROGRADE PYELOGRAM AND STENT INSERTION
Anesthesia: General | Laterality: Left

## 2016-06-03 MED ORDER — TRAMADOL-ACETAMINOPHEN 37.5-325 MG PO TABS: 1.0000 | ORAL_TABLET | Freq: Four times a day (QID) | ORAL | Status: DC | PRN

## 2016-06-03 MED ORDER — CEFAZOLIN SODIUM-DEXTROSE 2-4 GM/100ML-% IV SOLN
2.0000 g | INTRAVENOUS | Status: AC
  Administered 2016-06-03: 2 g via INTRAVENOUS
  Filled 2016-06-03: qty 100

## 2016-06-03 MED ORDER — BELLADONNA ALKALOIDS-OPIUM 16.2-60 MG RE SUPP
RECTAL | Status: DC | PRN
  Administered 2016-06-03: 1 via RECTAL

## 2016-06-03 MED ORDER — CEFAZOLIN SODIUM-DEXTROSE 2-4 GM/100ML-% IV SOLN
INTRAVENOUS | Status: AC
  Filled 2016-06-03: qty 100

## 2016-06-03 MED ORDER — ONDANSETRON HCL 4 MG/2ML IJ SOLN
INTRAMUSCULAR | Status: DC | PRN
  Administered 2016-06-03: 4 mg via INTRAVENOUS

## 2016-06-03 MED ORDER — SODIUM CHLORIDE 0.9 % IJ SOLN
INTRAMUSCULAR | Status: AC
  Filled 2016-06-03: qty 10

## 2016-06-03 MED ORDER — ACETAMINOPHEN 500 MG PO TABS: 1000.0000 mg | ORAL_TABLET | Freq: Four times a day (QID) | ORAL | Status: DC | PRN

## 2016-06-03 MED ORDER — SODIUM CHLORIDE 0.9 % IV SOLN
INTRAVENOUS | Status: DC | PRN
  Administered 2016-06-03: 7 mL

## 2016-06-03 MED ORDER — BELLADONNA ALKALOIDS-OPIUM 16.2-60 MG RE SUPP
RECTAL | Status: AC
Start: 2016-06-03 — End: 2016-06-03
  Filled 2016-06-03: qty 1

## 2016-06-03 MED ORDER — ACETAMINOPHEN 160 MG/5ML PO SOLN
1000.0000 mg | ORAL | Status: AC
  Administered 2016-06-03: 1000 mg via ORAL
  Filled 2016-06-03: qty 40.6

## 2016-06-03 MED ORDER — LIDOCAINE HCL (CARDIAC) 20 MG/ML IV SOLN
INTRAVENOUS | Status: AC
  Filled 2016-06-03: qty 5

## 2016-06-03 MED ORDER — ONDANSETRON HCL 4 MG/2ML IJ SOLN: 4.0000 mg | Freq: Once | INTRAMUSCULAR | Status: DC | PRN

## 2016-06-03 MED ORDER — KETOROLAC TROMETHAMINE 30 MG/ML IJ SOLN
INTRAMUSCULAR | Status: AC
  Filled 2016-06-03: qty 1

## 2016-06-03 MED ORDER — HYDROMORPHONE HCL 1 MG/ML IJ SOLN: 0.2500 mg | INTRAMUSCULAR | Status: DC | PRN

## 2016-06-03 MED ORDER — EPHEDRINE SULFATE 50 MG/ML IJ SOLN
INTRAMUSCULAR | Status: DC | PRN
  Administered 2016-06-03 (×2): 5 mg via INTRAVENOUS
  Administered 2016-06-03: 10 mg via INTRAVENOUS

## 2016-06-03 MED ORDER — FLUORESCEIN SODIUM 10 % IV SOLN
500.0000 mg | Freq: Once | INTRAVENOUS | Status: DC
  Filled 2016-06-03: qty 5

## 2016-06-03 MED ORDER — FENTANYL CITRATE (PF) 100 MCG/2ML IJ SOLN
INTRAMUSCULAR | Status: AC
  Filled 2016-06-03: qty 2

## 2016-06-03 MED ORDER — PROPOFOL 10 MG/ML IV BOLUS
INTRAVENOUS | Status: AC
  Filled 2016-06-03: qty 20

## 2016-06-03 MED ORDER — PHENAZOPYRIDINE HCL 200 MG PO TABS: 200.0000 mg | ORAL_TABLET | Freq: Three times a day (TID) | ORAL | Status: DC | PRN

## 2016-06-03 MED ORDER — SODIUM CHLORIDE 0.9 % IR SOLN
Status: DC | PRN
  Administered 2016-06-03: 4000 mL via INTRAVESICAL

## 2016-06-03 MED ORDER — PROPOFOL 10 MG/ML IV BOLUS
INTRAVENOUS | Status: DC | PRN
  Administered 2016-06-03: 200 mg via INTRAVENOUS

## 2016-06-03 MED ORDER — FLUORESCEIN SODIUM 10 % IV SOLN
INTRAVENOUS | Status: AC
  Filled 2016-06-03: qty 5

## 2016-06-03 MED ORDER — KETOROLAC TROMETHAMINE 15 MG/ML IJ SOLN
INTRAMUSCULAR | Status: DC | PRN
  Administered 2016-06-03: 15 mg via INTRAVENOUS

## 2016-06-03 MED ORDER — LIDOCAINE HCL (CARDIAC) 20 MG/ML IV SOLN
INTRAVENOUS | Status: DC | PRN
  Administered 2016-06-03: 50 mg via INTRAVENOUS

## 2016-06-03 MED ORDER — MEPERIDINE HCL 50 MG/ML IJ SOLN: 6.2500 mg | INTRAMUSCULAR | Status: DC | PRN

## 2016-06-03 MED ORDER — DEXAMETHASONE SODIUM PHOSPHATE 4 MG/ML IJ SOLN
INTRAMUSCULAR | Status: DC | PRN
  Administered 2016-06-03: 10 mg via INTRAVENOUS

## 2016-06-03 MED ORDER — EPHEDRINE SULFATE 50 MG/ML IJ SOLN
INTRAMUSCULAR | Status: AC
  Filled 2016-06-03: qty 1

## 2016-06-03 MED ORDER — LACTATED RINGERS IV SOLN
INTRAVENOUS | Status: DC
  Administered 2016-06-03 (×2): via INTRAVENOUS

## 2016-06-03 MED ORDER — FENTANYL CITRATE (PF) 100 MCG/2ML IJ SOLN
INTRAMUSCULAR | Status: DC | PRN
  Administered 2016-06-03 (×3): 25 ug via INTRAVENOUS

## 2016-06-03 MED ORDER — 0.9 % SODIUM CHLORIDE (POUR BTL) OPTIME
TOPICAL | Status: DC | PRN
  Administered 2016-06-03: 1000 mL

## 2016-06-03 SURGICAL SUPPLY — 25 items
BAG URO CATCHER STRL LF (MISCELLANEOUS) ×3 IMPLANT
BASKET STNLS GEMINI 4WIRE 3FR (BASKET) IMPLANT
BASKET ZERO TIP NITINOL 2.4FR (BASKET) IMPLANT
BSKT STON RTRVL GEM 120X11 3FR (BASKET)
BSKT STON RTRVL ZERO TP 2.4FR (BASKET)
CATH INTERMIT  6FR 70CM (CATHETERS) ×3 IMPLANT
CATH URET DUAL LUMEN 6-10FR 50 (CATHETERS) ×1 IMPLANT
CLOTH BEACON ORANGE TIMEOUT ST (SAFETY) ×3 IMPLANT
FIBER LASER FLEXIVA 1000 (UROLOGICAL SUPPLIES) IMPLANT
FIBER LASER FLEXIVA 365 (UROLOGICAL SUPPLIES) ×2 IMPLANT
FIBER LASER FLEXIVA 550 (UROLOGICAL SUPPLIES) IMPLANT
FIBER LASER TRAC TIP (UROLOGICAL SUPPLIES) IMPLANT
GAUZE SPONGE 4X4 12PLY STRL (GAUZE/BANDAGES/DRESSINGS) ×2 IMPLANT
GLOVE BIOGEL M STRL SZ7.5 (GLOVE) ×3 IMPLANT
GOWN STRL REUS W/TWL XL LVL3 (GOWN DISPOSABLE) ×3 IMPLANT
GUIDEWIRE STR DUAL SENSOR (WIRE) ×3 IMPLANT
IV NS 1000ML (IV SOLUTION)
IV NS 1000ML BAXH (IV SOLUTION) ×1 IMPLANT
MANIFOLD NEPTUNE II (INSTRUMENTS) ×3 IMPLANT
PACK CYSTO (CUSTOM PROCEDURE TRAY) ×3 IMPLANT
STENT URET 6FRX24 CONTOUR (STENTS) ×2 IMPLANT
SYRINGE IRR TOOMEY STRL 70CC (SYRINGE) ×2 IMPLANT
TAPE CLOTH SOFT 2X10 (GAUZE/BANDAGES/DRESSINGS) ×2 IMPLANT
TUBING CONNECTING 10 (TUBING) ×2 IMPLANT
TUBING CONNECTING 10' (TUBING) ×1

## 2016-06-03 NOTE — Op Note (Signed)
Pre-operative diagnosis :   Left upper ureteral stone with left percutaneous nephrostomy in place  Postoperative diagnosis:  Same  Operation:  Cystourethroscopy, left retrograde pyelogram with interpretation, left ureteroscopy, fractionation of left upper ureteral stone, basket extraction of stone fragments, left pyeloscopy, removal of left nephrostomy tube, placement of left double-J stent (6 [place] by 24 cm).  Surgeon:  Chauncey Cruel. Gaynelle Arabian, MD  First assistant:  None  Anesthesia:  Gen. LMA  Preparation: After appropriate preanesthesia, the patient was brought to the operating room, placed on the operating table in the dorsal supine position where general LMA anesthesia was introduced. She was then replaced in the dorsal lithotomy position with the pubis was prepped with Betadine solution and draped in usual fashion. The arm and was double checked. The history was double checked. The nephrostomy tube was clamped.  Review history:      CC: I have hydronephrosis (Stent Inserted).  HPI: Her stent was placed 04/02/2016 . The problem is on the left side. The stent was placed for a kidney stone. Patient denies ureteral stone, ureteral stricture, compression from mass, and blood clot.   She had the following x-rays done: CT Scan.   She is currently having flank pain and back pain. She denies having groin pain, nausea, vomiting, fever, and chills.     ALLERGIES: No Known Drug Allergies    MEDICATIONS: Warfarin Sodium 7.5 mg tablet tablet  Cephalexin 750 mg capsule  Diltiazem 24Hr Er 240 mg capsule, ext release 24 hr  Glipizide 5 mg tablet  Nystatin 100,000 unit/ml suspension, oral ml  Rosuvastatin Calcium            Statement of  Likelihood of Success: Excellent. TIME-OUT observed.:  Procedure:  Cystourethroscopy was accomplished, and the urethral orifices were identified in normal position on the trigone. There was some blood clot coming from the left ureteral orifice. Left  retrograde Polygram was performed, showing a cake in the left lower ureter, and possible stone identified in the left upper ureter. The nephrostomy tube was well identified in good position in the left renal pelvis. A 0.038 guidewire was then manipulated around the kink in the lower ureter, and into the left renal pelvis under fluoroscopic control. Over this, a double bridge ureteroscope was passed, straightening out the cake in the lower ureter. I was able to identify an 8 mm left upper ureteral calculus, and using the 365 mm laser fiber, the stone was able to be broken into 2 large pieces, and one tiny piece. The 2 large pieces were basketed extracted, and the third tiny fragment floated as the renal pelvis. Pyeloscopy was accomplished, but I did not see the small fragment. I elected to pass a double-J stent, and this was passed over a guidewire and coiled and renal pelvis. The left nephrostomy tube was removed. The double-J stent was left without a tether.  The patient received by mouth Tylenol preop of the, IV antibiotics preop and he, and Toradol at the end of procedure. Because the first attempt at double-J stenting was pulled outside when medically removing the nephrostomy tube, I replaced the double-J stent without difficulty. This required a second sterile double-J catheter placement.  The patient was awakened and taken to recovery room in excellent condition.

## 2016-06-03 NOTE — Transfer of Care (Signed)
Immediate Anesthesia Transfer of Care Note  Patient: Audrey Mullen  Procedure(s) Performed: Procedure(s): CYSTOSCOPY WITH LEFT RETROGRADE PYELOGRAM, URETEROSCOPY, REMOVAL OF LEFT NEPHROSTOMY TUBE, INSERTION OF DOUBLE J STENT, LEFT (Left) HOLMIUM LASER OF STONE  (Left)  Patient Location: PACU  Anesthesia Type:General  Level of Consciousness:  sedated, patient cooperative and responds to stimulation  Airway & Oxygen Therapy:Patient Spontanous Breathing and Patient connected to face mask oxgen  Post-op Assessment:  Report given to PACU RN and Post -op Vital signs reviewed and stable  Post vital signs:  Reviewed and stable  Last Vitals:  Filed Vitals:   06/03/16 0704  BP: 166/73  Pulse: 61  Temp: 36.7 C  Resp: 18    Complications: No apparent anesthesia complications

## 2016-06-03 NOTE — Discharge Instructions (Signed)
Dietary Guidelines to Help Prevent Kidney Stones Your risk of kidney stones can be decreased by adjusting the foods you eat. The most important thing you can do is drink enough fluid. You should drink enough fluid to keep your urine clear or pale yellow. The following guidelines provide specific information for the type of kidney stone you have had. GUIDELINES ACCORDING TO TYPE OF KIDNEY STONE Calcium Oxalate Kidney Stones  Reduce the amount of salt you eat. Foods that have a lot of salt cause your body to release excess calcium into your urine. The excess calcium can combine with a substance called oxalate to form kidney stones.  Reduce the amount of animal protein you eat if the amount you eat is excessive. Animal protein causes your body to release excess calcium into your urine. Ask your dietitian how much protein from animal sources you should be eating.  Avoid foods that are high in oxalates. If you take vitamins, they should have less than 500 mg of vitamin C. Your body turns vitamin C into oxalates. You do not need to avoid fruits and vegetables high in vitamin C. Calcium Phosphate Kidney Stones  Reduce the amount of salt you eat to help prevent the release of excess calcium into your urine.  Reduce the amount of animal protein you eat if the amount you eat is excessive. Animal protein causes your body to release excess calcium into your urine. Ask your dietitian how much protein from animal sources you should be eating.  Get enough calcium from food or take a calcium supplement (ask your dietitian for recommendations). Food sources of calcium that do not increase your risk of kidney stones include:  Broccoli.  Dairy products, such as cheese and yogurt.  Pudding. Uric Acid Kidney Stones  Do not have more than 6 oz of animal protein per day. FOOD SOURCES Animal Protein Sources  Meat (all types).  Poultry.  Eggs.  Fish, seafood. Foods High in [employer] seasonings.  Soy  sauce.  Teriyaki sauce.  Cured and processed meats.  Salted crackers and snack foods.  Fast food.  Canned soups and most canned foods. Foods High in Oxalates  Grains:  Amaranth.  Barley.  Grits.  Wheat germ.  Bran.  Buckwheat flour.  All bran cereals.  Pretzels.  Whole wheat bread.  Vegetables:  Beans (wax).  Beets and beet greens.  Collard greens.  Eggplant.  [place].  Leeks.  Okra.  Parsley.  Rutabagas.  Spinach.  Swiss chard.  Tomato paste.  Fried potatoes.  Sweet potatoes.  Fruits:  Red currants.  Figs.  Kiwi.  Rhubarb.  Meat and Other Protein Sources:  Beans (dried).  Soy burgers and other soybean products.  Miso.  Nuts (peanuts, almonds, pecans, cashews, hazelnuts).  Nut butters.  Sesame seeds and tahini (paste made of sesame seeds).  Poppy seeds.  Beverages:  Chocolate drink mixes.  Soy milk.  Instant iced tea.  Juices made from high-oxalate fruits or vegetables.  Other:  Carob.  Chocolate.  Fruitcake.  Marmalades.   This information is not intended to replace advice given to you by your health care provider. Make sure you discuss any questions you have with your health care provider.   Document Released: 03/01/2011 Document Revised: 11/09/2013 Document Reviewed: 10/01/2013 Elsevier Interactive Patient Education 2016 [place] Anesthesia, Adult, Care After Refer to this sheet in the next few weeks. These instructions provide you with information on caring for yourself after your procedure. Your health care  provider may also give you more specific instructions. Your treatment has been planned according to current medical practices, but problems sometimes occur. Call your health care provider if you have any problems or questions after your procedure. WHAT TO EXPECT AFTER THE PROCEDURE After the procedure, it is typical to experience:  Sleepiness.  Nausea and vomiting. HOME  CARE INSTRUCTIONS  For the first 24 hours after general anesthesia:  Have a responsible person with you.  Do not drive a car. If you are alone, do not take public transportation.  Do not drink alcohol.  Do not take medicine that has not been prescribed by your health care provider.  Do not sign important papers or make important decisions.  You may resume a normal diet and activities as directed by your health care provider.  If you have questions or problems that seem related to general anesthesia, call the hospital and ask for the anesthetist or anesthesiologist on call. SEEK MEDICAL CARE IF:  You have nausea and vomiting that continue the day after anesthesia.  You develop a rash. SEEK IMMEDIATE MEDICAL CARE IF:   You have difficulty breathing.  You have chest pain.  You have any allergic problems.   This information is not intended to replace advice given to you by your health care provider. Make sure you discuss any questions you have with your health care provider.   Document Released: 02/10/2001 Document Revised: 11/25/2014 Document Reviewed: 03/04/2012 Elsevier Interactive Patient Education [employer].

## 2016-06-03 NOTE — Anesthesia Postprocedure Evaluation (Signed)
Anesthesia Post Note  Patient: SARH MABERY  Procedure(s) Performed: Procedure(s) (LRB): CYSTOSCOPY WITH LEFT RETROGRADE PYELOGRAM, URETEROSCOPY, REMOVAL OF LEFT NEPHROSTOMY TUBE, INSERTION OF DOUBLE J STENT, LEFT (Left) HOLMIUM LASER OF STONE  (Left)  Patient location during evaluation: PACU Anesthesia Type: General Level of consciousness: awake and alert Pain management: pain level controlled Vital Signs Assessment: post-procedure vital signs reviewed and stable Respiratory status: spontaneous breathing, nonlabored ventilation, respiratory function stable and patient connected to nasal cannula oxygen Cardiovascular status: blood pressure returned to baseline and stable Postop Assessment: no signs of nausea or vomiting Anesthetic complications: no    Last Vitals:  Filed Vitals:   06/03/16 1215 06/03/16 1224  BP: 167/78 133/67  Pulse: 65 63  Temp: 36.5 C 36.6 C  Resp: 18 18    Last Pain:  Filed Vitals:   06/03/16 1225  PainSc: 0-No pain                 Arelys Glassco DAVID

## 2016-06-03 NOTE — H&P (Signed)
}   Office Visit Report 04/26/2016    Audrey Mullen         MRN: [id number]  PRIMARY CARE:  Audrey Lei, MD  DOB: 07/25/1941, 75 year old Female  REFERRING:    SSN: -[id number]  PROVIDER:  Carolan Clines, M.D.    LOCATION:  [facility] [phone]    CC: I have kidney stones.  HPI: Audrey Mullen is a 75 year-old female established patient who is here for renal calculi.  The problem is on the left side. She first stated noticing pain on approximately 03/29/2016. This is not her first kidney stone. She has had 2 stones prior to getting this one. She is currently having flank pain, back pain, nausea, and vomiting. She denies having groin pain, fever, and chills. She has not caught a stone in her urine strainer since her symptoms began.   She has had eswl for treatment of her stones in the past.     CC/HPI: Hospital f/u to discuss surgery. Hx of an 49mm Lt UPJ stone, s/p Lt percutaneous nephrostomy tube placement.     CC: I have hydronephrosis (Stent Inserted).  HPI: Her stent was placed 04/02/2016 . The problem is on the left side. The stent was placed for a kidney stone. Patient denies ureteral stone, ureteral stricture, compression from mass, and blood clot.   She had the following x-rays done: CT Scan.   She is currently having flank pain and back pain. She denies having groin pain, nausea, vomiting, fever, and chills.     ALLERGIES: No Known Drug Allergies    MEDICATIONS: Warfarin Sodium 7.5 mg tablet tablet  Cephalexin 750 mg capsule  Diltiazem 24Hr Er 240 mg capsule, ext release 24 hr  Glipizide 5 mg tablet  Nystatin 100,000 unit/ml suspension, oral ml  Rosuvastatin Calcium     GU PSH: Renal ESWL - 2008, 2008     NON-GU PSH: Rotator Cuff Surgery          GU PMH: Kidney Stone, Nephrolithiasis - 2014       PMH Notes: Glaucoma, Arthritis    NON-GU PMH: Chronic atrial fibrillation, A. Fib spontaneously converted in hospital ( Dr. Johnsie Cancel). -  04/18/2016 Essential (primary) hypertension, Hypertension - 2014 Personal history of other diseases of the circulatory system, History of hypertension - 2014 Personal history of other endocrine, nutritional and metabolic disease, History of hypercholesterolemia - 2014 Pure hypercholesterolemia, unspecified, Hypercholesterolemia - 2014 Encounter for general adult medical examination without abnormal findings, Encounter for preventive health examination     FAMILY HISTORY: Deceased - Father, Mother    SOCIAL HISTORY: Marital Status: Married Patient has never smoked.  Has never drank.  Drinks 2 caffeinated drinks per day.      Notes: 1 son    REVIEW OF SYSTEMS:      GU Review Female:  gross hematuria due to percutaneous nephrostomy tube in place. Patient reports frequent urination and get up at night to urinate. Patient denies hard to postpone urination, burning /pain with urination, leakage of urine, stream starts and stops, trouble starting your stream, have to strain to urinate, and currently pregnant.     Gastrointestinal (Upper):  Patient reports indigestion/ heartburn and vomiting. Patient denies nausea.     Gastrointestinal (Lower):  Patient denies diarrhea and constipation.     Constitutional:  Patient reports fatigue. Patient denies fever, night sweats, and weight loss.     Skin:  Patient denies skin rash/ lesion and itching.  Eyes:  Patient reports blurred vision. Patient denies double vision.     Ears/ Nose/ Throat:  Patient denies sore throat and sinus problems.     Hematologic/Lymphatic:  Patient reports swollen glands. Patient denies easy bruising.     Cardiovascular:  Patient denies leg swelling and chest pains.     Respiratory:  Patient denies cough and shortness of breath.     Endocrine:  Patient denies excessive thirst.     Musculoskeletal:  Patient reports back pain. Patient denies joint pain.     Neurological:  Patient reports headaches. Patient denies dizziness.      Psychologic:  Patient denies depression and anxiety.     VITAL SIGNS:       Weight: 160 lb/72.6 kg       Height/Length: 64 in / 163 cm       BP: 175/82 mmHg       Heart Rate: 78 /min       BMI: 27.5        GU PHYSICAL EXAMINATION:    Bladder: Bladder soft. No tenderness, no mass, normal size.    MULTI-SYSTEM PHYSICAL EXAMINATION:    Constitutional: Obese. Appears older than their stated age. No physical deformities. Good grooming.   Neck: Neck symmetrical, not swollen. Normal tracheal position.   Respiratory: No labored breathing, no use of accessory muscles.   Cardiovascular: Warm extremities. Normal extremity pulses, no swelling, no varicosities.   Lymphatic: No enlargement, no tenderness of axillae, groin, neck lymph nodes.  Skin: No paleness, no jaundice, no cyanosis. No lesion, no ulcer, no rash.   Neurologic / Psychiatric: Oriented to time, oriented to place, oriented to person. No depression, no anxiety, no agitation.   Gastrointestinal: No mass, no tenderness, no rigidity, non obese abdomen. Left percutaneous drain.   Musculoskeletal: Normal gait and station of head and neck.   PAST DATA REVIEWED:   Source Of History:  Patient, Outside Source  Records Review:  Previous Doctor Records, Previous Hospital Records, Previous Patient Records  Urine Test Review:  Urinalysis   PROCEDURES:    KUB - 74000  A single view of the abdomen is obtained.  Bony Abnormalities:  No abnormalities of the spine or pelvis.  Fecal Stasis:  No fecal stasis.  Soft Tissue:  No soft tissue abnormalities.  Calculi:  No abnormal calcification.  Ureteral Stent:  No ureteral stent.      left perc nephrostomy in place.     Urinalysis w/Scope - 81001  Dipstick Dipstick Cont'd Micro  Specimen: Voided Bilirubin: Neg WBC/hpf: 0-5  Color: Yellow Ketones: Neg RBC/hpf: 20-40  Appearance: Clear Blood: 2+ Bacteria: Moderate  Specific Gravity: 1.020 Protein: Trace Epithelial Cells: 6-10  pH: 6.0  Nitrites: Neg   Glucose: Neg Leukocyte Esterase: Trace     ASSESSMENT:     ICD-10 Details  1 GU:  Kidney Stone - N20.0   2  Hydronephrosis with renal and ureteral calculous obstruction - N13.2   3  Pyonephrosis - N13.6    PLAN:   Orders  X-Rays: KUB  Schedule  Return Visit: Next Available Appointment - Schedule Surgery  Document  Letter(s):  Created for Patient: Clinical Summary   Signed by Carolan Clines, M.D. on 04/26/16 at 5:47 PM (EDT)  The information contained in this medical record document is considered private and confidential patient information. This information can only be used for the medical diagnosis and/or medical services that are being provided by the patient's selected caregivers. This information can only be  distributed outside of the patient's care if the patient agrees and signs waivers of authorization for this information to be sent to an outside source or route.

## 2016-06-03 NOTE — Anesthesia Procedure Notes (Signed)
Procedure Name: LMA Insertion Date/Time: 06/03/2016 9:39 AM Performed by: Deliah Boston Pre-anesthesia Checklist: Patient identified, Emergency Drugs available, Suction available and Patient being monitored Patient Re-evaluated:Patient Re-evaluated prior to inductionOxygen Delivery Method: Circle system utilized Preoxygenation: Pre-oxygenation with 100% oxygen Intubation Type: IV induction Ventilation: Mask ventilation without difficulty LMA: LMA inserted LMA Size: 4.0 Number of attempts: 1 Placement Confirmation: positive ETCO2 and breath sounds checked- equal and bilateral Tube secured with: Tape Dental Injury: Teeth and Oropharynx as per pre-operative assessment

## 2016-06-03 NOTE — Interval H&P Note (Signed)
History and Physical Interval Note:  06/03/2016 9:35 AM  Audrey Mullen  has presented today for surgery, with the diagnosis of LEFT RENAL STONE   The various methods of treatment have been discussed with the patient and family. After consideration of risks, benefits and other options for treatment, the patient has consented to  Procedure(s): CYSTOSCOPY WITH LEFT RETROGRADE PYELOGRAM, URETEROSCOPY, REMOVAL OF LEFT NEPHROSTOMY TUBE (Left) HOLMIUM LASER OF STONE  (Left) as a surgical intervention .  The patient's history has been reviewed, patient examined, no change in status, stable for surgery.  I have reviewed the patient's chart and labs.  Questions were answered to the patient's satisfaction.     Trooper Olander I Felice Deem

## 2016-06-03 NOTE — Anesthesia Preprocedure Evaluation (Addendum)
Anesthesia Evaluation  Patient identified by MRN, date of birth, ID band Patient awake    Reviewed: Allergy & Precautions, NPO status , Patient's Chart, lab work & pertinent test results  Airway Mallampati: I  TM Distance: >3 FB Neck ROM: Full    Dental   Pulmonary    Pulmonary exam normal        Cardiovascular hypertension, Pt. on medications Normal cardiovascular exam+ dysrhythmias Atrial Fibrillation   ECHO 03/2016 Study Conclusions  - Left ventricle: The cavity size was normal. There was mild focal  basal hypertrophy of the septum. Systolic function was normal.  The estimated ejection fraction was in the range of 55% to 60%.  Wall motion was normal; there were no regional wall motion  abnormalities. Left ventricular diastolic function parameters  were normal. - Aortic valve: Trileaflet; moderately thickened, moderately  calcified leaflets. - Left atrium: The atrium was mildly dilated   Neuro/Psych    GI/Hepatic   Endo/Other  diabetes, Type 2, Oral Hypoglycemic Agents  Renal/GU      Musculoskeletal   Abdominal   Peds  Hematology   Anesthesia Other Findings   Reproductive/Obstetrics                            Anesthesia Physical Anesthesia Plan  ASA: II  Anesthesia Plan: General   Post-op Pain Management:    Induction: Intravenous  Airway Management Planned: LMA  Additional Equipment:   Intra-op Plan:   Post-operative Plan: Extubation in OR  Informed Consent: I have reviewed the patients History and Physical, chart, labs and discussed the procedure including the risks, benefits and alternatives for the proposed anesthesia with the patient or authorized representative who has indicated his/her understanding and acceptance.     Plan Discussed with: CRNA and Surgeon  Anesthesia Plan Comments:         Anesthesia Quick Evaluation

## 2016-06-06 DIAGNOSIS — Z5181 Encounter for therapeutic drug level monitoring: Secondary | ICD-10-CM | POA: Diagnosis not present

## 2016-06-06 DIAGNOSIS — E785 Hyperlipidemia, unspecified: Secondary | ICD-10-CM | POA: Diagnosis not present

## 2016-06-06 DIAGNOSIS — M1991 Primary osteoarthritis, unspecified site: Secondary | ICD-10-CM | POA: Diagnosis not present

## 2016-06-06 DIAGNOSIS — N201 Calculus of ureter: Secondary | ICD-10-CM | POA: Diagnosis not present

## 2016-06-06 DIAGNOSIS — E119 Type 2 diabetes mellitus without complications: Secondary | ICD-10-CM | POA: Diagnosis not present

## 2016-06-06 DIAGNOSIS — I482 Chronic atrial fibrillation: Secondary | ICD-10-CM | POA: Diagnosis not present

## 2016-06-06 DIAGNOSIS — I10 Essential (primary) hypertension: Secondary | ICD-10-CM | POA: Diagnosis not present

## 2016-06-06 DIAGNOSIS — Z7901 Long term (current) use of anticoagulants: Secondary | ICD-10-CM | POA: Diagnosis not present

## 2016-06-06 DIAGNOSIS — Z792 Long term (current) use of antibiotics: Secondary | ICD-10-CM | POA: Diagnosis not present

## 2016-06-06 DIAGNOSIS — N10 Acute tubulo-interstitial nephritis: Secondary | ICD-10-CM | POA: Diagnosis not present

## 2016-06-06 DIAGNOSIS — N136 Pyonephrosis: Secondary | ICD-10-CM | POA: Diagnosis not present

## 2016-06-06 DIAGNOSIS — Z436 Encounter for attention to other artificial openings of urinary tract: Secondary | ICD-10-CM | POA: Diagnosis not present

## 2016-06-07 ENCOUNTER — Other Ambulatory Visit: Payer: Self-pay

## 2016-06-10 ENCOUNTER — Telehealth: Payer: Self-pay

## 2016-06-10 ENCOUNTER — Ambulatory Visit (INDEPENDENT_AMBULATORY_CARE_PROVIDER_SITE_OTHER): Payer: Medicare Other | Admitting: *Deleted

## 2016-06-10 ENCOUNTER — Other Ambulatory Visit: Payer: Self-pay

## 2016-06-10 DIAGNOSIS — I4891 Unspecified atrial fibrillation: Secondary | ICD-10-CM | POA: Diagnosis not present

## 2016-06-10 LAB — POCT INR: INR: 1.3

## 2016-06-10 NOTE — Telephone Encounter (Signed)
Patient calling in requesting refill of glipizide. I explained she would need to contact her PCP about this and that it is not a cardiac medication. Patient would still like a message sent to see if glipizide can be filled by Dr. Johnsie Cancel.

## 2016-06-10 NOTE — Telephone Encounter (Signed)
Informed patient that Dr. Johnsie Cancel refills cardiac medications, and that she should see her PCP for refill. Patient stated that she was started on glipizide in the hospital, and her PCP did not prescribe this medication to her. Informed patient that after she leaves the hospital she should be following up with her PCP, so her PCP can continue her care. Patient stated she is not a diabetic. Informed patient that her HbgA1c is 7.3, and this is a strong indicator that she has diabetes. Informed patient that her lab results would be sent to her PCP Dr. Lucianne Lei and would route this message also.

## 2016-06-12 DIAGNOSIS — H401234 Low-tension glaucoma, bilateral, indeterminate stage: Secondary | ICD-10-CM | POA: Diagnosis not present

## 2016-06-12 DIAGNOSIS — H2511 Age-related nuclear cataract, right eye: Secondary | ICD-10-CM | POA: Diagnosis not present

## 2016-06-12 DIAGNOSIS — H18413 Arcus senilis, bilateral: Secondary | ICD-10-CM | POA: Diagnosis not present

## 2016-06-12 DIAGNOSIS — H25011 Cortical age-related cataract, right eye: Secondary | ICD-10-CM | POA: Diagnosis not present

## 2016-06-12 DIAGNOSIS — H47233 Glaucomatous optic atrophy, bilateral: Secondary | ICD-10-CM | POA: Diagnosis not present

## 2016-06-13 ENCOUNTER — Telehealth: Payer: Self-pay | Admitting: Cardiovascular Disease

## 2016-06-13 DIAGNOSIS — N201 Calculus of ureter: Secondary | ICD-10-CM | POA: Diagnosis not present

## 2016-06-13 DIAGNOSIS — R300 Dysuria: Secondary | ICD-10-CM | POA: Diagnosis not present

## 2016-06-13 NOTE — Telephone Encounter (Signed)
New MEssage  Pt requested to speak w/ rN about a medication that her PCP has prescribed her- Please call back and discuss.

## 2016-06-13 NOTE — Telephone Encounter (Signed)
Patient stated that she had called her PCP (Dr. Criss Rosales) about refilling her glipizide, and she has not heard back from them. Informed patient that a copy of her lab work was sent to them, and will send this message to her PCP also. Patient verbalized understanding, and will call her PCP to follow up.

## 2016-06-17 ENCOUNTER — Ambulatory Visit (INDEPENDENT_AMBULATORY_CARE_PROVIDER_SITE_OTHER): Payer: Medicare Other | Admitting: *Deleted

## 2016-06-17 DIAGNOSIS — I4891 Unspecified atrial fibrillation: Secondary | ICD-10-CM

## 2016-06-17 DIAGNOSIS — N132 Hydronephrosis with renal and ureteral calculous obstruction: Secondary | ICD-10-CM | POA: Diagnosis not present

## 2016-06-17 DIAGNOSIS — N201 Calculus of ureter: Secondary | ICD-10-CM | POA: Diagnosis not present

## 2016-06-17 LAB — POCT INR: INR: 2.5

## 2016-06-24 ENCOUNTER — Ambulatory Visit (INDEPENDENT_AMBULATORY_CARE_PROVIDER_SITE_OTHER): Payer: Medicare Other | Admitting: *Deleted

## 2016-06-24 DIAGNOSIS — I4891 Unspecified atrial fibrillation: Secondary | ICD-10-CM

## 2016-06-24 LAB — POCT INR: INR: 4.5

## 2016-06-25 DIAGNOSIS — J310 Chronic rhinitis: Secondary | ICD-10-CM | POA: Diagnosis not present

## 2016-06-25 DIAGNOSIS — H6521 Chronic serous otitis media, right ear: Secondary | ICD-10-CM | POA: Diagnosis not present

## 2016-07-04 ENCOUNTER — Encounter: Payer: Self-pay | Admitting: Cardiovascular Disease

## 2016-07-05 ENCOUNTER — Ambulatory Visit (INDEPENDENT_AMBULATORY_CARE_PROVIDER_SITE_OTHER): Payer: Medicare Other | Admitting: Pharmacist

## 2016-07-05 DIAGNOSIS — I4891 Unspecified atrial fibrillation: Secondary | ICD-10-CM | POA: Diagnosis not present

## 2016-07-05 LAB — POCT INR: INR: 1.6

## 2016-07-08 DIAGNOSIS — E785 Hyperlipidemia, unspecified: Secondary | ICD-10-CM | POA: Diagnosis not present

## 2016-07-08 DIAGNOSIS — E0811 Diabetes mellitus due to underlying condition with ketoacidosis with coma: Secondary | ICD-10-CM | POA: Diagnosis not present

## 2016-07-08 DIAGNOSIS — I480 Paroxysmal atrial fibrillation: Secondary | ICD-10-CM | POA: Diagnosis not present

## 2016-07-08 DIAGNOSIS — I119 Hypertensive heart disease without heart failure: Secondary | ICD-10-CM | POA: Diagnosis not present

## 2016-07-08 DIAGNOSIS — Z0000 Encounter for general adult medical examination without abnormal findings: Secondary | ICD-10-CM | POA: Diagnosis not present

## 2016-07-15 ENCOUNTER — Other Ambulatory Visit: Payer: Self-pay | Admitting: Physician Assistant

## 2016-07-15 NOTE — Telephone Encounter (Signed)
Review for refill, Thank you. 

## 2016-07-18 ENCOUNTER — Encounter: Payer: Self-pay | Admitting: *Deleted

## 2016-07-18 NOTE — Progress Notes (Signed)
CARDIOLOGY OFFICE NOTE  Date:  07/19/2016    Audrey Mullen Date of Birth: 07-25-1941 Medical Record [id number]  PCP:  Audrey Peers, MD  Cardiologist:  Dr. Johnsie Cancel  Chief Complaint  Patient presents with  . Atrial Fibrillation    no sx    History of Present Illness: Audrey Mullen is a 75 y.o. female who presents today for post hospital follow-up. Patient has history of HTN, HLD, DM, nephrolithiasis, no prior cardiac history who was  admitted at [facility] from 5/16-5/21 with sepsis secondary to acute pyelonephritis and obstructive uropathy. She was also found to be afib with RVR and elevated troponin. He had elevated lactic acid level, elevated white blood cell count on arrival. CTA scan of abdomen and pelvis revealed moderate left hydronephrosis secondary to UPJ obstruction from stone. He underwent nephrostomy tube placement on 04/02/2016. 2-D echocardiogram obtained on 04/03/2016 showed mild focal basal hypertrophy of the septum, EF 55-60%, no regional wall motion abnormalities. Due to elevated creatinine and low GFR, patient is not a candidate for NOAC. She was started on heparin with coumadin bridge.   06/03/16 had fractionation of left upper ureteral stone with basket extraction double J stent and removal of nephrostomy tube  Since I last saw on oral hypoglycemic for DM Lab Results  Component Value Date   HGBA1C 7.3 (H) 05/30/2016       Past Medical History:  Diagnosis Date  . Arthritis   . Diabetes mellitus without complication ([place])   . Dysrhythmia    atrail fibrillation  . Glaucoma    bilat   . Hyperlipidemia   . Hypertension   . Kidney stone   . Urinary tract infection   . Wears glasses     Past Surgical History:  Procedure Laterality Date  . CYSTOSCOPY WITH RETROGRADE PYELOGRAM, URETEROSCOPY AND STENT PLACEMENT Left 06/03/2016   Procedure: CYSTOSCOPY WITH LEFT RETROGRADE PYELOGRAM, URETEROSCOPY, REMOVAL OF LEFT NEPHROSTOMY TUBE,  INSERTION OF DOUBLE J STENT, LEFT;  Surgeon: Carolan Clines, MD;  Location: [facility];  Service: Urology;  Laterality: Left;  . EYE SURGERY     cataract removed per left eye   . HOLMIUM LASER APPLICATION Left [id number]   Procedure: HOLMIUM LASER OF STONE ;  Surgeon: Carolan Clines, MD;  Location: [facility];  Service: Urology;  Laterality: Left;  . ROTATOR CUFF REPAIR       Medications: Current Outpatient Prescriptions  Medication Sig Dispense Refill  . diltiazem (CARDIZEM CD) 240 MG 24 hr capsule Take 1 capsule (240 mg total) by mouth daily. 90 capsule 3  . latanoprost (XALATAN) 0.005 % ophthalmic solution Place 1 drop into both eyes at bedtime.    . rosuvastatin (CRESTOR) 20 MG tablet Take 20 mg by mouth daily.    Marland Kitchen warfarin (COUMADIN) 7.5 MG tablet Take as directed by [facility] 30 tablet 3   No current facility-administered medications for this visit.     Allergies: No Known Allergies  Social History: The patient  reports that she has never smoked. She has never used smokeless tobacco. She reports that she does not drink alcohol or use drugs.   Family History: The patient's family history is not on file.   Review of Systems: Please see the history of present illness.   Otherwise, the review of systems is positive for none.   All other systems are reviewed and negative.   Physical Exam: VS:  BP 140/86 (BP Location: Right Arm, Patient Position: Sitting, Cuff Size:  Normal)   Pulse 77   Ht 5\' 4"  (1.626 m)   Wt 159 lb (72.1 kg)   SpO2 95%   BMI 27.29 kg/m  .  BMI Body mass index is 27.29 kg/m.  Wt Readings from Last 3 Encounters:  07/19/16 159 lb (72.1 kg)  06/03/16 160 lb (72.6 kg)  05/30/16 160 lb (72.6 kg)    General: Pleasant. Well developed, well nourished and in no acute distress.   HEENT: Normal.  Neck: Supple, no JVD, carotid bruits, or masses noted.  Cardiac: Regular rate and rhythm. No murmurs, rubs, or gallops. No edema.  Respiratory:  Lungs are  clear to auscultation bilaterally with normal work of breathing.  GI: Soft and nontender.  MS: No deformity or atrophy. Gait and ROM intact.  Skin: Warm and dry. Color is normal.  Neuro:  Strength and sensation are intact and no gross focal deficits noted.  Psych: Alert, appropriate and with normal affect.   LABORATORY DATA:  EKG:  EKG is ordered today. This demonstrates NSR without significant ST-T wave changes.  Lab Results  Component Value Date   WBC 8.8 05/30/2016   HGB 11.6 (L) 05/30/2016   HCT 37.7 05/30/2016   PLT 229 05/30/2016   GLUCOSE 84 05/30/2016   CHOL 178 04/04/2016   TRIG 237 (H) 04/04/2016   HDL 24 (L) 04/04/2016   LDLCALC 107 (H) 04/04/2016   ALT 19 04/02/2016   AST 36 04/02/2016   NA 143 05/30/2016   K 4.6 05/30/2016   CL 110 05/30/2016   CREATININE 1.29 (H) 05/30/2016   BUN 15 05/30/2016   CO2 27 05/30/2016   TSH 0.352 04/02/2016   INR 1.6 07/05/2016   HGBA1C 7.3 (H) 05/30/2016    Other Studies Reviewed Today:  Echo 04/03/2016 LV EF: 55% - 60%  ------------------------------------------------------------------- Indications: Atrial fibrillation - 427.31.  ------------------------------------------------------------------- History: PMH: Kidney Stones- Sepsis. Elevated Troponin&'s. Risk factors: Diabetes mellitus. Dyslipidemia.  ------------------------------------------------------------------- Study Conclusions  - Left ventricle: The cavity size was normal. There was mild focal  basal hypertrophy of the septum. Systolic function was normal.  The estimated ejection fraction was in the range of 55% to 60%.  Wall motion was normal; there were no regional wall motion  abnormalities. Left ventricular diastolic function parameters  were normal. - Aortic valve: Trileaflet; moderately thickened, moderately  calcified leaflets. - Left atrium: The atrium was mildly dilated.   Perc drain 04/02/2016  Pre-procedure Diagnoses    1. Acute pyonephrosis [N13.6]       Expand All Collapse All   Successful LT PCN INSERTION NO COMP STABLE PURULENT URINE ASPIRATED GS/CX SENT FULL REPORT IN PACS         Assessment/Plan:  1. PAF on coumadin - now in NSR  - CHA2DS2-Vasc 5 (HTN, age, F sex, DM). Continue coumadin  - she has converted to NSR since discharge. She says her BP is normal at home, I will continue diltiazem CD 240mg  at this time No need for bridging did well off coumadin for urologic procedure   2. Acute pyelonephritis with obstructive uropathy post extraction / stenting follow by Urology  3. Acute renal insufficiency resolved  Lab Results  Component Value Date   CREATININE 1.29 (H) 05/30/2016   BUN 15 05/30/2016   NA 143 05/30/2016   K 4.6 05/30/2016   CL 110 05/30/2016   CO2 27 05/30/2016     4. HTN: Well controlled.  Continue current medications and low sodium Dash type diet.  5. HLD: on crestor  6. DM2: on glipizide discussed low carb diet    Current medicines are reviewed with the patient today.  The patient does not have concerns regarding medicines other than what has been noted above.  The following changes have been made:  See above.  Labs/ tests ordered today include:    No orders of the defined types were placed in this encounter.    Disposition:   FU with [place] in a year    Patient is agreeable to this plan and will call if any problems develop in the interim.   Jenkins Rouge

## 2016-07-19 ENCOUNTER — Encounter: Payer: Self-pay | Admitting: Cardiovascular Disease

## 2016-07-19 ENCOUNTER — Ambulatory Visit (INDEPENDENT_AMBULATORY_CARE_PROVIDER_SITE_OTHER): Payer: Medicare Other | Admitting: Cardiovascular Disease

## 2016-07-19 ENCOUNTER — Ambulatory Visit (INDEPENDENT_AMBULATORY_CARE_PROVIDER_SITE_OTHER): Payer: Medicare Other | Admitting: *Deleted

## 2016-07-19 ENCOUNTER — Ambulatory Visit: Payer: Medicare Other | Admitting: Cardiovascular Disease

## 2016-07-19 VITALS — BP 140/86 | HR 77 | Ht 64.0 in | Wt 159.0 lb

## 2016-07-19 DIAGNOSIS — I4891 Unspecified atrial fibrillation: Secondary | ICD-10-CM

## 2016-07-19 LAB — POCT INR: INR: 2.1

## 2016-07-19 NOTE — Patient Instructions (Addendum)

## 2016-07-29 ENCOUNTER — Telehealth: Payer: Self-pay | Admitting: *Deleted

## 2016-07-29 NOTE — Telephone Encounter (Signed)
Pt called stating she had headache and wanted to know what to take for it This nurse instructed that she can take Tylenol but if continues to call her PCP regarding this and she states understanding

## 2016-07-31 ENCOUNTER — Ambulatory Visit: Payer: Medicare Other | Admitting: Cardiovascular Disease

## 2016-08-09 ENCOUNTER — Ambulatory Visit (INDEPENDENT_AMBULATORY_CARE_PROVIDER_SITE_OTHER): Payer: Medicare Other | Admitting: *Deleted

## 2016-08-09 DIAGNOSIS — I4891 Unspecified atrial fibrillation: Secondary | ICD-10-CM | POA: Diagnosis not present

## 2016-08-09 LAB — POCT INR: INR: 2.5

## 2016-08-12 DIAGNOSIS — E785 Hyperlipidemia, unspecified: Secondary | ICD-10-CM | POA: Diagnosis not present

## 2016-08-12 DIAGNOSIS — I119 Hypertensive heart disease without heart failure: Secondary | ICD-10-CM | POA: Diagnosis not present

## 2016-08-12 DIAGNOSIS — I480 Paroxysmal atrial fibrillation: Secondary | ICD-10-CM | POA: Diagnosis not present

## 2016-08-12 DIAGNOSIS — E782 Mixed hyperlipidemia: Secondary | ICD-10-CM | POA: Diagnosis not present

## 2016-09-06 ENCOUNTER — Ambulatory Visit (INDEPENDENT_AMBULATORY_CARE_PROVIDER_SITE_OTHER): Payer: Medicare Other | Admitting: *Deleted

## 2016-09-06 DIAGNOSIS — I4891 Unspecified atrial fibrillation: Secondary | ICD-10-CM | POA: Diagnosis not present

## 2016-09-06 LAB — POCT INR: INR: 2.8

## 2016-09-11 DIAGNOSIS — E782 Mixed hyperlipidemia: Secondary | ICD-10-CM | POA: Diagnosis not present

## 2016-09-11 DIAGNOSIS — K8051 Calculus of bile duct without cholangitis or cholecystitis with obstruction: Secondary | ICD-10-CM | POA: Diagnosis not present

## 2016-09-11 DIAGNOSIS — I119 Hypertensive heart disease without heart failure: Secondary | ICD-10-CM | POA: Diagnosis not present

## 2016-09-11 DIAGNOSIS — E0811 Diabetes mellitus due to underlying condition with ketoacidosis with coma: Secondary | ICD-10-CM | POA: Diagnosis not present

## 2016-09-11 DIAGNOSIS — I4891 Unspecified atrial fibrillation: Secondary | ICD-10-CM | POA: Diagnosis not present

## 2016-09-12 DIAGNOSIS — H25011 Cortical age-related cataract, right eye: Secondary | ICD-10-CM | POA: Diagnosis not present

## 2016-09-12 DIAGNOSIS — H04123 Dry eye syndrome of bilateral lacrimal glands: Secondary | ICD-10-CM | POA: Diagnosis not present

## 2016-09-12 DIAGNOSIS — H26492 Other secondary cataract, left eye: Secondary | ICD-10-CM | POA: Diagnosis not present

## 2016-09-12 DIAGNOSIS — H11153 Pinguecula, bilateral: Secondary | ICD-10-CM | POA: Diagnosis not present

## 2016-09-12 DIAGNOSIS — H401231 Low-tension glaucoma, bilateral, mild stage: Secondary | ICD-10-CM | POA: Diagnosis not present

## 2016-09-13 ENCOUNTER — Telehealth: Payer: Self-pay | Admitting: Cardiovascular Disease

## 2016-09-13 NOTE — Telephone Encounter (Signed)
Returned patient's call. She stated that her doctor started her on prednisone 7.5mg  daily yesterday. Advised her that high doses of this medication can increase her INR. She has 2 refills on the prednisone. She is willing to come in in 1 week for an INR check.

## 2016-09-13 NOTE — Telephone Encounter (Signed)
New Message  Pt call requesting to speak with Rn about a change in medication. Please call back to discuss

## 2016-09-20 ENCOUNTER — Ambulatory Visit (INDEPENDENT_AMBULATORY_CARE_PROVIDER_SITE_OTHER): Payer: Medicare Other | Admitting: *Deleted

## 2016-09-20 DIAGNOSIS — I4891 Unspecified atrial fibrillation: Secondary | ICD-10-CM | POA: Diagnosis not present

## 2016-09-20 LAB — POCT INR: INR: 3

## 2016-10-18 ENCOUNTER — Ambulatory Visit (INDEPENDENT_AMBULATORY_CARE_PROVIDER_SITE_OTHER): Payer: Medicare Other | Admitting: *Deleted

## 2016-10-18 DIAGNOSIS — I4891 Unspecified atrial fibrillation: Secondary | ICD-10-CM | POA: Diagnosis not present

## 2016-10-18 LAB — POCT INR: INR: 1.9

## 2016-11-06 DIAGNOSIS — E119 Type 2 diabetes mellitus without complications: Secondary | ICD-10-CM | POA: Diagnosis not present

## 2016-11-06 DIAGNOSIS — I10 Essential (primary) hypertension: Secondary | ICD-10-CM | POA: Diagnosis not present

## 2016-11-06 NOTE — Progress Notes (Signed)
Cardiology Office Note    Date:  11/07/2016   ID:  Aurea, Bunce 1941/08/21, MRN [id number]  PCP:  Elyn Peers, MD  Cardiologist:  Dr. Johnsie Cancel  Chief Complaint: Chest pain  History of Present Illness:   Audrey Mullen is a 75 y.o. female HTN, HLD, DM, nephrolithiasis, and PAF who presented for chest pain.   She was  admitted at [facility] from 5/16-5/21 with sepsis secondary to acute pyelonephritis and obstructive uropathy. She was also found to be afib with RVR and elevated troponin. He had elevated lactic acid level, elevated white blood cell count on arrival. CTA scan of abdomen and pelvis revealed moderate left hydronephrosis secondary to UPJ obstruction from stone. He underwent nephrostomy tube placement on 04/02/2016. 2-D echocardiogram obtained on 04/03/2016 showed mild focal basal hypertrophy of the septum, EF 55-60%, no regional wall motion abnormalities. Due to elevated creatinine and low GFR, patient is not a candidate for NOAC. She converted to sinus rhythm since discharge.   She was doing well on cardiac stand point when seen by Dr. Johnsie Cancel 07/19/2016.  The patient having exertional chest tightness for the past 2 weeks. Initially felt as GERD. However this been getting worse. Now she able to walk only few stable before she gets chest tightness with dyspnea. This resolved with rest.  She is feeling fatigue, tired and "lightheaded". States something is wrong. Complains of palpitations. No orthopnea, PND, syncope or LE edema.   Past Medical History:  Diagnosis Date  . Arthritis   . Diabetes mellitus without complication ([place])   . Dysrhythmia    atrail fibrillation  . Glaucoma    bilat   . Hyperlipidemia   . Hypertension   . Kidney stone   . Urinary tract infection   . Wears glasses     Past Surgical History:  Procedure Laterality Date  . CYSTOSCOPY WITH RETROGRADE PYELOGRAM, URETEROSCOPY AND STENT PLACEMENT Left 06/03/2016   Procedure:  CYSTOSCOPY WITH LEFT RETROGRADE PYELOGRAM, URETEROSCOPY, REMOVAL OF LEFT NEPHROSTOMY TUBE, INSERTION OF DOUBLE J STENT, LEFT;  Surgeon: Carolan Clines, MD;  Location: [facility];  Service: Urology;  Laterality: Left;  . EYE SURGERY     cataract removed per left eye   . HOLMIUM LASER APPLICATION Left [id number]   Procedure: HOLMIUM LASER OF STONE ;  Surgeon: Carolan Clines, MD;  Location: [facility];  Service: Urology;  Laterality: Left;  . ROTATOR CUFF REPAIR      Current Medications: Prior to Admission medications   Medication Sig Start Date End Date Taking? Authorizing Provider  diltiazem (CARDIZEM CD) 240 MG 24 hr capsule Take 1 capsule (240 mg total) by mouth daily. 04/18/16   Almyra Deforest, PA  latanoprost (XALATAN) 0.005 % ophthalmic solution Place 1 drop into both eyes at bedtime.    Historical Provider, MD  predniSONE (DELTASONE) 5 MG tablet Take 5 mg by mouth daily with breakfast. Taking 1.5 tablets daily    Historical Provider, MD  rosuvastatin (CRESTOR) 20 MG tablet Take 20 mg by mouth daily.    Historical Provider, MD  warfarin (COUMADIN) 7.5 MG tablet Take as directed by [facility] 07/15/16   Josue Hector, MD    Allergies:   Patient has no known allergies.   Social History   Social History  . Marital status: Married    Spouse name: N/A  . Number of children: N/A  . Years of education: N/A   Social History Main Topics  . Smoking status: Never Smoker  .  Smokeless tobacco: Never Used  . Alcohol use No  . Drug use: No  . Sexual activity: Not on file   Other Topics Concern  . Not on file   Social History Narrative  . No narrative on file     Family History:  The patient's family history is not on file.   ROS:   Please see the history of present illness.    ROS All other systems reviewed and are negative.   PHYSICAL EXAM:   VS:  BP 140/90   Pulse 74   Resp 18   Wt 155 lb (70.3 kg)   SpO2 99%   BMI 26.61 kg/m    GEN: Well nourished, well developed, in no  acute distress  HEENT: normal  Neck: no JVD, carotid bruits, or masses Cardiac: RRR; no murmurs, rubs, or gallops,no edema  Respiratory:  clear to auscultation bilaterally, normal work of breathing GI: soft, nontender, nondistended, + BS MS: no deformity or atrophy  Skin: warm and dry, no rash Neuro:  Alert and Oriented x 3, Strength and sensation are intact Psych: euthymic mood, full affect  Wt Readings from Last 3 Encounters:  11/07/16 155 lb (70.3 kg)  07/19/16 159 lb (72.1 kg)  06/03/16 160 lb (72.6 kg)      Studies/Labs Reviewed:   EKG:  EKG is ordered today.  The ekg ordered today demonstrates NSR with PVCs and non specific T wave changes.  EKG from PCP 11/06/16 showed sinus rhythm with ventricular bigeminy (EKG came after patient left)   Recent Labs: 04/02/2016: ALT 19; TSH 0.352 04/07/2016: Magnesium 1.7 05/30/2016: BUN 15; Creatinine, Ser 1.29; Hemoglobin 11.6; Platelets 229; Potassium 4.6; Sodium 143   Lipid Panel    Component Value Date/Time   CHOL 178 04/04/2016 0327   TRIG 237 (H) 04/04/2016 0327   HDL 24 (L) 04/04/2016 0327   CHOLHDL 7.4 04/04/2016 0327   VLDL 47 (H) 04/04/2016 0327   LDLCALC 107 (H) 04/04/2016 0327    Additional studies/ records that were reviewed today include:   Echocardiogram: 04/03/16 Echo 04/03/2016 LV EF: 55% - 60%  ------------------------------------------------------------------- Indications: Atrial fibrillation - 427.31.  ------------------------------------------------------------------- History: PMH: Kidney Stones- Sepsis. Elevated Troponin&'s. Risk factors: Diabetes mellitus. Dyslipidemia.  ------------------------------------------------------------------- Study Conclusions  - Left ventricle: The cavity size was normal. There was mild focal  basal hypertrophy of the septum. Systolic function was normal.  The estimated ejection fraction was in the range of 55% to 60%.  Wall motion was normal; there  were no regional wall motion  abnormalities. Left ventricular diastolic function parameters  were normal. - Aortic valve: Trileaflet; moderately thickened, moderately  calcified leaflets. - Left atrium: The atrium was mildly dilated.    ASSESSMENT & PLAN:   1. Exertional chest tightness - Can not r/o anginal equivalent. Given recent fatigue, lightheadedness  will get get lexiscan. Start on imdur. Also get CBC, BMP.   2. PVCs - with palpitation. Will get monitor to r/o arrhythmia or see PVCs burden. May need BB in future.   3. PAF - Initial episode occurred in setting of acute pyelonephritis and obstructive uropathy. No re-occurance.  CHA2DS2-Vasc 5 (HTN, age, F sex, DM). Continue coumadin.   4. HTN - reasonable stable.   5. HLD - 04/04/2016: Cholesterol 178; HDL 24; LDL Cholesterol 107; Triglycerides 237; VLDL 47  - Continue statin   Medication Adjustments/Labs and Tests Ordered: Current medicines are reviewed at length with the patient today.  Concerns regarding medicines are outlined above.  Medication  changes, Labs and Tests ordered today are listed in the Patient Instructions below. Patient Instructions  Medication Instructions:  Your physician has recommended you make the following change in your medication:  1) START Imdur 30mg  daily. An Rx has been sent to your pharmacy 2) START Protonix 40mg  daily. An Rx has been sent to your pharmacy  Labwork: Troponin, Cbc, Bnp, Tsh today  Testing/Procedures: Your physician has requested that you have a lexiscan myoview. For further information please visit  [email or web address]. Please follow instruction sheet, as given.  Your physician has recommended that you wear a holter monitor. Holter monitors are medical devices that record the heart's electrical activity. Doctors most often use these monitors to diagnose arrhythmias. Arrhythmias are problems with the speed or rhythm of the heartbeat. The monitor is a small, portable  device. You can wear one while you do your normal daily activities. This is usually used to diagnose what is causing palpitations/syncope (passing out).      Follow-Up: Your physician recommends that you schedule a follow-up appointment in: 2 weeks with Robbie Lis, PA a day that Dr.Nishan is in the office   Any Other Special Instructions Will Be Listed Below (If Applicable).     If you need a refill on your cardiac medications before your next appointment, please call your pharmacy.      Jarrett Soho, [place]  11/07/2016 3:52 PM    [facility] [place], [place], [place]  [address] Phone: [phone]; Fax: [phone]

## 2016-11-07 ENCOUNTER — Encounter (INDEPENDENT_AMBULATORY_CARE_PROVIDER_SITE_OTHER): Payer: Self-pay

## 2016-11-07 ENCOUNTER — Ambulatory Visit (INDEPENDENT_AMBULATORY_CARE_PROVIDER_SITE_OTHER): Payer: Medicare Other | Admitting: Physician Assistant

## 2016-11-07 VITALS — BP 140/90 | HR 74 | Resp 18 | Wt 155.0 lb

## 2016-11-07 DIAGNOSIS — R002 Palpitations: Secondary | ICD-10-CM | POA: Diagnosis not present

## 2016-11-07 DIAGNOSIS — E7849 Other hyperlipidemia: Secondary | ICD-10-CM

## 2016-11-07 DIAGNOSIS — I480 Paroxysmal atrial fibrillation: Secondary | ICD-10-CM

## 2016-11-07 DIAGNOSIS — R079 Chest pain, unspecified: Secondary | ICD-10-CM | POA: Diagnosis not present

## 2016-11-07 DIAGNOSIS — E784 Other hyperlipidemia: Secondary | ICD-10-CM | POA: Diagnosis not present

## 2016-11-07 DIAGNOSIS — R5383 Other fatigue: Secondary | ICD-10-CM

## 2016-11-07 DIAGNOSIS — R0609 Other forms of dyspnea: Secondary | ICD-10-CM | POA: Diagnosis not present

## 2016-11-07 DIAGNOSIS — I10 Essential (primary) hypertension: Secondary | ICD-10-CM

## 2016-11-07 DIAGNOSIS — I493 Ventricular premature depolarization: Secondary | ICD-10-CM

## 2016-11-07 LAB — CBC WITH DIFFERENTIAL/PLATELET
BASOS PCT: 0 %
Basophils Absolute: 0 cells/uL (ref 0–200)
EOS ABS: 0 {cells}/uL — AB (ref 15–500)
Eosinophils Relative: 0 %
HEMATOCRIT: 37 % (ref 35.0–45.0)
Hemoglobin: 12.1 g/dL (ref 11.7–15.5)
Lymphocytes Relative: 11 %
Lymphs Abs: 1111 cells/uL (ref 850–3900)
MCH: 27.2 pg (ref 27.0–33.0)
MCHC: 32.7 g/dL (ref 32.0–36.0)
MCV: 83.1 fL (ref 80.0–100.0)
MONO ABS: 404 {cells}/uL (ref 200–950)
MPV: 10.2 fL (ref 7.5–12.5)
Monocytes Relative: 4 %
NEUTROS ABS: 8585 {cells}/uL — AB (ref 1500–7800)
Neutrophils Relative %: 85 %
PLATELETS: 216 10*3/uL (ref 140–400)
RBC: 4.45 MIL/uL (ref 3.80–5.10)
RDW: 16.9 % — ABNORMAL HIGH (ref 11.0–15.0)
WBC: 10.1 10*3/uL (ref 3.8–10.8)

## 2016-11-07 LAB — TSH: TSH: 0.4 m[IU]/L

## 2016-11-07 MED ORDER — ISOSORBIDE MONONITRATE ER 30 MG PO TB24: 30.0000 mg | ORAL_TABLET | Freq: Every day | ORAL | 5 refills | Status: DC

## 2016-11-07 MED ORDER — PANTOPRAZOLE SODIUM 40 MG PO TBEC: 40.0000 mg | DELAYED_RELEASE_TABLET | Freq: Every day | ORAL | 5 refills | Status: DC

## 2016-11-07 NOTE — Patient Instructions (Addendum)
Medication Instructions:  Your physician has recommended you make the following change in your medication:  1) START Imdur 30mg  daily. An Rx has been sent to your pharmacy 2) START Protonix 40mg  daily. An Rx has been sent to your pharmacy  Labwork: Troponin, Cbc, Bnp, Tsh today  Testing/Procedures: Your physician has requested that you have a lexiscan myoview. For further information please visit  [email or web address]. Please follow instruction sheet, as given.  Your physician has recommended that you wear a holter monitor. Holter monitors are medical devices that record the heart's electrical activity. Doctors most often use these monitors to diagnose arrhythmias. Arrhythmias are problems with the speed or rhythm of the heartbeat. The monitor is a small, portable device. You can wear one while you do your normal daily activities. This is usually used to diagnose what is causing palpitations/syncope (passing out).      Follow-Up: Your physician recommends that you schedule a follow-up appointment in: 2 weeks with Robbie Lis, PA a day that Dr.Nishan is in the office   Any Other Special Instructions Will Be Listed Below (If Applicable).     If you need a refill on your cardiac medications before your next appointment, please call your pharmacy.

## 2016-11-08 LAB — BRAIN NATRIURETIC PEPTIDE: Brain Natriuretic Peptide: 18.9 pg/mL (ref <100)

## 2016-11-08 LAB — TROPONIN I: Troponin I: 0.02 ng/mL (ref <=0.05)

## 2016-11-12 ENCOUNTER — Telehealth (HOSPITAL_COMMUNITY): Payer: Self-pay | Admitting: *Deleted

## 2016-11-12 NOTE — Telephone Encounter (Signed)
Left message on voicemail per DPR in reference to upcoming appointment scheduled on 11/14/16 at 0715 with detailed instructions given per Myocardial Perfusion Study Information Sheet for the test. LM to arrive 15 minutes early, and that it is imperative to arrive on time for appointment to keep from having the test rescheduled. If you need to cancel or reschedule your appointment, please call the office within 24 hours of your appointment. Failure to do so may result in a cancellation of your appointment, and a $50 no show fee. Phone number given for call back for any questions.

## 2016-11-14 ENCOUNTER — Ambulatory Visit (HOSPITAL_COMMUNITY): Payer: Medicare Other | Attending: Cardiovascular Disease

## 2016-11-14 ENCOUNTER — Ambulatory Visit (INDEPENDENT_AMBULATORY_CARE_PROVIDER_SITE_OTHER): Payer: Medicare Other | Admitting: *Deleted

## 2016-11-14 DIAGNOSIS — R079 Chest pain, unspecified: Secondary | ICD-10-CM | POA: Insufficient documentation

## 2016-11-14 DIAGNOSIS — I10 Essential (primary) hypertension: Secondary | ICD-10-CM | POA: Diagnosis not present

## 2016-11-14 DIAGNOSIS — E119 Type 2 diabetes mellitus without complications: Secondary | ICD-10-CM | POA: Insufficient documentation

## 2016-11-14 DIAGNOSIS — I4891 Unspecified atrial fibrillation: Secondary | ICD-10-CM

## 2016-11-14 LAB — MYOCARDIAL PERFUSION IMAGING
CHL CUP NUCLEAR SDS: 2
CHL CUP RESTING HR STRESS: 61 {beats}/min
LHR: 0.29
LV dias vol: 83 mL (ref 46–106)
LVSYSVOL: 28 mL
Peak HR: 76 {beats}/min
SRS: 2
SSS: 4
TID: 1.05

## 2016-11-14 LAB — POCT INR: INR: 3.4

## 2016-11-14 MED ORDER — TECHNETIUM TC 99M TETROFOSMIN IV KIT
10.8000 | PACK | Freq: Once | INTRAVENOUS | Status: AC | PRN
  Administered 2016-11-14: 10.8 via INTRAVENOUS
  Filled 2016-11-14: qty 11

## 2016-11-14 MED ORDER — REGADENOSON 0.4 MG/5ML IV SOLN
0.4000 mg | Freq: Once | INTRAVENOUS | Status: AC
  Administered 2016-11-14: 0.4 mg via INTRAVENOUS

## 2016-11-14 MED ORDER — TECHNETIUM TC 99M TETROFOSMIN IV KIT
31.3000 | PACK | Freq: Once | INTRAVENOUS | Status: AC | PRN
  Administered 2016-11-14: 31.3 via INTRAVENOUS
  Filled 2016-11-14: qty 32

## 2016-11-19 NOTE — Progress Notes (Addendum)
Cardiology Office Note    Date:  11/21/2016   ID:  DASHAI BIESE, DOB [birth year], MRN [id number]  PCP:  Elyn Peers, MD  Cardiologist:  Dr. Johnsie Cancel  Chief Complaint: Chest pain follow up  History of Present Illness:   Audrey Mullen is a 76 y.o. female Audrey Mullen is a 76 y.o. female HTN, HLD, DM, nephrolithiasis, and PAF who presented for chest pain follow up.   She was admitted at [facility] from 5/16-5/21 with sepsis secondary to acute pyelonephritis and obstructive uropathy. She was also found to be afib with RVR and elevated troponin. He had elevated lactic acid level, elevated white blood cell count on arrival. CTA scan of abdomen and pelvis revealed moderate left hydronephrosis secondary to UPJ obstruction from stone. He underwent nephrostomy tube placement on 04/02/2016. 2-D echocardiogram obtained on 04/03/2016 showed mild focal basal hypertrophy of the septum, EF 55-60%, no regional wall motion abnormalities. Due to elevated creatinine and low GFR, patient is not a candidate for NOAC. She converted to sinus rhythm since discharge.   Added to my schedule 11/07/16 for exertional chest pain. Also had palpitations. F/u labs were normal. Stress test was intermediate risk with reversible, medium-sized, mild basal-mid inferior and basal inferoseptal perfusion defect.  This is concerning for ischemia. Still pending 24 hours monitor.  Here today for follow up. Her chest pain has been improved but not completely resolved. Occurs only with extreme exertion and resolved with rest. Intermittent palpitations. No dyspnea, LE edema, orthopnea, PND, syncope, melena or blood in her stool.    Past Medical History:  Diagnosis Date  . Arthritis   . Diabetes mellitus without complication ([place])   . Dysrhythmia    atrail fibrillation  . Glaucoma    bilat   . Hyperlipidemia   . Hypertension   . Kidney stone   . Urinary tract infection   . Wears glasses      Past Surgical History:  Procedure Laterality Date  . CYSTOSCOPY WITH RETROGRADE PYELOGRAM, URETEROSCOPY AND STENT PLACEMENT Left 06/03/2016   Procedure: CYSTOSCOPY WITH LEFT RETROGRADE PYELOGRAM, URETEROSCOPY, REMOVAL OF LEFT NEPHROSTOMY TUBE, INSERTION OF DOUBLE J STENT, LEFT;  Surgeon: Carolan Clines, MD;  Location: [facility];  Service: Urology;  Laterality: Left;  . EYE SURGERY     cataract removed per left eye   . HOLMIUM LASER APPLICATION Left [id number]   Procedure: HOLMIUM LASER OF STONE ;  Surgeon: Carolan Clines, MD;  Location: [facility];  Service: Urology;  Laterality: Left;  . ROTATOR CUFF REPAIR      Current Medications: Prior to Admission medications   Medication Sig Start Date End Date Taking? Authorizing Provider  diltiazem (CARDIZEM CD) 240 MG 24 hr capsule Take 1 capsule (240 mg total) by mouth daily. 04/18/16   Almyra Deforest, PA  isosorbide mononitrate (IMDUR) 30 MG 24 hr tablet Take 1 tablet (30 mg total) by mouth daily. 11/07/16 02/05/17  Iniya Matzek, PA  latanoprost (XALATAN) 0.005 % ophthalmic solution Place 1 drop into both eyes at bedtime.    Historical Provider, MD  pantoprazole (PROTONIX) 40 MG tablet Take 1 tablet (40 mg total) by mouth daily. 11/07/16   Jalei Shibley, PA  predniSONE (DELTASONE) 5 MG tablet Take 5 mg by mouth daily with breakfast. Taking 1.5 tablets daily    Historical Provider, MD  rosuvastatin (CRESTOR) 20 MG tablet Take 20 mg by mouth daily.    Historical Provider, MD  warfarin (COUMADIN) 7.5 MG tablet Take as directed  by [facility] 07/15/16   Josue Hector, MD    Allergies:   Patient has no known allergies.   Social History   Social History  . Marital status: Married    Spouse name: N/A  . Number of children: N/A  . Years of education: N/A   Social History Main Topics  . Smoking status: Never Smoker  . Smokeless tobacco: Never Used  . Alcohol use No  . Drug use: No  . Sexual activity: Not on file   Other Topics  Concern  . Not on file   Social History Narrative  . No narrative on file     Family History:  The patient's family history is not on file.  ROS:   Please see the history of present illness.    ROS All other systems reviewed and are negative.   PHYSICAL EXAM:   VS:  BP 140/80   Pulse 78   Ht 5\' 4"  (1.626 m)   Wt 159 lb 6.4 oz (72.3 kg)   SpO2 95% Comment: at rest  BMI 27.36 kg/m    GEN: Well nourished, well developed, in no acute distress  HEENT: normal  Neck: no JVD, carotid bruits, or masses Cardiac: RRR; no murmurs, rubs, or gallops,no edema  Respiratory:  clear to auscultation bilaterally, normal work of breathing GI: soft, nontender, nondistended, + BS MS: no deformity or atrophy  Skin: warm and dry, no rash Neuro:  Alert and Oriented x 3, Strength and sensation are intact Psych: euthymic mood, full affect  Wt Readings from Last 3 Encounters:  11/21/16 159 lb 6.4 oz (72.3 kg)  11/14/16 155 lb (70.3 kg)  11/07/16 155 lb (70.3 kg)      Studies/Labs Reviewed:   EKG:  EKG is not ordered today.    Recent Labs: 04/02/2016: ALT 19 04/07/2016: Magnesium 1.7 05/30/2016: BUN 15; Creatinine, Ser 1.29; Potassium 4.6; Sodium 143 11/07/2016: Brain Natriuretic Peptide 18.9; Hemoglobin 12.1; Platelets 216; TSH 0.40   Lipid Panel    Component Value Date/Time   CHOL 178 04/04/2016 0327   TRIG 237 (H) 04/04/2016 0327   HDL 24 (L) 04/04/2016 0327   CHOLHDL 7.4 04/04/2016 0327   VLDL 47 (H) 04/04/2016 0327   LDLCALC 107 (H) 04/04/2016 0327    Additional studies/ records that were reviewed today include:   Echocardiogram: 04/03/16 Echo 04/03/2016 LV EF: 55% - 60%  ------------------------------------------------------------------- Indications: Atrial fibrillation - 427.31.  ------------------------------------------------------------------- History: PMH: Kidney Stones- Sepsis. Elevated Troponin&'s. Risk factors: Diabetes mellitus.  Dyslipidemia.  ------------------------------------------------------------------- Study Conclusions  - Left ventricle: The cavity size was normal. There was mild focal  basal hypertrophy of the septum. Systolic function was normal.  The estimated ejection fraction was in the range of 55% to 60%.  Wall motion was normal; there were no regional wall motion  abnormalities. Left ventricular diastolic function parameters  were normal. - Aortic valve: Trileaflet; moderately thickened, moderately  calcified leaflets. - Left atrium: The atrium was mildly dilated.  Myoivew: 11/14/16  Nuclear stress EF: 66%.  There was no ST segment deviation noted during stress.  Findings consistent with ischemia.  This is an intermediate risk study.  The left ventricular ejection fraction is hyperdynamic (>65%).   1. Reversible, medium-sized, mild basal-mid inferior and basal inferoseptal perfusion defect.  This is concerning for ischemia.  2. Normal LV systolic function.     ASSESSMENT & PLAN:    1. Exertional chest tightness - Lexiscan abnormal as above. Will plan for cath. Discussed  with Dr. Johnsie Cancel - hold coumadin 4 days before cath with no lovenox bridge needed. Discussed with Pharmacist [place]. She was adjust dose accordingly.  - Cath Monday. Hold coumadin starting today. Will start her on ASA 81mg  and coreg 3.125mg  BID. Might need to discontinue ASA pending cath.   The patient understands that risks include but are not limited to stroke (1 in 1000), death (1 in 36), kidney failure [usually temporary] (1 in 500), bleeding (1 in 200), allergic reaction [possibly serious] (1 in 200), and agrees to proceed.   2. Palpitaions/PVCs - Pending 24 hours holter  3. PAF - Initial episode occurred in setting of acute pyelonephritis and obstructive uropathy. No re-occurance.  CHA2DS2-Vasc 5 (HTN, age, F sex, DM). Continue coumadin. As above.   4. HTN - Today 140/80. Has been running high  at home as well. Will add Coreg 3.125mg  BID.   5. HLD - 04/04/2016: Cholesterol 178; HDL 24; LDL Cholesterol 107; Triglycerides 237; VLDL 47  - Continue Crestor 20mg  qd for now. If stent required, consider up-titration.     Medication Adjustments/Labs and Tests Ordered: Current medicines are reviewed at length with the patient today.  Concerns regarding medicines are outlined above.  Medication changes, Labs and Tests ordered today are listed in the Patient Instructions below. Patient Instructions  Medication Instructions:  Your physician has recommended you make the following change in your medication:  1. Start Coreg (3.125 mg ) twice a day 2. Start Asprin (81 mg) daily 3. Start Nitroglycerin (0.4 mg ) sublingual, Take up to three times every five minutes for chest pain if no relief call 911 All medications sent in today to patient's requested pharmacy.    Labwork: Your physician recommends that you have lab work today: bmet/cbc/ptt/pt/inr.   Testing/Procedures: Your physician has requested that you have a cardiac catheterization. Cardiac catheterization is used to diagnose and/or treat various heart conditions. Doctors may recommend this procedure for a number of different reasons. The most common reason is to evaluate chest pain. Chest pain can be a symptom of coronary artery disease (CAD), and cardiac catheterization can show whether plaque is narrowing or blocking your heart's arteries. This procedure is also used to evaluate the valves, as well as measure the blood flow and oxygen levels in different parts of your heart. For further information please visit [email or web address]. Please follow instruction sheet, as given.    Follow-Up: Your physician recommends that you keep scheduled  follow-up appointment in two weeks with Robbie Lis, PA.   Any Other Special Instructions Will Be Listed Below (If Applicable).  Your provider has recommended a cardiac catherization  You are  scheduled for a cardiac catheterization on Monday, January 8 with Dr. Burt Knack or associate.  Please arrive at the [facility] (Main Entrance) at [facility] at [address], [place] Stay on Monday, January 8 at 5:30 am.    Special note: Every effort is made to have your procedure done on time.   Please understand that emergencies sometimes delay a scheduled   procedure.  No food or drink after midnight on Sunday, January 7.   You may take your morning medications with a sip of water on the day of your procedure.  Medications to HOLD - coumadin starting tonight until after your procedure.   Plan for a one night stay -- bring personal belongings.  Bring a current list of your medications and current insurance cards.  You MUST have a responsible person  to drive you home. Someone MUST be with you the first 24 hours after you arrive home or your discharge will be delayed. Wear clothes that are easy to get on and off and wear slip on shoes.    Coronary Angiogram A coronary angiogram, also called coronary angiography, is an X-ray procedure used to look at the arteries in the heart. In this procedure, a dye (contrast dye) is injected through a long, hollow tube (catheter). The catheter is about the size of a piece of cooked spaghetti and is inserted through your groin, wrist, or arm. The dye is injected into each artery, and X-rays are then taken to show if there is a blockage in the arteries of your heart.  LET [facility] CARE PROVIDER KNOW ABOUT:  Any allergies you have, including allergies to shellfish or contrast dye.   All medicines you are taking, including vitamins, herbs, eye drops, creams, and over-the-counter medicines.   Previous problems you or members of your family have had with the use of anesthetics.   Any blood disorders you have.   Previous surgeries you have had.  History of kidney problems or failure.   Other medical conditions you  have.  RISKS AND COMPLICATIONS  Generally, a coronary angiogram is a safe procedure. However, about 1 person out of 1000 can have problems that may include:  Allergic reaction to the dye.  Bleeding/bruising from the access site or other locations.  Kidney injury, especially in people with impaired kidney function.  Stroke (rare).  Heart attack (rare).  Irregular rhythms (rare)  Death (rare)  BEFORE THE PROCEDURE   Do not eat or drink anything after midnight the night before the procedure or as directed by your health care provider.   Ask your health care provider about changing or stopping your regular medicines. This is especially important if you are taking diabetes medicines or blood thinners.  PROCEDURE  You may be given a medicine to help you relax (sedative) before the procedure. This medicine is given through an intravenous (IV) access tube that is inserted into one of your veins.   The area where the catheter will be inserted will be washed and shaved. This is usually done in the groin but may be done in the fold of your arm (near your elbow) or in the wrist.   A medicine will be given to numb the area where the catheter will be inserted (local anesthetic).   The health care provider will insert the catheter into an artery. The catheter will be guided by using a special type of X-ray (fluoroscopy) of the blood vessel being examined.   A special dye will then be injected into the catheter, and X-rays will be taken. The dye will help to show where any narrowing or blockages are located in the heart arteries.    AFTER THE PROCEDURE   If the procedure is done through the leg, you will be kept in bed lying flat for several hours. You will be instructed to not bend or cross your legs.  The insertion site will be checked frequently.   The pulse in your feet or wrist will be checked frequently.   Additional blood tests, X-rays, and an electrocardiogram may be  done.       If you need a refill on your cardiac medications before your next appointment, please call your pharmacy.      Jarrett Soho, PA  11/21/2016 8:33 AM    [facility]  [address], [place], [place]  [address] Phone: [phone]; Fax: [phone]

## 2016-11-20 ENCOUNTER — Other Ambulatory Visit: Payer: Self-pay | Admitting: Physician Assistant

## 2016-11-20 DIAGNOSIS — I480 Paroxysmal atrial fibrillation: Secondary | ICD-10-CM

## 2016-11-20 DIAGNOSIS — R002 Palpitations: Secondary | ICD-10-CM

## 2016-11-20 DIAGNOSIS — R42 Dizziness and giddiness: Secondary | ICD-10-CM

## 2016-11-20 DIAGNOSIS — I493 Ventricular premature depolarization: Secondary | ICD-10-CM

## 2016-11-21 ENCOUNTER — Ambulatory Visit (INDEPENDENT_AMBULATORY_CARE_PROVIDER_SITE_OTHER): Payer: Medicare Other | Admitting: Pharmacist

## 2016-11-21 ENCOUNTER — Ambulatory Visit (INDEPENDENT_AMBULATORY_CARE_PROVIDER_SITE_OTHER): Payer: Medicare Other | Admitting: Physician Assistant

## 2016-11-21 ENCOUNTER — Ambulatory Visit (INDEPENDENT_AMBULATORY_CARE_PROVIDER_SITE_OTHER): Payer: Medicare Other

## 2016-11-21 VITALS — BP 140/80 | HR 78 | Ht 64.0 in | Wt 159.4 lb

## 2016-11-21 DIAGNOSIS — I4891 Unspecified atrial fibrillation: Secondary | ICD-10-CM | POA: Diagnosis not present

## 2016-11-21 DIAGNOSIS — E7849 Other hyperlipidemia: Secondary | ICD-10-CM

## 2016-11-21 DIAGNOSIS — I480 Paroxysmal atrial fibrillation: Secondary | ICD-10-CM

## 2016-11-21 DIAGNOSIS — R9439 Abnormal result of other cardiovascular function study: Secondary | ICD-10-CM | POA: Diagnosis not present

## 2016-11-21 DIAGNOSIS — I493 Ventricular premature depolarization: Secondary | ICD-10-CM | POA: Diagnosis not present

## 2016-11-21 DIAGNOSIS — I10 Essential (primary) hypertension: Secondary | ICD-10-CM | POA: Diagnosis not present

## 2016-11-21 DIAGNOSIS — R42 Dizziness and giddiness: Secondary | ICD-10-CM | POA: Diagnosis not present

## 2016-11-21 DIAGNOSIS — E784 Other hyperlipidemia: Secondary | ICD-10-CM | POA: Diagnosis not present

## 2016-11-21 DIAGNOSIS — I209 Angina pectoris, unspecified: Secondary | ICD-10-CM

## 2016-11-21 DIAGNOSIS — R002 Palpitations: Secondary | ICD-10-CM | POA: Diagnosis not present

## 2016-11-21 LAB — POCT INR: INR: 1.8

## 2016-11-21 MED ORDER — ASPIRIN EC 81 MG PO TBEC: 81.0000 mg | DELAYED_RELEASE_TABLET | Freq: Every day | ORAL | 3 refills | Status: AC

## 2016-11-21 MED ORDER — NITROGLYCERIN 0.4 MG SL SUBL: 0.4000 mg | SUBLINGUAL_TABLET | SUBLINGUAL | 3 refills | Status: DC | PRN

## 2016-11-21 MED ORDER — CARVEDILOL 3.125 MG PO TABS: 3.1250 mg | ORAL_TABLET | Freq: Two times a day (BID) | ORAL | 9 refills | Status: DC

## 2016-11-21 NOTE — Patient Instructions (Signed)
Medication Instructions:  Your physician has recommended you make the following change in your medication:  1. Start Coreg (3.125 mg ) twice a day 2. Start Asprin (81 mg) daily 3. Start Nitroglycerin (0.4 mg ) sublingual, Take up to three times every five minutes for chest pain if no relief call 911 All medications sent in today to patient's requested pharmacy.    Labwork: Your physician recommends that you have lab work today: bmet/cbc/ptt/pt/inr.   Testing/Procedures: Your physician has requested that you have a cardiac catheterization. Cardiac catheterization is used to diagnose and/or treat various heart conditions. Doctors may recommend this procedure for a number of different reasons. The most common reason is to evaluate chest pain. Chest pain can be a symptom of coronary artery disease (CAD), and cardiac catheterization can show whether plaque is narrowing or blocking your heart's arteries. This procedure is also used to evaluate the valves, as well as measure the blood flow and oxygen levels in different parts of your heart. For further information please visit [email or web address]. Please follow instruction sheet, as given.    Follow-Up: Your physician recommends that you keep scheduled  follow-up appointment in two weeks with Robbie Lis, PA.   Any Other Special Instructions Will Be Listed Below (If Applicable).  Your provider has recommended a cardiac catherization  You are scheduled for a cardiac catheterization on Monday, January 8 with Dr. Burt Knack or associate.  Please arrive at the [facility] (Main Entrance) at [facility] at [address] [place] on Monday, January 8 at 5:30 am.    Special note: Every effort is made to have your procedure done on time.   Please understand that emergencies sometimes delay a scheduled   procedure.  No food or drink after midnight on Sunday, January 7.   You may take your morning medications with a sip  of water on the day of your procedure.  Medications to HOLD - coumadin starting tonight until after your procedure.   Plan for a one night stay -- bring personal belongings.  Bring a current list of your medications and current insurance cards.  You MUST have a responsible person to drive you home. Someone MUST be with you the first 24 hours after you arrive home or your discharge will be delayed. Wear clothes that are easy to get on and off and wear slip on shoes.    Coronary Angiogram A coronary angiogram, also called coronary angiography, is an X-ray procedure used to look at the arteries in the heart. In this procedure, a dye (contrast dye) is injected through a long, hollow tube (catheter). The catheter is about the size of a piece of cooked spaghetti and is inserted through your groin, wrist, or arm. The dye is injected into each artery, and X-rays are then taken to show if there is a blockage in the arteries of your heart.  LET [facility] CARE PROVIDER KNOW ABOUT:  Any allergies you have, including allergies to shellfish or contrast dye.   All medicines you are taking, including vitamins, herbs, eye drops, creams, and over-the-counter medicines.   Previous problems you or members of your family have had with the use of anesthetics.   Any blood disorders you have.   Previous surgeries you have had.  History of kidney problems or failure.   Other medical conditions you have.  RISKS AND COMPLICATIONS  Generally, a coronary angiogram is a safe procedure. However, about 1 person out of 1000  can have problems that may include:  Allergic reaction to the dye.  Bleeding/bruising from the access site or other locations.  Kidney injury, especially in people with impaired kidney function.  Stroke (rare).  Heart attack (rare).  Irregular rhythms (rare)  Death (rare)  BEFORE THE PROCEDURE   Do not eat or drink anything after midnight the night before the procedure or  as directed by your health care provider.   Ask your health care provider about changing or stopping your regular medicines. This is especially important if you are taking diabetes medicines or blood thinners.  PROCEDURE  You may be given a medicine to help you relax (sedative) before the procedure. This medicine is given through an intravenous (IV) access tube that is inserted into one of your veins.   The area where the catheter will be inserted will be washed and shaved. This is usually done in the groin but may be done in the fold of your arm (near your elbow) or in the wrist.   A medicine will be given to numb the area where the catheter will be inserted (local anesthetic).   The health care provider will insert the catheter into an artery. The catheter will be guided by using a special type of X-ray (fluoroscopy) of the blood vessel being examined.   A special dye will then be injected into the catheter, and X-rays will be taken. The dye will help to show where any narrowing or blockages are located in the heart arteries.    AFTER THE PROCEDURE   If the procedure is done through the leg, you will be kept in bed lying flat for several hours. You will be instructed to not bend or cross your legs.  The insertion site will be checked frequently.   The pulse in your feet or wrist will be checked frequently.   Additional blood tests, X-rays, and an electrocardiogram may be done.       If you need a refill on your cardiac medications before your next appointment, please call your pharmacy.

## 2016-11-22 LAB — CBC WITH DIFFERENTIAL/PLATELET
BASOS ABS: 0 10*3/uL (ref 0.0–0.2)
Basos: 0 %
EOS (ABSOLUTE): 0 10*3/uL (ref 0.0–0.4)
Eos: 0 %
Hematocrit: 33.8 % — ABNORMAL LOW (ref 34.0–46.6)
Hemoglobin: 10.9 g/dL — ABNORMAL LOW (ref 11.1–15.9)
IMMATURE GRANS (ABS): 0 10*3/uL (ref 0.0–0.1)
Immature Granulocytes: 1 %
LYMPHS: 20 %
Lymphocytes Absolute: 1.4 10*3/uL (ref 0.7–3.1)
MCH: 26.9 pg (ref 26.6–33.0)
MCHC: 32.2 g/dL (ref 31.5–35.7)
MCV: 84 fL (ref 79–97)
Monocytes Absolute: 0.6 10*3/uL (ref 0.1–0.9)
Monocytes: 9 %
Neutrophils Absolute: 4.7 10*3/uL (ref 1.4–7.0)
Neutrophils: 70 %
Platelets: 181 10*3/uL (ref 150–379)
RBC: 4.05 x10E6/uL (ref 3.77–5.28)
RDW: 16.8 % — ABNORMAL HIGH (ref 12.3–15.4)
WBC: 6.8 10*3/uL (ref 3.4–10.8)

## 2016-11-22 LAB — BASIC METABOLIC PANEL
BUN/Creatinine Ratio: 14 (ref 12–28)
BUN: 25 mg/dL (ref 8–27)
CALCIUM: 9 mg/dL (ref 8.7–10.3)
CHLORIDE: 100 mmol/L (ref 96–106)
CO2: 23 mmol/L (ref 18–29)
Creatinine, Ser: 1.78 mg/dL — ABNORMAL HIGH (ref 0.57–1.00)
GFR calc non Af Amer: 27 mL/min/{1.73_m2} — ABNORMAL LOW (ref >59)
GFR, EST AFRICAN AMERICAN: 31 mL/min/{1.73_m2} — AB (ref >59)
Glucose: 159 mg/dL — ABNORMAL HIGH (ref 65–99)
Potassium: 4.7 mmol/L (ref 3.5–5.2)
Sodium: 141 mmol/L (ref 134–144)

## 2016-11-22 LAB — PROTIME-INR
INR: 1.7 — AB (ref 0.8–1.2)
Prothrombin Time: 17 s — ABNORMAL HIGH (ref 9.1–12.0)

## 2016-11-22 LAB — APTT: APTT: 30 s (ref 24–33)

## 2016-11-25 ENCOUNTER — Encounter (HOSPITAL_COMMUNITY): Payer: Self-pay | Admitting: *Deleted

## 2016-11-25 ENCOUNTER — Inpatient Hospital Stay (HOSPITAL_COMMUNITY)
Admission: AD | Admit: 2016-11-25 | Discharge: 2016-12-09 | DRG: 228 | Disposition: A | Discharge status: Home Health | Payer: Medicare Other | Source: Ambulatory Visit | Attending: Thoracic Surgery (Cardiothoracic Vascular Surgery) | Admitting: Thoracic Surgery (Cardiothoracic Vascular Surgery)

## 2016-11-25 ENCOUNTER — Encounter (HOSPITAL_COMMUNITY)
Admission: AD | Disposition: A | Discharge status: Home Health | Payer: Self-pay | Source: Ambulatory Visit | Attending: Thoracic Surgery (Cardiothoracic Vascular Surgery)

## 2016-11-25 ENCOUNTER — Other Ambulatory Visit: Payer: Self-pay | Admitting: *Deleted

## 2016-11-25 DIAGNOSIS — K219 Gastro-esophageal reflux disease without esophagitis: Secondary | ICD-10-CM | POA: Diagnosis not present

## 2016-11-25 DIAGNOSIS — R4701 Aphasia: Secondary | ICD-10-CM

## 2016-11-25 DIAGNOSIS — N179 Acute kidney failure, unspecified: Secondary | ICD-10-CM | POA: Diagnosis not present

## 2016-11-25 DIAGNOSIS — I471 Supraventricular tachycardia: Secondary | ICD-10-CM | POA: Diagnosis not present

## 2016-11-25 DIAGNOSIS — I129 Hypertensive chronic kidney disease with stage 1 through stage 4 chronic kidney disease, or unspecified chronic kidney disease: Secondary | ICD-10-CM | POA: Diagnosis present

## 2016-11-25 DIAGNOSIS — Z87442 Personal history of urinary calculi: Secondary | ICD-10-CM

## 2016-11-25 DIAGNOSIS — Z951 Presence of aortocoronary bypass graft: Secondary | ICD-10-CM

## 2016-11-25 DIAGNOSIS — I6529 Occlusion and stenosis of unspecified carotid artery: Secondary | ICD-10-CM | POA: Diagnosis not present

## 2016-11-25 DIAGNOSIS — D62 Acute posthemorrhagic anemia: Secondary | ICD-10-CM | POA: Diagnosis not present

## 2016-11-25 DIAGNOSIS — R079 Chest pain, unspecified: Secondary | ICD-10-CM | POA: Diagnosis not present

## 2016-11-25 DIAGNOSIS — I9789 Other postprocedural complications and disorders of the circulatory system, not elsewhere classified: Secondary | ICD-10-CM | POA: Diagnosis not present

## 2016-11-25 DIAGNOSIS — G8191 Hemiplegia, unspecified affecting right dominant side: Secondary | ICD-10-CM | POA: Diagnosis not present

## 2016-11-25 DIAGNOSIS — I639 Cerebral infarction, unspecified: Secondary | ICD-10-CM | POA: Diagnosis not present

## 2016-11-25 DIAGNOSIS — E8770 Fluid overload, unspecified: Secondary | ICD-10-CM | POA: Diagnosis present

## 2016-11-25 DIAGNOSIS — E785 Hyperlipidemia, unspecified: Secondary | ICD-10-CM | POA: Diagnosis present

## 2016-11-25 DIAGNOSIS — I25119 Atherosclerotic heart disease of native coronary artery with unspecified angina pectoris: Secondary | ICD-10-CM | POA: Diagnosis not present

## 2016-11-25 DIAGNOSIS — Z6827 Body mass index (BMI) 27.0-27.9, adult: Secondary | ICD-10-CM

## 2016-11-25 DIAGNOSIS — I2089 Other forms of angina pectoris: Secondary | ICD-10-CM

## 2016-11-25 DIAGNOSIS — I959 Hypotension, unspecified: Secondary | ICD-10-CM | POA: Diagnosis not present

## 2016-11-25 DIAGNOSIS — I482 Chronic atrial fibrillation: Secondary | ICD-10-CM | POA: Diagnosis present

## 2016-11-25 DIAGNOSIS — R471 Dysarthria and anarthria: Secondary | ICD-10-CM | POA: Diagnosis not present

## 2016-11-25 DIAGNOSIS — R1310 Dysphagia, unspecified: Secondary | ICD-10-CM | POA: Diagnosis not present

## 2016-11-25 DIAGNOSIS — R262 Difficulty in walking, not elsewhere classified: Secondary | ICD-10-CM

## 2016-11-25 DIAGNOSIS — I25118 Atherosclerotic heart disease of native coronary artery with other forms of angina pectoris: Secondary | ICD-10-CM

## 2016-11-25 DIAGNOSIS — N183 Chronic kidney disease, stage 3 (moderate): Secondary | ICD-10-CM | POA: Diagnosis present

## 2016-11-25 DIAGNOSIS — I6522 Occlusion and stenosis of left carotid artery: Secondary | ICD-10-CM | POA: Diagnosis not present

## 2016-11-25 DIAGNOSIS — I481 Persistent atrial fibrillation: Secondary | ICD-10-CM | POA: Diagnosis not present

## 2016-11-25 DIAGNOSIS — I25110 Atherosclerotic heart disease of native coronary artery with unstable angina pectoris: Principal | ICD-10-CM | POA: Diagnosis present

## 2016-11-25 DIAGNOSIS — R9439 Abnormal result of other cardiovascular function study: Secondary | ICD-10-CM

## 2016-11-25 DIAGNOSIS — E1122 Type 2 diabetes mellitus with diabetic chronic kidney disease: Secondary | ICD-10-CM | POA: Diagnosis not present

## 2016-11-25 DIAGNOSIS — I6340 Cerebral infarction due to embolism of unspecified cerebral artery: Secondary | ICD-10-CM | POA: Diagnosis not present

## 2016-11-25 DIAGNOSIS — A419 Sepsis, unspecified organism: Secondary | ICD-10-CM | POA: Diagnosis not present

## 2016-11-25 DIAGNOSIS — Z79899 Other long term (current) drug therapy: Secondary | ICD-10-CM

## 2016-11-25 DIAGNOSIS — R935 Abnormal findings on diagnostic imaging of other abdominal regions, including retroperitoneum: Secondary | ICD-10-CM | POA: Diagnosis not present

## 2016-11-25 DIAGNOSIS — I208 Other forms of angina pectoris: Secondary | ICD-10-CM

## 2016-11-25 DIAGNOSIS — I69320 Aphasia following cerebral infarction: Secondary | ICD-10-CM

## 2016-11-25 DIAGNOSIS — H409 Unspecified glaucoma: Secondary | ICD-10-CM | POA: Diagnosis not present

## 2016-11-25 DIAGNOSIS — I480 Paroxysmal atrial fibrillation: Secondary | ICD-10-CM | POA: Diagnosis not present

## 2016-11-25 DIAGNOSIS — Z7952 Long term (current) use of systemic steroids: Secondary | ICD-10-CM

## 2016-11-25 DIAGNOSIS — E663 Overweight: Secondary | ICD-10-CM | POA: Diagnosis present

## 2016-11-25 DIAGNOSIS — Z01810 Encounter for preprocedural cardiovascular examination: Secondary | ICD-10-CM | POA: Diagnosis not present

## 2016-11-25 DIAGNOSIS — R7881 Bacteremia: Secondary | ICD-10-CM | POA: Diagnosis not present

## 2016-11-25 DIAGNOSIS — J90 Pleural effusion, not elsewhere classified: Secondary | ICD-10-CM | POA: Diagnosis not present

## 2016-11-25 DIAGNOSIS — R29818 Other symptoms and signs involving the nervous system: Secondary | ICD-10-CM | POA: Diagnosis not present

## 2016-11-25 DIAGNOSIS — I2583 Coronary atherosclerosis due to lipid rich plaque: Secondary | ICD-10-CM | POA: Diagnosis not present

## 2016-11-25 DIAGNOSIS — I493 Ventricular premature depolarization: Secondary | ICD-10-CM | POA: Diagnosis not present

## 2016-11-25 DIAGNOSIS — I63412 Cerebral infarction due to embolism of left middle cerebral artery: Secondary | ICD-10-CM | POA: Diagnosis not present

## 2016-11-25 DIAGNOSIS — I4891 Unspecified atrial fibrillation: Secondary | ICD-10-CM | POA: Diagnosis not present

## 2016-11-25 DIAGNOSIS — I2510 Atherosclerotic heart disease of native coronary artery without angina pectoris: Secondary | ICD-10-CM

## 2016-11-25 DIAGNOSIS — I200 Unstable angina: Secondary | ICD-10-CM | POA: Diagnosis not present

## 2016-11-25 DIAGNOSIS — Z7901 Long term (current) use of anticoagulants: Secondary | ICD-10-CM

## 2016-11-25 DIAGNOSIS — R531 Weakness: Secondary | ICD-10-CM

## 2016-11-25 DIAGNOSIS — J9811 Atelectasis: Secondary | ICD-10-CM

## 2016-11-25 HISTORY — DX: Gastro-esophageal reflux disease without esophagitis: K21.9

## 2016-11-25 HISTORY — DX: Angina pectoris, unspecified: I20.9

## 2016-11-25 HISTORY — PX: CARDIAC CATHETERIZATION: SHX172

## 2016-11-25 HISTORY — DX: Atherosclerotic heart disease of native coronary artery without angina pectoris: I25.10

## 2016-11-25 LAB — GLUCOSE, CAPILLARY
GLUCOSE-CAPILLARY: 127 mg/dL — AB (ref 65–99)
GLUCOSE-CAPILLARY: 190 mg/dL — AB (ref 65–99)
Glucose-Capillary: 209 mg/dL — ABNORMAL HIGH (ref 65–99)

## 2016-11-25 LAB — BASIC METABOLIC PANEL
Anion gap: 8 (ref 5–15)
BUN: 22 mg/dL — ABNORMAL HIGH (ref 6–20)
CHLORIDE: 105 mmol/L (ref 101–111)
CO2: 26 mmol/L (ref 22–32)
CREATININE: 1.42 mg/dL — AB (ref 0.44–1.00)
Calcium: 9.2 mg/dL (ref 8.9–10.3)
GFR calc non Af Amer: 35 mL/min — ABNORMAL LOW (ref >60)
GFR, EST AFRICAN AMERICAN: 40 mL/min — AB (ref >60)
GLUCOSE: 131 mg/dL — AB (ref 65–99)
Potassium: 4.9 mmol/L (ref 3.5–5.1)
Sodium: 139 mmol/L (ref 135–145)

## 2016-11-25 LAB — PROTIME-INR
INR: 1.25
Prothrombin Time: 15.8 seconds — ABNORMAL HIGH (ref 11.4–15.2)

## 2016-11-25 SURGERY — LEFT HEART CATH AND CORONARY ANGIOGRAPHY

## 2016-11-25 MED ORDER — SPIRONOLACTONE 25 MG PO TABS
25.0000 mg | ORAL_TABLET | Freq: Every day | ORAL | Status: DC
  Administered 2016-11-25 – 2016-11-27 (×3): 25 mg via ORAL
  Filled 2016-11-25 (×3): qty 1

## 2016-11-25 MED ORDER — INSULIN ASPART 100 UNIT/ML ~~LOC~~ SOLN
0.0000 [IU] | Freq: Three times a day (TID) | SUBCUTANEOUS | Status: DC
  Administered 2016-11-26 (×2): 3 [IU] via SUBCUTANEOUS
  Administered 2016-11-26: 2 [IU] via SUBCUTANEOUS
  Administered 2016-11-27: 12:00:00 3 [IU] via SUBCUTANEOUS
  Administered 2016-11-27: 2 [IU] via SUBCUTANEOUS
  Administered 2016-11-27: 3 [IU] via SUBCUTANEOUS

## 2016-11-25 MED ORDER — SODIUM CHLORIDE 0.9% FLUSH: 3.0000 mL | Freq: Two times a day (BID) | INTRAVENOUS | Status: DC

## 2016-11-25 MED ORDER — LIDOCAINE HCL (PF) 1 % IJ SOLN
INTRAMUSCULAR | Status: AC
  Filled 2016-11-25: qty 30

## 2016-11-25 MED ORDER — SODIUM CHLORIDE 0.9 % IV SOLN
INTRAVENOUS | Status: AC
  Administered 2016-11-25: 18:00:00 via INTRAVENOUS

## 2016-11-25 MED ORDER — HEPARIN (PORCINE) IN NACL 100-0.45 UNIT/ML-% IJ SOLN
750.0000 [IU]/h | INTRAMUSCULAR | Status: DC
  Administered 2016-11-25: 800 [IU]/h via INTRAVENOUS
  Filled 2016-11-25: qty 250

## 2016-11-25 MED ORDER — CARVEDILOL 3.125 MG PO TABS
3.1250 mg | ORAL_TABLET | Freq: Two times a day (BID) | ORAL | Status: DC
  Administered 2016-11-25 – 2016-11-27 (×5): 3.125 mg via ORAL
  Filled 2016-11-25 (×5): qty 1

## 2016-11-25 MED ORDER — HYDRALAZINE HCL 20 MG/ML IJ SOLN: 10.0000 mg | INTRAMUSCULAR | Status: DC | PRN

## 2016-11-25 MED ORDER — SODIUM BICARBONATE 8.4 % IV SOLN
INTRAVENOUS | Status: AC
  Administered 2016-11-25: 11:00:00 via INTRAVENOUS
  Filled 2016-11-25: qty 1000

## 2016-11-25 MED ORDER — SODIUM CHLORIDE 0.9 % IV SOLN: 250.0000 mL | INTRAVENOUS | Status: DC | PRN

## 2016-11-25 MED ORDER — PREDNISONE 5 MG PO TABS
7.5000 mg | ORAL_TABLET | Freq: Every day | ORAL | Status: DC
  Administered 2016-11-26 – 2016-11-27 (×2): 7.5 mg via ORAL
  Filled 2016-11-25 (×2): qty 2

## 2016-11-25 MED ORDER — HEPARIN SODIUM (PORCINE) 1000 UNIT/ML IJ SOLN
INTRAMUSCULAR | Status: DC | PRN
Start: 2016-11-25 — End: 2016-11-25

## 2016-11-25 MED ORDER — HEPARIN (PORCINE) IN NACL 2-0.9 UNIT/ML-% IJ SOLN
INTRAMUSCULAR | Status: AC
  Filled 2016-11-25: qty 1000

## 2016-11-25 MED ORDER — SODIUM CHLORIDE 0.9 % IV SOLN
INTRAVENOUS | Status: DC
  Administered 2016-11-25 (×2): via INTRAVENOUS

## 2016-11-25 MED ORDER — SODIUM CHLORIDE 0.9% FLUSH
3.0000 mL | Freq: Two times a day (BID) | INTRAVENOUS | Status: DC
  Administered 2016-11-26 – 2016-11-27 (×4): 3 mL via INTRAVENOUS

## 2016-11-25 MED ORDER — ASPIRIN 81 MG PO CHEW: 81.0000 mg | CHEWABLE_TABLET | Freq: Every day | ORAL | Status: DC

## 2016-11-25 MED ORDER — DILTIAZEM HCL ER COATED BEADS 240 MG PO CP24
240.0000 mg | ORAL_CAPSULE | Freq: Every day | ORAL | Status: DC
  Administered 2016-11-26 – 2016-11-27 (×2): 240 mg via ORAL
  Filled 2016-11-25 (×2): qty 1

## 2016-11-25 MED ORDER — LIDOCAINE HCL (PF) 1 % IJ SOLN
INTRAMUSCULAR | Status: DC | PRN
  Administered 2016-11-25: 2 mL
  Administered 2016-11-25: 15 mL

## 2016-11-25 MED ORDER — IOPAMIDOL (ISOVUE-370) INJECTION 76%
INTRAVENOUS | Status: DC | PRN
  Administered 2016-11-25: 55 mL via INTRA_ARTERIAL

## 2016-11-25 MED ORDER — NITROGLYCERIN 0.4 MG SL SUBL: 0.4000 mg | SUBLINGUAL_TABLET | SUBLINGUAL | Status: DC | PRN

## 2016-11-25 MED ORDER — ONDANSETRON HCL 4 MG/2ML IJ SOLN: 4.0000 mg | Freq: Four times a day (QID) | INTRAMUSCULAR | Status: DC | PRN

## 2016-11-25 MED ORDER — HEPARIN (PORCINE) IN NACL 2-0.9 UNIT/ML-% IJ SOLN
INTRAMUSCULAR | Status: DC | PRN
  Administered 2016-11-25: 11:00:00 via INTRA_ARTERIAL

## 2016-11-25 MED ORDER — HEPARIN SODIUM (PORCINE) 1000 UNIT/ML IJ SOLN
INTRAMUSCULAR | Status: AC
  Filled 2016-11-25: qty 1

## 2016-11-25 MED ORDER — HEPARIN (PORCINE) IN NACL 2-0.9 UNIT/ML-% IJ SOLN
INTRAMUSCULAR | Status: DC | PRN
  Administered 2016-11-25: 1000 mL

## 2016-11-25 MED ORDER — NITROGLYCERIN 1 MG/10 ML FOR IR/CATH LAB
INTRA_ARTERIAL | Status: AC
  Filled 2016-11-25: qty 10

## 2016-11-25 MED ORDER — ASPIRIN EC 81 MG PO TBEC
81.0000 mg | DELAYED_RELEASE_TABLET | Freq: Every day | ORAL | Status: DC
  Administered 2016-11-26 – 2016-11-27 (×2): 81 mg via ORAL
  Filled 2016-11-25 (×2): qty 1

## 2016-11-25 MED ORDER — MORPHINE SULFATE (PF) 2 MG/ML IV SOLN: 2.0000 mg | INTRAVENOUS | Status: DC | PRN

## 2016-11-25 MED ORDER — ACETAMINOPHEN 325 MG PO TABS: 650.0000 mg | ORAL_TABLET | ORAL | Status: DC | PRN

## 2016-11-25 MED ORDER — LATANOPROST 0.005 % OP SOLN
1.0000 [drp] | Freq: Every day | OPHTHALMIC | Status: DC
  Administered 2016-11-25 – 2016-11-27 (×3): 1 [drp] via OPHTHALMIC
  Filled 2016-11-25: qty 2.5

## 2016-11-25 MED ORDER — IOPAMIDOL (ISOVUE-370) INJECTION 76%
INTRAVENOUS | Status: AC
  Filled 2016-11-25: qty 100

## 2016-11-25 MED ORDER — ROSUVASTATIN CALCIUM 20 MG PO TABS
20.0000 mg | ORAL_TABLET | Freq: Every day | ORAL | Status: DC
  Administered 2016-11-25 – 2016-11-27 (×3): 20 mg via ORAL
  Filled 2016-11-25 (×3): qty 1

## 2016-11-25 MED ORDER — ISOSORBIDE MONONITRATE ER 30 MG PO TB24
30.0000 mg | ORAL_TABLET | Freq: Every day | ORAL | Status: DC
  Administered 2016-11-25 – 2016-11-27 (×3): 30 mg via ORAL
  Filled 2016-11-25 (×3): qty 1

## 2016-11-25 MED ORDER — ACETAMINOPHEN 500 MG PO TABS: 1000.0000 mg | ORAL_TABLET | Freq: Four times a day (QID) | ORAL | Status: DC | PRN

## 2016-11-25 MED ORDER — ASPIRIN 81 MG PO CHEW
CHEWABLE_TABLET | ORAL | Status: AC
  Administered 2016-11-25: 81 mg via ORAL
  Filled 2016-11-25: qty 1

## 2016-11-25 MED ORDER — PANTOPRAZOLE SODIUM 40 MG PO TBEC
40.0000 mg | DELAYED_RELEASE_TABLET | Freq: Every day | ORAL | Status: DC
  Administered 2016-11-25 – 2016-11-27 (×3): 40 mg via ORAL
  Filled 2016-11-25 (×3): qty 1

## 2016-11-25 MED ORDER — ASPIRIN 81 MG PO CHEW
81.0000 mg | CHEWABLE_TABLET | ORAL | Status: AC
  Administered 2016-11-25: 81 mg via ORAL

## 2016-11-25 MED ORDER — SODIUM CHLORIDE 0.9% FLUSH
3.0000 mL | INTRAVENOUS | Status: DC | PRN
Start: 2016-11-25 — End: 2016-11-25

## 2016-11-25 MED ORDER — SODIUM CHLORIDE 0.9% FLUSH
3.0000 mL | INTRAVENOUS | Status: DC | PRN
  Administered 2016-11-25: 3 mL via INTRAVENOUS
  Filled 2016-11-25: qty 3

## 2016-11-25 MED ORDER — VERAPAMIL HCL 2.5 MG/ML IV SOLN
INTRAVENOUS | Status: AC
Start: 2016-11-25 — End: 2016-11-25
  Filled 2016-11-25: qty 2

## 2016-11-25 MED ORDER — SODIUM BICARBONATE BOLUS VIA INFUSION
INTRAVENOUS | Status: DC
  Filled 2016-11-25: qty 1

## 2016-11-25 SURGICAL SUPPLY — 15 items
CATH EXPO 5F FL3.5 (CATHETERS) ×2 IMPLANT
CATH EXPO 5FR FR4 (CATHETERS) ×2 IMPLANT
CATH OPTITORQUE TIG 4.0 5F (CATHETERS) ×2 IMPLANT
CATH SITESEER 5F NTR (CATHETERS) ×2 IMPLANT
DEVICE RAD COMP TR BAND LRG (VASCULAR PRODUCTS) ×2 IMPLANT
GLIDESHEATH SLEND A-KIT 6F 22G (SHEATH) ×2 IMPLANT
GUIDEWIRE INQWIRE 1.5J.035X260 (WIRE) IMPLANT
INQWIRE 1.5J .035X260CM (WIRE) ×3
KIT HEART LEFT (KITS) ×3 IMPLANT
PACK CARDIAC CATHETERIZATION (CUSTOM PROCEDURE TRAY) ×3 IMPLANT
SHEATH PINNACLE 5F 10CM (SHEATH) ×2 IMPLANT
TRANSDUCER W/STOPCOCK (MISCELLANEOUS) ×3 IMPLANT
TUBING CIL FLEX 10 FLL-RA (TUBING) ×3 IMPLANT
WIRE EMERALD 3MM-J .035X150CM (WIRE) ×2 IMPLANT
WIRE HI TORQ VERSACORE-J 145CM (WIRE) ×2 IMPLANT

## 2016-11-25 NOTE — Consult Note (Signed)
Reason for Consult:Left main/ 3 vessel CAD Referring Physician: Dr. Gwenlyn Found Cards= Dr. Rozanna Boer Audrey Mullen is an 76 y.o. female.  HPI: 76 yo woman with a cc/o exertional CP  Audrey Mullen is a 76 yo woman with a past history of CAD, paroxysmal atrial fibrillation, diabetes mellitus (per chart, she denies), hypertension, hyperlipidemia, glaucoma, arthritis and GERD. She was hospitalized in May 2017 with urosepsis. During that admission she had atrial fib with a RVR. She also had an elevated troponin. An echocardiogram showed an EF of 55-60%. She was in SR at dc.  Over the past couple of months she has been having exertional chest tightness associated with shortness of breath. No radiation and no N/V or diaphoresis. Relieved by rest. No nocturnal or rest symptoms. A stress test showed a reversible defect in the basal inferior wall. Today she had cardiac cath which revealed left main and 3 vessel CAD.   She is currently pain free. She lives at home with her husband. Life long nonsmoker.  Past Medical History:  Diagnosis Date  . Anginal pain ([place])   . Arthritis   . Coronary artery disease   . Diabetes mellitus without complication ([place])   . Dysrhythmia    atrail fibrillation  . GERD (gastroesophageal reflux disease)   . Glaucoma    bilat   . Hyperlipidemia   . Hypertension   . Kidney stone   . Urinary tract infection   . Wears glasses     Past Surgical History:  Procedure Laterality Date  . CARDIAC CATHETERIZATION N/A 11/25/2016   Procedure: Left Heart Cath and Coronary Angiography;  Surgeon: Lorretta Harp, MD;  Location: [place] [facility];  Service: Cardiovascular;  Laterality: N/A;  . CYSTOSCOPY WITH RETROGRADE PYELOGRAM, URETEROSCOPY AND STENT PLACEMENT Left 06/03/2016   Procedure: CYSTOSCOPY WITH LEFT RETROGRADE PYELOGRAM, URETEROSCOPY, REMOVAL OF LEFT NEPHROSTOMY TUBE, INSERTION OF DOUBLE J STENT, LEFT;  Surgeon: Carolan Clines, MD;  Location: [facility];  Service:  Urology;  Laterality: Left;  . EYE SURGERY     cataract removed per left eye   . HOLMIUM LASER APPLICATION Left 3/81/0175   Procedure: HOLMIUM LASER OF STONE ;  Surgeon: Carolan Clines, MD;  Location: [facility];  Service: Urology;  Laterality: Left;  . ROTATOR CUFF REPAIR      Family History  Problem Relation Age of Onset  . CAD Neg Hx     Social History:  reports that she has never smoked. She has never used smokeless tobacco. She reports that she does not drink alcohol or use drugs.  Allergies: No Known Allergies  Medications:  Scheduled: . [START ON 11/26/2016] aspirin EC  81 mg Oral Daily  . carvedilol  3.125 mg Oral BID WC  . [START ON 11/26/2016] diltiazem  240 mg Oral Daily  . isosorbide mononitrate  30 mg Oral Daily  . latanoprost  1 drop Both Eyes QHS  . pantoprazole  40 mg Oral Daily  . [START ON 11/26/2016] predniSONE  7.5 mg Oral Q breakfast  . rosuvastatin  20 mg Oral q1800  . sodium chloride flush  3 mL Intravenous Q12H  . spironolactone  25 mg Oral Daily    Results for orders placed or performed during the hospital encounter of 11/25/16 (from the past 48 hour(s))  Protime-INR     Status: Abnormal   Collection Time: 11/25/16  6:40 AM  Result Value Ref Range   Prothrombin Time 15.8 (H) 11.4 - 15.2 seconds   INR 1.25  Basic metabolic panel     Status: Abnormal   Collection Time: 11/25/16  9:21 AM  Result Value Ref Range   Sodium 139 135 - 145 mmol/L   Potassium 4.9 3.5 - 5.1 mmol/L    Comment: HEMOLYSIS AT THIS LEVEL MAY AFFECT RESULT   Chloride 105 101 - 111 mmol/L   CO2 26 22 - 32 mmol/L   Glucose, Bld 131 (H) 65 - 99 mg/dL   BUN 22 (H) 6 - 20 mg/dL   Creatinine, Ser 1.42 (H) 0.44 - 1.00 mg/dL   Calcium 9.2 8.9 - 10.3 mg/dL   GFR calc non Af Amer 35 (L) >60 mL/min   GFR calc Af Amer 40 (L) >60 mL/min    Comment: (NOTE) The eGFR has been calculated using the CKD EPI equation. This calculation has not been validated in all clinical situations. eGFR's  persistently <60 mL/min signify possible Chronic Kidney Disease.    Anion gap 8 5 - 15  Glucose, capillary     Status: Abnormal   Collection Time: 11/25/16 12:33 PM  Result Value Ref Range   Glucose-Capillary 33 (LL) 65 - 99 mg/dL   Comment 1 Notify RN   Glucose, capillary     Status: Abnormal   Collection Time: 11/25/16 12:35 PM  Result Value Ref Range   Glucose-Capillary 127 (H) 65 - 99 mg/dL    No results found.  Review of Systems  Constitutional: Positive for malaise/fatigue. Negative for chills and fever.  Eyes: Negative for blurred vision and double vision.       Glaucoma  Respiratory: Positive for shortness of breath. Negative for cough and wheezing.   Cardiovascular: Positive for chest pain and palpitations. Negative for orthopnea, claudication and leg swelling.  Gastrointestinal: Positive for heartburn. Negative for blood in stool.  Genitourinary: Negative for dysuria and hematuria.       Kidney stones  Musculoskeletal: Positive for joint pain.  Neurological: Negative for speech change, focal weakness and loss of consciousness.  All other systems reviewed and are negative.  Blood pressure (!) 144/53, pulse (!) 51, temperature 98.1 F (36.7 C), temperature source Oral, resp. rate 17, height 5' 4"  (1.626 m), weight 160 lb (72.6 kg), SpO2 98 %. Physical Exam  Vitals reviewed. Constitutional: She is oriented to person, place, and time. She appears well-developed and well-nourished. No distress.  HENT:  Head: Normocephalic and atraumatic.  Mouth/Throat: No oropharyngeal exudate.  Eyes: Conjunctivae and EOM are normal. No scleral icterus.  Neck: Neck supple. No thyromegaly present.  No carotid bruits  Cardiovascular: Normal rate, regular rhythm, normal heart sounds and intact distal pulses.  Exam reveals no gallop and no friction rub.   No murmur heard. Respiratory: Effort normal and breath sounds normal. No respiratory distress. She has no wheezes. She has no rales.   GI: Soft. She exhibits no distension. There is no tenderness.  Musculoskeletal: She exhibits no edema.  Lymphadenopathy:    She has no cervical adenopathy.  Neurological: She is alert and oriented to person, place, and time. No cranial nerve deficit.  No focal motor deficit  Skin: Skin is warm and dry.   ECHO 04/03/16 Study Conclusions  - Left ventricle: The cavity size was normal. There was mild focal   basal hypertrophy of the septum. Systolic function was normal.   The estimated ejection fraction was in the range of 55% to 60%.   Wall motion was normal; there were no regional wall motion   abnormalities. Left ventricular diastolic function parameters  were normal. - Aortic valve: Trileaflet; moderately thickened, moderately   calcified leaflets. - Left atrium: The atrium was mildly dilated.  CARDIAC CATHETERIZATION 11/25/16 Conclusion     Ost LM lesion, 80 %stenosed.  Mid LAD lesion, 70 %stenosed.  Ost Cx to Prox Cx lesion, 80 %stenosed.  2nd Mrg lesion, 95 %stenosed.  Prox RCA lesion, 90 %stenosed.  Mid RCA lesion, 95 %stenosed.     I personally reviewed the cath images and concur with the findings noted above  Assessment/Plan: 76 yo woman with multiple CRF and history of paroxysmal AF who presents with classic exertional angina. By cath she has left main and 3 vessel CAD. CABG is indicated for survival benefit and relief of symptoms.  I have discussed the general nature of the procedure, the need for general anesthesia, the use of cardiopulmonary bypass, and the incisions to be used with Audrey Mullen. We discussed the expected hospital stay, overall recovery and short and long term outcomes. I informed her of the indications, risks, benefits and alternatives. She understands the risks include, but are not limited to death, stroke, MI, DVT/PE, bleeding, possible need for transfusion, infections, cardiac arrhythmias, as well as other organ system dysfunction  including respiratory, renal, or GI complications. Increased risk for perioperative atrial fib, transfusions and renal failure  She understands and accepts these risks and agrees to proceed.  We also discussed the possibility of a limited Maze procedure for her atrial fibrillation. We will discuss that further before she decides.  As it now stands the 1st available OR time is Friday 1/12  Melrose Nakayama 11/25/2016, 3:56 PM

## 2016-11-25 NOTE — H&P (View-Only) (Signed)
Cardiology Office Note    Date:  11/21/2016   ID:  JWANA HOFSTETTER, DOB 08-18-1941, MRN [id number]  PCP:  Elyn Peers, MD  Cardiologist:  Dr. Johnsie Cancel  Chief Complaint: Chest pain follow up  History of Present Illness:   Audrey Mullen is a 76 y.o. female Audrey Mullen is a 76 y.o. female HTN, HLD, DM, nephrolithiasis, and PAF who presented for chest pain follow up.   She was admitted at [facility] from 5/16-5/21 with sepsis secondary to acute pyelonephritis and obstructive uropathy. She was also found to be afib with RVR and elevated troponin. He had elevated lactic acid level, elevated white blood cell count on arrival. CTA scan of abdomen and pelvis revealed moderate left hydronephrosis secondary to UPJ obstruction from stone. He underwent nephrostomy tube placement on 04/02/2016. 2-D echocardiogram obtained on 04/03/2016 showed mild focal basal hypertrophy of the septum, EF 55-60%, no regional wall motion abnormalities. Due to elevated creatinine and low GFR, patient is not a candidate for NOAC. She converted to sinus rhythm since discharge.   Added to my schedule 11/07/16 for exertional chest pain. Also had palpitations. F/u labs were normal. Stress test was intermediate risk with reversible, medium-sized, mild basal-mid inferior and basal inferoseptal perfusion defect.  This is concerning for ischemia. Still pending 24 hours monitor.  Here today for follow up. Her chest pain has been improved but not completely resolved. Occurs only with extreme exertion and resolved with rest. Intermittent palpitations. No dyspnea, LE edema, orthopnea, PND, syncope, melena or blood in her stool.    Past Medical History:  Diagnosis Date  . Arthritis   . Diabetes mellitus without complication ([place])   . Dysrhythmia    atrail fibrillation  . Glaucoma    bilat   . Hyperlipidemia   . Hypertension   . Kidney stone   . Urinary tract infection   . Wears glasses      Past Surgical History:  Procedure Laterality Date  . CYSTOSCOPY WITH RETROGRADE PYELOGRAM, URETEROSCOPY AND STENT PLACEMENT Left 06/03/2016   Procedure: CYSTOSCOPY WITH LEFT RETROGRADE PYELOGRAM, URETEROSCOPY, REMOVAL OF LEFT NEPHROSTOMY TUBE, INSERTION OF DOUBLE J STENT, LEFT;  Surgeon: Carolan Clines, MD;  Location: [facility];  Service: Urology;  Laterality: Left;  . EYE SURGERY     cataract removed per left eye   . HOLMIUM LASER APPLICATION Left [id number]   Procedure: HOLMIUM LASER OF STONE ;  Surgeon: Carolan Clines, MD;  Location: [facility];  Service: Urology;  Laterality: Left;  . ROTATOR CUFF REPAIR      Current Medications: Prior to Admission medications   Medication Sig Start Date End Date Taking? Authorizing Provider  diltiazem (CARDIZEM CD) 240 MG 24 hr capsule Take 1 capsule (240 mg total) by mouth daily. 04/18/16   Almyra Deforest, PA  isosorbide mononitrate (IMDUR) 30 MG 24 hr tablet Take 1 tablet (30 mg total) by mouth daily. 11/07/16 02/05/17  Mousa Prout, PA  latanoprost (XALATAN) 0.005 % ophthalmic solution Place 1 drop into both eyes at bedtime.    Historical Provider, MD  pantoprazole (PROTONIX) 40 MG tablet Take 1 tablet (40 mg total) by mouth daily. 11/07/16   Leshae Mcclay, PA  predniSONE (DELTASONE) 5 MG tablet Take 5 mg by mouth daily with breakfast. Taking 1.5 tablets daily    Historical Provider, MD  rosuvastatin (CRESTOR) 20 MG tablet Take 20 mg by mouth daily.    Historical Provider, MD  warfarin (COUMADIN) 7.5 MG tablet Take as directed  by [facility] 07/15/16   Josue Hector, MD    Allergies:   Patient has no known allergies.   Social History   Social History  . Marital status: Married    Spouse name: N/A  . Number of children: N/A  . Years of education: N/A   Social History Main Topics  . Smoking status: Never Smoker  . Smokeless tobacco: Never Used  . Alcohol use No  . Drug use: No  . Sexual activity: Not on file   Other Topics  Concern  . Not on file   Social History Narrative  . No narrative on file     Family History:  The patient's family history is not on file.  ROS:   Please see the history of present illness.    ROS All other systems reviewed and are negative.   PHYSICAL EXAM:   VS:  BP 140/80   Pulse 78   Ht 5\' 4"  (1.626 m)   Wt 159 lb 6.4 oz (72.3 kg)   SpO2 95% Comment: at rest  BMI 27.36 kg/m    GEN: Well nourished, well developed, in no acute distress  HEENT: normal  Neck: no JVD, carotid bruits, or masses Cardiac: RRR; no murmurs, rubs, or gallops,no edema  Respiratory:  clear to auscultation bilaterally, normal work of breathing GI: soft, nontender, nondistended, + BS MS: no deformity or atrophy  Skin: warm and dry, no rash Neuro:  Alert and Oriented x 3, Strength and sensation are intact Psych: euthymic mood, full affect  Wt Readings from Last 3 Encounters:  11/21/16 159 lb 6.4 oz (72.3 kg)  11/14/16 155 lb (70.3 kg)  11/07/16 155 lb (70.3 kg)      Studies/Labs Reviewed:   EKG:  EKG is not ordered today.    Recent Labs: 04/02/2016: ALT 19 04/07/2016: Magnesium 1.7 05/30/2016: BUN 15; Creatinine, Ser 1.29; Potassium 4.6; Sodium 143 11/07/2016: Brain Natriuretic Peptide 18.9; Hemoglobin 12.1; Platelets 216; TSH 0.40   Lipid Panel    Component Value Date/Time   CHOL 178 04/04/2016 0327   TRIG 237 (H) 04/04/2016 0327   HDL 24 (L) 04/04/2016 0327   CHOLHDL 7.4 04/04/2016 0327   VLDL 47 (H) 04/04/2016 0327   LDLCALC 107 (H) 04/04/2016 0327    Additional studies/ records that were reviewed today include:   Echocardiogram: 04/03/16 Echo 04/03/2016 LV EF: 55% - 60%  ------------------------------------------------------------------- Indications: Atrial fibrillation - 427.31.  ------------------------------------------------------------------- History: PMH: Kidney Stones- Sepsis. Elevated Troponin&'s. Risk factors: Diabetes mellitus.  Dyslipidemia.  ------------------------------------------------------------------- Study Conclusions  - Left ventricle: The cavity size was normal. There was mild focal  basal hypertrophy of the septum. Systolic function was normal.  The estimated ejection fraction was in the range of 55% to 60%.  Wall motion was normal; there were no regional wall motion  abnormalities. Left ventricular diastolic function parameters  were normal. - Aortic valve: Trileaflet; moderately thickened, moderately  calcified leaflets. - Left atrium: The atrium was mildly dilated.  Myoivew: 11/14/16  Nuclear stress EF: 66%.  There was no ST segment deviation noted during stress.  Findings consistent with ischemia.  This is an intermediate risk study.  The left ventricular ejection fraction is hyperdynamic (>65%).   1. Reversible, medium-sized, mild basal-mid inferior and basal inferoseptal perfusion defect.  This is concerning for ischemia.  2. Normal LV systolic function.     ASSESSMENT & PLAN:    1. Exertional chest tightness - Lexiscan abnormal as above. Will plan for cath. Discussed  with Dr. Johnsie Cancel - hold coumadin 4 days before cath with no lovenox bridge needed. Discussed with Pharmacist Talbot. She was adjust dose accordingly.  - Cath Monday. Hold coumadin starting today. Will start her on ASA 81mg  and coreg 3.125mg  BID. Might need to discontinue ASA pending cath.   The patient understands that risks include but are not limited to stroke (1 in 1000), death (1 in 12), kidney failure [usually temporary] (1 in 500), bleeding (1 in 200), allergic reaction [possibly serious] (1 in 200), and agrees to proceed.   2. Palpitaions/PVCs - Pending 24 hours holter  3. PAF - Initial episode occurred in setting of acute pyelonephritis and obstructive uropathy. No re-occurance.  CHA2DS2-Vasc 5 (HTN, age, F sex, DM). Continue coumadin. As above.   4. HTN - Today 140/80. Has been running high  at home as well. Will add Coreg 3.125mg  BID.   5. HLD - 04/04/2016: Cholesterol 178; HDL 24; LDL Cholesterol 107; Triglycerides 237; VLDL 47  - Continue Crestor 20mg  qd for now. If stent required, consider up-titration.     Medication Adjustments/Labs and Tests Ordered: Current medicines are reviewed at length with the patient today.  Concerns regarding medicines are outlined above.  Medication changes, Labs and Tests ordered today are listed in the Patient Instructions below. Patient Instructions  Medication Instructions:  Your physician has recommended you make the following change in your medication:  1. Start Coreg (3.125 mg ) twice a day 2. Start Asprin (81 mg) daily 3. Start Nitroglycerin (0.4 mg ) sublingual, Take up to three times every five minutes for chest pain if no relief call 911 All medications sent in today to patient's requested pharmacy.    Labwork: Your physician recommends that you have lab work today: bmet/cbc/ptt/pt/inr.   Testing/Procedures: Your physician has requested that you have a cardiac catheterization. Cardiac catheterization is used to diagnose and/or treat various heart conditions. Doctors may recommend this procedure for a number of different reasons. The most common reason is to evaluate chest pain. Chest pain can be a symptom of coronary artery disease (CAD), and cardiac catheterization can show whether plaque is narrowing or blocking your heart's arteries. This procedure is also used to evaluate the valves, as well as measure the blood flow and oxygen levels in different parts of your heart. For further information please visit [email or web address]. Please follow instruction sheet, as given.    Follow-Up: Your physician recommends that you keep scheduled  follow-up appointment in two weeks with Robbie Lis, PA.   Any Other Special Instructions Will Be Listed Below (If Applicable).  Your provider has recommended a cardiac catherization  You are  scheduled for a cardiac catheterization on Monday, January 8 with Dr. Burt Knack or associate.  Please arrive at the [facility] (Main Entrance) at [facility] at [address] [place] Stay on Monday, January 8 at 5:30 am.    Special note: Every effort is made to have your procedure done on time.   Please understand that emergencies sometimes delay a scheduled   procedure.  No food or drink after midnight on Sunday, January 7.   You may take your morning medications with a sip of water on the day of your procedure.  Medications to HOLD - coumadin starting tonight until after your procedure.   Plan for a one night stay -- bring personal belongings.  Bring a current list of your medications and current insurance cards.  You MUST have a responsible person  to drive you home. Someone MUST be with you the first 24 hours after you arrive home or your discharge will be delayed. Wear clothes that are easy to get on and off and wear slip on shoes.    Coronary Angiogram A coronary angiogram, also called coronary angiography, is an X-ray procedure used to look at the arteries in the heart. In this procedure, a dye (contrast dye) is injected through a long, hollow tube (catheter). The catheter is about the size of a piece of cooked spaghetti and is inserted through your groin, wrist, or arm. The dye is injected into each artery, and X-rays are then taken to show if there is a blockage in the arteries of your heart.  LET [facility] CARE PROVIDER KNOW ABOUT:  Any allergies you have, including allergies to shellfish or contrast dye.   All medicines you are taking, including vitamins, herbs, eye drops, creams, and over-the-counter medicines.   Previous problems you or members of your family have had with the use of anesthetics.   Any blood disorders you have.   Previous surgeries you have had.  History of kidney problems or failure.   Other medical conditions you  have.  RISKS AND COMPLICATIONS  Generally, a coronary angiogram is a safe procedure. However, about 1 person out of 1000 can have problems that may include:  Allergic reaction to the dye.  Bleeding/bruising from the access site or other locations.  Kidney injury, especially in people with impaired kidney function.  Stroke (rare).  Heart attack (rare).  Irregular rhythms (rare)  Death (rare)  BEFORE THE PROCEDURE   Do not eat or drink anything after midnight the night before the procedure or as directed by your health care provider.   Ask your health care provider about changing or stopping your regular medicines. This is especially important if you are taking diabetes medicines or blood thinners.  PROCEDURE  You may be given a medicine to help you relax (sedative) before the procedure. This medicine is given through an intravenous (IV) access tube that is inserted into one of your veins.   The area where the catheter will be inserted will be washed and shaved. This is usually done in the groin but may be done in the fold of your arm (near your elbow) or in the wrist.   A medicine will be given to numb the area where the catheter will be inserted (local anesthetic).   The health care provider will insert the catheter into an artery. The catheter will be guided by using a special type of X-ray (fluoroscopy) of the blood vessel being examined.   A special dye will then be injected into the catheter, and X-rays will be taken. The dye will help to show where any narrowing or blockages are located in the heart arteries.    AFTER THE PROCEDURE   If the procedure is done through the leg, you will be kept in bed lying flat for several hours. You will be instructed to not bend or cross your legs.  The insertion site will be checked frequently.   The pulse in your feet or wrist will be checked frequently.   Additional blood tests, X-rays, and an electrocardiogram may be  done.       If you need a refill on your cardiac medications before your next appointment, please call your pharmacy.      Jarrett Soho, PA  11/21/2016 8:33 AM    [facility]  [address], [place], [place]  [address] Phone: [phone]; Fax: [phone]

## 2016-11-25 NOTE — Progress Notes (Signed)
102f sheath removed from RFA at 1220.  Manual pressure applied to rt groin for 20 min.  Rt groin is level 0 pre and post sheath removal.  Post sheath removal instructions given to pt.  Pt understands and acknowledges.  Rt. PT doppled pre and post sheath removal.

## 2016-11-25 NOTE — Progress Notes (Signed)
ANTICOAGULATION CONSULT NOTE - Initial Consult  Pharmacy Consult for Heparin Indication: atrial fibrillation (start 6 hrs post TR band removal)  No Known Allergies  Patient Measurements: Height: 5\' 4"  (162.6 cm) Weight: 160 lb (72.6 kg) IBW/kg (Calculated) : 54.7 Heparin Dosing Weight: 69.6 kg  Vital Signs: Temp: 98.1 F (36.7 C) (01/08 1600) Temp Source: Oral (01/08 1600) BP: 148/45 (01/08 1600) Pulse Rate: 57 (01/08 1600)  Labs:  Recent Labs  11/25/16 0640 11/25/16 0921  LABPROT 15.8*  --   INR 1.25  --   CREATININE  --  1.42*    Estimated Creatinine Clearance: 32.4 mL/min (by C-G formula based on SCr of 1.42 mg/dL (H)).   Medical History: Past Medical History:  Diagnosis Date  . Anginal pain ([place])   . Arthritis   . Coronary artery disease   . Diabetes mellitus without complication ([place])   . Dysrhythmia    atrail fibrillation  . GERD (gastroesophageal reflux disease)   . Glaucoma    bilat   . Hyperlipidemia   . Hypertension   . Kidney stone   . Urinary tract infection   . Wears glasses     Assessment: 76 year old female on chronic Coumadin prior to admission for atrial fibrillation which has been held for possible CABG on 1/12 now s/p cardiac cath to start IV heparin 6 hours after TR band removed.   TR band was removed per RN progress note at 1430 PM.  No bleeding noted from site.   Goal of Therapy:  Heparin level 0.3-0.7 units/ml Monitor platelets by anticoagulation protocol: Yes   Plan:  Heparin to start at 20:30 PM. Start heparin infusion at 800 units/hr Check anti-Xa level in 8 hours and daily while on heparin Continue to monitor H&H and platelets   Sloan Leiter, PharmD, BCPS Clinical Pharmacist [phone] until 11 PM tonight [phone] after hours.  11/25/2016,6:58 PM

## 2016-11-25 NOTE — Interval H&P Note (Signed)
Cath Lab Visit (complete for each Cath Lab visit)  Clinical Evaluation Leading to the Procedure:   ACS: No.  Non-ACS:    Anginal Classification: CCS II  Anti-ischemic medical therapy: Maximal Therapy (2 or more classes of medications)  Non-Invasive Test Results: Intermediate-risk stress test findings: cardiac mortality 1-3%/year  Prior CABG: No previous CABG      History and Physical Interval Note:  11/25/2016 11:09 AM  Audrey Mullen  has presented today for surgery, with the diagnosis of abnormal stress test  The various methods of treatment have been discussed with the patient and family. After consideration of risks, benefits and other options for treatment, the patient has consented to  Procedure(s): Left Heart Cath and Coronary Angiography (N/A) as a surgical intervention .  The patient's history has been reviewed, patient examined, no change in status, stable for surgery.  I have reviewed the patient's chart and labs.  Questions were answered to the patient's satisfaction.     Quay Burow

## 2016-11-25 NOTE — Progress Notes (Signed)
Spoke with jennifer rn cath lab/ bicarb to be started @1100 . NS started earlier.

## 2016-11-25 NOTE — Care Management Note (Signed)
Case Management Note  Patient Details  Name: Audrey Mullen MRN: [id number] Date of Birth: 07-21-1941  Subjective/Objective:  S/p cardiac cath, NCM will cont to follow for dc needs.                   Action/Plan:   Expected Discharge Date:                  Expected Discharge Plan:  Home/Self Care  In-House Referral:     Discharge planning Services  CM Consult  Post Acute Care Choice:    Choice offered to:     DME Arranged:    DME Agency:     HH Arranged:    HH Agency:     Status of Service:  In process, will continue to follow  If discussed at Long Length of Stay Meetings, dates discussed:    Additional Comments:  Zenon Mayo, RN 11/25/2016, 3:00 PM

## 2016-11-25 NOTE — Progress Notes (Signed)
TR BAND REMOVAL  LOCATION:    right radial  DEFLATED PER PROTOCOL:    Yes.    TIME BAND OFF / DRESSING APPLIED:    1430   SITE UPON ARRIVAL:    Level 0  SITE AFTER BAND REMOVAL:    Level 0  CIRCULATION SENSATION AND MOVEMENT:    Within Normal Limits   Yes.    COMMENTS:   Rechecked site at 1500 and frequently after with no change in assessment

## 2016-11-26 ENCOUNTER — Inpatient Hospital Stay (HOSPITAL_COMMUNITY): Payer: Medicare Other

## 2016-11-26 DIAGNOSIS — Z01810 Encounter for preprocedural cardiovascular examination: Secondary | ICD-10-CM

## 2016-11-26 DIAGNOSIS — I200 Unstable angina: Secondary | ICD-10-CM

## 2016-11-26 DIAGNOSIS — I25110 Atherosclerotic heart disease of native coronary artery with unstable angina pectoris: Secondary | ICD-10-CM

## 2016-11-26 LAB — GLUCOSE, CAPILLARY
GLUCOSE-CAPILLARY: 161 mg/dL — AB (ref 65–99)
GLUCOSE-CAPILLARY: 168 mg/dL — AB (ref 65–99)
Glucose-Capillary: 33 mg/dL — CL (ref 65–99)
Glucose-Capillary: 150 mg/dL — ABNORMAL HIGH (ref 65–99)
Glucose-Capillary: 197 mg/dL — ABNORMAL HIGH (ref 65–99)

## 2016-11-26 LAB — HEPARIN LEVEL (UNFRACTIONATED)
HEPARIN UNFRACTIONATED: 1.08 [IU]/mL — AB (ref 0.30–0.70)
Heparin Unfractionated: 0.3 IU/mL (ref 0.30–0.70)
Heparin Unfractionated: 0.83 IU/mL — ABNORMAL HIGH (ref 0.30–0.70)
Heparin Unfractionated: 1.08 IU/mL — ABNORMAL HIGH (ref 0.30–0.70)

## 2016-11-26 LAB — CBC
HEMATOCRIT: 33.1 % — AB (ref 36.0–46.0)
Hemoglobin: 10.5 g/dL — ABNORMAL LOW (ref 12.0–15.0)
MCH: 26.9 pg (ref 26.0–34.0)
MCHC: 31.7 g/dL (ref 30.0–36.0)
MCV: 84.7 fL (ref 78.0–100.0)
PLATELETS: 179 10*3/uL (ref 150–400)
RBC: 3.91 MIL/uL (ref 3.87–5.11)
RDW: 15.6 % — AB (ref 11.5–15.5)
WBC: 6.8 10*3/uL (ref 4.0–10.5)

## 2016-11-26 LAB — BASIC METABOLIC PANEL
Anion gap: 7 (ref 5–15)
BUN: 27 mg/dL — AB (ref 6–20)
CALCIUM: 8.7 mg/dL — AB (ref 8.9–10.3)
CO2: 26 mmol/L (ref 22–32)
Chloride: 105 mmol/L (ref 101–111)
Creatinine, Ser: 1.72 mg/dL — ABNORMAL HIGH (ref 0.44–1.00)
GFR calc Af Amer: 32 mL/min — ABNORMAL LOW (ref >60)
GFR, EST NON AFRICAN AMERICAN: 27 mL/min — AB (ref >60)
GLUCOSE: 151 mg/dL — AB (ref 65–99)
POTASSIUM: 4.9 mmol/L (ref 3.5–5.1)
Sodium: 138 mmol/L (ref 135–145)

## 2016-11-26 NOTE — Progress Notes (Signed)
CARDIAC REHAB PHASE I   PRE:  Rate/Rhythm: 70 SR    BP: sitting 129/71    SaO2: 99 RA  MODE:  Ambulation: 220 ft   POST:  Rate/Rhythm: 85 SR with PACs    BP: sitting 163/49     SaO2: 100 RA  Pt used RW due to weak back and hips. Fairly steady with RW. She endorsed chest tightness on return to room. Lasted 2-3 minutes after rest. BP somewhat elevated. Discussed sternal precautions, IS (pt practiced, inspiring 1250 mL right now), mobility post op, and d/c planning. She lives with her son and husband who will be available at d/c. Gave pt [facility] careguide and she already has booklet. Encouraged her to read and watch video with family. We will sign off since pt had angina with fairly short distance. She should be able to walk in room. [place], ACSM 11/26/2016 8:39 AM

## 2016-11-26 NOTE — Care Management Note (Signed)
Case Management Note  Patient Details  Name: Audrey Mullen MRN: [id number] Date of Birth: September 15, 1941  Subjective/Objective:    Patient lives with wife, Leane Call, 11/14/2016, findings consistent with ischemia and normal LV function -Cardiac cath on 11/25/16 revealed left main and three-vessel disease. Coronary artery bypass grafting was recommended and CT surgeon has seen the patient who is in agreement with surgery. Surgery is tentatively planned for 11/29/16.                Action/Plan:   Expected Discharge Date:                  Expected Discharge Plan:  Home/Self Care  In-House Referral:     Discharge planning Services  CM Consult  Post Acute Care Choice:    Choice offered to:     DME Arranged:    DME Agency:     HH Arranged:    HH Agency:     Status of Service:  In process, will continue to follow  If discussed at Long Length of Stay Meetings, dates discussed:    Additional Comments:  Zenon Mayo, RN 11/26/2016, 3:55 PM

## 2016-11-26 NOTE — Progress Notes (Signed)
ANTICOAGULATION CONSULT NOTE   Pharmacy Consult for Heparin Indication: atrial fibrillation   No Known Allergies  Patient Measurements: Height: 5\' 4"  (162.6 cm) Weight: 160 lb (72.6 kg) IBW/kg (Calculated) : 54.7 Heparin Dosing Weight: 69.6 kg  Vital Signs: Temp: 98.7 F (37.1 C) (01/08 1941) Temp Source: Oral (01/08 1941) BP: 111/53 (01/08 2050) Pulse Rate: 68 (01/08 2050)  Labs:  Recent Labs  11/25/16 0640 11/25/16 0921 11/26/16 0214  HGB  --   --  10.5*  HCT  --   --  33.1*  PLT  --   --  179  LABPROT 15.8*  --   --   INR 1.25  --   --   HEPARINUNFRC  --   --  0.30  CREATININE  --  1.42*  --     Estimated Creatinine Clearance: 32.4 mL/min (by C-G formula based on SCr of 1.42 mg/dL (H)).   Medical History: Past Medical History:  Diagnosis Date  . Anginal pain ([place])   . Arthritis   . Coronary artery disease   . Diabetes mellitus without complication ([place])   . Dysrhythmia    atrail fibrillation  . GERD (gastroesophageal reflux disease)   . Glaucoma    bilat   . Hyperlipidemia   . Hypertension   . Kidney stone   . Urinary tract infection   . Wears glasses     Assessment: 76 year old female on chronic Coumadin prior to admission for atrial fibrillation which has been held for possible CABG on 1/12 now s/p cardiac cath on heparin at 800 units/hr -heparin level = 0.3   Goal of Therapy:  Heparin level 0.3-0.7 units/ml Monitor platelets by anticoagulation protocol: Yes   Plan:   -Increase heparin to 850 units/hr to keep in range -heparin level in 8hrs  Hildred Laser, Pharm D 11/26/2016 3:20 AM

## 2016-11-26 NOTE — Progress Notes (Signed)
1 Day Post-Op Procedure(s) (LRB): Left Heart Cath and Coronary Angiography (N/A) Subjective: No complaints  Objective: Vital signs in last 24 hours: Temp:  [97.5 F (36.4 C)-98.8 F (37.1 C)] 97.5 F (36.4 C) (01/09 1719) Pulse Rate:  [60-75] 60 (01/09 1719) Cardiac Rhythm: Normal sinus rhythm (01/09 0740) Resp:  [14-20] 14 (01/09 1719) BP: (100-142)/(42-71) 142/61 (01/09 1719) SpO2:  [95 %-99 %] 96 % (01/09 1719) Weight:  [154 lb 1.6 oz (69.9 kg)] 154 lb 1.6 oz (69.9 kg) (01/09 0552)  Hemodynamic parameters for last 24 hours:    Intake/Output from previous day: 01/08 0701 - 01/09 0700 In: 1062.8 [P.O.:120; I.V.:942.8] Out: 2000 [Urine:2000] Intake/Output this shift: Total I/O In: 439.6 [P.O.:360; I.V.:79.6] Out: 801 [Urine:800; Stool:1]  General appearance: alert, cooperative and no distress Neurologic: intact Heart: regular rate and rhythm  Lab Results:  Recent Labs  11/26/16 0214  WBC 6.8  HGB 10.5*  HCT 33.1*  PLT 179   BMET:  Recent Labs  11/25/16 0921 11/26/16 0214  NA 139 138  K 4.9 4.9  CL 105 105  CO2 26 26  GLUCOSE 131* 151*  BUN 22* 27*  CREATININE 1.42* 1.72*  CALCIUM 9.2 8.7*    PT/INR:  Recent Labs  11/25/16 0640  LABPROT 15.8*  INR 1.25   ABG No results found for: PHART, HCO3, TCO2, ACIDBASEDEF, O2SAT CBG (last 3)   Recent Labs  11/26/16 0610 11/26/16 1136 11/26/16 1717  GLUCAP 150* 161* 168*    Assessment/Plan: S/P Procedure(s) (LRB): Left Heart Cath and Coronary Angiography (N/A) -  Doing well. No CP or SOB since admission Due to a cancellation in the schedule we are going to move her up to Thursday 1/11 Recheck creatinine in AM   LOS: 1 day    Melrose Nakayama 11/26/2016

## 2016-11-26 NOTE — Progress Notes (Signed)
Pre-op Cardiac Surgery  Carotid Findings:   Findings are consistent with a 1-39 percent stenosis involving the right internal carotid artery and the left internal carotid artery. The vertebral arteries demonstrate antegrade flow.  Upper Extremity Right Left  Brachial Pressures 152  Triphasic 137  Triphasic  Radial Waveforms Triphasic Triphasic  Ulnar Waveforms Triphasic Triphasic  Palmar Arch (Allen's Test) Palmar waveforms decrease greater than fifty percent with radial compression and remain within normal limits with ulnar compression. Palmar waveforms decrease greater than fifty percent with radial compression and remain within normal limits with ulnar compression.    Lower  Extremity Right Left  Dorsalis Pedis Monophasic Biphasic  Posterior Tibial Biphasic Biphasic  Ankle/Brachial Indices 0.77 0.95    Findings:   Right ABI of 0.77 is suggestive of moderate arterial occlusive disease at rest. Left ABI of 0.95 is suggestive of arterial flow within normal limits at rest.

## 2016-11-26 NOTE — Progress Notes (Signed)
Patient Name: Audrey Mullen Date of Encounter: 11/26/2016  Primary Cardiologist: Dr [facility] Problem List     Active Problems:   Stable angina pectoris [facility])   Abnormal nuclear stress test   Coronary artery disease due to lipid rich plaque     Subjective   Audrey Mullen is feeling well this morning with resting chest pain, shortness of breath, orthopnea or dizziness. She did have mild chest tightness when walking with cardiac rehab this morning that resolved with rest.  Inpatient Medications    Scheduled Meds: . aspirin EC  81 mg Oral Daily  . carvedilol  3.125 mg Oral BID WC  . diltiazem  240 mg Oral Daily  . insulin aspart  0-15 Units Subcutaneous TID WC  . isosorbide mononitrate  30 mg Oral Daily  . latanoprost  1 drop Both Eyes QHS  . pantoprazole  40 mg Oral Daily  . predniSONE  7.5 mg Oral Q breakfast  . rosuvastatin  20 mg Oral q1800  . sodium chloride flush  3 mL Intravenous Q12H  . spironolactone  25 mg Oral Daily   Continuous Infusions: . heparin 850 Units/hr (11/26/16 0600)   PRN Meds: sodium chloride, acetaminophen, hydrALAZINE, morphine injection, nitroGLYCERIN, ondansetron (ZOFRAN) IV, sodium chloride flush   Vital Signs    Vitals:   11/26/16 0000 11/26/16 0430 11/26/16 0552 11/26/16 0749  BP: (!) 100/50 (!) 129/49  129/71  Pulse: 64 66  75  Resp: 15 16  20   Temp: 98.8 F (37.1 C) 98.8 F (37.1 C)  98.2 F (36.8 C)  TempSrc: Oral Oral  Oral  SpO2: 98% 96%  99%  Weight:   154 lb 1.6 oz (69.9 kg)   Height:        Intake/Output Summary (Last 24 hours) at 11/26/16 0904 Last data filed at 11/26/16 [id number]  Gross per 24 hour  Intake          1182.82 ml  Output             2000 ml  Net          -817.18 ml   Filed Weights   11/25/16 0538 11/26/16 0552  Weight: 160 lb (72.6 kg) 154 lb 1.6 oz (69.9 kg)    Physical Exam    GEN: Well nourished, well developed, AA female in no acute distress.  HEENT: Grossly normal.  Neck:  Supple, no JVD, carotid bruits, or masses. Cardiac: RRR, no murmurs, rubs, or gallops. No clubbing, cyanosis, edema.  Radials/DP/PT 2+ and equal bilaterally.  Respiratory:  Respirations regular and unlabored, clear to auscultation bilaterally. GI: Soft, nontender, nondistended, BS + x 4. MS: no deformity or atrophy. Skin: warm and dry, no rash. Neuro:  Strength and sensation are intact. Psych: AAOx3.  Normal affect.  Right wrist and right groin cath sites soft without bleeding, ecchymosis or hematoma  Labs    CBC  Recent Labs  11/26/16 0214  WBC 6.8  HGB 10.5*  HCT 33.1*  MCV 84.7  PLT 0000000   Basic Metabolic Panel  Recent Labs  11/25/16 0921 11/26/16 0214  NA 139 138  K 4.9 4.9  CL 105 105  CO2 26 26  GLUCOSE 131* 151*  BUN 22* 27*  CREATININE 1.42* 1.72*  CALCIUM 9.2 8.7*   Liver Function Tests No results for input(s): AST, ALT, ALKPHOS, BILITOT, PROT, ALBUMIN in the last 72 hours. No results for input(s): LIPASE, AMYLASE in the last 72 hours. Cardiac Enzymes No results  for input(s): CKTOTAL, CKMB, CKMBINDEX, TROPONINI in the last 72 hours. BNP Invalid input(s): POCBNP D-Dimer No results for input(s): DDIMER in the last 72 hours. Hemoglobin A1C No results for input(s): HGBA1C in the last 72 hours. Fasting Lipid Panel No results for input(s): CHOL, HDL, LDLCALC, TRIG, CHOLHDL, LDLDIRECT in the last 72 hours. Thyroid Function Tests No results for input(s): TSH, T4TOTAL, T3FREE, THYROIDAB in the last 72 hours.  Invalid input(s): FREET3  Telemetry    Sinus rhythm/sinus bradycardia with rates in the 50's-70 bpm, had one 4 beat run of NSVT - Personally Reviewed  ECG    No new tracings  Radiology    No results found.  Cardiac Studies   11/25/2016- Cardiac cath  Ost LM lesion, 80 %stenosed.  Mid LAD lesion, 70 %stenosed.  Ost Cx to Prox Cx lesion, 80 %stenosed.  2nd Mrg lesion, 95 %stenosed.  Prox RCA lesion, 90 %stenosed.  Mid RCA lesion, 95  %stenosed. Audrey Mullen has left main/three-vessel disease. She has low normal LV function by 2-D echocardiogram. She will will require coronary artery bypass grafting. The radial sheath was removed and a TR band was placed on the right wrist which is patent hemostasis. The femoral sheath will be removed and pressure held. She'll be hydrated for the next 12 hours because of her baseline moderate renal insufficiency.  TCTS was notified.  11/14/16- Myoview  Nuclear stress EF: 66%.  There was no ST segment deviation noted during stress.  Findings consistent with ischemia.  This is an intermediate risk study.  The left ventricular ejection fraction is hyperdynamic (>65%).  1. Reversible, medium-sized, mild basal-mid inferior and basal inferoseptal perfusion defect. This is concerning for ischemia.  2. Normal LV systolic function.   04/03/2016 Echo Study Conclusions - Left ventricle: The cavity size was normal. There was mild focal   basal hypertrophy of the septum. Systolic function was normal.   The estimated ejection fraction was in the range of 55% to 60%.   Wall motion was normal; there were no regional wall motion   abnormalities. Left ventricular diastolic function parameters   were normal. - Aortic valve: Trileaflet; moderately thickened, moderately   calcified leaflets. - Left atrium: The atrium was mildly dilated.   Patient Profile     76 yo female with past medical history of PAF, HTN, HLD, DM, and nephrolithiasis who presented for chest pain follow up, abnormal lexiscan myoview and planned cardiac cath.  Assessment & Plan    1. CAD -Exertional chest tightness that resolves with rest -Lexiscan Myoview, 11/14/2016, findings consistent with ischemia and normal LV function -Cardiac cath on 11/25/16 revealed left main and three-vessel disease. Coronary artery bypass grafting was recommended and CT surgeon has seen the patient who is in agreement with surgery. Surgery is  tentatively planned for 11/29/16. Right wrist and groin cath sites stable. -Patient had episode of exertional chest tightness while walking approximately 200 feet with cardiac rehabilitation this morning, resolved with rest. Continue Imdur and prn SL NTG.  2. PAF -Atrial fib in 03/2016 in setting of acute pyelonephritis. She is maintaining sinus rhythm on diltiazem and carvedilol -Was anticoagulated with Coumadin for CHA2DS2-Vasc 5 (HTN, age, F sex, DM). Now on hold for cardiac cath and upcoming procedure. Currently anticoagulated with IV heparin  3. Hypertension -Home medications include diltiazem 240 mg daily, Imdur and Aldactone. Coreg 3.125 mg twice a day recently added. These have been continued as inpatient with blood pressures 100-148/49-94 -Will continue to monitor  4. Hyperlipidemia -Lipid  panel 04/04/16: Total cholesterol 178, LDL 107, triglycerides 237 -Continue statin  5. CKD -Baseline creatinine around 1.3-1.4 -Creatinine pre-cath was 1.42, post-cath 1.72. Will continue to monitor daily. If does not improve, may need to hold spironolactone. Already had dose today.  Signed, Daune Perch, NP  11/26/2016, 9:04 AM   History and all data above reviewed.  Patient examined.  I agree with the findings as above.  Chest pain with ambulation but no pain currently.  No SOB.  The patient exam reveals COR:RRR  ,  Lungs: Clear  ,  Abd: Positive bowel sounds, no rebound no guarding, Ext Right wrist without bruising or bleeding  .  All available labs, radiology testing, previous records reviewed. Agree with documented assessment and plan. UNSTABLE ANGINA:  3 V CAD.  CABG later this week. Given unstable symptoms needs to stay in the hospital with IV heparin. CKD:  Creat up . Follow.    Jeneen Rinks Brystol Wasilewski  11:43 AM  11/26/2016

## 2016-11-26 NOTE — Progress Notes (Signed)
ANTICOAGULATION CONSULT NOTE - Follow-Up Consult  Pharmacy Consult for Heparin Indication: atrial fibrillation , CAD pending CABG  No Known Allergies  Patient Measurements: Height: 5\' 4"  (162.6 cm) Weight: 154 lb 1.6 oz (69.9 kg) IBW/kg (Calculated) : 54.7 Heparin Dosing Weight: 69.6 kg  Vital Signs: Temp: 98.2 F (36.8 C) (01/09 1114) Temp Source: Oral (01/09 1114) BP: 120/65 (01/09 1114) Pulse Rate: 72 (01/09 1114)  Labs:  Recent Labs  11/25/16 0640 11/25/16 0921 11/26/16 0214 11/26/16 1133  HGB  --   --  10.5*  --   HCT  --   --  33.1*  --   PLT  --   --  179  --   LABPROT 15.8*  --   --   --   INR 1.25  --   --   --   HEPARINUNFRC  --   --  0.30 0.83*  CREATININE  --  1.42* 1.72*  --     Estimated Creatinine Clearance: 26.3 mL/min (by C-G formula based on SCr of 1.72 mg/dL (H)).   Medical History: Past Medical History:  Diagnosis Date  . Anginal pain ([place])   . Arthritis   . Coronary artery disease   . Diabetes mellitus without complication ([place])   . Dysrhythmia    atrail fibrillation  . GERD (gastroesophageal reflux disease)   . Glaucoma    bilat   . Hyperlipidemia   . Hypertension   . Kidney stone   . Urinary tract infection   . Wears glasses     Assessment: 76 year old female on chronic Coumadin prior to admission for atrial fibrillation which is now on hold for CABG on 1/12.  Heparin restarted post-cath.  Heparin level = 0.83 on 850 units/hr.   Goal of Therapy:  Heparin level 0.3-0.7 units/ml Monitor platelets by anticoagulation protocol: Yes   Plan:   Reduce heparin to 750 units/hr Heparin level in 6 hours Heparin level and CBC daily while on heparin  [employer], Pharm.D., BCPS Clinical Pharmacist Pager [phone] 11/26/2016 3:21 PM

## 2016-11-27 ENCOUNTER — Inpatient Hospital Stay (HOSPITAL_COMMUNITY): Payer: Medicare Other

## 2016-11-27 LAB — HEPARIN LEVEL (UNFRACTIONATED)
HEPARIN UNFRACTIONATED: 0.89 [IU]/mL — AB (ref 0.30–0.70)
Heparin Unfractionated: 0.61 IU/mL (ref 0.30–0.70)

## 2016-11-27 LAB — GLUCOSE, CAPILLARY
GLUCOSE-CAPILLARY: 144 mg/dL — AB (ref 65–99)
Glucose-Capillary: 166 mg/dL — ABNORMAL HIGH (ref 65–99)
Glucose-Capillary: 175 mg/dL — ABNORMAL HIGH (ref 65–99)
Glucose-Capillary: 147 mg/dL — ABNORMAL HIGH (ref 65–99)

## 2016-11-27 LAB — PULMONARY FUNCTION TEST
DL/VA % pred: 100 %
DL/VA: 4.83 ml/min/mmHg/L
DLCO COR % PRED: 64 %
DLCO COR: 15.74 ml/min/mmHg
DLCO UNC: 14.62 ml/min/mmHg
DLCO unc % pred: 60 %
FEF 25-75 POST: 1.87 L/s
FEF 25-75 Pre: 1.27 L/sec
FEF2575-%Change-Post: 47 %
FEF2575-%PRED-POST: 133 %
FEF2575-%PRED-PRE: 90 %
FEV1-%CHANGE-POST: 10 %
FEV1-%PRED-POST: 100 %
FEV1-%Pred-Pre: 90 %
FEV1-POST: 1.63 L
FEV1-Pre: 1.48 L
FEV1FVC-%Change-Post: 3 %
FEV1FVC-%PRED-PRE: 100 %
FEV6-%Change-Post: 6 %
FEV6-%Pred-Post: 102 %
FEV6-%Pred-Pre: 95 %
FEV6-POST: 2.06 L
FEV6-Pre: 1.93 L
FEV6FVC-%Change-Post: 0 %
FEV6FVC-%Pred-Post: 104 %
FEV6FVC-%Pred-Pre: 103 %
FVC-%Change-Post: 6 %
FVC-%PRED-POST: 98 %
FVC-%PRED-PRE: 92 %
FVC-POST: 2.07 L
FVC-PRE: 1.95 L
POST FEV1/FVC RATIO: 79 %
PRE FEV1/FVC RATIO: 76 %
PRE FEV6/FVC RATIO: 99 %
Post FEV6/FVC ratio: 100 %
RV % pred: 210 %
RV: 4.92 L
TLC % PRED: 135 %
TLC: 6.82 L

## 2016-11-27 LAB — VAS US DOPPLER PRE CABG
LCCADDIAS: -13 cm/s
LCCADSYS: -73 cm/s
LCCAPDIAS: -11 cm/s
LCCAPSYS: -93 cm/s
LEFT ECA DIAS: 0 cm/s
LEFT VERTEBRAL DIAS: -8 cm/s
Left ICA dist dias: -29 cm/s
Left ICA dist sys: -77 cm/s
Left ICA prox dias: -15 cm/s
Left ICA prox sys: -46 cm/s
RCCADSYS: 72 cm/s
RCCAPDIAS: 15 cm/s
RCCAPSYS: 72 cm/s
RIGHT VERTEBRAL DIAS: -13 cm/s

## 2016-11-27 LAB — BLOOD GAS, ARTERIAL
ACID-BASE DEFICIT: 1.2 mmol/L (ref 0.0–2.0)
Bicarbonate: 23.1 mmol/L (ref 20.0–28.0)
DRAWN BY: 205171
FIO2: 0.21
O2 SAT: 94.9 %
PATIENT TEMPERATURE: 98.6
PH ART: 7.392 (ref 7.350–7.450)
pCO2 arterial: 38.8 mmHg (ref 32.0–48.0)
pO2, Arterial: 74.6 mmHg — ABNORMAL LOW (ref 83.0–108.0)

## 2016-11-27 LAB — HEPATIC FUNCTION PANEL
ALT: 32 U/L (ref 14–54)
AST: 24 U/L (ref 15–41)
Albumin: 3.6 g/dL (ref 3.5–5.0)
Alkaline Phosphatase: 46 U/L (ref 38–126)
Bilirubin, Direct: 0.2 mg/dL (ref 0.1–0.5)
Indirect Bilirubin: 0.4 mg/dL (ref 0.3–0.9)
TOTAL PROTEIN: 6.2 g/dL — AB (ref 6.5–8.1)
Total Bilirubin: 0.6 mg/dL (ref 0.3–1.2)

## 2016-11-27 LAB — BASIC METABOLIC PANEL
Anion gap: 9 (ref 5–15)
BUN: 28 mg/dL — ABNORMAL HIGH (ref 6–20)
CHLORIDE: 104 mmol/L (ref 101–111)
CO2: 23 mmol/L (ref 22–32)
CREATININE: 1.67 mg/dL — AB (ref 0.44–1.00)
Calcium: 9.1 mg/dL (ref 8.9–10.3)
GFR calc Af Amer: 33 mL/min — ABNORMAL LOW (ref >60)
GFR calc non Af Amer: 28 mL/min — ABNORMAL LOW (ref >60)
Glucose, Bld: 197 mg/dL — ABNORMAL HIGH (ref 65–99)
POTASSIUM: 4.6 mmol/L (ref 3.5–5.1)
SODIUM: 136 mmol/L (ref 135–145)

## 2016-11-27 LAB — CBC
HEMATOCRIT: 34.3 % — AB (ref 36.0–46.0)
HEMOGLOBIN: 11 g/dL — AB (ref 12.0–15.0)
MCH: 27 pg (ref 26.0–34.0)
MCHC: 32.1 g/dL (ref 30.0–36.0)
MCV: 84.3 fL (ref 78.0–100.0)
Platelets: 171 10*3/uL (ref 150–400)
RBC: 4.07 MIL/uL (ref 3.87–5.11)
RDW: 16 % — ABNORMAL HIGH (ref 11.5–15.5)
WBC: 8.5 10*3/uL (ref 4.0–10.5)

## 2016-11-27 LAB — URINALYSIS, COMPLETE (UACMP) WITH MICROSCOPIC
BILIRUBIN URINE: NEGATIVE
GLUCOSE, UA: NEGATIVE mg/dL
KETONES UR: NEGATIVE mg/dL
Nitrite: NEGATIVE
PROTEIN: NEGATIVE mg/dL
Specific Gravity, Urine: 1.016 (ref 1.005–1.030)
pH: 5 (ref 5.0–8.0)

## 2016-11-27 LAB — LIPID PANEL
CHOL/HDL RATIO: 4 ratio
Cholesterol: 166 mg/dL (ref 0–200)
HDL: 41 mg/dL (ref >40)
LDL Cholesterol: 89 mg/dL (ref 0–99)
TRIGLYCERIDES: 181 mg/dL — AB (ref <150)
VLDL: 36 mg/dL (ref 0–40)

## 2016-11-27 LAB — MRSA PCR SCREENING: MRSA by PCR: NEGATIVE

## 2016-11-27 LAB — ABO/RH: ABO/RH(D): O POS

## 2016-11-27 MED ORDER — CHLORHEXIDINE GLUCONATE CLOTH 2 % EX PADS: 6.0000 | MEDICATED_PAD | Freq: Once | CUTANEOUS | Status: DC

## 2016-11-27 MED ORDER — BISACODYL 5 MG PO TBEC: 5.0000 mg | DELAYED_RELEASE_TABLET | Freq: Once | ORAL | Status: DC

## 2016-11-27 MED ORDER — METOPROLOL TARTRATE 12.5 MG HALF TABLET
12.5000 mg | ORAL_TABLET | Freq: Once | ORAL | Status: AC
  Administered 2016-11-28: 06:00:00 12.5 mg via ORAL
  Filled 2016-11-27: qty 1

## 2016-11-27 MED ORDER — CHLORHEXIDINE GLUCONATE 4 % EX LIQD
CUTANEOUS | Status: AC
  Administered 2016-11-27: 22:00:00
  Filled 2016-11-27: qty 120

## 2016-11-27 MED ORDER — TEMAZEPAM 15 MG PO CAPS: 15.0000 mg | ORAL_CAPSULE | Freq: Once | ORAL | Status: DC | PRN

## 2016-11-27 MED ORDER — SODIUM CHLORIDE 0.9 % IV SOLN
1.5000 mg/kg/h | INTRAVENOUS | Status: AC
  Administered 2016-11-28: 1.5 mg/kg/h via INTRAVENOUS
  Filled 2016-11-27: qty 25

## 2016-11-27 MED ORDER — MAGNESIUM SULFATE 50 % IJ SOLN
40.0000 meq | INTRAMUSCULAR | Status: DC
  Filled 2016-11-27: qty 10

## 2016-11-27 MED ORDER — ALBUTEROL SULFATE (2.5 MG/3ML) 0.083% IN NEBU
2.5000 mg | INHALATION_SOLUTION | Freq: Once | RESPIRATORY_TRACT | Status: AC
  Administered 2016-11-27: 09:00:00 2.5 mg via RESPIRATORY_TRACT

## 2016-11-27 MED ORDER — TRANEXAMIC ACID (OHS) PUMP PRIME SOLUTION
2.0000 mg/kg | INTRAVENOUS | Status: DC
  Filled 2016-11-27: qty 1.42

## 2016-11-27 MED ORDER — DOPAMINE-DEXTROSE 3.2-5 MG/ML-% IV SOLN
0.0000 ug/kg/min | INTRAVENOUS | Status: DC
  Filled 2016-11-27: qty 250

## 2016-11-27 MED ORDER — PHENYLEPHRINE HCL 10 MG/ML IJ SOLN
30.0000 ug/min | INTRAVENOUS | Status: AC
  Administered 2016-11-28: 25 ug/min via INTRAVENOUS
  Filled 2016-11-27: qty 2

## 2016-11-27 MED ORDER — DIAZEPAM 2 MG PO TABS
2.0000 mg | ORAL_TABLET | Freq: Once | ORAL | Status: AC
  Administered 2016-11-28: 2 mg via ORAL
  Filled 2016-11-27: qty 1

## 2016-11-27 MED ORDER — NITROGLYCERIN IN D5W 200-5 MCG/ML-% IV SOLN
2.0000 ug/min | INTRAVENOUS | Status: AC
Start: 2016-11-28 — End: 2016-11-28
  Administered 2016-11-28: 5 ug/min via INTRAVENOUS
  Filled 2016-11-27: qty 250

## 2016-11-27 MED ORDER — TRANEXAMIC ACID (OHS) BOLUS VIA INFUSION
15.0000 mg/kg | INTRAVENOUS | Status: AC
  Administered 2016-11-28: 1065 mg via INTRAVENOUS
  Filled 2016-11-27: qty 1065

## 2016-11-27 MED ORDER — DEXMEDETOMIDINE HCL IN NACL 400 MCG/100ML IV SOLN
0.1000 ug/kg/h | INTRAVENOUS | Status: AC
  Administered 2016-11-28: .5 ug/kg/h via INTRAVENOUS
  Filled 2016-11-27: qty 100

## 2016-11-27 MED ORDER — SODIUM CHLORIDE 0.9 % IV SOLN
INTRAVENOUS | Status: AC
  Administered 2016-11-28: 3.1 [IU]/h via INTRAVENOUS
  Administered 2016-11-28: 1.5 [IU]/h via INTRAVENOUS
  Filled 2016-11-27: qty 2.5

## 2016-11-27 MED ORDER — VANCOMYCIN HCL 10 G IV SOLR
1250.0000 mg | INTRAVENOUS | Status: AC
  Administered 2016-11-28: 1250 mg via INTRAVENOUS
  Filled 2016-11-27 (×2): qty 1250

## 2016-11-27 MED ORDER — HEPARIN (PORCINE) IN NACL 100-0.45 UNIT/ML-% IJ SOLN
450.0000 [IU]/h | INTRAMUSCULAR | Status: DC
  Administered 2016-11-27: 600 [IU]/h via INTRAVENOUS
  Filled 2016-11-27: qty 250

## 2016-11-27 MED ORDER — CHLORHEXIDINE GLUCONATE 0.12 % MT SOLN
15.0000 mL | Freq: Once | OROMUCOSAL | Status: AC
  Administered 2016-11-28: 06:00:00 15 mL via OROMUCOSAL
  Filled 2016-11-27: qty 15

## 2016-11-27 MED ORDER — PLASMA-LYTE 148 IV SOLN
INTRAVENOUS | Status: AC
  Administered 2016-11-28: 10:00:00 500 mL
  Filled 2016-11-27: qty 2.5

## 2016-11-27 MED ORDER — DEXTROSE 5 % IV SOLN
750.0000 mg | INTRAVENOUS | Status: DC
  Filled 2016-11-27: qty 750

## 2016-11-27 MED ORDER — POTASSIUM CHLORIDE 2 MEQ/ML IV SOLN
80.0000 meq | INTRAVENOUS | Status: DC
  Filled 2016-11-27: qty 40

## 2016-11-27 MED ORDER — HEPARIN (PORCINE) IN NACL 100-0.45 UNIT/ML-% IJ SOLN: 400.0000 [IU]/h | INTRAMUSCULAR | Status: DC

## 2016-11-27 MED ORDER — DEXTROSE 5 % IV SOLN
1.5000 g | INTRAVENOUS | Status: AC
  Administered 2016-11-28: .75 g via INTRAVENOUS
  Administered 2016-11-28: 1.5 g via INTRAVENOUS
  Filled 2016-11-27 (×2): qty 1.5

## 2016-11-27 MED ORDER — EPINEPHRINE PF 1 MG/ML IJ SOLN
0.0000 ug/min | INTRAVENOUS | Status: DC
  Filled 2016-11-27: qty 4

## 2016-11-27 MED ORDER — HEPARIN SODIUM (PORCINE) 1000 UNIT/ML IJ SOLN
INTRAMUSCULAR | Status: DC
  Filled 2016-11-27: qty 30

## 2016-11-27 NOTE — Progress Notes (Signed)
2 Days Post-Op Procedure(s) (LRB): Left Heart Cath and Coronary Angiography (N/A) Subjective: No complaints, no CP since hospitalization  Objective: Vital signs in last 24 hours: Temp:  [97.5 F (36.4 C)-98 F (36.7 C)] 97.7 F (36.5 C) (01/10 0810) Pulse Rate:  [57-71] 71 (01/10 1351) Cardiac Rhythm: Normal sinus rhythm (01/10 0754) Resp:  [14-22] 21 (01/10 1351) BP: (111-142)/(43-73) 114/52 (01/10 1351) SpO2:  [95 %-96 %] 95 % (01/10 1351) Weight:  [156 lb 8.4 oz (71 kg)] 156 lb 8.4 oz (71 kg) (01/10 0253)  Hemodynamic parameters for last 24 hours:    Intake/Output from previous day: 01/09 0701 - 01/10 0700 In: 1379 [P.O.:960; I.V.:419] Out: 1301 [Urine:1300; Stool:1] Intake/Output this shift: Total I/O In: 266.6 [P.O.:240; I.V.:26.6] Out: 200 [Urine:200]  General appearance: alert, cooperative and no distress Neurologic: intact Heart: regular rate and rhythm  Lab Results:  Recent Labs  11/26/16 0214 11/27/16 0244  WBC 6.8 8.5  HGB 10.5* 11.0*  HCT 33.1* 34.3*  PLT 179 171   BMET:  Recent Labs  11/26/16 0214 11/27/16 0244  NA 138 136  K 4.9 4.6  CL 105 104  CO2 26 23  GLUCOSE 151* 197*  BUN 27* 28*  CREATININE 1.72* 1.67*  CALCIUM 8.7* 9.1    PT/INR:  Recent Labs  11/25/16 0640  LABPROT 15.8*  INR 1.25   ABG    Component Value Date/Time   PHART 7.392 11/27/2016 0925   HCO3 23.1 11/27/2016 0925   ACIDBASEDEF 1.2 11/27/2016 0925   O2SAT 94.9 11/27/2016 0925   CBG (last 3)   Recent Labs  11/26/16 2117 11/27/16 0624 11/27/16 1217  GLUCAP 197* 144* 166*    Assessment/Plan: S/P Procedure(s) (LRB): Left Heart Cath and Coronary Angiography (N/A) -For CABG in AM Has history of atrial fib- discussed Maze procedure (left sided) with her. She understands the nature of the procedure in conjunction with CABG. She understands it will add some time to the procedure. Higher risk of heart block and need for permanent pacemaker  She wishes to  have Maze done at time of CABG   LOS: 2 days    Melrose Nakayama 11/27/2016

## 2016-11-27 NOTE — Progress Notes (Addendum)
Patient Name: Audrey Mullen Date of Encounter: 11/27/2016  Primary Cardiologist: Dr [facility] Problem List     Active Problems:   Stable angina pectoris [facility])   Abnormal nuclear stress test   Coronary artery disease due to lipid rich plaque     Subjective   Audrey Mullen is feeling well this morning with no chest pain, shortness of breath, orthopnea or dizziness. She is glad that surgery got moved up to tomorrow.  Inpatient Medications    Scheduled Meds: . aspirin EC  81 mg Oral Daily  . carvedilol  3.125 mg Oral BID WC  . diltiazem  240 mg Oral Daily  . insulin aspart  0-15 Units Subcutaneous TID WC  . isosorbide mononitrate  30 mg Oral Daily  . latanoprost  1 drop Both Eyes QHS  . pantoprazole  40 mg Oral Daily  . predniSONE  7.5 mg Oral Q breakfast  . rosuvastatin  20 mg Oral q1800  . sodium chloride flush  3 mL Intravenous Q12H  . spironolactone  25 mg Oral Daily   Continuous Infusions: . heparin 600 Units/hr (11/27/16 0130)   PRN Meds: sodium chloride, acetaminophen, hydrALAZINE, morphine injection, nitroGLYCERIN, ondansetron (ZOFRAN) IV, sodium chloride flush   Vital Signs    Vitals:   11/26/16 1900 11/26/16 2215 11/27/16 0130 11/27/16 0253  BP: 111/65 (!) 128/53 (!) 129/53   Pulse: 68 64 (!) 57   Resp: (!) 22 18 19    Temp: 98 F (36.7 C)  97.7 F (36.5 C)   TempSrc: Oral  Oral   SpO2: 96% 96% 95%   Weight:    156 lb 8.4 oz (71 kg)  Height:        Intake/Output Summary (Last 24 hours) at 11/27/16 0743 Last data filed at 11/27/16 0257  Gross per 24 hour  Intake             1276 ml  Output             1301 ml  Net              -25 ml   Filed Weights   11/25/16 0538 11/26/16 0552 11/27/16 0253  Weight: 160 lb (72.6 kg) 154 lb 1.6 oz (69.9 kg) 156 lb 8.4 oz (71 kg)    Physical Exam    GEN: Well nourished, well developed, AA female in no acute distress.  HEENT: Grossly normal.  Neck: Supple, no JVD, carotid bruits, or  masses. Cardiac: RRR, no murmurs, rubs, or gallops. No clubbing, cyanosis, edema.  Radials/DP/PT 2+ and equal bilaterally.  Respiratory:  Respirations regular and unlabored, clear to auscultation bilaterally. GI: Soft, nontender, nondistended, BS + x 4. MS: no deformity or atrophy. Skin: warm and dry, no rash. Neuro:  Strength and sensation are intact. Psych: AAOx3.  Normal affect.  Right wrist and right groin cath sites soft without bleeding, ecchymosis or hematoma  Labs    CBC  Recent Labs  11/26/16 0214 11/27/16 0244  WBC 6.8 8.5  HGB 10.5* 11.0*  HCT 33.1* 34.3*  MCV 84.7 84.3  PLT 179 [id number]   Basic Metabolic Panel  Recent Labs  11/26/16 0214 11/27/16 0244  NA 138 136  K 4.9 4.6  CL 105 104  CO2 26 23  GLUCOSE 151* 197*  BUN 27* 28*  CREATININE 1.72* 1.67*  CALCIUM 8.7* 9.1   Liver Function Tests No results for input(s): AST, ALT, ALKPHOS, BILITOT, PROT, ALBUMIN in the last 72 hours. No  results for input(s): LIPASE, AMYLASE in the last 72 hours. Cardiac Enzymes No results for input(s): CKTOTAL, CKMB, CKMBINDEX, TROPONINI in the last 72 hours. BNP Invalid input(s): POCBNP D-Dimer No results for input(s): DDIMER in the last 72 hours. Hemoglobin A1C No results for input(s): HGBA1C in the last 72 hours. Fasting Lipid Panel No results for input(s): CHOL, HDL, LDLCALC, TRIG, CHOLHDL, LDLDIRECT in the last 72 hours. Thyroid Function Tests No results for input(s): TSH, T4TOTAL, T3FREE, THYROIDAB in the last 72 hours.  Invalid input(s): FREET3  Telemetry    Sinus rhythm/sinus bradycardia with rates in the 50's-70 bpm, with PVCs - Personally Reviewed  ECG    No new tracings  Radiology    No results found.  Cardiac Studies   11/26/2016 Carotid dopplers Findings are consistent with a 1-39 percent stenosis involving the right internal carotid artery and the left internal carotid artery. The vertebral arteries demonstrate antegrade flow.  11/25/2016- Cardiac  cath  Ost LM lesion, 80 %stenosed.  Mid LAD lesion, 70 %stenosed.  Ost Cx to Prox Cx lesion, 80 %stenosed.  2nd Mrg lesion, 95 %stenosed.  Prox RCA lesion, 90 %stenosed.  Mid RCA lesion, 95 %stenosed. Audrey Mullen has left main/three-vessel disease. She has low normal LV function by 2-D echocardiogram. She will will require coronary artery bypass grafting. The radial sheath was removed and a TR band was placed on the right wrist which is patent hemostasis. The femoral sheath will be removed and pressure held. She'll be hydrated for the next 12 hours because of her baseline moderate renal insufficiency.  TCTS was notified.  11/14/16- Myoview  Nuclear stress EF: 66%.  There was no ST segment deviation noted during stress.  Findings consistent with ischemia.  This is an intermediate risk study.  The left ventricular ejection fraction is hyperdynamic (>65%).  1. Reversible, medium-sized, mild basal-mid inferior and basal inferoseptal perfusion defect. This is concerning for ischemia.  2. Normal LV systolic function.   04/03/2016 Echo Study Conclusions - Left ventricle: The cavity size was normal. There was mild focal   basal hypertrophy of the septum. Systolic function was normal.   The estimated ejection fraction was in the range of 55% to 60%.   Wall motion was normal; there were no regional wall motion   abnormalities. Left ventricular diastolic function parameters   were normal. - Aortic valve: Trileaflet; moderately thickened, moderately   calcified leaflets. - Left atrium: The atrium was mildly dilated.   Patient Profile     76 yo female with past medical history of PAF, HTN, HLD, DM, and nephrolithiasis who presented for chest pain follow up, abnormal lexiscan myoview and planned cardiac cath.  Assessment & Plan    1. CAD -Exertional chest tightness that resolves with rest -Lexiscan Myoview, 11/14/2016, findings consistent with ischemia and normal LV  function -Cardiac cath on 11/25/16 revealed left main and three-vessel disease. Coronary artery bypass grafting was recommended and CT surgeon has seen the patient who is in agreement with surgery. Surgery is planned for 11/28/16. Right wrist and groin cath sites stable. -Patient had episode of exertional chest tightness while walking approximately 200 feet with cardiac rehabilitation on 1/9, resolved with rest. No chest pain since then. Continue Imdur and prn SL NTG.  2. PAF -Atrial fib in 03/2016 in setting of acute pyelonephritis. She is maintaining sinus rhythm on diltiazem and carvedilol -Holter monitor of 11/21/16 showed NSR, Frequent PVCs and PACs, no NSVT and no PAF -Was anticoagulated with Coumadin for CHA2DS2-Vasc 6 (  HTN, age, F sex, DM, CAD). Now on hold for cardiac cath and upcoming CT surgery. Currently anticoagulated with IV heparin  3. Hypertension -Home medications include diltiazem 240 mg daily, Imdur and Aldactone. Coreg 3.125 mg twice a day recently added.   -Blood pressures 120s-140's/40's-60's -Will continue to monitor  4. Hyperlipidemia -Lipid panel 04/04/16: Total cholesterol 178, LDL 107, triglycerides 237 -Will recheck lipid panel and hepatic function -Considering known CAD, new goal for LDL is <70. If LDL not to goal will increase Crestor to 40 mg daily  5. CKD -Baseline creatinine around 1.3-1.4     Stage III -Creatinine pre-cath was 1.42, post-cath 1.72, 1/10-1.67. Will continue to monitor daily.   SignedDaune Perch, NP  11/27/2016, 7:43 AM  Pager: [phone]  History and all data above reviewed.  Patient examined.  I agree with the findings as above. No chest pain.  No SOB.   The patient exam reveals COR:RRR  ,  Lungs: Clear  ,  Abd: Positive bowel sounds, no rebound no guarding, Ext No edema  .  All available labs, radiology testing, previous records reviewed. Agree with documented assessment and plan. CAD:  CABG in the AM.   PAF:  NSR.  Warfarin on hold.     Jeneen Rinks Lucia Harm  2:11 PM  11/27/2016

## 2016-11-27 NOTE — Anesthesia Preprocedure Evaluation (Addendum)
Anesthesia Evaluation  Patient identified by MRN, date of birth, ID band Patient awake    Reviewed: Allergy & Precautions, NPO status , Patient's Chart, lab work & pertinent test results  History of Anesthesia Complications Negative for: history of anesthetic complications  Airway Mallampati: I  TM Distance: >3 FB Neck ROM: Full    Dental  (+) Teeth Intact, Dental Advisory Given   Pulmonary neg pulmonary ROS,    Pulmonary exam normal        Cardiovascular hypertension, Pt. on medications + angina + CAD  Normal cardiovascular exam+ dysrhythmias Atrial Fibrillation   left main/three-vessel disease. She has low normal LV function by 2-D echocardiogram   Neuro/Psych negative neurological ROS  negative psych ROS   GI/Hepatic GERD  ,  Endo/Other  diabetes, Type 2, Oral Hypoglycemic Agents  Renal/GU Renal InsufficiencyRenal disease     Musculoskeletal   Abdominal   Peds  Hematology   Anesthesia Other Findings   Reproductive/Obstetrics                            Anesthesia Physical  Anesthesia Plan  ASA: IV  Anesthesia Plan: General   Post-op Pain Management:    Induction: Intravenous  Airway Management Planned: Oral ETT  Additional Equipment: PA Cath, Ultrasound Guidance Line Placement, Arterial line and 3D TEE  Intra-op Plan:   Post-operative Plan: Extubation in OR and Post-operative intubation/ventilation  Informed Consent: I have reviewed the patients History and Physical, chart, labs and discussed the procedure including the risks, benefits and alternatives for the proposed anesthesia with the patient or authorized representative who has indicated his/her understanding and acceptance.   Dental advisory given  Plan Discussed with: Surgeon, CRNA and Anesthesiologist  Anesthesia Plan Comments:        Anesthesia Quick Evaluation

## 2016-11-27 NOTE — Progress Notes (Signed)
ANTICOAGULATION CONSULT NOTE   Pharmacy Consult for Heparin Indication: atrial fibrillation   No Known Allergies  Patient Measurements: Height: 5\' 4"  (162.6 cm) Weight: 154 lb 1.6 oz (69.9 kg) IBW/kg (Calculated) : 54.7 Heparin Dosing Weight: 69.6 kg  Vital Signs: Temp: 98 F (36.7 C) (01/09 1900) Temp Source: Oral (01/09 1900) BP: 128/53 (01/09 2215) Pulse Rate: 64 (01/09 2215)  Labs:  Recent Labs  11/25/16 0640 11/25/16 0921  11/26/16 0214 11/26/16 1133 11/26/16 2148 11/26/16 2321  HGB  --   --   --  10.5*  --   --   --   HCT  --   --   --  33.1*  --   --   --   PLT  --   --   --  179  --   --   --   LABPROT 15.8*  --   --   --   --   --   --   INR 1.25  --   --   --   --   --   --   HEPARINUNFRC  --   --   < > 0.30 0.83* 1.08* 1.08*  CREATININE  --  1.42*  --  1.72*  --   --   --   < > = values in this interval not displayed.  Estimated Creatinine Clearance: 26.3 mL/min (by C-G formula based on SCr of 1.72 mg/dL (H)).   Medical History: Past Medical History:  Diagnosis Date  . Anginal pain ([place])   . Arthritis   . Coronary artery disease   . Diabetes mellitus without complication ([place])   . Dysrhythmia    atrail fibrillation  . GERD (gastroesophageal reflux disease)   . Glaucoma    bilat   . Hyperlipidemia   . Hypertension   . Kidney stone   . Urinary tract infection   . Wears glasses     Assessment: 76 year old female on chronic Coumadin prior to admission for atrial fibrillation which has been held for possible CABG on 1/11 now s/p cardiac cath on heparin. Heparin level is high-drawn from opposite arm   Goal of Therapy:  Heparin level 0.3-0.7 units/ml Monitor platelets by anticoagulation protocol: Yes   Plan:   -Hold heparin x 1 hr -Re-start heparin at 600 units/hr at [place], PharmD, [place] Pharmacist Phone: [phone]

## 2016-11-27 NOTE — Progress Notes (Signed)
ANTICOAGULATION CONSULT NOTE - FOLLOW UP    HL = 0.61 (goal 0.3 - 0.7 units/mL) Heparin dosing weight = 70 kg   Assessment: 75 YOM on chronic Coumadin PTA for history of Afib.  Patient was transitioned to IV heparin for cath and now planned CABG/MAZE on 11/28/16.    Heparin level reduced to therapeutic range but remains toward the high end of normal.  No bleeding reported.   Plan: - Reduce heparin gtt slightly to 400 units/hr (hold at 0500 on 11/28/16) - F/U post CABG   Angus Amini D. Mina Marble, PharmD, BCPS 11/27/2016, 7:03 PM

## 2016-11-27 NOTE — Progress Notes (Signed)
ANTICOAGULATION CONSULT NOTE   Pharmacy Consult for Heparin Indication: atrial fibrillation   No Known Allergies  Patient Measurements: Height: 5\' 4"  (162.6 cm) Weight: 156 lb 8.4 oz (71 kg) IBW/kg (Calculated) : 54.7 Heparin Dosing Weight: 69.6 kg  Vital Signs: Temp: 97.7 F (36.5 C) (01/10 0810) Temp Source: Oral (01/10 0810) BP: 135/61 (01/10 1005) Pulse Rate: 71 (01/10 0810)  Labs:  Recent Labs  11/25/16 0640 11/25/16 0921 11/26/16 0214  11/26/16 2148 11/26/16 2321 11/27/16 0244 11/27/16 1012  HGB  --   --  10.5*  --   --   --  11.0*  --   HCT  --   --  33.1*  --   --   --  34.3*  --   PLT  --   --  179  --   --   --  171  --   LABPROT 15.8*  --   --   --   --   --   --   --   INR 1.25  --   --   --   --   --   --   --   HEPARINUNFRC  --   --  0.30  < > 1.08* 1.08*  --  0.89*  CREATININE  --  1.42* 1.72*  --   --   --  1.67*  --   < > = values in this interval not displayed.  Estimated Creatinine Clearance: 27.3 mL/min (by C-G formula based on SCr of 1.67 mg/dL (H)).   Medical History: Past Medical History:  Diagnosis Date  . Anginal pain ([place])   . Arthritis   . Coronary artery disease   . Diabetes mellitus without complication ([place])   . Dysrhythmia    atrail fibrillation  . GERD (gastroesophageal reflux disease)   . Glaucoma    bilat   . Hyperlipidemia   . Hypertension   . Kidney stone   . Urinary tract infection   . Wears glasses     Assessment: 76 year old female on chronic Coumadin prior to admission for atrial fibrillation which has been held for possible CABG on 1/11 - now s/p cardiac cath on heparin. Heparin level decreased but remains high 0.89 on reduced rate - drawing from opposite arm. CBC stable, no bleed or IV line issues per RN.   Goal of Therapy:  Heparin level 0.3-0.7 units/ml Monitor platelets by anticoagulation protocol: Yes   Plan:   Decrease heparin to 450 units/h 8h heparin level Daily heparin level/CBC Monitor for s/sx  bleeding CABG 1/11  Elicia Lamp, PharmD, BCPS Clinical Pharmacist 11/27/2016 11:25 AM

## 2016-11-28 ENCOUNTER — Encounter (HOSPITAL_COMMUNITY)
Admission: AD | Disposition: A | Discharge status: Home Health | Payer: Self-pay | Source: Ambulatory Visit | Attending: Thoracic Surgery (Cardiothoracic Vascular Surgery)

## 2016-11-28 ENCOUNTER — Inpatient Hospital Stay (HOSPITAL_COMMUNITY): Payer: Medicare Other | Admitting: Certified Registered Nurse Anesthetist

## 2016-11-28 ENCOUNTER — Encounter (HOSPITAL_COMMUNITY): Payer: Medicare Other

## 2016-11-28 ENCOUNTER — Inpatient Hospital Stay (HOSPITAL_COMMUNITY): Payer: Medicare Other

## 2016-11-28 DIAGNOSIS — I25110 Atherosclerotic heart disease of native coronary artery with unstable angina pectoris: Secondary | ICD-10-CM

## 2016-11-28 DIAGNOSIS — Z951 Presence of aortocoronary bypass graft: Secondary | ICD-10-CM

## 2016-11-28 DIAGNOSIS — I481 Persistent atrial fibrillation: Secondary | ICD-10-CM

## 2016-11-28 HISTORY — PX: CORONARY ARTERY BYPASS GRAFT: SHX141

## 2016-11-28 HISTORY — PX: MAZE: SHX5063

## 2016-11-28 HISTORY — PX: ENDOVEIN HARVEST OF GREATER SAPHENOUS VEIN: SHX5059

## 2016-11-28 HISTORY — PX: TEE WITHOUT CARDIOVERSION: SHX5443

## 2016-11-28 LAB — CBC
HCT: 23.9 % — ABNORMAL LOW (ref 36.0–46.0)
HEMATOCRIT: 32.1 % — AB (ref 36.0–46.0)
HEMATOCRIT: 22.7 % — AB (ref 36.0–46.0)
Hemoglobin: 10.4 g/dL — ABNORMAL LOW (ref 12.0–15.0)
Hemoglobin: 7.9 g/dL — ABNORMAL LOW (ref 12.0–15.0)
Hemoglobin: 7.5 g/dL — ABNORMAL LOW (ref 12.0–15.0)
MCH: 27.2 pg (ref 26.0–34.0)
MCH: 27.2 pg (ref 26.0–34.0)
MCH: 27.3 pg (ref 26.0–34.0)
MCHC: 32.4 g/dL (ref 30.0–36.0)
MCHC: 33.1 g/dL (ref 30.0–36.0)
MCHC: 33 g/dL (ref 30.0–36.0)
MCV: 84 fL (ref 78.0–100.0)
MCV: 82.4 fL (ref 78.0–100.0)
MCV: 82.5 fL (ref 78.0–100.0)
PLATELETS: 84 10*3/uL — AB (ref 150–400)
PLATELETS: 102 10*3/uL — AB (ref 150–400)
Platelets: 182 10*3/uL (ref 150–400)
RBC: 3.82 MIL/uL — ABNORMAL LOW (ref 3.87–5.11)
RBC: 2.9 MIL/uL — AB (ref 3.87–5.11)
RBC: 2.75 MIL/uL — ABNORMAL LOW (ref 3.87–5.11)
RDW: 15.8 % — AB (ref 11.5–15.5)
RDW: 15.6 % — ABNORMAL HIGH (ref 11.5–15.5)
RDW: 15.7 % — AB (ref 11.5–15.5)
WBC: 9.3 10*3/uL (ref 4.0–10.5)
WBC: 10.5 10*3/uL (ref 4.0–10.5)
WBC: 11.2 10*3/uL — AB (ref 4.0–10.5)

## 2016-11-28 LAB — POCT I-STAT, CHEM 8
BUN: 26 mg/dL — ABNORMAL HIGH (ref 6–20)
BUN: 19 mg/dL (ref 6–20)
BUN: 24 mg/dL — ABNORMAL HIGH (ref 6–20)
BUN: 24 mg/dL — ABNORMAL HIGH (ref 6–20)
BUN: 24 mg/dL — AB (ref 6–20)
BUN: 22 mg/dL — ABNORMAL HIGH (ref 6–20)
BUN: 18 mg/dL (ref 6–20)
CALCIUM ION: 1.28 mmol/L (ref 1.15–1.40)
CALCIUM ION: 0.93 mmol/L — AB (ref 1.15–1.40)
CALCIUM ION: 1.26 mmol/L (ref 1.15–1.40)
CHLORIDE: 99 mmol/L — AB (ref 101–111)
CHLORIDE: 101 mmol/L (ref 101–111)
CHLORIDE: 107 mmol/L (ref 101–111)
CREATININE: 1.3 mg/dL — AB (ref 0.44–1.00)
CREATININE: 1.2 mg/dL — AB (ref 0.44–1.00)
Calcium, Ion: 1.23 mmol/L (ref 1.15–1.40)
Calcium, Ion: 1 mmol/L — ABNORMAL LOW (ref 1.15–1.40)
Calcium, Ion: 0.99 mmol/L — ABNORMAL LOW (ref 1.15–1.40)
Calcium, Ion: 1.01 mmol/L — ABNORMAL LOW (ref 1.15–1.40)
Chloride: 103 mmol/L (ref 101–111)
Chloride: 104 mmol/L (ref 101–111)
Chloride: 96 mmol/L — ABNORMAL LOW (ref 101–111)
Chloride: 99 mmol/L — ABNORMAL LOW (ref 101–111)
Creatinine, Ser: 0.9 mg/dL (ref 0.44–1.00)
Creatinine, Ser: 1.2 mg/dL — ABNORMAL HIGH (ref 0.44–1.00)
Creatinine, Ser: 1.1 mg/dL — ABNORMAL HIGH (ref 0.44–1.00)
Creatinine, Ser: 1 mg/dL (ref 0.44–1.00)
Creatinine, Ser: 1 mg/dL (ref 0.44–1.00)
GLUCOSE: 136 mg/dL — AB (ref 65–99)
GLUCOSE: 163 mg/dL — AB (ref 65–99)
GLUCOSE: 156 mg/dL — AB (ref 65–99)
Glucose, Bld: 138 mg/dL — ABNORMAL HIGH (ref 65–99)
Glucose, Bld: 149 mg/dL — ABNORMAL HIGH (ref 65–99)
Glucose, Bld: 160 mg/dL — ABNORMAL HIGH (ref 65–99)
Glucose, Bld: 115 mg/dL — ABNORMAL HIGH (ref 65–99)
HCT: 30 % — ABNORMAL LOW (ref 36.0–46.0)
HCT: 23 % — ABNORMAL LOW (ref 36.0–46.0)
HCT: 19 % — ABNORMAL LOW (ref 36.0–46.0)
HCT: 21 % — ABNORMAL LOW (ref 36.0–46.0)
HEMATOCRIT: 19 % — AB (ref 36.0–46.0)
HEMATOCRIT: 19 % — AB (ref 36.0–46.0)
HEMATOCRIT: 18 % — AB (ref 36.0–46.0)
HEMOGLOBIN: 6.5 g/dL — AB (ref 12.0–15.0)
HEMOGLOBIN: 6.5 g/dL — AB (ref 12.0–15.0)
Hemoglobin: 10.2 g/dL — ABNORMAL LOW (ref 12.0–15.0)
Hemoglobin: 7.8 g/dL — ABNORMAL LOW (ref 12.0–15.0)
Hemoglobin: 6.1 g/dL — CL (ref 12.0–15.0)
Hemoglobin: 6.5 g/dL — CL (ref 12.0–15.0)
Hemoglobin: 7.1 g/dL — ABNORMAL LOW (ref 12.0–15.0)
POTASSIUM: 5.1 mmol/L (ref 3.5–5.1)
POTASSIUM: 4.8 mmol/L (ref 3.5–5.1)
POTASSIUM: 4.2 mmol/L (ref 3.5–5.1)
Potassium: 4.2 mmol/L (ref 3.5–5.1)
Potassium: 3.9 mmol/L (ref 3.5–5.1)
Potassium: 3.9 mmol/L (ref 3.5–5.1)
Potassium: 4.8 mmol/L (ref 3.5–5.1)
SODIUM: 139 mmol/L (ref 135–145)
SODIUM: 137 mmol/L (ref 135–145)
SODIUM: 141 mmol/L (ref 135–145)
SODIUM: 136 mmol/L (ref 135–145)
SODIUM: 138 mmol/L (ref 135–145)
SODIUM: 139 mmol/L (ref 135–145)
Sodium: 137 mmol/L (ref 135–145)
TCO2: 27 mmol/L (ref 0–100)
TCO2: 29 mmol/L (ref 0–100)
TCO2: 32 mmol/L (ref 0–100)
TCO2: 31 mmol/L (ref 0–100)
TCO2: 31 mmol/L (ref 0–100)
TCO2: 31 mmol/L (ref 0–100)
TCO2: 23 mmol/L (ref 0–100)

## 2016-11-28 LAB — POCT I-STAT 3, ART BLOOD GAS (G3+)
ACID-BASE EXCESS: 1 mmol/L (ref 0.0–2.0)
ACID-BASE EXCESS: 5 mmol/L — AB (ref 0.0–2.0)
Acid-Base Excess: 7 mmol/L — ABNORMAL HIGH (ref 0.0–2.0)
Acid-Base Excess: 6 mmol/L — ABNORMAL HIGH (ref 0.0–2.0)
Acid-base deficit: 1 mmol/L (ref 0.0–2.0)
BICARBONATE: 28.7 mmol/L — AB (ref 20.0–28.0)
Bicarbonate: 25.5 mmol/L (ref 20.0–28.0)
Bicarbonate: 28.5 mmol/L — ABNORMAL HIGH (ref 20.0–28.0)
Bicarbonate: 29.3 mmol/L — ABNORMAL HIGH (ref 20.0–28.0)
Bicarbonate: 22.4 mmol/L (ref 20.0–28.0)
O2 SAT: 100 %
O2 SAT: 100 %
O2 Saturation: 100 %
O2 Saturation: 100 %
O2 Saturation: 100 %
PCO2 ART: 25.4 mmHg — AB (ref 32.0–48.0)
PCO2 ART: 33.3 mmHg (ref 32.0–48.0)
PCO2 ART: 35.7 mmHg (ref 32.0–48.0)
PCO2 ART: 29.7 mmHg — AB (ref 32.0–48.0)
PH ART: 7.552 — AB (ref 7.350–7.450)
PO2 ART: 392 mmHg — AB (ref 83.0–108.0)
Patient temperature: 35.5
TCO2: 27 mmol/L (ref 0–100)
TCO2: 29 mmol/L (ref 0–100)
TCO2: 30 mmol/L (ref 0–100)
TCO2: 30 mmol/L (ref 0–100)
TCO2: 23 mmol/L (ref 0–100)
pCO2 arterial: 40.9 mmHg (ref 32.0–48.0)
pH, Arterial: 7.403 (ref 7.350–7.450)
pH, Arterial: 7.658 (ref 7.350–7.450)
pH, Arterial: 7.513 — ABNORMAL HIGH (ref 7.350–7.450)
pH, Arterial: 7.48 — ABNORMAL HIGH (ref 7.350–7.450)
pO2, Arterial: 463 mmHg — ABNORMAL HIGH (ref 83.0–108.0)
pO2, Arterial: 438 mmHg — ABNORMAL HIGH (ref 83.0–108.0)
pO2, Arterial: 398 mmHg — ABNORMAL HIGH (ref 83.0–108.0)
pO2, Arterial: 165 mmHg — ABNORMAL HIGH (ref 83.0–108.0)

## 2016-11-28 LAB — HEMOGLOBIN AND HEMATOCRIT, BLOOD
HEMATOCRIT: 18.2 % — AB (ref 36.0–46.0)
Hemoglobin: 6.1 g/dL — CL (ref 12.0–15.0)

## 2016-11-28 LAB — APTT: APTT: 28 s (ref 24–36)

## 2016-11-28 LAB — GLUCOSE, CAPILLARY
GLUCOSE-CAPILLARY: 143 mg/dL — AB (ref 65–99)
GLUCOSE-CAPILLARY: 120 mg/dL — AB (ref 65–99)
Glucose-Capillary: 134 mg/dL — ABNORMAL HIGH (ref 65–99)
Glucose-Capillary: 83 mg/dL (ref 65–99)
Glucose-Capillary: 74 mg/dL (ref 65–99)
Glucose-Capillary: 104 mg/dL — ABNORMAL HIGH (ref 65–99)
Glucose-Capillary: 98 mg/dL (ref 65–99)
Glucose-Capillary: 90 mg/dL (ref 65–99)
Glucose-Capillary: 134 mg/dL — ABNORMAL HIGH (ref 65–99)

## 2016-11-28 LAB — POCT I-STAT 4, (NA,K, GLUC, HGB,HCT)
Glucose, Bld: 108 mg/dL — ABNORMAL HIGH (ref 65–99)
HCT: 21 % — ABNORMAL LOW (ref 36.0–46.0)
HEMOGLOBIN: 7.1 g/dL — AB (ref 12.0–15.0)
Potassium: 4 mmol/L (ref 3.5–5.1)
SODIUM: 141 mmol/L (ref 135–145)

## 2016-11-28 LAB — PREPARE RBC (CROSSMATCH)

## 2016-11-28 LAB — ECHO TEE: LV 3D EF%: 72 % (ref 55–75)

## 2016-11-28 LAB — CREATININE, SERUM
CREATININE: 1.38 mg/dL — AB (ref 0.44–1.00)
GFR calc Af Amer: 42 mL/min — ABNORMAL LOW (ref >60)
GFR, EST NON AFRICAN AMERICAN: 36 mL/min — AB (ref >60)

## 2016-11-28 LAB — MAGNESIUM: Magnesium: 3.2 mg/dL — ABNORMAL HIGH (ref 1.7–2.4)

## 2016-11-28 LAB — PROTIME-INR
INR: 1.51
PROTHROMBIN TIME: 18.4 s — AB (ref 11.4–15.2)

## 2016-11-28 LAB — PLATELET COUNT: PLATELETS: 91 10*3/uL — AB (ref 150–400)

## 2016-11-28 SURGERY — CORONARY ARTERY BYPASS GRAFTING (CABG)
Anesthesia: General | Site: Leg Upper | Laterality: Right

## 2016-11-28 MED ORDER — LACTATED RINGERS IV SOLN
INTRAVENOUS | Status: DC
  Administered 2016-11-28: 15:00:00 via INTRAVENOUS

## 2016-11-28 MED ORDER — FENTANYL CITRATE (PF) 250 MCG/5ML IJ SOLN
INTRAMUSCULAR | Status: DC | PRN
  Administered 2016-11-28 (×2): 250 ug via INTRAVENOUS
  Administered 2016-11-28: 300 ug via INTRAVENOUS
  Administered 2016-11-28: 450 ug via INTRAVENOUS
  Administered 2016-11-28: 250 ug via INTRAVENOUS

## 2016-11-28 MED ORDER — ALBUMIN HUMAN 5 % IV SOLN: 250.0000 mL | INTRAVENOUS | Status: AC | PRN

## 2016-11-28 MED ORDER — DOCUSATE SODIUM 100 MG PO CAPS
200.0000 mg | ORAL_CAPSULE | Freq: Every day | ORAL | Status: DC
  Administered 2016-11-29: 200 mg via ORAL
  Filled 2016-11-28: qty 2

## 2016-11-28 MED ORDER — SODIUM CHLORIDE 0.9 % IV SOLN: INTRAVENOUS | Status: DC

## 2016-11-28 MED ORDER — SODIUM CHLORIDE 0.9 % IV SOLN: 250.0000 mL | INTRAVENOUS | Status: DC

## 2016-11-28 MED ORDER — ONDANSETRON HCL 4 MG/2ML IJ SOLN: 4.0000 mg | Freq: Four times a day (QID) | INTRAMUSCULAR | Status: DC | PRN

## 2016-11-28 MED ORDER — MORPHINE SULFATE (PF) 2 MG/ML IV SOLN
1.0000 mg | INTRAVENOUS | Status: AC | PRN
  Administered 2016-11-28: 2 mg via INTRAVENOUS
  Filled 2016-11-28: qty 1

## 2016-11-28 MED ORDER — LACTATED RINGERS IV SOLN
INTRAVENOUS | Status: DC | PRN
  Administered 2016-11-28 (×4): via INTRAVENOUS

## 2016-11-28 MED ORDER — PROTAMINE SULFATE 10 MG/ML IV SOLN
INTRAVENOUS | Status: AC
  Filled 2016-11-28: qty 25

## 2016-11-28 MED ORDER — PANTOPRAZOLE SODIUM 40 MG PO TBEC: 40.0000 mg | DELAYED_RELEASE_TABLET | Freq: Every day | ORAL | Status: DC

## 2016-11-28 MED ORDER — INSULIN REGULAR BOLUS VIA INFUSION
0.0000 [IU] | Freq: Three times a day (TID) | INTRAVENOUS | Status: DC
  Administered 2016-11-29: 2 [IU] via INTRAVENOUS
  Filled 2016-11-28: qty 10

## 2016-11-28 MED ORDER — SODIUM CHLORIDE 0.9 % IV SOLN
30.0000 meq | Freq: Once | INTRAVENOUS | Status: DC
  Filled 2016-11-28: qty 15

## 2016-11-28 MED ORDER — SODIUM CHLORIDE 0.9 % IJ SOLN
INTRAMUSCULAR | Status: DC | PRN
  Administered 2016-11-28: 4 mL via TOPICAL

## 2016-11-28 MED ORDER — METOPROLOL TARTRATE 5 MG/5ML IV SOLN
2.5000 mg | INTRAVENOUS | Status: DC | PRN
  Administered 2016-12-03 (×2): 2.5 mg via INTRAVENOUS
  Filled 2016-11-28: qty 5

## 2016-11-28 MED ORDER — MORPHINE SULFATE (PF) 2 MG/ML IV SOLN: 2.0000 mg | INTRAVENOUS | Status: DC | PRN

## 2016-11-28 MED ORDER — SODIUM CHLORIDE 0.9 % IJ SOLN
INTRAMUSCULAR | Status: DC | PRN
  Administered 2016-11-28: 8 mL via TOPICAL

## 2016-11-28 MED ORDER — CHLORHEXIDINE GLUCONATE 0.12% ORAL RINSE (MEDLINE KIT)
15.0000 mL | Freq: Two times a day (BID) | OROMUCOSAL | Status: DC
  Administered 2016-11-28: 15 mL via OROMUCOSAL

## 2016-11-28 MED ORDER — 0.9 % SODIUM CHLORIDE (POUR BTL) OPTIME
TOPICAL | Status: DC | PRN
  Administered 2016-11-28: 1000 mL
  Administered 2016-11-28: 5000 mL

## 2016-11-28 MED ORDER — DEXTROSE 5 % IV SOLN
1.5000 g | Freq: Two times a day (BID) | INTRAVENOUS | Status: AC
  Administered 2016-11-28 – 2016-11-30 (×4): 1.5 g via INTRAVENOUS
  Filled 2016-11-28 (×4): qty 1.5

## 2016-11-28 MED ORDER — ROSUVASTATIN CALCIUM 20 MG PO TABS: 20.0000 mg | ORAL_TABLET | Freq: Every day | ORAL | Status: DC

## 2016-11-28 MED ORDER — HEPARIN SODIUM (PORCINE) 1000 UNIT/ML IJ SOLN
INTRAMUSCULAR | Status: AC
  Filled 2016-11-28: qty 1

## 2016-11-28 MED ORDER — OXYCODONE HCL 5 MG PO TABS: 5.0000 mg | ORAL_TABLET | ORAL | Status: DC | PRN

## 2016-11-28 MED ORDER — MIDAZOLAM HCL 10 MG/2ML IJ SOLN
INTRAMUSCULAR | Status: AC
  Filled 2016-11-28: qty 2

## 2016-11-28 MED ORDER — SODIUM CHLORIDE 0.9 % IV SOLN
INTRAVENOUS | Status: DC
  Administered 2016-11-29: 1.7 [IU]/h via INTRAVENOUS
  Filled 2016-11-28 (×2): qty 2.5

## 2016-11-28 MED ORDER — CALCIUM CHLORIDE 10 % IV SOLN
INTRAVENOUS | Status: AC
  Filled 2016-11-28: qty 10

## 2016-11-28 MED ORDER — TRAMADOL HCL 50 MG PO TABS: 50.0000 mg | ORAL_TABLET | ORAL | Status: DC | PRN

## 2016-11-28 MED ORDER — BISACODYL 5 MG PO TBEC
10.0000 mg | DELAYED_RELEASE_TABLET | Freq: Every day | ORAL | Status: DC
  Administered 2016-11-29: 10 mg via ORAL
  Filled 2016-11-28: qty 2

## 2016-11-28 MED ORDER — HEPARIN SODIUM (PORCINE) 1000 UNIT/ML IJ SOLN
INTRAMUSCULAR | Status: DC | PRN
  Administered 2016-11-28: 19000 [IU] via INTRAVENOUS
  Administered 2016-11-28: 2000 [IU] via INTRAVENOUS

## 2016-11-28 MED ORDER — CALCIUM CHLORIDE 10 % IV SOLN
INTRAVENOUS | Status: DC | PRN
  Administered 2016-11-28 (×2): 200 mg via INTRAVENOUS

## 2016-11-28 MED ORDER — PROPOFOL 10 MG/ML IV BOLUS
INTRAVENOUS | Status: AC
  Filled 2016-11-28: qty 20

## 2016-11-28 MED ORDER — SODIUM CHLORIDE 0.9% FLUSH: 3.0000 mL | INTRAVENOUS | Status: DC | PRN

## 2016-11-28 MED ORDER — ACETAMINOPHEN 160 MG/5ML PO SOLN
1000.0000 mg | Freq: Four times a day (QID) | ORAL | Status: DC
  Administered 2016-11-29: 1000 mg
  Filled 2016-11-28: qty 40.6

## 2016-11-28 MED ORDER — ASPIRIN EC 325 MG PO TBEC
325.0000 mg | DELAYED_RELEASE_TABLET | Freq: Every day | ORAL | Status: DC
  Administered 2016-11-29: 325 mg via ORAL
  Filled 2016-11-28: qty 1

## 2016-11-28 MED ORDER — PROPOFOL 10 MG/ML IV BOLUS
INTRAVENOUS | Status: DC | PRN
  Administered 2016-11-28: 110 mg via INTRAVENOUS

## 2016-11-28 MED ORDER — ORAL CARE MOUTH RINSE
15.0000 mL | OROMUCOSAL | Status: DC
  Administered 2016-11-28 – 2016-11-29 (×4): 15 mL via OROMUCOSAL

## 2016-11-28 MED ORDER — PROTAMINE SULFATE 10 MG/ML IV SOLN
INTRAVENOUS | Status: DC | PRN
  Administered 2016-11-28 (×3): 50 mg via INTRAVENOUS
  Administered 2016-11-28: 10 mg via INTRAVENOUS
  Administered 2016-11-28: 50 mg via INTRAVENOUS

## 2016-11-28 MED ORDER — LACTATED RINGERS IV SOLN
500.0000 mL | Freq: Once | INTRAVENOUS | Status: AC | PRN
  Administered 2016-11-28: 500 mL via INTRAVENOUS

## 2016-11-28 MED ORDER — MIDAZOLAM HCL 2 MG/2ML IJ SOLN
2.0000 mg | INTRAMUSCULAR | Status: DC | PRN
Start: 2016-11-28 — End: 2016-11-30

## 2016-11-28 MED ORDER — FAMOTIDINE IN NACL 20-0.9 MG/50ML-% IV SOLN
20.0000 mg | Freq: Two times a day (BID) | INTRAVENOUS | Status: AC
  Administered 2016-11-28 (×2): 20 mg via INTRAVENOUS
  Filled 2016-11-28: qty 50

## 2016-11-28 MED ORDER — ACETAMINOPHEN 650 MG RE SUPP
650.0000 mg | Freq: Once | RECTAL | Status: AC
  Administered 2016-11-28: 650 mg via RECTAL

## 2016-11-28 MED ORDER — ALBUMIN HUMAN 5 % IV SOLN
INTRAVENOUS | Status: DC | PRN
  Administered 2016-11-28 (×2): via INTRAVENOUS

## 2016-11-28 MED ORDER — SODIUM CHLORIDE 0.9 % IV SOLN: 10.0000 mL/h | Freq: Once | INTRAVENOUS | Status: DC

## 2016-11-28 MED ORDER — TRANEXAMIC ACID 1000 MG/10ML IV SOLN
INTRAVENOUS | Status: DC
  Filled 2016-11-28: qty 100

## 2016-11-28 MED ORDER — ACETAMINOPHEN 500 MG PO TABS
1000.0000 mg | ORAL_TABLET | Freq: Four times a day (QID) | ORAL | Status: DC
  Administered 2016-11-29 – 2016-11-30 (×4): 1000 mg via ORAL
  Filled 2016-11-28 (×4): qty 2

## 2016-11-28 MED ORDER — SODIUM CHLORIDE 0.45 % IV SOLN
INTRAVENOUS | Status: DC | PRN
  Administered 2016-11-28: 15:00:00 via INTRAVENOUS

## 2016-11-28 MED ORDER — SODIUM CHLORIDE 0.9% FLUSH
3.0000 mL | Freq: Two times a day (BID) | INTRAVENOUS | Status: DC
  Administered 2016-11-29 – 2016-12-03 (×8): 3 mL via INTRAVENOUS

## 2016-11-28 MED ORDER — ASPIRIN 81 MG PO CHEW: 324.0000 mg | CHEWABLE_TABLET | Freq: Every day | ORAL | Status: DC

## 2016-11-28 MED ORDER — CALCIUM CHLORIDE 10 % IV SOLN
1.0000 g | Freq: Once | INTRAVENOUS | Status: AC
  Administered 2016-11-28: 1 g via INTRAVENOUS

## 2016-11-28 MED ORDER — VANCOMYCIN HCL IN DEXTROSE 1-5 GM/200ML-% IV SOLN
1000.0000 mg | Freq: Once | INTRAVENOUS | Status: AC
  Administered 2016-11-28: 1000 mg via INTRAVENOUS
  Filled 2016-11-28: qty 200

## 2016-11-28 MED ORDER — BISACODYL 10 MG RE SUPP: 10.0000 mg | Freq: Every day | RECTAL | Status: DC

## 2016-11-28 MED ORDER — FENTANYL CITRATE (PF) 250 MCG/5ML IJ SOLN
INTRAMUSCULAR | Status: AC
  Filled 2016-11-28: qty 5

## 2016-11-28 MED ORDER — DEXTROSE 5 % IV SOLN
0.0000 ug/min | INTRAVENOUS | Status: DC
  Filled 2016-11-28: qty 2

## 2016-11-28 MED ORDER — DEXMEDETOMIDINE HCL IN NACL 200 MCG/50ML IV SOLN
0.0000 ug/kg/h | INTRAVENOUS | Status: DC
Start: 2016-11-28 — End: 2016-11-30

## 2016-11-28 MED ORDER — FENTANYL CITRATE (PF) 250 MCG/5ML IJ SOLN
INTRAMUSCULAR | Status: AC
  Filled 2016-11-28: qty 20

## 2016-11-28 MED ORDER — LACTATED RINGERS IV SOLN
INTRAVENOUS | Status: DC
  Administered 2016-11-29: 23:00:00 via INTRAVENOUS

## 2016-11-28 MED ORDER — MAGNESIUM SULFATE 4 GM/100ML IV SOLN
4.0000 g | Freq: Once | INTRAVENOUS | Status: AC
  Administered 2016-11-28: 4 g via INTRAVENOUS
  Filled 2016-11-28: qty 100

## 2016-11-28 MED ORDER — LIDOCAINE HCL (CARDIAC) 20 MG/ML IV SOLN
INTRAVENOUS | Status: DC | PRN
  Administered 2016-11-28: 80 mg via INTRATRACHEAL

## 2016-11-28 MED ORDER — CHLORHEXIDINE GLUCONATE 0.12 % MT SOLN
15.0000 mL | OROMUCOSAL | Status: AC
  Administered 2016-11-28: 15 mL via OROMUCOSAL
  Filled 2016-11-28: qty 15

## 2016-11-28 MED ORDER — HEMOSTATIC AGENTS (NO CHARGE) OPTIME
TOPICAL | Status: DC | PRN
  Administered 2016-11-28: 1 via TOPICAL

## 2016-11-28 MED ORDER — NITROGLYCERIN IN D5W 200-5 MCG/ML-% IV SOLN: 0.0000 ug/min | INTRAVENOUS | Status: DC

## 2016-11-28 MED ORDER — METOPROLOL TARTRATE 25 MG/10 ML ORAL SUSPENSION: 12.5000 mg | Freq: Two times a day (BID) | ORAL | Status: DC

## 2016-11-28 MED ORDER — ALBUMIN HUMAN 5 % IV SOLN
250.0000 mL | INTRAVENOUS | Status: AC | PRN
  Administered 2016-11-28 (×4): 250 mL via INTRAVENOUS
  Filled 2016-11-28 (×2): qty 250

## 2016-11-28 MED ORDER — ROCURONIUM BROMIDE 100 MG/10ML IV SOLN
INTRAVENOUS | Status: DC | PRN
  Administered 2016-11-28: 100 mg via INTRAVENOUS
  Administered 2016-11-28: 25 mg via INTRAVENOUS

## 2016-11-28 MED ORDER — PHENYLEPHRINE HCL 10 MG/ML IJ SOLN
INTRAVENOUS | Status: DC | PRN
  Administered 2016-11-28: 20 ug/min via INTRAVENOUS

## 2016-11-28 MED ORDER — METOPROLOL TARTRATE 12.5 MG HALF TABLET: 12.5000 mg | ORAL_TABLET | Freq: Two times a day (BID) | ORAL | Status: DC

## 2016-11-28 MED ORDER — ACETAMINOPHEN 160 MG/5ML PO SOLN: 650.0000 mg | Freq: Once | ORAL | Status: AC

## 2016-11-28 MED ORDER — MIDAZOLAM HCL 5 MG/ML IJ SOLN
INTRAMUSCULAR | Status: DC | PRN
  Administered 2016-11-28 (×2): 2 mg via INTRAVENOUS
  Administered 2016-11-28: 3 mg via INTRAVENOUS

## 2016-11-28 MED FILL — Magnesium Sulfate Inj 50%: INTRAMUSCULAR | Qty: 10 | Status: AC

## 2016-11-28 MED FILL — Heparin Sodium (Porcine) Inj 1000 Unit/ML: INTRAMUSCULAR | Qty: 30 | Status: AC

## 2016-11-28 MED FILL — Potassium Chloride Inj 2 mEq/ML: INTRAVENOUS | Qty: 40 | Status: AC

## 2016-11-28 SURGICAL SUPPLY — 109 items
ADAPTER CARDIO PERF ANTE/RETRO (ADAPTER) ×2 IMPLANT
ADPR PRFSN 84XANTGRD RTRGD (ADAPTER) ×3
BAG DECANTER FOR FLEXI CONT (MISCELLANEOUS) ×5 IMPLANT
BANDAGE ACE 4X5 VEL STRL LF (GAUZE/BANDAGES/DRESSINGS) ×5 IMPLANT
BANDAGE ACE 6X5 VEL STRL LF (GAUZE/BANDAGES/DRESSINGS) ×5 IMPLANT
BASKET HEART  (ORDER IN 25'S) (MISCELLANEOUS) ×1
BASKET HEART (ORDER IN 25'S) (MISCELLANEOUS) ×1
BASKET HEART (ORDER IN 25S) (MISCELLANEOUS) ×3 IMPLANT
BLADE STERNUM SYSTEM 6 (BLADE) ×5 IMPLANT
BNDG GAUZE ELAST 4 BULKY (GAUZE/BANDAGES/DRESSINGS) ×5 IMPLANT
CANISTER SUCTION 2500CC (MISCELLANEOUS) ×5 IMPLANT
CANN PRFSN 3/8XRT ANG TPR 14 (MISCELLANEOUS) ×3
CANNULA EZ GLIDE AORTIC 21FR (CANNULA) ×5 IMPLANT
CANNULA GUNDRY RCSP 15FR (MISCELLANEOUS) ×2 IMPLANT
CANNULA PRFSN 3/8XRT ANG TPR14 (MISCELLANEOUS) IMPLANT
CANNULA SUMP PERICARDIAL (CANNULA) ×2 IMPLANT
CANNULA VEN MTL TIP RT (MISCELLANEOUS) ×5
CANNULA VRC MALB SNGL STG 36FR (MISCELLANEOUS) IMPLANT
CARDIOBLATE CARDIAC ABLATION (MISCELLANEOUS)
CATH CPB KIT HENDRICKSON (MISCELLANEOUS) ×5 IMPLANT
CATH ROBINSON RED A/P 18FR (CATHETERS) ×5 IMPLANT
CATH THORACIC 36FR (CATHETERS) ×5 IMPLANT
CATH THORACIC 36FR RT ANG (CATHETERS) ×5 IMPLANT
CLAMP ISOLATOR SYNERGY LG (MISCELLANEOUS) ×2 IMPLANT
CLIP FOGARTY SPRING 6M (CLIP) ×2 IMPLANT
CLIP TI MEDIUM 24 (CLIP) IMPLANT
CLIP TI WIDE RED SMALL 24 (CLIP) ×2 IMPLANT
CONN 1/2X1/2X1/2  BEN (MISCELLANEOUS) ×2
CONN 1/2X1/2X1/2 BEN (MISCELLANEOUS) IMPLANT
CONN 3/8X1/2 ST GISH (MISCELLANEOUS) ×4 IMPLANT
CRADLE DONUT ADULT HEAD (MISCELLANEOUS) ×5 IMPLANT
DEVICE CARDIOBLATE CARDIAC ABL (MISCELLANEOUS) IMPLANT
DRAPE CARDIOVASCULAR INCISE (DRAPES) ×5
DRAPE SLUSH/WARMER DISC (DRAPES) ×5 IMPLANT
DRAPE SRG 135X102X78XABS (DRAPES) ×3 IMPLANT
DRSG COVADERM 4X14 (GAUZE/BANDAGES/DRESSINGS) ×5 IMPLANT
ELECT REM PT RETURN 9FT ADLT (ELECTROSURGICAL) ×10
ELECTRODE REM PT RTRN 9FT ADLT (ELECTROSURGICAL) ×6 IMPLANT
FELT TEFLON 1X6 (MISCELLANEOUS) ×10 IMPLANT
GAUZE SPONGE 4X4 12PLY STRL (GAUZE/BANDAGES/DRESSINGS) ×10 IMPLANT
GLOVE BIO SURGEON STRL SZ 6 (GLOVE) ×2 IMPLANT
GLOVE BIOGEL M 7.0 STRL (GLOVE) ×2 IMPLANT
GLOVE BIOGEL PI IND STRL 6 (GLOVE) IMPLANT
GLOVE BIOGEL PI IND STRL 6.5 (GLOVE) IMPLANT
GLOVE BIOGEL PI INDICATOR 6 (GLOVE) ×2
GLOVE BIOGEL PI INDICATOR 6.5 (GLOVE) ×8
GLOVE SS N UNI LF 6.0 STRL (GLOVE) ×4 IMPLANT
GLOVE SURG SIGNA 7.5 PF LTX (GLOVE) ×15 IMPLANT
GOWN STRL REUS W/ TWL LRG LVL3 (GOWN DISPOSABLE) ×12 IMPLANT
GOWN STRL REUS W/ TWL XL LVL3 (GOWN DISPOSABLE) ×6 IMPLANT
GOWN STRL REUS W/TWL LRG LVL3 (GOWN DISPOSABLE) ×20
GOWN STRL REUS W/TWL XL LVL3 (GOWN DISPOSABLE) ×10
HEMOSTAT POWDER SURGIFOAM 1G (HEMOSTASIS) ×15 IMPLANT
HEMOSTAT SURGICEL 2X14 (HEMOSTASIS) ×5 IMPLANT
INSERT FOGARTY XLG (MISCELLANEOUS) IMPLANT
KIT BASIN OR (CUSTOM PROCEDURE TRAY) ×5 IMPLANT
KIT ROOM TURNOVER OR (KITS) ×5 IMPLANT
KIT SUCTION CATH 14FR (SUCTIONS) ×10 IMPLANT
KIT VASOVIEW HEMOPRO VH 3000 (KITS) ×5 IMPLANT
LINE VENT (MISCELLANEOUS) ×2 IMPLANT
LOOP VESSEL SUPERMAXI WHITE (MISCELLANEOUS) ×2 IMPLANT
MARKER GRAFT CORONARY BYPASS (MISCELLANEOUS) ×15 IMPLANT
NS IRRIG 1000ML POUR BTL (IV SOLUTION) ×25 IMPLANT
PACK OPEN HEART (CUSTOM PROCEDURE TRAY) ×5 IMPLANT
PAD ARMBOARD 7.5X6 YLW CONV (MISCELLANEOUS) ×10 IMPLANT
PAD ELECT DEFIB RADIOL ZOLL (MISCELLANEOUS) ×5 IMPLANT
PENCIL BUTTON HOLSTER BLD 10FT (ELECTRODE) ×5 IMPLANT
PROBE CRYO2-ABLATION MALLABLE (MISCELLANEOUS) IMPLANT
PUNCH AORTIC ROT 4.0MM RCL 40 (MISCELLANEOUS) ×2 IMPLANT
PUNCH AORTIC ROTATE 4.0MM (MISCELLANEOUS) IMPLANT
PUNCH AORTIC ROTATE 4.5MM 8IN (MISCELLANEOUS) ×2 IMPLANT
PUNCH AORTIC ROTATE 5MM 8IN (MISCELLANEOUS) IMPLANT
SET CARDIOPLEGIA MPS 5001102 (MISCELLANEOUS) ×2 IMPLANT
SPONGE GAUZE 4X4 12PLY STER LF (GAUZE/BANDAGES/DRESSINGS) ×4 IMPLANT
SUT BONE WAX W31G (SUTURE) ×5 IMPLANT
SUT ETHIBOND 2 0 SH (SUTURE) ×5
SUT ETHIBOND 2 0 SH 36X2 (SUTURE) IMPLANT
SUT MNCRL AB 4-0 PS2 18 (SUTURE) ×2 IMPLANT
SUT PROLENE 3 0 SH DA (SUTURE) ×5 IMPLANT
SUT PROLENE 4 0 RB 1 (SUTURE) ×30
SUT PROLENE 4 0 SH DA (SUTURE) ×6 IMPLANT
SUT PROLENE 4-0 RB1 .5 CRCL 36 (SUTURE) IMPLANT
SUT PROLENE 6 0 C 1 30 (SUTURE) ×14 IMPLANT
SUT PROLENE 7 0 BV1 MDA (SUTURE) ×7 IMPLANT
SUT PROLENE 8 0 BV175 6 (SUTURE) ×2 IMPLANT
SUT STEEL 6MS V (SUTURE) ×5 IMPLANT
SUT STEEL STERNAL CCS#1 18IN (SUTURE) IMPLANT
SUT STEEL SZ 6 DBL 3X14 BALL (SUTURE) ×5 IMPLANT
SUT VIC AB 1 CTX 36 (SUTURE) ×10
SUT VIC AB 1 CTX36XBRD ANBCTR (SUTURE) ×6 IMPLANT
SUT VIC AB 2-0 CT1 27 (SUTURE) ×5
SUT VIC AB 2-0 CT1 TAPERPNT 27 (SUTURE) IMPLANT
SUT VIC AB 2-0 CTX 27 (SUTURE) ×2 IMPLANT
SUT VIC AB 3-0 SH 27 (SUTURE)
SUT VIC AB 3-0 SH 27X BRD (SUTURE) IMPLANT
SUT VIC AB 3-0 X1 27 (SUTURE) IMPLANT
SUT VICRYL 4-0 PS2 18IN ABS (SUTURE) IMPLANT
SUTURE E-PAK OPEN HEART (SUTURE) ×5 IMPLANT
SYSTEM SAHARA CHEST DRAIN ATS (WOUND CARE) ×5 IMPLANT
TAPE CLOTH SURG 6X10 WHT LF (GAUZE/BANDAGES/DRESSINGS) ×2 IMPLANT
TAPE PAPER 3X10 WHT MICROPORE (GAUZE/BANDAGES/DRESSINGS) ×2 IMPLANT
TOWEL OR 17X24 6PK STRL BLUE (TOWEL DISPOSABLE) ×10 IMPLANT
TOWEL OR 17X26 10 PK STRL BLUE (TOWEL DISPOSABLE) ×10 IMPLANT
TRAY FOLEY IC TEMP SENS 16FR (CATHETERS) ×5 IMPLANT
TUBE FEEDING 8FR 16IN STR KANG (MISCELLANEOUS) ×5 IMPLANT
TUBING INSUFFLATION (TUBING) ×5 IMPLANT
UNDERPAD 30X30 (UNDERPADS AND DIAPERS) ×5 IMPLANT
VRC MALLEABLE SINGLE STG 36FR (MISCELLANEOUS) ×5
WATER STERILE IRR 1000ML POUR (IV SOLUTION) ×10 IMPLANT

## 2016-11-28 NOTE — Progress Notes (Signed)
*  PRELIMINARY RESULTS* 2D Echocardiogram has been performed.  Audrey Mullen 11/28/2016, 9:03 AM

## 2016-11-28 NOTE — Progress Notes (Signed)
Rapid wean protocol initiated by RT and this RN. Will continue to closely monitor.  Sherlie Ban, RN

## 2016-11-28 NOTE — Progress Notes (Signed)
RT and this RN checked pt respiratory status under vent. Pt with no respiratory drive on [id number], 5 PEEP, 4 RR vent settings. RT turned vent RR back to 12. RT to periodically assess pt and vent settings. Will continue to monitor.  Sherlie Ban, RN

## 2016-11-28 NOTE — Progress Notes (Signed)
RT NOTE:  Rapid wean protocol initiated. Pt following commands. RT & RN will monitor

## 2016-11-28 NOTE — Brief Op Note (Addendum)
11/25/2016 - 11/28/2016  12:21 PM  PATIENT:  Audrey Mullen  75 y.o. female  PRE-OPERATIVE DIAGNOSIS:  Coronary Artery Disease A-FIB  POST-OPERATIVE DIAGNOSIS:  Coronary Artery Disease A-FIB  PROCEDURE:  Procedure(s): CORONARY ARTERY BYPASS GRAFTING (CABG) x 4 (N/A) MAZE (N/A) TRANSESOPHAGEAL ECHOCARDIOGRAM (TEE) (N/A) ENDOVEIN HARVEST OF GREATER SAPHENOUS VEIN (Right)   LIMA to LAD  SVG to Om1  SVG to Diag 2  SVG to PDA   SURGEON:  Surgeon(s) and Role:    * Melrose Nakayama, MD - Primary  PHYSICIAN ASSISTANT:  Nicholes Rough, PA-C   ANESTHESIA:   general  EBL:  Total I/O In: -  Out: 1500 [Urine:1500]  BLOOD ADMINISTERED:none  DRAINS: ROUTINE   LOCAL MEDICATIONS USED:  NONE  SPECIMEN:  No Specimen  DISPOSITION OF SPECIMEN:  N/A  COUNTS:  YES  PLAN OF CARE: Admit to inpatient   PATIENT DISPOSITION:  ICU - intubated and hemodynamically stable.   Delay start of Pharmacological VTE agent (>24hrs) due to surgical blood loss or risk of bleeding: yes  XC= 106 min CPB= 147 min Good targets and conduits. LIMA small but good flow

## 2016-11-28 NOTE — Anesthesia Procedure Notes (Signed)
Central Venous Catheter Insertion Performed by: Duane Boston, anesthesiologist Start/End1/09/2017 7:07 AM, 11/28/2016 7:17 AM Patient location: Pre-op. Preanesthetic checklist: patient identified, IV checked, site marked, risks and benefits discussed, surgical consent, monitors and equipment checked, pre-op evaluation, timeout performed and anesthesia consent Position: Trendelenburg Lidocaine 1% used for infiltration and patient sedated Hand hygiene performed , maximum sterile barriers used  and Seldinger technique used Catheter size: 8.5 Fr Total catheter length 8. PA cath was placed.Sheath introducer Swan type:thermodilution PA Cath depth:50 Procedure performed using ultrasound guided technique. Ultrasound Notes:anatomy identified, needle tip was noted to be adjacent to the nerve/plexus identified, no ultrasound evidence of intravascular and/or intraneural injection and image(s) printed for medical record Attempts: 1 Following insertion, line sutured and dressing applied. Post procedure assessment: free fluid flow, blood return through all ports and no air  Patient tolerated the procedure well with no immediate complications.

## 2016-11-28 NOTE — Progress Notes (Signed)
CT Surgery  Recovering from CABG Opens eyes  Hemodynamics stable excellent u/o- more albumin ordered Hct 24%

## 2016-11-28 NOTE — Progress Notes (Signed)
  Echocardiogram Echocardiogram Transesophageal has been performed.  Audrey Mullen L Androw 11/28/2016, 11:51 AM

## 2016-11-28 NOTE — Progress Notes (Signed)
Pt too sleepy to wean at this time, vent alarming. RT notified, placed back on full support. Will reattempt later.  Sherlie Ban, RN

## 2016-11-28 NOTE — Progress Notes (Signed)
RT NOTE:  Rapid Wean protocol initiated.

## 2016-11-28 NOTE — Interval H&P Note (Signed)
History and Physical Interval Note: For CABG, left sided Maze today  11/28/2016 7:34 AM  Audrey Mullen  has presented today for surgery, with the diagnosis of CAD AFIB  The various methods of treatment have been discussed with the patient and family. After consideration of risks, benefits and other options for treatment, the patient has consented to  Procedure(s): CORONARY ARTERY BYPASS GRAFTING (CABG) (N/A) POSSIBLE MAZE (N/A) TRANSESOPHAGEAL ECHOCARDIOGRAM (TEE) (N/A) as a surgical intervention .  The patient's history has been reviewed, patient examined, no change in status, stable for surgery.  I have reviewed the patient's chart and labs.  Questions were answered to the patient's satisfaction.     Melrose Nakayama

## 2016-11-28 NOTE — Progress Notes (Signed)
RT NOTE:  Rapid wean terminated. Pt still sleepy. RR < 8bpm. Will attempt again @ later time.

## 2016-11-28 NOTE — H&P (View-Only) (Signed)
Reason for Consult:Left main/ 3 vessel CAD Referring Physician: Dr. Gwenlyn Found Cards= Dr. Rozanna Boer Audrey Mullen is an 76 y.o. female.  HPI: 76 yo woman with a cc/o exertional CP  Audrey Mullen is a 76 yo woman with a past history of CAD, paroxysmal atrial fibrillation, diabetes mellitus (per chart, she denies), hypertension, hyperlipidemia, glaucoma, arthritis and GERD. She was hospitalized in May 2017 with urosepsis. During that admission she had atrial fib with a RVR. She also had an elevated troponin. An echocardiogram showed an EF of 55-60%. She was in SR at dc.  Over the past couple of months she has been having exertional chest tightness associated with shortness of breath. No radiation and no N/V or diaphoresis. Relieved by rest. No nocturnal or rest symptoms. A stress test showed a reversible defect in the basal inferior wall. Today she had cardiac cath which revealed left main and 3 vessel CAD.   She is currently pain free. She lives at home with her husband. Life long nonsmoker.  Past Medical History:  Diagnosis Date  . Anginal pain ([place])   . Arthritis   . Coronary artery disease   . Diabetes mellitus without complication ([place])   . Dysrhythmia    atrail fibrillation  . GERD (gastroesophageal reflux disease)   . Glaucoma    bilat   . Hyperlipidemia   . Hypertension   . Kidney stone   . Urinary tract infection   . Wears glasses     Past Surgical History:  Procedure Laterality Date  . CARDIAC CATHETERIZATION N/A 11/25/2016   Procedure: Left Heart Cath and Coronary Angiography;  Surgeon: Lorretta Harp, MD;  Location: [facility];  Service: Cardiovascular;  Laterality: N/A;  . CYSTOSCOPY WITH RETROGRADE PYELOGRAM, URETEROSCOPY AND STENT PLACEMENT Left 06/03/2016   Procedure: CYSTOSCOPY WITH LEFT RETROGRADE PYELOGRAM, URETEROSCOPY, REMOVAL OF LEFT NEPHROSTOMY TUBE, INSERTION OF DOUBLE J STENT, LEFT;  Surgeon: Carolan Clines, MD;  Location: [facility];  Service:  Urology;  Laterality: Left;  . EYE SURGERY     cataract removed per left eye   . HOLMIUM LASER APPLICATION Left 6/44/0347   Procedure: HOLMIUM LASER OF STONE ;  Surgeon: Carolan Clines, MD;  Location: [facility];  Service: Urology;  Laterality: Left;  . ROTATOR CUFF REPAIR      Family History  Problem Relation Age of Onset  . CAD Neg Hx     Social History:  reports that she has never smoked. She has never used smokeless tobacco. She reports that she does not drink alcohol or use drugs.  Allergies: No Known Allergies  Medications:  Scheduled: . [START ON 11/26/2016] aspirin EC  81 mg Oral Daily  . carvedilol  3.125 mg Oral BID WC  . [START ON 11/26/2016] diltiazem  240 mg Oral Daily  . isosorbide mononitrate  30 mg Oral Daily  . latanoprost  1 drop Both Eyes QHS  . pantoprazole  40 mg Oral Daily  . [START ON 11/26/2016] predniSONE  7.5 mg Oral Q breakfast  . rosuvastatin  20 mg Oral q1800  . sodium chloride flush  3 mL Intravenous Q12H  . spironolactone  25 mg Oral Daily    Results for orders placed or performed during the hospital encounter of 11/25/16 (from the past 48 hour(s))  Protime-INR     Status: Abnormal   Collection Time: 11/25/16  6:40 AM  Result Value Ref Range   Prothrombin Time 15.8 (H) 11.4 - 15.2 seconds   INR 1.25  Basic metabolic panel     Status: Abnormal   Collection Time: 11/25/16  9:21 AM  Result Value Ref Range   Sodium 139 135 - 145 mmol/L   Potassium 4.9 3.5 - 5.1 mmol/L    Comment: HEMOLYSIS AT THIS LEVEL MAY AFFECT RESULT   Chloride 105 101 - 111 mmol/L   CO2 26 22 - 32 mmol/L   Glucose, Bld 131 (H) 65 - 99 mg/dL   BUN 22 (H) 6 - 20 mg/dL   Creatinine, Ser 1.42 (H) 0.44 - 1.00 mg/dL   Calcium 9.2 8.9 - 10.3 mg/dL   GFR calc non Af Amer 35 (L) >60 mL/min   GFR calc Af Amer 40 (L) >60 mL/min    Comment: (NOTE) The eGFR has been calculated using the CKD EPI equation. This calculation has not been validated in all clinical situations. eGFR's  persistently <60 mL/min signify possible Chronic Kidney Disease.    Anion gap 8 5 - 15  Glucose, capillary     Status: Abnormal   Collection Time: 11/25/16 12:33 PM  Result Value Ref Range   Glucose-Capillary 33 (LL) 65 - 99 mg/dL   Comment 1 Notify RN   Glucose, capillary     Status: Abnormal   Collection Time: 11/25/16 12:35 PM  Result Value Ref Range   Glucose-Capillary 127 (H) 65 - 99 mg/dL    No results found.  Review of Systems  Constitutional: Positive for malaise/fatigue. Negative for chills and fever.  Eyes: Negative for blurred vision and double vision.       Glaucoma  Respiratory: Positive for shortness of breath. Negative for cough and wheezing.   Cardiovascular: Positive for chest pain and palpitations. Negative for orthopnea, claudication and leg swelling.  Gastrointestinal: Positive for heartburn. Negative for blood in stool.  Genitourinary: Negative for dysuria and hematuria.       Kidney stones  Musculoskeletal: Positive for joint pain.  Neurological: Negative for speech change, focal weakness and loss of consciousness.  All other systems reviewed and are negative.  Blood pressure (!) 144/53, pulse (!) 51, temperature 98.1 F (36.7 C), temperature source Oral, resp. rate 17, height 5' 4"  (1.626 m), weight 160 lb (72.6 kg), SpO2 98 %. Physical Exam  Vitals reviewed. Constitutional: She is oriented to person, place, and time. She appears well-developed and well-nourished. No distress.  HENT:  Head: Normocephalic and atraumatic.  Mouth/Throat: No oropharyngeal exudate.  Eyes: Conjunctivae and EOM are normal. No scleral icterus.  Neck: Neck supple. No thyromegaly present.  No carotid bruits  Cardiovascular: Normal rate, regular rhythm, normal heart sounds and intact distal pulses.  Exam reveals no gallop and no friction rub.   No murmur heard. Respiratory: Effort normal and breath sounds normal. No respiratory distress. She has no wheezes. She has no rales.   GI: Soft. She exhibits no distension. There is no tenderness.  Musculoskeletal: She exhibits no edema.  Lymphadenopathy:    She has no cervical adenopathy.  Neurological: She is alert and oriented to person, place, and time. No cranial nerve deficit.  No focal motor deficit  Skin: Skin is warm and dry.   ECHO 04/03/16 Study Conclusions  - Left ventricle: The cavity size was normal. There was mild focal   basal hypertrophy of the septum. Systolic function was normal.   The estimated ejection fraction was in the range of 55% to 60%.   Wall motion was normal; there were no regional wall motion   abnormalities. Left ventricular diastolic function parameters  were normal. - Aortic valve: Trileaflet; moderately thickened, moderately   calcified leaflets. - Left atrium: The atrium was mildly dilated.  CARDIAC CATHETERIZATION 11/25/16 Conclusion     Ost LM lesion, 80 %stenosed.  Mid LAD lesion, 70 %stenosed.  Ost Cx to Prox Cx lesion, 80 %stenosed.  2nd Mrg lesion, 95 %stenosed.  Prox RCA lesion, 90 %stenosed.  Mid RCA lesion, 95 %stenosed.     I personally reviewed the cath images and concur with the findings noted above  Assessment/Plan: 76 yo woman with multiple CRF and history of paroxysmal AF who presents with classic exertional angina. By cath she has left main and 3 vessel CAD. CABG is indicated for survival benefit and relief of symptoms.  I have discussed the general nature of the procedure, the need for general anesthesia, the use of cardiopulmonary bypass, and the incisions to be used with Mrs. Bauza. We discussed the expected hospital stay, overall recovery and short and long term outcomes. I informed her of the indications, risks, benefits and alternatives. She understands the risks include, but are not limited to death, stroke, MI, DVT/PE, bleeding, possible need for transfusion, infections, cardiac arrhythmias, as well as other organ system dysfunction  including respiratory, renal, or GI complications. Increased risk for perioperative atrial fib, transfusions and renal failure  She understands and accepts these risks and agrees to proceed.  We also discussed the possibility of a limited Maze procedure for her atrial fibrillation. We will discuss that further before she decides.  As it now stands the 1st available OR time is Friday 1/12  Melrose Nakayama 11/25/2016, 3:56 PM

## 2016-11-28 NOTE — Transfer of Care (Signed)
Immediate Anesthesia Transfer of Care Note  Patient: Audrey Mullen  Procedure(s) Performed: Procedure(s): CORONARY ARTERY BYPASS GRAFTING (CABG) x 4 (N/A) MAZE (N/A) TRANSESOPHAGEAL ECHOCARDIOGRAM (TEE) (N/A) ENDOVEIN HARVEST OF GREATER SAPHENOUS VEIN (Right)  Patient Location: SICU  Anesthesia Type:General  Level of Consciousness: sedated and unresponsive  Airway & Oxygen Therapy: Patient remains intubated per anesthesia plan and Patient placed on Ventilator (see vital sign flow sheet for setting)  Post-op Assessment: Report given to RN and Post -op Vital signs reviewed and stable  Post vital signs: Reviewed and stable  Last Vitals:  Vitals:   11/28/16 0541 11/28/16 0614  BP: (!) 147/78   Pulse: 69 60  Resp: 16   Temp: 36.7 C     Last Pain:  Vitals:   11/28/16 0541  TempSrc: Oral  PainSc:          Complications: No apparent anesthesia complications

## 2016-11-28 NOTE — Progress Notes (Signed)
RT NOTE:  Rapid wean terminated. Pt still sleepy. RR< 8, low MVE

## 2016-11-28 NOTE — Anesthesia Postprocedure Evaluation (Addendum)
Anesthesia Post Note  Patient: Audrey Mullen  Procedure(s) Performed: Procedure(s) (LRB): CORONARY ARTERY BYPASS GRAFTING (CABG) x 4 (N/A) MAZE (N/A) TRANSESOPHAGEAL ECHOCARDIOGRAM (TEE) (N/A) ENDOVEIN HARVEST OF GREATER SAPHENOUS VEIN (Right)  Patient location during evaluation: SICU Anesthesia Type: General Level of consciousness: patient remains intubated per anesthesia plan Pain management: pain level controlled Vital Signs Assessment: post-procedure vital signs reviewed and stable Respiratory status: patient on ventilator - see flowsheet for VS Cardiovascular status: stable Anesthetic complications: no       Last Vitals:  Vitals:   11/28/16 0614 11/28/16 1407  BP:  (!) 88/63  Pulse: 60 82  Resp:  12  Temp:      Last Pain:  Vitals:   11/28/16 0541  TempSrc: Oral  PainSc:                  Saliou Barnier DANIEL

## 2016-11-28 NOTE — Anesthesia Procedure Notes (Signed)
Procedure Name: Intubation Date/Time: 11/28/2016 8:04 AM Performed by: Mariea Clonts Pre-anesthesia Checklist: Patient identified, Emergency Drugs available, Suction available and Patient being monitored Patient Re-evaluated:Patient Re-evaluated prior to inductionOxygen Delivery Method: Circle System Utilized Preoxygenation: Pre-oxygenation with 100% oxygen Intubation Type: IV induction Ventilation: Mask ventilation without difficulty Laryngoscope Size: Miller and 2 Grade View: Grade I Tube type: Oral Tube size: 8.0 mm Number of attempts: 1 Airway Equipment and Method: Stylet and Oral airway Placement Confirmation: ETT inserted through vocal cords under direct vision,  positive ETCO2 and breath sounds checked- equal and bilateral Tube secured with: Tape Dental Injury: Teeth and Oropharynx as per pre-operative assessment

## 2016-11-29 ENCOUNTER — Encounter (HOSPITAL_COMMUNITY): Payer: Self-pay | Admitting: Thoracic Surgery (Cardiothoracic Vascular Surgery)

## 2016-11-29 ENCOUNTER — Inpatient Hospital Stay (HOSPITAL_COMMUNITY): Payer: Medicare Other

## 2016-11-29 LAB — BASIC METABOLIC PANEL
ANION GAP: 8 (ref 5–15)
BUN: 16 mg/dL (ref 6–20)
CALCIUM: 8.9 mg/dL (ref 8.9–10.3)
CO2: 23 mmol/L (ref 22–32)
CREATININE: 1.48 mg/dL — AB (ref 0.44–1.00)
Chloride: 109 mmol/L (ref 101–111)
GFR, EST AFRICAN AMERICAN: 38 mL/min — AB (ref >60)
GFR, EST NON AFRICAN AMERICAN: 33 mL/min — AB (ref >60)
Glucose, Bld: 110 mg/dL — ABNORMAL HIGH (ref 65–99)
Potassium: 4.6 mmol/L (ref 3.5–5.1)
SODIUM: 140 mmol/L (ref 135–145)

## 2016-11-29 LAB — POCT I-STAT 3, ART BLOOD GAS (G3+)
Acid-base deficit: 2 mmol/L (ref 0.0–2.0)
Acid-base deficit: 3 mmol/L — ABNORMAL HIGH (ref 0.0–2.0)
BICARBONATE: 22.2 mmol/L (ref 20.0–28.0)
Bicarbonate: 23.3 mmol/L (ref 20.0–28.0)
O2 Saturation: 99 %
O2 Saturation: 98 %
PCO2 ART: 37.9 mmHg (ref 32.0–48.0)
Patient temperature: 37.7
Patient temperature: 37.5
TCO2: 24 mmol/L (ref 0–100)
TCO2: 23 mmol/L (ref 0–100)
pCO2 arterial: 41.8 mmHg (ref 32.0–48.0)
pH, Arterial: 7.356 (ref 7.350–7.450)
pH, Arterial: 7.378 (ref 7.350–7.450)
pO2, Arterial: 127 mmHg — ABNORMAL HIGH (ref 83.0–108.0)
pO2, Arterial: 103 mmHg (ref 83.0–108.0)

## 2016-11-29 LAB — GLUCOSE, CAPILLARY
GLUCOSE-CAPILLARY: 118 mg/dL — AB (ref 65–99)
GLUCOSE-CAPILLARY: 168 mg/dL — AB (ref 65–99)
Glucose-Capillary: 106 mg/dL — ABNORMAL HIGH (ref 65–99)
Glucose-Capillary: 111 mg/dL — ABNORMAL HIGH (ref 65–99)
Glucose-Capillary: 113 mg/dL — ABNORMAL HIGH (ref 65–99)
Glucose-Capillary: 114 mg/dL — ABNORMAL HIGH (ref 65–99)
Glucose-Capillary: 116 mg/dL — ABNORMAL HIGH (ref 65–99)
Glucose-Capillary: 118 mg/dL — ABNORMAL HIGH (ref 65–99)
Glucose-Capillary: 120 mg/dL — ABNORMAL HIGH (ref 65–99)
Glucose-Capillary: 120 mg/dL — ABNORMAL HIGH (ref 65–99)
Glucose-Capillary: 121 mg/dL — ABNORMAL HIGH (ref 65–99)
Glucose-Capillary: 125 mg/dL — ABNORMAL HIGH (ref 65–99)
Glucose-Capillary: 153 mg/dL — ABNORMAL HIGH (ref 65–99)
Glucose-Capillary: 155 mg/dL — ABNORMAL HIGH (ref 65–99)
Glucose-Capillary: 167 mg/dL — ABNORMAL HIGH (ref 65–99)

## 2016-11-29 LAB — CBC
HCT: 22 % — ABNORMAL LOW (ref 36.0–46.0)
HCT: 22.4 % — ABNORMAL LOW (ref 36.0–46.0)
Hemoglobin: 7.6 g/dL — ABNORMAL LOW (ref 12.0–15.0)
Hemoglobin: 7.3 g/dL — ABNORMAL LOW (ref 12.0–15.0)
MCH: 28.6 pg (ref 26.0–34.0)
MCH: 27 pg (ref 26.0–34.0)
MCHC: 34.5 g/dL (ref 30.0–36.0)
MCHC: 32.6 g/dL (ref 30.0–36.0)
MCV: 82.7 fL (ref 78.0–100.0)
MCV: 83 fL (ref 78.0–100.0)
PLATELETS: 111 10*3/uL — AB (ref 150–400)
PLATELETS: 111 10*3/uL — AB (ref 150–400)
RBC: 2.66 MIL/uL — ABNORMAL LOW (ref 3.87–5.11)
RBC: 2.7 MIL/uL — ABNORMAL LOW (ref 3.87–5.11)
RDW: 15.9 % — AB (ref 11.5–15.5)
RDW: 16.3 % — AB (ref 11.5–15.5)
WBC: 11.5 10*3/uL — AB (ref 4.0–10.5)
WBC: 14.1 10*3/uL — ABNORMAL HIGH (ref 4.0–10.5)

## 2016-11-29 LAB — CREATININE, SERUM
CREATININE: 1.68 mg/dL — AB (ref 0.44–1.00)
GFR calc Af Amer: 33 mL/min — ABNORMAL LOW (ref >60)
GFR calc non Af Amer: 28 mL/min — ABNORMAL LOW (ref >60)

## 2016-11-29 LAB — POCT I-STAT, CHEM 8
BUN: 22 mg/dL — ABNORMAL HIGH (ref 6–20)
CALCIUM ION: 1.25 mmol/L (ref 1.15–1.40)
CHLORIDE: 104 mmol/L (ref 101–111)
CREATININE: 1.7 mg/dL — AB (ref 0.44–1.00)
Glucose, Bld: 195 mg/dL — ABNORMAL HIGH (ref 65–99)
HCT: 22 % — ABNORMAL LOW (ref 36.0–46.0)
Hemoglobin: 7.5 g/dL — ABNORMAL LOW (ref 12.0–15.0)
Potassium: 4.6 mmol/L (ref 3.5–5.1)
SODIUM: 138 mmol/L (ref 135–145)
TCO2: 24 mmol/L (ref 0–100)

## 2016-11-29 LAB — MAGNESIUM
MAGNESIUM: 2.6 mg/dL — AB (ref 1.7–2.4)
Magnesium: 2.5 mg/dL — ABNORMAL HIGH (ref 1.7–2.4)

## 2016-11-29 LAB — HEMOGLOBIN A1C
HEMOGLOBIN A1C: 7.6 % — AB (ref 4.8–5.6)
Mean Plasma Glucose: 171 mg/dL

## 2016-11-29 MED ORDER — ENOXAPARIN SODIUM 40 MG/0.4ML ~~LOC~~ SOLN
40.0000 mg | Freq: Every day | SUBCUTANEOUS | Status: DC
  Administered 2016-11-29: 40 mg via SUBCUTANEOUS
  Filled 2016-11-29: qty 0.4

## 2016-11-29 MED ORDER — PREDNISONE 5 MG PO TABS
7.5000 mg | ORAL_TABLET | Freq: Every day | ORAL | Status: DC
  Administered 2016-11-29: 7.5 mg via ORAL
  Filled 2016-11-29 (×2): qty 1

## 2016-11-29 MED ORDER — LATANOPROST 0.005 % OP SOLN
1.0000 [drp] | Freq: Every day | OPHTHALMIC | Status: DC
  Administered 2016-11-29 – 2016-12-08 (×10): 1 [drp] via OPHTHALMIC
  Filled 2016-11-29: qty 2.5

## 2016-11-29 MED ORDER — DILTIAZEM HCL ER COATED BEADS 240 MG PO CP24
240.0000 mg | ORAL_CAPSULE | Freq: Every day | ORAL | Status: DC
  Filled 2016-11-29: qty 1

## 2016-11-29 MED ORDER — INSULIN DETEMIR 100 UNIT/ML ~~LOC~~ SOLN: 20.0000 [IU] | Freq: Two times a day (BID) | SUBCUTANEOUS | Status: DC

## 2016-11-29 MED ORDER — ORAL CARE MOUTH RINSE
15.0000 mL | Freq: Two times a day (BID) | OROMUCOSAL | Status: DC
  Administered 2016-11-29 – 2016-12-08 (×15): 15 mL via OROMUCOSAL

## 2016-11-29 MED ORDER — ROSUVASTATIN CALCIUM 20 MG PO TABS
20.0000 mg | ORAL_TABLET | Freq: Every day | ORAL | Status: DC
  Administered 2016-11-29: 20 mg via ORAL
  Filled 2016-11-29: qty 1

## 2016-11-29 MED ORDER — CARVEDILOL 3.125 MG PO TABS
3.1250 mg | ORAL_TABLET | Freq: Two times a day (BID) | ORAL | Status: DC
  Administered 2016-11-29 (×2): 3.125 mg via ORAL
  Filled 2016-11-29 (×2): qty 1

## 2016-11-29 MED ORDER — INSULIN DETEMIR 100 UNIT/ML ~~LOC~~ SOLN
20.0000 [IU] | Freq: Two times a day (BID) | SUBCUTANEOUS | Status: DC
  Administered 2016-11-29 (×2): 20 [IU] via SUBCUTANEOUS
  Filled 2016-11-29 (×3): qty 0.2

## 2016-11-29 MED ORDER — METHYLPREDNISOLONE SODIUM SUCC 125 MG IJ SOLR
80.0000 mg | Freq: Once | INTRAMUSCULAR | Status: AC
  Administered 2016-11-29: 80 mg via INTRAVENOUS
  Filled 2016-11-29: qty 2

## 2016-11-29 MED ORDER — INSULIN ASPART 100 UNIT/ML ~~LOC~~ SOLN
0.0000 [IU] | SUBCUTANEOUS | Status: DC
  Administered 2016-11-29 (×2): 2 [IU] via SUBCUTANEOUS
  Administered 2016-11-29 – 2016-12-01 (×2): 4 [IU] via SUBCUTANEOUS
  Administered 2016-12-01: 2 [IU] via SUBCUTANEOUS
  Administered 2016-12-01: 4 [IU] via SUBCUTANEOUS
  Administered 2016-12-02: 2 [IU] via SUBCUTANEOUS

## 2016-11-29 MED FILL — Mannitol IV Soln 20%: INTRAVENOUS | Qty: 500 | Status: AC

## 2016-11-29 MED FILL — Electrolyte-R (PH 7.4) Solution: INTRAVENOUS | Qty: 1000 | Status: AC

## 2016-11-29 MED FILL — Heparin Sodium (Porcine) Inj 1000 Unit/ML: INTRAMUSCULAR | Qty: 10 | Status: AC

## 2016-11-29 MED FILL — Sodium Bicarbonate IV Soln 8.4%: INTRAVENOUS | Qty: 50 | Status: AC

## 2016-11-29 MED FILL — Sodium Chloride IV Soln 0.9%: INTRAVENOUS | Qty: 2000 | Status: AC

## 2016-11-29 MED FILL — Lidocaine HCl IV Inj 20 MG/ML: INTRAVENOUS | Qty: 5 | Status: AC

## 2016-11-29 NOTE — Progress Notes (Signed)
Pt meets all criteria for rapid wean - ABG within parameters and mechanics satisfactory. Pt does NOT have cuff leak. RT attempted to manipulate tube with cuff completely deflated, pt still without cuff leak.  Dr. Prescott Gum paged and notified, verbal orders received to give 80 mg Solu-Medrol and extubate 30 minutes after med given. Will enact orders and continue to monitor closely.  Sherlie Ban, RN

## 2016-11-29 NOTE — Progress Notes (Signed)
1 Day Post-Op Procedure(s) (LRB): CORONARY ARTERY BYPASS GRAFTING (CABG) x 4 (N/A) MAZE (N/A) TRANSESOPHAGEAL ECHOCARDIOGRAM (TEE) (N/A) ENDOVEIN HARVEST OF GREATER SAPHENOUS VEIN (Right) Subjective: Was slow to wake up then no cuff leak, only extubated early this AM No complaints  Objective: Vital signs in last 24 hours: Temp:  [95.7 F (35.4 C)-100 F (37.8 C)] 99.5 F (37.5 C) (01/12 0700) Pulse Rate:  [78-90] 90 (01/12 0700) Cardiac Rhythm: A-V Sequential paced (01/12 0400) Resp:  [10-27] 22 (01/12 0700) BP: (88-135)/(56-80) 114/61 (01/12 0700) SpO2:  [96 %-100 %] 99 % (01/12 0700) Arterial Line BP: (100-151)/(47-85) 107/47 (01/12 0700) FiO2 (%):  [40 %-50 %] 40 % (01/12 0430) Weight:  [163 lb 2.3 oz (74 kg)] 163 lb 2.3 oz (74 kg) (01/12 0600)  Hemodynamic parameters for last 24 hours: PAP: (16-36)/(8-22) 23/8 CO:  [2.2 L/min-4.1 L/min] 4.1 L/min CI:  [1.3 L/min/m2-2.3 L/min/m2] 2.3 L/min/m2  Intake/Output from previous day: 01/11 0701 - 01/12 0700 In: 6339.4 [I.V.:4639.4; Blood:250; NG/GT:50; IV Piggyback:1400] Out: [id number] [Urine:5080; Emesis/NG output:75; Blood:600; Chest Tube:400] Intake/Output this shift: No intake/output data recorded.  General appearance: alert, cooperative and no distress Neurologic: intact Heart: regular rate and rhythm Lungs: diminished breath sounds bibasilar Abdomen: normal findings: soft, non-tender  Lab Results:  Recent Labs  11/28/16 2000 11/28/16 2009 11/29/16 0400  WBC 11.2*  --  11.5*  HGB 7.5* 7.1* 7.6*  HCT 22.7* 21.0* 22.0*  PLT 102*  --  111*   BMET:  Recent Labs  11/27/16 0244  11/28/16 2009 11/29/16 0400  NA 136  < > 139 140  K 4.6  < > 4.8 4.6  CL 104  < > 107 109  CO2 23  --   --  23  GLUCOSE 197*  < > 115* 110*  BUN 28*  < > 18 16  CREATININE 1.67*  < > 1.20* 1.48*  CALCIUM 9.1  --   --  8.9  < > = values in this interval not displayed.  PT/INR:  Recent Labs  11/28/16 1410  LABPROT 18.4*  INR 1.51    ABG    Component Value Date/Time   PHART 7.378 11/29/2016 0707   HCO3 22.2 11/29/2016 0707   TCO2 23 11/29/2016 0707   ACIDBASEDEF 3.0 (H) 11/29/2016 0707   O2SAT 98.0 11/29/2016 0707   CBG (last 3)   Recent Labs  11/29/16 0500 11/29/16 0559 11/29/16 0704  GLUCAP 116* 120* 120*    Assessment/Plan: S/P Procedure(s) (LRB): CORONARY ARTERY BYPASS GRAFTING (CABG) x 4 (N/A) MAZE (N/A) TRANSESOPHAGEAL ECHOCARDIOGRAM (TEE) (N/A) ENDOVEIN HARVEST OF GREATER SAPHENOUS VEIN (Right) -POD # 1  CV- in SR, hx atrial fib will resume coumadin  Good hemodynamics- dc swan and A line  RESP_ IS  RENAL- creatinine below baseline of 1.8  IV lasix this AM  ENDO_ CBG well controlled- transition to levemir + SSI  SCD + enoxaparin for DVT prophylaxis  Dc chest tubes  Mobilize    LOS: 4 days    Melrose Nakayama 11/29/2016

## 2016-11-29 NOTE — Progress Notes (Signed)
Patient ID: Audrey Mullen, female   DOB: 1941/01/05, 76 y.o.   MRN: [id number]   SICU Evening Rounds:   Hemodynamically stable   Urine output adequate    CBC    Component Value Date/Time   WBC 14.1 (H) 11/29/2016 1750   RBC 2.70 (L) 11/29/2016 1750   HGB 7.3 (L) 11/29/2016 1750   HCT 22.4 (L) 11/29/2016 1750   HCT 33.8 (L) 11/21/2016 0913   PLT 111 (L) 11/29/2016 1750   PLT 181 11/21/2016 0913   MCV 83.0 11/29/2016 1750   MCV 84 11/21/2016 0913   MCH 27.0 11/29/2016 1750   MCHC 32.6 11/29/2016 1750   RDW 16.3 (H) 11/29/2016 1750   RDW 16.8 (H) 11/21/2016 0913   LYMPHSABS 1.4 11/21/2016 0913   MONOABS 404 11/07/2016 1527   EOSABS 0.0 11/21/2016 0913   BASOSABS 0.0 11/21/2016 0913     BMET    Component Value Date/Time   NA 138 11/29/2016 1730   NA 141 11/21/2016 0913   K 4.6 11/29/2016 1730   CL 104 11/29/2016 1730   CO2 23 11/29/2016 0400   GLUCOSE 195 (H) 11/29/2016 1730   BUN 22 (H) 11/29/2016 1730   BUN 25 11/21/2016 0913   CREATININE 1.70 (H) 11/29/2016 1730   CREATININE 1.30 (H) 05/03/2016 1011   CALCIUM 8.9 11/29/2016 0400   GFRNONAA 33 (L) 11/29/2016 0400   GFRAA 38 (L) 11/29/2016 0400     A/P:  Stable postop course. Continue current plans

## 2016-11-29 NOTE — Progress Notes (Signed)
Pt positive for cuff leak when OG tube pulled.  Sherlie Ban, RN

## 2016-11-29 NOTE — Procedures (Signed)
Extubation Procedure Note Multiple attempts to wean patient without success throughout night. Pt awake while in room and breathing until RT left room and apnea alarm would activate Wean attempted on: 11/28/16 @ 2009, 2138, 2336 & 11/29/16 @ 0217 Pt completed Rapid wean @ 0420, ABG normal, NIF -36, VC 861mls. Pt had no cuff leak, Cuff deflated, Vent mode switched to VC and ETT moved side to side without leak present. @ 0430 Dr. Prescott Gum paged. Solu-Medrol ordered and extubation 30 minutes later. Pt successfully extubated @ 0542.  Extubated to 2L [place] (SpO2 100%) Clearly speaks name/location. Strong cough. No stridor. RT will monitor  Patient Details:   Name: Audrey Mullen DOB: December 19, 1940 MRN: [id number]   Airway Documentation:     Evaluation  O2 sats: stable throughout Complications: No apparent complications Patient did tolerate procedure well. Bilateral Breath Sounds: Clear   Yes  Sharen Hint 11/29/2016, 5:53 AM

## 2016-11-29 NOTE — Progress Notes (Signed)
Pt vent settings flipped to CPAP/PS @ 0400. Pt tolerating well at this time. Will check ABG @ 0420.  Sherlie Ban, RN

## 2016-11-29 NOTE — Op Note (Signed)
NAME:  Audrey Mullen, Audrey Mullen NO.:  [id number]  MEDICAL RECORD NO.:  [id number]  LOCATION:  2S13C                        FACILITY:  [facility]  PHYSICIAN:  Revonda Standard. 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  PROCEDURE:  Median sternotomy, extracorporeal circulation, Coronary artery bypass grafting x4  Left internal mammary artery to left anterior Descending,  Saphenous vein graft to second diagonal,  Saphenous vein graft to first obtuse marginal,  Saphenous vein graft to posterior descending, Endoscopic vein harvest right leg, Left-sided maze procedure.  SURGEON:  Revonda Standard. Roxan Hockey, M.D.  ASSISTANT:  Nicholes Rough, PA.  ANESTHESIA:  General.  FINDINGS:  Transesophageal echocardiography results revealed preserved left ventricular function with no significant valvular pathology. Coronary arteries good quality.  Mammary and vein good quality. Mammary small but with good flow.  CLINICAL NOTE:  Audrey Mullen is a 76 year old woman with known coronary artery disease and paroxysmal atrial fibrillation.  She has multiple other cardiac risk factors.  She has been experiencing exertional chest tightness, relieved with rest.  She recently had a stress test, which showed reversible defect in the basal inferior wall. At cardiac catheterization, she was found to have left main and 3-vessel coronary artery disease.  She was advised to have coronary artery bypass grafting and also offered the option of left-sided maze procedure.  The indications, risks, benefits, and alternatives were discussed in detail with the patient.  She understood and accepted the risks  and agreed to proceed.  She did understand the elective nature of the maze procedure and the approximately 85% success rate of that procedure.  OPERATIVE NOTE:  Audrey Mullen was brought to the preoperative holding area on November 28, 2016.  Anesthesia placed a Swan-Ganz catheter and arterial blood pressure monitoring line.  She was taken to the operating room, anesthetized, and intubated.  Dr. Tedra Senegal of anesthesia performed transesophageal echocardiography.  There was preserved left ventricular function with no valvular pathology.  Intravenous antibiotics were administered.  A Foley catheter was placed.  The chest, abdomen, and legs were prepped and draped in usual sterile fashion.  A median sternotomy was performed and the left internal mammary artery was harvested using standard technique.  The mammary artery was relatively small in caliber, but did accept a 1.5 mm probe once divided. It had excellent flow.  Simultaneously with the mammary harvest, an incision was made in the medial aspect of the right leg just below the knee.  The greater saphenous vein was harvested from mid calf to groin endoscopically.  The saphenous vein was of good quality.  2000 units of heparin was administered during the vessel harvest. The remainder of the full heparin dose was given prior to opening the pericardium.  After the conduits had been harvested, the remainder of full heparin dose was given.  The pericardium was opened.  The ascending aorta was Inspected. After confirming adequate anticoagulation with ACT measurement.  The aorta was cannulated via concentric 2-0  Ethibond pledgeted pursestring sutures.  A 31-French right-angle venous cannula was placed via a pursestring suture in the superior vena cava. Cardiopulmonary bypass was initiated.  A 36-French malleable venous cannula was placed via a separate pursestring suture in the inferior aspect of the right atrium and directed into the  inferior vena cava. Flows were maintained per protocol and the patient was cooled to 32 degrees Celsius.  The coronary arteries were inspected and anastomotic sites were chosen.  The conduits were inspected and cut to length.  A retrograde cardioplegia cannula was placed via a pursestring suture in the right atrium and directed into the coronary sinus.  A foam pad was placed to the pericardium to insulate the heart.  A temperature probe was placed in myocardial septum and a cardioplegia cannula was placed in the ascending aorta.  The aorta was crossclamped.  The left ventricle was emptied via the aortic root vent.  Cardiac arrest then was achieved with a combination of cold antegrade and retrograde blood cardioplegia and topical iced saline.  An initial 500 mL of cardioplegia was administered antegrade. There was a rapid diastolic arrest.  An additional 500 mL was given retrograde and there was septal cooling to 8 degrees Celsius.  Prior to cross-clamping radiofrequency ablation was performed using the AtriCure device on the left atrium just proximal to the confluence of the left- sided pulmonary veins, three parallel lines were made achieving tranmurality with all.  The base of the left atrial appendage also was ablated in a similar fashion.  After achieving diastolic arrest and adequate cooling, a left atriotomy was performed.  The ablation line around the right-sided pulmonary veins was completed, again just on the left atrial side of the confluence of the veins with the bipolar radiofrequency device again with 3 parallel lines.  Next, the pulmonary vein isolation was completed with box lesions including a connecting lesion to the base of the left atrial appendage. This was done with the cryoprobe taking it to -70 degrees Celsius for 2 minutes and finally the line was completed from the pulmonary veins to the 4 o'clock position on the mitral anulus again using the cryoprobe. The  left atriotomy then was closed with two layers of running 4-0 Prolene suture, the first was a running horizontal mattress suture.  Tennis Must- airing was performed.  The second layer was a running 4-0 Prolene suture.  It should be noted that carbon dioxide was insufflated into the operative field during the bypass period.  A reversed saphenous vein graft was placed end-to-side to the posterior descending branch of the right coronary.  This was a 1.5 mm vessel.  All of the vessels were very tortuous.  It was a good quality target.  The vein was anastomosed end-to-side with a running 7-0 Prolene suture.  All anastomoses were probed proximally and distally at their completion to ensure patency.  Cardioplegia was administered down to each vein graft to assess flow and hemostasis and both were good.  Next, reversed saphenous vein graft was placed end-to-side to the first obtuse marginal branch of the left circumflex.  The second obtuse marginal was too small to graft.  The first obtuse marginal was 1.5 mm good quality target.  An end-to-side anastomosis was performed with a running 7-0 Prolene suture.  Again, flow and hemostasis were good.  Additional cardioplegia was administered via the retrograde cannula.  Next, a reversed saphenous vein graft was placed end-to-side to the second diagonal branch of the LAD.  This was 1.5 mm  good quality target. A 7-0 Prolene was used for the anastomosis.  There was good flow with cardioplegia administration and good hemostasis.  The left internal mammary artery was brought through a window in the pericardium.  The distal end was beveled.  It did accept a 1.5 mm probe and had good flow.  It was anastomosed end-to-side to the distal LAD. The LAD had some diffuse plaque distal to the anastomosis, but a 1.5 mm probe did pass to the apex.  Proximal to the anastomosis, the vessel was very tortuous.  The anastomosis was performed with a running 8-0 Prolene suture in  an end-to-side fashion.  At the completion of the anastomosis, the bulldog clamp was briefly removed to inspect for hemostasis.  There was rapid septal rewarming.  The bulldog clamp was replaced.  The mammary pedicle was tacked to the epicardial surface of the heart with 6- 0 Prolene sutures.  Rewarming was begun.  The cardioplegia cannula was removed from the ascending aorta.  The proximal vein graft anastomoses were performed to 4.5 mm punch aortotomies with running 6-0 Prolene sutures.  At the completion of the final proximal anastomosis, the patient was placed in Trendelenburg position.  Lidocaine was administered.  The aortic root was de-aired and the aortic crossclamp was removed.  Total crossclamp time was 106 minutes.  The patient required 2 defibrillations with 10 joules and then was in sinus rhythm thereafter.  While rewarming was completed, all proximal and distal anastomoses were inspected for hemostasis.  The left atriotomy was inspected for hemostasis.  The superior vena caval cannula was redirected into the right atrium and inferior vena cava cannula was removed.  Epicardial pacing wires were placed on the right ventricle and right atrium.  DDD pacing was initiated.  When the patient had rewarmed to a core temperature of 37 degrees Celsius, she was weaned from cardiopulmonary bypass on the first attempt.  She did not require inotropic support.  Total bypass time was 147 minutes.  The initial cardiac index was greater than 2 L/min/m2. Post bypass transesophageal echocardiography was essentially unchanged from the prebypass study.  A test dose of protamine was administered and was well tolerated.  The atrial and aortic cannulae were removed.  The remainder of the protamine was administered without incident.  The chest was irrigated with warm saline.  Hemostasis was achieved.  Left pleural and mediastinal chest tubes were placed through separate subcostal incisions.  The  pericardium was reapproximated over the ascending aorta with interrupted 3-0 silk sutures.  The sternum was closed with a combination of single and double heavy gauge stainless steel wires.  The pectoralis fascia, subcutaneous tissue, and skin were closed in standard fashion.  All sponge, needle, and instrument counts were correct at the end of the procedure.  The patient was taken from the operating room to the Surgical Care Unit, intubated and in good condition.     Revonda Standard Roxan Hockey, M.D.     SCH/MEDQ  D:  11/28/2016  T:  11/29/2016  Job:  [id number]

## 2016-11-29 NOTE — Progress Notes (Signed)
Pt continues to not breathe over vent. Pt intermittently will wake up but falls back asleep almost immediately. Unable to wean at this time. Will continue to monitor.  Sherlie Ban, RN 2:17 AM

## 2016-11-29 NOTE — Progress Notes (Signed)
RT NOTE:  Rapid wean initiated.

## 2016-11-30 ENCOUNTER — Inpatient Hospital Stay (HOSPITAL_COMMUNITY): Payer: Medicare Other

## 2016-11-30 DIAGNOSIS — I6340 Cerebral infarction due to embolism of unspecified cerebral artery: Secondary | ICD-10-CM | POA: Insufficient documentation

## 2016-11-30 DIAGNOSIS — I63412 Cerebral infarction due to embolism of left middle cerebral artery: Secondary | ICD-10-CM

## 2016-11-30 DIAGNOSIS — I2583 Coronary atherosclerosis due to lipid rich plaque: Secondary | ICD-10-CM

## 2016-11-30 DIAGNOSIS — E785 Hyperlipidemia, unspecified: Secondary | ICD-10-CM

## 2016-11-30 DIAGNOSIS — E1159 Type 2 diabetes mellitus with other circulatory complications: Secondary | ICD-10-CM

## 2016-11-30 DIAGNOSIS — I63512 Cerebral infarction due to unspecified occlusion or stenosis of left middle cerebral artery: Secondary | ICD-10-CM

## 2016-11-30 DIAGNOSIS — I480 Paroxysmal atrial fibrillation: Secondary | ICD-10-CM

## 2016-11-30 DIAGNOSIS — I2510 Atherosclerotic heart disease of native coronary artery without angina pectoris: Secondary | ICD-10-CM

## 2016-11-30 LAB — GLUCOSE, CAPILLARY
GLUCOSE-CAPILLARY: 125 mg/dL — AB (ref 65–99)
GLUCOSE-CAPILLARY: 68 mg/dL (ref 65–99)
GLUCOSE-CAPILLARY: 87 mg/dL (ref 65–99)
GLUCOSE-CAPILLARY: 98 mg/dL (ref 65–99)
Glucose-Capillary: 108 mg/dL — ABNORMAL HIGH (ref 65–99)
Glucose-Capillary: 112 mg/dL — ABNORMAL HIGH (ref 65–99)
Glucose-Capillary: 62 mg/dL — ABNORMAL LOW (ref 65–99)
Glucose-Capillary: 66 mg/dL (ref 65–99)
Glucose-Capillary: 82 mg/dL (ref 65–99)

## 2016-11-30 LAB — CBC
HCT: 20.5 % — ABNORMAL LOW (ref 36.0–46.0)
Hemoglobin: 6.6 g/dL — CL (ref 12.0–15.0)
MCH: 26.6 pg (ref 26.0–34.0)
MCHC: 32.2 g/dL (ref 30.0–36.0)
MCV: 82.7 fL (ref 78.0–100.0)
Platelets: 124 10*3/uL — ABNORMAL LOW (ref 150–400)
RBC: 2.48 MIL/uL — AB (ref 3.87–5.11)
RDW: 16.5 % — ABNORMAL HIGH (ref 11.5–15.5)
WBC: 17.2 10*3/uL — AB (ref 4.0–10.5)

## 2016-11-30 LAB — BASIC METABOLIC PANEL
Anion gap: 6 (ref 5–15)
BUN: 25 mg/dL — AB (ref 6–20)
CHLORIDE: 108 mmol/L (ref 101–111)
CO2: 24 mmol/L (ref 22–32)
CREATININE: 1.63 mg/dL — AB (ref 0.44–1.00)
Calcium: 8.8 mg/dL — ABNORMAL LOW (ref 8.9–10.3)
GFR, EST AFRICAN AMERICAN: 34 mL/min — AB (ref >60)
GFR, EST NON AFRICAN AMERICAN: 29 mL/min — AB (ref >60)
Glucose, Bld: 85 mg/dL (ref 65–99)
POTASSIUM: 4.3 mmol/L (ref 3.5–5.1)
SODIUM: 138 mmol/L (ref 135–145)

## 2016-11-30 LAB — PROTIME-INR
INR: 1.23
Prothrombin Time: 15.5 seconds — ABNORMAL HIGH (ref 11.4–15.2)

## 2016-11-30 LAB — PREPARE RBC (CROSSMATCH)

## 2016-11-30 MED ORDER — IOPAMIDOL (ISOVUE-370) INJECTION 76%
INTRAVENOUS | Status: AC
  Administered 2016-11-30: 02:00:00
  Filled 2016-11-30: qty 50

## 2016-11-30 MED ORDER — RESOURCE THICKENUP CLEAR PO POWD
ORAL | Status: DC | PRN
  Filled 2016-11-30 (×2): qty 125

## 2016-11-30 MED ORDER — HYDROCORTISONE NA SUCCINATE PF 100 MG IJ SOLR
30.0000 mg | Freq: Every day | INTRAMUSCULAR | Status: DC
  Administered 2016-11-30 – 2016-12-01 (×2): 30 mg via INTRAVENOUS
  Filled 2016-11-30 (×2): qty 2

## 2016-11-30 MED ORDER — FAMOTIDINE IN NACL 20-0.9 MG/50ML-% IV SOLN
20.0000 mg | Freq: Two times a day (BID) | INTRAVENOUS | Status: DC
  Administered 2016-11-30: 20 mg via INTRAVENOUS
  Filled 2016-11-30: qty 50

## 2016-11-30 MED ORDER — FOOD THICKENER (THICKENUP CLEAR): ORAL | Status: DC | PRN

## 2016-11-30 MED ORDER — FAMOTIDINE IN NACL 20-0.9 MG/50ML-% IV SOLN
20.0000 mg | INTRAVENOUS | Status: DC
  Administered 2016-12-01 – 2016-12-02 (×2): 20 mg via INTRAVENOUS
  Filled 2016-11-30 (×2): qty 50

## 2016-11-30 MED ORDER — ENOXAPARIN SODIUM 30 MG/0.3ML ~~LOC~~ SOLN
30.0000 mg | Freq: Every day | SUBCUTANEOUS | Status: DC
  Administered 2016-11-30 – 2016-12-02 (×3): 30 mg via SUBCUTANEOUS
  Filled 2016-11-30 (×3): qty 0.3

## 2016-11-30 MED ORDER — SODIUM CHLORIDE 0.9 % IV SOLN
Freq: Once | INTRAVENOUS | Status: AC
  Administered 2016-11-30: 10:00:00 via INTRAVENOUS

## 2016-11-30 MED ORDER — DEXTROSE 50 % IV SOLN
25.0000 mL | Freq: Once | INTRAVENOUS | Status: AC
  Administered 2016-11-30: 25 mL via INTRAVENOUS
  Filled 2016-11-30: qty 50

## 2016-11-30 MED ORDER — STARCH (THICKENING) PO POWD
ORAL | Status: DC | PRN
  Filled 2016-11-30: qty 227

## 2016-11-30 MED ORDER — ACETAMINOPHEN 10 MG/ML IV SOLN: 1000.0000 mg | Freq: Four times a day (QID) | INTRAVENOUS | Status: DC | PRN

## 2016-11-30 MED ORDER — ASPIRIN 300 MG RE SUPP
300.0000 mg | Freq: Every day | RECTAL | Status: DC
  Administered 2016-11-30 – 2016-12-01 (×2): 300 mg via RECTAL
  Filled 2016-11-30 (×2): qty 1

## 2016-11-30 MED ORDER — SODIUM CHLORIDE 0.9 % IV SOLN
250.0000 mL | INTRAVENOUS | Status: DC
  Administered 2016-11-30 – 2016-12-01 (×2): 250 mL via INTRAVENOUS
  Administered 2016-12-02: 1000 mL via INTRAVENOUS

## 2016-11-30 NOTE — Progress Notes (Signed)
Hypoglycemic Event  CBG: 66  Treatment: D50 IV 25 mL  Symptoms: None  Follow-up CBG: [id number] CBG Result:125  Possible Reasons for Event: Inadequate meal intake  Comments/MD notified:    Jsaon Yoo B

## 2016-11-30 NOTE — Evaluation (Signed)
Clinical/Bedside Swallow Evaluation Patient Details  Name: Audrey Mullen MRN: [id number] Date of Birth: 02-10-1941  Today's Date: 11/30/2016 Time: SLP Start Time (ACUTE ONLY): 1110 SLP Stop Time (ACUTE ONLY): 1130 SLP Time Calculation (min) (ACUTE ONLY): 20 min  Past Medical History:  Past Medical History:  Diagnosis Date  . Anginal pain ([place])   . Arthritis   . Coronary artery disease   . Diabetes mellitus without complication ([place])   . Dysrhythmia    atrail fibrillation  . GERD (gastroesophageal reflux disease)   . Glaucoma    bilat   . Hyperlipidemia   . Hypertension   . Kidney stone   . Urinary tract infection   . Wears glasses    Past Surgical History:  Past Surgical History:  Procedure Laterality Date  . CARDIAC CATHETERIZATION N/A 11/25/2016   Procedure: Left Heart Cath and Coronary Angiography;  Surgeon: Lorretta Harp, MD;  Location: [facility];  Service: Cardiovascular;  Laterality: N/A;  . CORONARY ARTERY BYPASS GRAFT N/A 11/28/2016   Procedure: CORONARY ARTERY BYPASS GRAFTING (CABG) x 4;  Surgeon: Melrose Nakayama, MD;  Location: [facility];  Service: Open Heart Surgery;  Laterality: N/A;  . CYSTOSCOPY WITH RETROGRADE PYELOGRAM, URETEROSCOPY AND STENT PLACEMENT Left 06/03/2016   Procedure: CYSTOSCOPY WITH LEFT RETROGRADE PYELOGRAM, URETEROSCOPY, REMOVAL OF LEFT NEPHROSTOMY TUBE, INSERTION OF DOUBLE J STENT, LEFT;  Surgeon: Carolan Clines, MD;  Location: [facility];  Service: Urology;  Laterality: Left;  . ENDOVEIN HARVEST OF GREATER SAPHENOUS VEIN Right 11/28/2016   Procedure: ENDOVEIN HARVEST OF GREATER SAPHENOUS VEIN;  Surgeon: Melrose Nakayama, MD;  Location: [facility];  Service: Open Heart Surgery;  Laterality: Right;  . EYE SURGERY     cataract removed per left eye   . HOLMIUM LASER APPLICATION Left [id number]   Procedure: HOLMIUM LASER OF STONE ;  Surgeon: Carolan Clines, MD;  Location: [facility];  Service: Urology;  Laterality: Left;  Marland Kitchen MAZE N/A  11/28/2016   Procedure: MAZE;  Surgeon: Melrose Nakayama, MD;  Location: [facility];  Service: Open Heart Surgery;  Laterality: N/A;  . ROTATOR CUFF REPAIR    . TEE WITHOUT CARDIOVERSION N/A 11/28/2016   Procedure: TRANSESOPHAGEAL ECHOCARDIOGRAM (TEE);  Surgeon: Melrose Nakayama, MD;  Location: [facility];  Service: Open Heart Surgery;  Laterality: N/A;   HPI:  Pt is a 76 y.o. female with PMH of HTN, HLD, DM, nephrolithiasis, and PAF who presented for exertional chest tightness with SOB. Pt had CABG surgery on 1/11. Pt was intubated for procedure, extubated 1/12. On 1/13 pt had sudden onset RUE weakness, R facial droop and aphasia. 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 CXR showed stable dense LLL atelectasis, PNA or aspiration pneumonitis. Bedside swallow eval ordered.   Assessment / Plan / Recommendation Clinical Impression  Pt showed immediate overt s/s of aspiration following trials of thin liquids (cough). Following trial of solid a thin liquid was provided in attempt to clear oral residuals which resulted in immediate prolonged cough. Pt with R side weakness and suspect some decreased sensation. Also increasing aspiration risk is that pt was recently intubated for procedure, and pt presents with weak cough/ vocal quality. Pt was able to follow commands but with expressive aphasia and unable to produce any verbalizations during this evaluation. Recommend proceeding with a MBS to objectively evaluate swallow function. Until then, pt should remain NPO.     Aspiration Risk  Severe  aspiration risk    Diet Recommendation NPO   Medication Administration: Via alternative means    Other  Recommendations Oral Care Recommendations: Oral care QID Other Recommendations: Have oral suction available   Follow up Recommendations Other (comment) (TBD)      Frequency and Duration  (TBD)          Prognosis        Swallow Study   General  HPI: Pt is a 76 y.o. female with PMH of HTN, HLD, DM, nephrolithiasis, and PAF who presented for exertional chest tightness with SOB. Pt had CABG surgery on 1/11. Pt was intubated for procedure, extubated 1/12. On 1/13 pt had sudden onset RUE weakness, R facial droop and aphasia. 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 CXR showed stable dense LLL atelectasis, PNA or aspiration pneumonitis. Bedside swallow eval ordered. Type of Study: Bedside Swallow Evaluation Diet Prior to this Study: NPO Temperature Spikes Noted: No Respiratory Status: Nasal cannula History of Recent Intubation: Yes Length of Intubations (days): 2 days Date extubated: 11/29/16 Behavior/Cognition: Alert;Cooperative;Pleasant mood;Requires cueing Oral Cavity Assessment: Within Functional Limits Oral Cavity - Dentition: Missing dentition Self-Feeding Abilities: Needs assist Patient Positioning: Upright in bed Baseline Vocal Quality: Low vocal intensity Volitional Cough: Weak    Oral/Motor/Sensory Function Overall Oral Motor/Sensory Function: Moderate impairment Facial ROM: Reduced right;Suspected CN VII (facial) dysfunction Facial Symmetry: Abnormal symmetry right;Suspected CN VII (facial) dysfunction Facial Strength: Reduced right;Suspected CN VII (facial) dysfunction Lingual ROM: Within Functional Limits Lingual Symmetry: Abnormal symmetry right;Suspected CN XII (hypoglossal) dysfunction   Ice Chips Ice chips: Within functional limits Presentation: Spoon   Thin Liquid Thin Liquid: Impaired Presentation: Cup;Straw Oral Phase Impairments: Reduced labial seal Oral Phase Functional Implications: Right anterior spillage Pharyngeal  Phase Impairments: Cough - Immediate    Nectar Thick Nectar Thick Liquid: Not tested   Honey Thick Honey Thick Liquid: Not tested   Puree Puree: Within functional limits Presentation: Spoon   Solid   GO   Solid: Impaired Oral  Phase Functional Implications: Oral residue        Kern Reap, MA, CCC-SLP 11/30/2016,11:42 AM [phone]

## 2016-11-30 NOTE — Progress Notes (Signed)
Patient ID: Audrey Mullen, female   DOB: 08-20-1941, 76 y.o.   MRN: [id number]  SICU Evening Rounds:   Hemodynamically stable  Transfused 2 units PRBC's today  More conversant over the course of the day  Speech therapy evaluation done and recommended dysphagia 2 diet. Urine output ok   CBC    Component Value Date/Time   WBC 17.2 (H) 11/30/2016 0456   RBC 2.48 (L) 11/30/2016 0456   HGB 6.6 (LL) 11/30/2016 0456   HCT 20.5 (L) 11/30/2016 0456   HCT 33.8 (L) 11/21/2016 0913   PLT 124 (L) 11/30/2016 0456   PLT 181 11/21/2016 0913   MCV 82.7 11/30/2016 0456   MCV 84 11/21/2016 0913   MCH 26.6 11/30/2016 0456   MCHC 32.2 11/30/2016 0456   RDW 16.5 (H) 11/30/2016 0456   RDW 16.8 (H) 11/21/2016 0913   LYMPHSABS 1.4 11/21/2016 0913   MONOABS 404 11/07/2016 1527   EOSABS 0.0 11/21/2016 0913   BASOSABS 0.0 11/21/2016 0913     BMET    Component Value Date/Time   NA 138 11/30/2016 0456   NA 141 11/21/2016 0913   K 4.3 11/30/2016 0456   CL 108 11/30/2016 0456   CO2 24 11/30/2016 0456   GLUCOSE 85 11/30/2016 0456   BUN 25 (H) 11/30/2016 0456   BUN 25 11/21/2016 0913   CREATININE 1.63 (H) 11/30/2016 0456   CREATININE 1.30 (H) 05/03/2016 1011   CALCIUM 8.8 (L) 11/30/2016 0456   GFRNONAA 29 (L) 11/30/2016 0456   GFRAA 34 (L) 11/30/2016 0456     A/P:  Stable postop course. Continue current plans

## 2016-11-30 NOTE — Consult Note (Addendum)
NEURO HOSPITALIST CONSULT NOTE   Requestig physician: Dr. Roxan Hockey  Reason for Consult: Sudden onset of RUE weakness, right facial droop and aphasia  History obtained from:  Nursing Staff and Chart    HPI:                                                                                                                                          Audrey Mullen is an 76 y.o. female who presented on 1/11 for CABG x 4. Over the past couple of months she had been having SOB and exertional chest tightness that was relieved with rest. A stress test showed a reversible defect in the basal inferior wall and cardiac catheterization showed left main and 3 vessel CAD. The surgery was performed on [id number] without complications.   Today she was without neurological deficits. LKN at 11:30 PM on 1/12. At 12:55 nursing check revealed right upper extremity weakness, right facial droop and dense expressive aphasia.  A Code Stroke was called.   Past Medical History:  Diagnosis Date  . Anginal pain ([place])   . Arthritis   . Coronary artery disease   . Diabetes mellitus without complication ([place])   . Dysrhythmia    atrail fibrillation  . GERD (gastroesophageal reflux disease)   . Glaucoma    bilat   . Hyperlipidemia   . Hypertension   . Kidney stone   . Urinary tract infection   . Wears glasses     Past Surgical History:  Procedure Laterality Date  . CARDIAC CATHETERIZATION N/A 11/25/2016   Procedure: Left Heart Cath and Coronary Angiography;  Surgeon: Lorretta Harp, MD;  Location: [facility];  Service: Cardiovascular;  Laterality: N/A;  . CORONARY ARTERY BYPASS GRAFT N/A 11/28/2016   Procedure: CORONARY ARTERY BYPASS GRAFTING (CABG) x 4;  Surgeon: Melrose Nakayama, MD;  Location: [facility];  Service: Open Heart Surgery;  Laterality: N/A;  . CYSTOSCOPY WITH RETROGRADE PYELOGRAM, URETEROSCOPY AND STENT PLACEMENT Left 06/03/2016   Procedure: CYSTOSCOPY WITH LEFT  RETROGRADE PYELOGRAM, URETEROSCOPY, REMOVAL OF LEFT NEPHROSTOMY TUBE, INSERTION OF DOUBLE J STENT, LEFT;  Surgeon: Carolan Clines, MD;  Location: [facility];  Service: Urology;  Laterality: Left;  . ENDOVEIN HARVEST OF GREATER SAPHENOUS VEIN Right 11/28/2016   Procedure: ENDOVEIN HARVEST OF GREATER SAPHENOUS VEIN;  Surgeon: Melrose Nakayama, MD;  Location: [facility];  Service: Open Heart Surgery;  Laterality: Right;  . EYE SURGERY     cataract removed per left eye   . HOLMIUM LASER APPLICATION Left [id number]   Procedure: HOLMIUM LASER OF STONE ;  Surgeon: Carolan Clines, MD;  Location: [facility];  Service: Urology;  Laterality: Left;  Marland Kitchen MAZE N/A 11/28/2016   Procedure: MAZE;  Surgeon: Melrose Nakayama, MD;  Location:  [facility];  Service: Open Heart Surgery;  Laterality: N/A;  . ROTATOR CUFF REPAIR    . TEE WITHOUT CARDIOVERSION N/A 11/28/2016   Procedure: TRANSESOPHAGEAL ECHOCARDIOGRAM (TEE);  Surgeon: Melrose Nakayama, MD;  Location: [facility];  Service: Open Heart Surgery;  Laterality: N/A;    Family History  Problem Relation Age of Onset  . CAD Neg Hx    Social History:  reports that she has never smoked. She has never used smokeless tobacco. She reports that she does not drink alcohol or use drugs.  No Known Allergies  MEDICATIONS:                                                                                                                     Scheduled: . acetaminophen  1,000 mg Oral Q6H   Or  . acetaminophen (TYLENOL) oral liquid 160 mg/5 mL  1,000 mg Per Tube Q6H  . aspirin EC  325 mg Oral Daily   Or  . aspirin  324 mg Per Tube Daily  . bisacodyl  10 mg Oral Daily   Or  . bisacodyl  10 mg Rectal Daily  . carvedilol  3.125 mg Oral BID WC  . cefUROXime (ZINACEF)  IV  1.5 g Intravenous Q12H  . diltiazem  240 mg Oral Daily  . docusate sodium  200 mg Oral Daily  . enoxaparin (LOVENOX) injection  40 mg Subcutaneous QHS  . insulin aspart  0-24 Units Subcutaneous Q4H  . insulin  detemir  20 Units Subcutaneous BID  . insulin regular  0-10 Units Intravenous TID WC  . iopamidol      . latanoprost  1 drop Both Eyes QHS  . mouth rinse  15 mL Mouth Rinse BID  . pantoprazole  40 mg Oral Daily  . potassium chloride (KCL MULTIRUN) 30 mEq in 265 mL IVPB  30 mEq Intravenous Once  . predniSONE  7.5 mg Oral Q breakfast  . rosuvastatin  20 mg Oral q1800  . sodium chloride flush  3 mL Intravenous Q12H     ROS:                                                                                                                                       Unable to obtain ROS due to aphasia.   Blood pressure 132/78, pulse 88, temperature 97.9 F (36.6 C), temperature source Oral, resp. rate (!) 21,  height 5\' 4"  (1.626 m), weight 74 kg (163 lb 2.3 oz), SpO2 93 %.  General Examination:                                                                                                      HEENT-  Normocephalic/atraumatic.    Lungs- No gross wheezing. Respirations unlabored.  Extremities- Warm and well-perfused  Neurological Examination Mental Status: Awake. Dense expressive aphasia. Able to follow motor commands.  Cranial Nerves: II: Visual fields grossly normal to bedside confrontation without extinction. PERRL   III,IV, VI: ptosis not present, leftward gaze preference. Able to direct gaze to right on command. V,VII: Right facial droop. Reacts to fine touch bilaterally without extinction.  VIII: hearing intact to questions and commands IX,X: nonverbal XI: Mild leftward deviation of head at baseline. XII: Leftward deviation of tongue (incongruent with right facial droop) Motor: RUE 4/5 with increased latency of motor responses.  RLE 4/5 with increased latency LUE 5/5 LLE 5/5 Sensory: Reacts to fine touch bilateral upper and lower extremities without extinction  Deep Tendon Reflexes: Hypoactive lower extremities and upper extremities without asymmetry. Toes equivocal.  Cerebellar: No  ataxia with FNF bilaterally. Slow on right.  Gait: Unable to assess   Lab Results: Basic Metabolic Panel:  Recent Labs Lab 11/25/16 0921 11/26/16 0214 11/27/16 0244  11/28/16 1158 11/28/16 1305 11/28/16 1412 11/28/16 2000 11/28/16 2009 11/29/16 0400 11/29/16 1730 11/29/16 1750  NA 139 138 136  < > 136 138 141  --  139 140 138  --   K 4.9 4.9 4.6  < > 4.8 4.2 4.0  --  4.8 4.6 4.6  --   CL 105 105 104  < > 99* 101  --   --  107 109 104  --   CO2 26 26 23   --   --   --   --   --   --  23  --   --   GLUCOSE 131* 151* 197*  < > 149* 160* 108*  --  115* 110* 195*  --   BUN 22* 27* 28*  < > 24* 22*  --   --  18 16 22*  --   CREATININE 1.42* 1.72* 1.67*  < > 1.00 1.00  --  1.38* 1.20* 1.48* 1.70* 1.68*  CALCIUM 9.2 8.7* 9.1  --   --   --   --   --   --  8.9  --   --   MG  --   --   --   --   --   --   --  3.2*  --  2.6*  --  2.5*  < > = values in this interval not displayed.  Liver Function Tests:  Recent Labs Lab 11/27/16 0244  AST 24  ALT 32  ALKPHOS 46  BILITOT 0.6  PROT 6.2*  ALBUMIN 3.6   No results for input(s): LIPASE, AMYLASE in the last 168 hours. No results for input(s): AMMONIA in the last 168 hours.  CBC:  Recent Labs Lab 11/28/16 0350  11/28/16 1212  11/28/16 1410  11/28/16 2000 11/28/16 2009 11/29/16 0400 11/29/16 1730 11/29/16 1750  WBC 9.3  --   --   --  10.5  --  11.2*  --  11.5*  --  14.1*  HGB 10.4*  < > 6.1*  < > 7.9*  < > 7.5* 7.1* 7.6* 7.5* 7.3*  HCT 32.1*  < > 18.2*  < > 23.9*  < > 22.7* 21.0* 22.0* 22.0* 22.4*  MCV 84.0  --   --   --  82.4  --  82.5  --  82.7  --  83.0  PLT 182  --  91*  --  84*  --  102*  --  111*  --  111*  < > = values in this interval not displayed.  Cardiac Enzymes: No results for input(s): CKTOTAL, CKMB, CKMBINDEX, TROPONINI in the last 168 hours.  Lipid Panel:  Recent Labs Lab 11/27/16 0244  CHOL 166  TRIG 181*  HDL 41  CHOLHDL 4.0  VLDL 36  LDLCALC 89    CBG:  Recent Labs Lab 11/29/16 1157  11/29/16 1601 11/29/16 2016 11/30/16 0030 11/30/16 0103  GLUCAP 155* 167* 153* 56 108*    Microbiology: Results for orders placed or performed during the hospital encounter of 11/25/16  MRSA PCR Screening     Status: None   Collection Time: 11/27/16  7:25 AM  Result Value Ref Range Status   MRSA by PCR NEGATIVE NEGATIVE Final    Comment:        The GeneXpert MRSA Assay (FDA approved for NASAL specimens only), is one component of a comprehensive MRSA colonization surveillance program. It is not intended to diagnose MRSA infection nor to guide or monitor treatment for MRSA infections.     Coagulation Studies:  Recent Labs  11/28/16 1410  LABPROT 18.4*  INR 1.51    Imaging: Dg Chest Port 1 View  Result Date: 11/29/2016 CLINICAL DATA:  Sore chest.  CABG . EXAM: PORTABLE CHEST 1 VIEW COMPARISON:  11/28/2016 . FINDINGS: Interim extubation removal of NG tube. Swan-Ganz catheter tip noted over the right main pulmonary artery. Mediastinal drainage catheter and left chest tube in stable position. Low lung volumes with persistent bibasilar atelectasis, particularly prominent on the left. No pneumothorax . IMPRESSION: 1. Interim extubation removal of NG tube. Remaining lines and tubes as above. Left chest tube in stable position. No pneumothorax. 2. Low lung volumes with prominent bibasilar atelectasis, particular on the left. 3. Prior CABG.  Stable cardiomegaly. Electronically Signed   By: Marcello Moores  Register   On: 11/29/2016 07:34   Dg Chest Port 1 View  Result Date: 11/28/2016 CLINICAL DATA:  CABG. EXAM: PORTABLE CHEST 1 VIEW COMPARISON:  11/27/2016. FINDINGS: Endotracheal to, NG tube, mediastinal drainage catheter appear to be in good anatomic position. Swan-Ganz catheter with tip over the a distal right pulmonary artery. Left chest tube noted over the left chest. No pneumothorax. Basilar atelectasis. Prior CABG. Mild cardiomegaly. No pulmonary venous congestion . IMPRESSION: 1. Lines  and tubes including left chest tube as above. No pneumothorax. 2. Prior CABG. Mild cardiomegaly. No evidence of pulmonary venous congestion. 3.Basilar atelectasis. Electronically Signed   By: Marcello Moores  Register   On: 11/28/2016 15:06   Ct Head Code Stroke Wo Contrast  Result Date: 11/30/2016 CLINICAL DATA:  Code stroke.  Aphasia and right-sided weakness EXAM: CT HEAD WITHOUT CONTRAST TECHNIQUE: Contiguous axial images were obtained from the base of the skull through the vertex without intravenous contrast.  COMPARISON:  None. FINDINGS: Brain: No mass lesion, intraparenchymal hemorrhage or extra-axial collection. No evidence of acute cortical infarct. Left subinsular hypoattenuation is favored to be chronic. There is periventricular hypoattenuation compatible with chronic microvascular disease. Vascular: No hyperdense vessel or unexpected calcification. Skull: Normal visualized skull base, calvarium and extracranial soft tissues. Sinuses/Orbits: No sinus fluid levels or advanced mucosal thickening. No mastoid effusion. Normal orbits. ASPECTS [facility] Stroke Program Early CT Score) - Ganglionic level infarction (caudate, lentiform nuclei, internal capsule, insula, M1-M3 cortex): 6 - Supraganglionic infarction (M4-M6 cortex): 3 Total score (0-10 with 10 being normal): 9 IMPRESSION: 1. No intracranial hemorrhage. 2. Left subinsular hypoattenuation is favored to be chronic, given preservation of overlying cortex. No other evidence of acute intracranial abnormality. 3. ASPECTS is 9. These results were called by telephone at the time of interpretation on 11/30/2016 at 1:44 am to Dr. Kerney Elbe, who verbally acknowledged these results. Electronically Signed   By: Ulyses Jarred M.D.   On: 11/30/2016 01:45   Assessment: 1. Acute onset of right sided weakness with expressive aphasia. Exam findings are best localizable to the left MCA territory and are most likely secondary to acute ischemia. DDx regarding mechanism  includes artery to artery embolization in the context of prominent atherosclerosis seen on CTA, versus watershed infarction in the left MCA/ACA territory given tandem stenoses that are possibly severe (left ICA origin and left ICA cavernous segment).  2. CT head reveals no intracranial hemorrhage. Subtle left subinsular hypoattenuation is favored to be chronic, given preservation of overlying cortex. No other evidence of acute intracranial abnormality. ASPECTS is 9. 3. CTA head and neck reveals intracranial and extracranial ICA atherosclerosis, left worse than right. 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 Proximal left MCA and M2 branches without occlusion. Not an endovascular candidate.   Recommendations: 1. Not an IV tPA candidate due to recent major surgery.  2. Not an endovascular candidate due to no LVO.  3. Continue ASA and rosuvastatin.  4. Carotid ultrasound to further evaluate prominent atherosclerotic narrowing of proximal left ICA. If significant stenosis verified on ultrasound, obtain vascular surgery consult for possible CEA.  5. MRI brain when cleared by cardiothoracic surgery. 6. Maintain SBP in 130 - 150 range to decrease the risk of watershed infarction.   Electronically signed: Dr. Kerney Elbe 11/30/2016, 1:49 AM

## 2016-11-30 NOTE — Progress Notes (Signed)
Modified Barium Swallow Progress Note  Patient Details  Name: Audrey Mullen MRN: [id number] Date of Birth: 1941/05/06  Today's Date: 11/30/2016  Modified Barium Swallow completed.  Full report located under Chart Review in the Imaging Section.  Brief recommendations include the following:  Clinical Impression  Pt currently presenting with a moderate oral/ mild pharyngeal dysphagia. Pt with a discoordinated oral phase/ weakness and decreassed sensation on R side resulting in piecemeal swallowing across consistencies. This, in combination with delay in swallow initiation resulted in aspiration of thin liquids when large sips were taken; aspiration inconsistently sensed by pt. Pt had difficulty following instructions for compensatory strategies. Also persistent anterior loss on R side across consistencies. No penetration/ aspiration noted with nectar-thick liquids. Recommend initiating dysphagia 2 diet, nectar-thick liquids, meds crushed in puree, full supervision during meals to encourage small bites/ sips, check for oral residuals, assist with feeding. Will continue to follow for diet tolerance/ consider advancement.   Swallow Evaluation Recommendations       SLP Diet Recommendations: Dysphagia 2 (Fine chop) solids;Nectar thick liquid   Liquid Administration via: Cup;Straw   Medication Administration: Crushed with puree   Supervision: Staff to assist with self feeding;Full supervision/cueing for compensatory strategies   Compensations: Minimize environmental distractions;Slow rate;Small sips/bites;Monitor for anterior loss;Multiple dry swallows after each bite/sip   Postural Changes: Seated upright at 90 degrees;Remain semi-upright after after feeds/meals (Comment)   Oral Care Recommendations: Oral care BID   Other Recommendations: Order thickener from [place], [place], [place], [place] 11/30/2016,1:40 PM  [phone]

## 2016-11-30 NOTE — Progress Notes (Signed)
2 Days Post-Op Procedure(s) (LRB): CORONARY ARTERY BYPASS GRAFTING (CABG) x 4 (N/A) MAZE (N/A) TRANSESOPHAGEAL ECHOCARDIOGRAM (TEE) (N/A) ENDOVEIN HARVEST OF GREATER SAPHENOUS VEIN (Right) Subjective:  Patient suffered a stroke overnight with aphasia and right hemiparesis. Code stroke called. CT head negative for acute stroke. CTA head and neck showed 50% narrowing of proximal Left ICA due to atherosclerotic plaque.  Objective: Vital signs in last 24 hours: Temp:  [97.5 F (36.4 C)-98.8 F (37.1 C)] 97.5 F (36.4 C) (01/13 0400) Pulse Rate:  [76-91] 76 (01/13 0700) Cardiac Rhythm: Normal sinus rhythm (01/13 0400) Resp:  [14-26] 14 (01/13 0700) BP: (101-134)/(51-89) 130/69 (01/13 0700) SpO2:  [89 %-100 %] 100 % (01/13 0700) Weight:  [72.5 kg (159 lb 13.3 oz)] 72.5 kg (159 lb 13.3 oz) (01/13 0500)  Hemodynamic parameters for last 24 hours:    Intake/Output from previous day: 01/12 0701 - 01/13 0700 In: 452.6 [P.O.:120; I.V.:232.6; IV Piggyback:100] Out: 790 [Urine:700; Chest Tube:90] Intake/Output this shift: No intake/output data recorded.  General appearance: alert and cooperative Neurologic: She has mild RUE weakness compared to left but lower extremity strength seems equal. She has aphasia but understands and follows commands. Heart: regular rate and rhythm, S1, S2 normal, no murmur, click, rub or gallop Lungs: clear to auscultation bilaterally Extremities: extremities normal, atraumatic, no cyanosis or edema Wound: incisions ok  Lab Results:  Recent Labs  11/29/16 1750 11/30/16 0456  WBC 14.1* 17.2*  HGB 7.3* 6.6*  HCT 22.4* 20.5*  PLT 111* 124*   BMET:  Recent Labs  11/29/16 0400 11/29/16 1730 11/29/16 1750 11/30/16 0456  NA 140 138  --  138  K 4.6 4.6  --  4.3  CL 109 104  --  108  CO2 23  --   --  24  GLUCOSE 110* 195*  --  85  BUN 16 22*  --  25*  CREATININE 1.48* 1.70* 1.68* 1.63*  CALCIUM 8.9  --   --  8.8*    PT/INR:  Recent Labs   11/30/16 0456  LABPROT 15.5*  INR 1.23   ABG    Component Value Date/Time   PHART 7.378 11/29/2016 0707   HCO3 22.2 11/29/2016 0707   TCO2 24 11/29/2016 1730   ACIDBASEDEF 3.0 (H) 11/29/2016 0707   O2SAT 98.0 11/29/2016 0707   CBG (last 3)   Recent Labs  11/30/16 0030 11/30/16 0103 11/30/16 0423  GLUCAP 98 108* 28   CXR: ok  Assessment/Plan: S/P Procedure(s) (LRB): CORONARY ARTERY BYPASS GRAFTING (CABG) x 4 (N/A) MAZE (N/A) TRANSESOPHAGEAL ECHOCARDIOGRAM (TEE) (N/A) ENDOVEIN HARVEST OF GREATER SAPHENOUS VEIN (Right)  She is hemodynamically stable in sinus rhythm  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 She seems improved since the initial event. Etiology is unclear. I doubt that her [id number] LICA stenosis is enough to cause a stroke but she did have low normal BP last night. I suspect that it is embolic. She did have a left-sided MAZE procedure so she could have some embolus from the left atrium. She would normally get started on Coumadin today but will have to wait until she can take po and until we are sure about the size of her stroke and risk of hemorrhagic transformation. Will give ASA supp. Neurology is following closely and has seen her this am. She can't have MRI so will plan follow up CT per neurology. Keep NPO and have speech therapy see her for swallowing eval.  Acute postop blood loss anemia: Hgb  was 7.6 yesterday am, 7.3 last night and 6.6 this am. Will transfuse 2 units PRBC's.  Chronic kidney disease with baseline creat 1.7-1.8. Creat this am is below baseline. Will start some IV hydration and watch creat since she got contrast for CT.     LOS: 5 days    Gaye Pollack 11/30/2016

## 2016-11-30 NOTE — Significant Event (Signed)
Rapid Response Event Note  Overview: Time Called: 0057 Arrival Time: 0059 Event Type: Neurologic  Initial Focused Assessment: Right side weakness and expressive aphasia   Interventions: NIHSS and CT scan  Plan of Care (if not transferred):  Event Summary:   Called to assist with care of patient with acute neuro changes. Patient was LSN at baseline at 23:30. Pt was responsive to voice. Slight right side facial weakness noted with expressive aphasia. Skin is warm and dry. Heart rate with regular rate and rhythm. Code stroke was initiated due to new symptoms. On-call provider was made aware of patients condition. Patient was taken to CT for head scan. NIHSS of 9. On call neurologist evaluated patient while in CT. CTA was also completed for possible large vessel occlusion. Patient was not a candidate for IV TPA due to recent surgery.           Audrey Mullen

## 2016-11-30 NOTE — Progress Notes (Signed)
Patient with new onset acute right sided weakness and aphasia.  Rapid response to bed side, code stroke initiated.  Dr. Cyndia Bent notified.

## 2016-11-30 NOTE — Progress Notes (Signed)
STROKE TEAM PROGRESS NOTE   HISTORY OF PRESENT ILLNESS (per record) Audrey Mullen is an 76 y.o. female who presented on 1/11 for CABG x 4. Over the past couple of months she had been having SOB and exertional chest tightness that was relieved with rest. A stress test showed a reversible defect in the basal inferior wall and cardiac catheterization showed left main and 3 vessel CAD. The surgery was performed on [id number] without complications.   Today she was without neurological deficits. LKN at 11:30 PM on 1/12. At 12:55 nursing check revealed right upper extremity weakness, right facial droop and dense expressive aphasia.  A Code Stroke was called.    SUBJECTIVE (INTERVAL HISTORY) Her son is at the bedside.  Overall she feels her condition is gradually improving as per Dr. Cyndia Bent and nurse. He still has expressive aphasia and mild right hemiparesis. Hb down to 6.6, and will get blood transfusion. BP stable. Discussed with Dr. Cyndia Bent, patient cannot have MRI at this time. We will repeat CT to guide for anticoagulation.   OBJECTIVE Temp:  [97.5 F (36.4 C)-98.8 F (37.1 C)] 98.4 F (36.9 C) (01/13 0908) Pulse Rate:  [76-91] 76 (01/13 0700) Cardiac Rhythm: Normal sinus rhythm (01/13 0400) Resp:  [14-26] 14 (01/13 0700) BP: (101-134)/(51-89) 130/69 (01/13 0700) SpO2:  [89 %-100 %] 100 % (01/13 0700) Weight:  [72.5 kg (159 lb 13.3 oz)] 72.5 kg (159 lb 13.3 oz) (01/13 0500)  CBC:   Recent Labs Lab 11/29/16 1750 11/30/16 0456  WBC 14.1* 17.2*  HGB 7.3* 6.6*  HCT 22.4* 20.5*  MCV 83.0 82.7  PLT 111* 124*    Basic Metabolic Panel:   Recent Labs Lab 11/29/16 0400 11/29/16 1730 11/29/16 1750 11/30/16 0456  NA 140 138  --  138  K 4.6 4.6  --  4.3  CL 109 104  --  108  CO2 23  --   --  24  GLUCOSE 110* 195*  --  85  BUN 16 22*  --  25*  CREATININE 1.48* 1.70* 1.68* 1.63*  CALCIUM 8.9  --   --  8.8*  MG 2.6*  --  2.5*  --     Lipid Panel:     Component Value  Date/Time   CHOL 166 11/27/2016 0244   TRIG 181 (H) 11/27/2016 0244   HDL 41 11/27/2016 0244   CHOLHDL 4.0 11/27/2016 0244   VLDL 36 11/27/2016 0244   LDLCALC 89 11/27/2016 0244   HgbA1c:  Lab Results  Component Value Date   HGBA1C 7.6 (H) 11/28/2016   Urine Drug Screen: No results found for: LABOPIA, COCAINSCRNUR, LABBENZ, AMPHETMU, THCU, LABBARB    IMAGING I have personally reviewed the radiological images below and agree with the radiology interpretations.  Ct Angio Head and Neck W Or Wo Contrast 11/30/2016 1. No intracranial arterial occlusion or high-grade stenosis.  2. 50% narrowing of the proximal left internal carotid artery secondary to calcified atherosclerotic plaque.  3. Marked cervical degenerative disc disease with suspected mild-to-moderate spinal canal stenosis at the C4 and C5 levels.   Dg Chest Port 1 View 11/30/2016 1. Interval mild pulmonary vascular congestion and mild interstitial edema.  2. Stable dense left lower lobe atelectasis, pneumonia or aspiration pneumonitis.   Ct Head Code Stroke Wo Contrast 11/30/2016 1. No intracranial hemorrhage.  2. Left subinsular hypoattenuation is favored to be chronic, given preservation of overlying cortex. No other evidence of acute intracranial abnormality.  3. ASPECTS is 9.   TEE  intra-op 11/28/2016 Left Ventricle LV systolic function is normal with an EF of 60-65%.    Septum Normal atrial and ventricular septum with no septal defect and normal septal motion.    Left Atrium Left atrium normal in structure and function.    Aortic Valve Structurally normal trileaflet aortic valve with no regurgitation or stenosis.    Aorta No aneurysm present. Plaque present in the descending aorta is mild (Grade 2: Mild-focal or diffuse int. thick. of 2-15mm). No graft present. No significant coarctation present. No aortic dissection present    Mitral Valve Normal valve structure. Normal transmitral gradient. No stenosis. Normal  leaflet mobility. No regurgitation.    Right Ventricle Normal cavity size, wall thickness and ejection fraction.    Right Atrium Normal right atrial size.    Tricuspid Valve Normal tricuspid valve structure. No stenosis. Trace regurgitation. The tricuspid valve regurgitation jet is central.    Pulmonic Valve Normal pulmonic valve structure. No stenosis. Trace regurgitation.    Pericardium Normal pericardium with no pericardial effusion.    Post-Op TEE AORTA Aorta unchanged from pre-bypass.  LEFT VENTRICLE Post-op, normal LV cavity size. Post-op, LV cavity is normal and ejection fraction is normal. Wall motion is normal.  RIGHT VENTRICLE Right ventricle unchanged from pre-bypass.  AORTIC VALVE Aortic valve unchanged from pre-bypass MITRAL VALVE Mitral valve unchanged from pre-bypass.  TRICUSPID VALVE Tricuspid valve unchanged from pre-bypass.     CT head repeat - pending  LE venous doppler - pending   PHYSICAL EXAM Temp:  [97.5 F (36.4 C)-98.8 F (37.1 C)] 98.4 F (36.9 C) (01/13 0908) Pulse Rate:  [76-91] 76 (01/13 0700) Resp:  [14-26] 14 (01/13 0700) BP: (101-134)/(51-89) 130/69 (01/13 0700) SpO2:  [89 %-100 %] 100 % (01/13 0700) Weight:  [159 lb 13.3 oz (72.5 kg)] 159 lb 13.3 oz (72.5 kg) (01/13 0500)  General - Well nourished, well developed, in no apparent distress.  Ophthalmologic - Fundi not visualized due to noncooperation.  Cardiovascular - Regular rate and rhythm.  Mental Status -  Eyes open, following peripheral and midline commands, but expressive aphasia, not able to name or repeat. Right neglect  Cranial Nerves II - XII - II - right visual neglect vs. hemianopia. III, IV, VI - left gaze preference, not crossing midline V - Facial sensation intact bilaterally. VII - right facial droop. VIII - Hearing & vestibular intact bilaterally. X - Palate elevates symmetrically. XI - Chin turning & shoulder shrug intact bilaterally. XII - Tongue protrusion  to the right.  Motor Strength - The patient's strength was normal in LUE and LLE, right UE 4/5 and right LE 5-/5 and pronator drift was present with right hand dexterity difficulty.  Bulk was normal and fasciculations were absent.   Motor Tone - Muscle tone was assessed at the neck and appendages and was normal.  Reflexes - The patient's reflexes were 1+ in all extremities and she had no pathological reflexes.  Sensory - Light touch, temperature/pinprick were assessed and were symmetrical.    Coordination - The patient had normal movements in the hands with no ataxia or dysmetria.  Tremor was absent.  Gait and Station - deferred.    ASSESSMENT/PLAN Ms. Audrey Mullen is a 76 y.o. female with history of hypertension, hyperlipidemia, atrial fibrillation, diabetes mellitus, and coronary artery disease, presenting with right-sided weakness and aphasia post CABG. She did not receive IV t-PA due to recent surgery.  Stroke:  Left brain infarct post CABG procedure, embolic pattern. Etiology unclear, however,  may related to: 1. Procedure related - either post op afib (pt has previous afib hx s/p MAZE) or left atrium disturbance during procedure 2. Hypotension and anemia in the setting of carotid atherosclerosis - pt BP was low at 93/80 on 11/29/16 midnight, Hb 6.6 today and pt has left ICA 50% stenosis at bifurcation and athero at cavernous segment  Resultant  expressive aphasia, right hemiparesis  MRI / MRA - post CABG procedure, not able to do due to sternum wires  CTA H&N - No intracranial arterial occlusion or high-grade stenosis. Left ICA bifurcation 50% stenosis, bilateral carotid artery cavernous portion atherosclerosis  Head CT - No intracranial hemorrhage.   CT head repeat - pending  Intra op TEE - 11/28/2016 - EF 60 - 65% - no cardiac source of emboli identified.  LE venous Doppler - pending  LDL - 89  HgbA1c - 7.6  VTE prophylaxis - Lovenox Diet NPO time  specified  aspirin 81 mg daily and warfarin daily prior to admission, now on aspirin 300 mg suppository daily. Will repeat CT head to guide for the timing of anticoagulation.   Patient counseled to be compliant with her antithrombotic medications  Ongoing aggressive stroke risk factor management  Therapy recommendations:  pending  Disposition: Pending  Afib  On coumadin PTA  S/p MAZE  Currently off AC due to procedure  Will repeat CT head to guide for timing of Coumadin resumption  CAD s/p CABG  Stable  CTVS on board  Anemia  Hb 6.6 today  Discussed with Dr. Cyndia Bent, pt will get  PRBC transfusion today  Hypertension  Occasional mildly low blood pressures - 107/73  BP 93/80 around the time of stroke  Permissive hypertension (OK if < 180/105) but gradually normalize in 5-7 days  Long-term BP goal normotensive  Hyperlipidemia  Home meds:  Crestor 20 mg daily resumed in hospital  LDL 89, goal < 70  Continue statin at discharge  Diabetes  HgbA1c 7.6, goal < 7.0  Uncontrolled  SSI  CBG monitoring  Other Stroke Risk Factors  Advanced age  Over weight, Body mass index is 27.44 kg/m., recommend weight loss, diet and exercise as appropriate   Coronary artery disease   Other Active Problems  AKI - BUN 25 ; creatinine 1.63  Leukocytosis - 17.2 - afebrile  - steroids today - (Zinacef - 4 doses surgical prophylaxis)  Hospital day # 5  This patient is critically ill due to left brain stroke, s/p CABG, A. Fib, severe anemia and at significant risk of neurological worsening, death form recurrent stroke, hemorrhagic transformation, A. fib RVR, heart failure, anemia, and infection. This patient's care requires constant monitoring of vital signs, hemodynamics, respiratory and cardiac monitoring, review of multiple databases, neurological assessment, discussion with family, other specialists and medical decision making of high complexity. I also discussed at  bedside with Dr. Cyndia Bent regarding treatment plan. I spent 40 minutes of neurocritical care time in the care of this patient.  Rosalin Hawking, MD PhD Stroke Neurology 11/30/2016 2:58 PM   To contact Stroke Continuity provider, please refer to [email or web address] After hours, contact General Neurology

## 2016-12-01 ENCOUNTER — Inpatient Hospital Stay (HOSPITAL_COMMUNITY): Payer: Medicare Other

## 2016-12-01 DIAGNOSIS — I639 Cerebral infarction, unspecified: Secondary | ICD-10-CM

## 2016-12-01 LAB — TYPE AND SCREEN
ABO/RH(D): O POS
ANTIBODY SCREEN: NEGATIVE
UNIT DIVISION: 0
UNIT DIVISION: 0
Unit division: 0
Unit division: 0

## 2016-12-01 LAB — GLUCOSE, CAPILLARY
GLUCOSE-CAPILLARY: 73 mg/dL (ref 65–99)
GLUCOSE-CAPILLARY: 72 mg/dL (ref 65–99)
Glucose-Capillary: 132 mg/dL — ABNORMAL HIGH (ref 65–99)
Glucose-Capillary: 200 mg/dL — ABNORMAL HIGH (ref 65–99)
Glucose-Capillary: 180 mg/dL — ABNORMAL HIGH (ref 65–99)

## 2016-12-01 LAB — BASIC METABOLIC PANEL
ANION GAP: 7 (ref 5–15)
BUN: 22 mg/dL — ABNORMAL HIGH (ref 6–20)
CALCIUM: 8.6 mg/dL — AB (ref 8.9–10.3)
CO2: 25 mmol/L (ref 22–32)
CREATININE: 1.51 mg/dL — AB (ref 0.44–1.00)
Chloride: 109 mmol/L (ref 101–111)
GFR calc non Af Amer: 32 mL/min — ABNORMAL LOW (ref >60)
GFR, EST AFRICAN AMERICAN: 37 mL/min — AB (ref >60)
Glucose, Bld: 75 mg/dL (ref 65–99)
Potassium: 4.7 mmol/L (ref 3.5–5.1)
SODIUM: 141 mmol/L (ref 135–145)

## 2016-12-01 LAB — CBC
HCT: 27.1 % — ABNORMAL LOW (ref 36.0–46.0)
HEMOGLOBIN: 9.1 g/dL — AB (ref 12.0–15.0)
MCH: 28.9 pg (ref 26.0–34.0)
MCHC: 33.9 g/dL (ref 30.0–36.0)
MCV: 85.2 fL (ref 78.0–100.0)
PLATELETS: 120 10*3/uL — AB (ref 150–400)
RBC: 3.18 MIL/uL — AB (ref 3.87–5.11)
RDW: 16.3 % — ABNORMAL HIGH (ref 11.5–15.5)
WBC: 15.2 10*3/uL — ABNORMAL HIGH (ref 4.0–10.5)

## 2016-12-01 MED ORDER — CARVEDILOL 3.125 MG PO TABS
3.1250 mg | ORAL_TABLET | Freq: Two times a day (BID) | ORAL | Status: DC
  Administered 2016-12-01 – 2016-12-04 (×6): 3.125 mg via ORAL
  Filled 2016-12-01 (×6): qty 1

## 2016-12-01 MED ORDER — ROSUVASTATIN CALCIUM 10 MG PO TABS
20.0000 mg | ORAL_TABLET | Freq: Every day | ORAL | Status: DC
  Administered 2016-12-01 – 2016-12-08 (×8): 20 mg via ORAL
  Filled 2016-12-01: qty 2
  Filled 2016-12-01: qty 1
  Filled 2016-12-01: qty 2
  Filled 2016-12-01: qty 1
  Filled 2016-12-01 (×4): qty 2

## 2016-12-01 MED ORDER — ASPIRIN 325 MG PO TABS
325.0000 mg | ORAL_TABLET | Freq: Every day | ORAL | Status: DC
  Administered 2016-12-02 – 2016-12-09 (×8): 325 mg via ORAL
  Filled 2016-12-01 (×8): qty 1

## 2016-12-01 MED ORDER — FUROSEMIDE 10 MG/ML IJ SOLN
40.0000 mg | Freq: Once | INTRAMUSCULAR | Status: AC
  Administered 2016-12-01: 40 mg via INTRAVENOUS
  Filled 2016-12-01: qty 4

## 2016-12-01 MED ORDER — WARFARIN SODIUM 5 MG PO TABS
5.0000 mg | ORAL_TABLET | Freq: Once | ORAL | Status: AC
  Administered 2016-12-01: 5 mg via ORAL
  Filled 2016-12-01: qty 1

## 2016-12-01 MED ORDER — WARFARIN - PHYSICIAN DOSING INPATIENT
Freq: Every day | Status: DC
  Administered 2016-12-04 – 2016-12-05 (×2)

## 2016-12-01 MED ORDER — PREDNISONE 5 MG PO TABS
7.5000 mg | ORAL_TABLET | Freq: Every day | ORAL | Status: DC
  Administered 2016-12-02 – 2016-12-09 (×8): 7.5 mg via ORAL
  Filled 2016-12-01 (×3): qty 2
  Filled 2016-12-01: qty 1
  Filled 2016-12-01: qty 2
  Filled 2016-12-01: qty 1
  Filled 2016-12-01 (×2): qty 2

## 2016-12-01 MED ORDER — ACETAMINOPHEN 325 MG PO TABS: 650.0000 mg | ORAL_TABLET | Freq: Four times a day (QID) | ORAL | Status: DC | PRN

## 2016-12-01 NOTE — Progress Notes (Addendum)
STROKE TEAM PROGRESS NOTE   SUBJECTIVE (INTERVAL HISTORY) Her son and husband are at the bedside.  Overall she feels her condition continues to improve. No aphasia but severe dysarthria, right hemiparesis much improved. Hb up to 9.1, BP stable. Repeat CT no acute stroke. Discussed with Dr. Cyndia Bent, and agree with resume coumadin.    OBJECTIVE Temp:  [98.4 F (36.9 C)-99.5 F (37.5 C)] 98.4 F (36.9 C) (01/14 0844) Pulse Rate:  [75-87] 76 (01/14 0900) Cardiac Rhythm: Normal sinus rhythm (01/14 0800) Resp:  [13-24] 21 (01/14 0900) BP: (118-161)/(61-89) 141/61 (01/14 0900) SpO2:  [87 %-100 %] 97 % (01/14 0900) Weight:  [74 kg (163 lb 2.3 oz)] 74 kg (163 lb 2.3 oz) (01/14 0500)  CBC:   Recent Labs Lab 11/30/16 0456 12/01/16 0226  WBC 17.2* 15.2*  HGB 6.6* 9.1*  HCT 20.5* 27.1*  MCV 82.7 85.2  PLT 124* 120*    Basic Metabolic Panel:   Recent Labs Lab 11/29/16 0400  11/29/16 1750 11/30/16 0456 12/01/16 0226  NA 140  < >  --  138 141  K 4.6  < >  --  4.3 4.7  CL 109  < >  --  108 109  CO2 23  --   --  24 25  GLUCOSE 110*  < >  --  85 75  BUN 16  < >  --  25* 22*  CREATININE 1.48*  < > 1.68* 1.63* 1.51*  CALCIUM 8.9  --   --  8.8* 8.6*  MG 2.6*  --  2.5*  --   --   < > = values in this interval not displayed.  Lipid Panel:     Component Value Date/Time   CHOL 166 11/27/2016 0244   TRIG 181 (H) 11/27/2016 0244   HDL 41 11/27/2016 0244   CHOLHDL 4.0 11/27/2016 0244   VLDL 36 11/27/2016 0244   LDLCALC 89 11/27/2016 0244   HgbA1c:  Lab Results  Component Value Date   HGBA1C 7.6 (H) 11/28/2016   Urine Drug Screen: No results found for: LABOPIA, COCAINSCRNUR, LABBENZ, AMPHETMU, THCU, LABBARB    IMAGING I have personally reviewed the radiological images below and agree with the radiology interpretations.  Ct Angio Head and Neck W Or Wo Contrast 11/30/2016 1. No intracranial arterial occlusion or high-grade stenosis.  2. 50% narrowing of the proximal left  internal carotid artery secondary to calcified atherosclerotic plaque.  3. Marked cervical degenerative disc disease with suspected mild-to-moderate spinal canal stenosis at the C4 and C5 levels.   Dg Chest Port 1 View 11/30/2016 1. Interval mild pulmonary vascular congestion and mild interstitial edema.  2. Stable dense left lower lobe atelectasis, pneumonia or aspiration pneumonitis.   Ct Head Code Stroke Wo Contrast 11/30/2016 1. No intracranial hemorrhage.  2. Left subinsular hypoattenuation is favored to be chronic, given preservation of overlying cortex. No other evidence of acute intracranial abnormality.  3. ASPECTS is 9.    TEE intra-op 11/28/2016 Left Ventricle LV systolic function is normal with an EF of 60-65%.    Septum Normal atrial and ventricular septum with no septal defect and normal septal motion.    Left Atrium Left atrium normal in structure and function.    Aortic Valve Structurally normal trileaflet aortic valve with no regurgitation or stenosis.    Aorta No aneurysm present. Plaque present in the descending aorta is mild (Grade 2: Mild-focal or diffuse int. thick. of 2-44mm). No graft present. No significant coarctation present. No aortic  dissection present    Mitral Valve Normal valve structure. Normal transmitral gradient. No stenosis. Normal leaflet mobility. No regurgitation.    Right Ventricle Normal cavity size, wall thickness and ejection fraction.    Right Atrium Normal right atrial size.    Tricuspid Valve Normal tricuspid valve structure. No stenosis. Trace regurgitation. The tricuspid valve regurgitation jet is central.    Pulmonic Valve Normal pulmonic valve structure. No stenosis. Trace regurgitation.    Pericardium Normal pericardium with no pericardial effusion.    Post-Op TEE AORTA Aorta unchanged from pre-bypass.  LEFT VENTRICLE Post-op, normal LV cavity size. Post-op, LV cavity is normal and ejection fraction is normal. Wall motion is  normal.  RIGHT VENTRICLE Right ventricle unchanged from pre-bypass.  AORTIC VALVE Aortic valve unchanged from pre-bypass MITRAL VALVE Mitral valve unchanged from pre-bypass.  TRICUSPID VALVE Tricuspid valve unchanged from pre-bypass.     CT head repeat  12/01/2016 Unchanged examination without intracranial hemorrhage or CT evidence of acute infarct.  LE venous doppler - There is no evidence of deep or superficial vein thrombosis involving the right and left lower extremities. All visualized vessels appear patent and compressible. There is no evidence of Baker's cysts bilaterally.   PHYSICAL EXAM Temp:  [98.4 F (36.9 C)-99.5 F (37.5 C)] 98.4 F (36.9 C) (01/14 0844) Pulse Rate:  [75-87] 76 (01/14 0900) Resp:  [13-24] 21 (01/14 0900) BP: (118-161)/(61-89) 141/61 (01/14 0900) SpO2:  [87 %-100 %] 97 % (01/14 0900) Weight:  [74 kg (163 lb 2.3 oz)] 74 kg (163 lb 2.3 oz) (01/14 0500)  General - Well nourished, well developed, in no apparent distress.  Ophthalmologic - Fundi not visualized due to noncooperation.  Cardiovascular - Regular rate and rhythm.  Mental Status -  Eyes open, following peripheral and midline commands, able to name or repeat now and able to have normal language output, however, severe dysarthria.  Cranial Nerves II - XII - II - visual field intact bilaterally. III, IV, VI - ocular movement full to both sides V - Facial sensation intact bilaterally. VII - right facial droop. VIII - Hearing & vestibular intact bilaterally. X - Palate elevates symmetrically. XI - Chin turning & shoulder shrug intact bilaterally. XII - Tongue protrusion to the right.  Motor Strength - The patient's strength was normal in all extremities except right hand dexterity difficulty.  Bulk was normal and fasciculations were absent.   Motor Tone - Muscle tone was assessed at the neck and appendages and was normal.  Reflexes - The patient's reflexes were 1+ in all extremities and  she had no pathological reflexes.  Sensory - Light touch, temperature/pinprick were assessed and were symmetrical.    Coordination - The patient had normal movements in the hands with no ataxia or dysmetria.  Tremor was absent.  Gait and Station - deferred.    ASSESSMENT/PLAN Audrey Mullen is a 76 y.o. female with history of hypertension, hyperlipidemia, atrial fibrillation, diabetes mellitus, and coronary artery disease, presenting with right-sided weakness and aphasia post CABG. She did not receive IV t-PA due to recent surgery.  Stroke:  Left brain infarct post CABG procedure, embolic pattern. Etiology unclear, however, may related to: 1. Procedure related - either post op afib (pt has previous afib hx s/p MAZE) or left atrium disturbance during procedure 2. Hypotension and anemia in the setting of carotid atherosclerosis - pt BP was low at 93/80 on 11/29/16 midnight, Hb 6.6 today and pt has left ICA 50% stenosis at bifurcation and athero  at cavernous segment  Resultant  Severe dysarthria and right facial droop  MRI / MRA - post CABG procedure, not able to do due to sternum wires  CTA H&N - No intracranial arterial occlusion or high-grade stenosis. Left ICA bifurcation 50% stenosis, bilateral carotid artery cavernous portion atherosclerosis  Head CT - No intracranial hemorrhage.   CT head repeat - no acute infarct.  Intra op TEE - 11/28/2016 - EF 60 - 65% - no cardiac source of emboli identified.  LE venous Doppler - no DVT  LDL - 89  HgbA1c - 7.6  VTE prophylaxis - Lovenox DIET DYS 2 Room service appropriate? Yes; Fluid consistency: Nectar Thick  aspirin 81 mg daily and warfarin daily prior to admission, now on aspirin 300 mg suppository daily. Discussed with Dr. Cyndia Bent and agree with resumption of coumadin. INR goal 2-3. Bridge with ASA.   Patient counseled to be compliant with her antithrombotic medications  Ongoing aggressive stroke risk factor  management  Therapy recommendations:  pending  Disposition: Pending  Afib  On coumadin PTA  S/p MAZE  Currently off AC due to procedure  CT repeat showed no acute stroke, agree with resumption of coumadin. INR goal 2-3. Bridge with ASA.  CAD s/p CABG  Stable  CTVS on board  Anemia  Hb 6.6 -> 9.1 / 27.1 after transfusion.  CBC monitoring  Hypertension  Occasional mildly low blood pressures - 107/73  BP 93/80 around the time of stroke  Permissive hypertension (OK if < 180/105) but gradually normalize in 5-7 days  Long-term BP goal normotensive  Hyperlipidemia  Home meds:  Crestor 20 mg daily resumed in hospital  LDL 89, goal < 70  Continue statin at discharge  Diabetes  HgbA1c 7.6, goal < 7.0  Uncontrolled  SSI  CBG monitoring  Other Stroke Risk Factors  Advanced age  Over weight, Body mass index is 28 kg/m., recommend weight loss, diet and exercise as appropriate   Coronary artery disease   Other Active Problems  AKI - BUN 25 ; creatinine 1.63  Leukocytosis - 17.2 - afebrile  - steroids - (Zinacef - 4 doses surgical prophylaxis) -> 15.2  Hospital day # 6  Neurology will sign off. Please call with questions. Pt will follow up with Dr. Erlinda Hong at [facility] in about 6 weeks. Thanks for the consult.   Rosalin Hawking, MD PhD Stroke Neurology 12/01/2016 11:34 AM  To contact Stroke Continuity provider, please refer to [email or web address] After hours, contact General Neurology

## 2016-12-01 NOTE — Progress Notes (Signed)
3 Days Post-Op Procedure(s) (LRB): CORONARY ARTERY BYPASS GRAFTING (CABG) x 4 (N/A) MAZE (N/A) TRANSESOPHAGEAL ECHOCARDIOGRAM (TEE) (N/A) ENDOVEIN HARVEST OF GREATER SAPHENOUS VEIN (Right) Subjective:  No complaints  More alert and conversant  Able to stand to get to chair  Objective: Vital signs in last 24 hours: Temp:  [98.4 F (36.9 C)-99.5 F (37.5 C)] 98.4 F (36.9 C) (01/14 0844) Pulse Rate:  [75-87] 82 (01/14 1000) Cardiac Rhythm: Normal sinus rhythm (01/14 1000) Resp:  [13-24] 23 (01/14 1000) BP: (122-161)/(61-89) 122/69 (01/14 1000) SpO2:  [87 %-100 %] 100 % (01/14 1000) Weight:  [74 kg (163 lb 2.3 oz)] 74 kg (163 lb 2.3 oz) (01/14 0500)  Hemodynamic parameters for last 24 hours:    Intake/Output from previous day: 01/13 0701 - 01/14 0700 In: 2578.8 [P.O.:200; I.V.:1197.5; Blood:1131.3; IV Piggyback:50] Out: [id number] Intake/Output this shift: Total I/O In: 300 [I.V.:300] Out: 150 [Urine:150]  General appearance: alert and cooperative Neurologic: expressive aphasia Heart: regular rate and rhythm, S1, S2 normal, no murmur, click, rub or gallop Lungs: diminished breath sounds bibasilar Extremities: edema mild Wound: incision ok  Lab Results:  Recent Labs  11/30/16 0456 12/01/16 0226  WBC 17.2* 15.2*  HGB 6.6* 9.1*  HCT 20.5* 27.1*  PLT 124* 120*   BMET:  Recent Labs  11/30/16 0456 12/01/16 0226  NA 138 141  K 4.3 4.7  CL 108 109  CO2 24 25  GLUCOSE 85 75  BUN 25* 22*  CREATININE 1.63* 1.51*  CALCIUM 8.8* 8.6*    PT/INR:  Recent Labs  11/30/16 0456  LABPROT 15.5*  INR 1.23   ABG    Component Value Date/Time   PHART 7.378 11/29/2016 0707   HCO3 22.2 11/29/2016 0707   TCO2 24 11/29/2016 1730   ACIDBASEDEF 3.0 (H) 11/29/2016 0707   O2SAT 98.0 11/29/2016 0707   CBG (last 3)   Recent Labs  11/30/16 2337 12/01/16 0419 12/01/16 0837  GLUCAP 112* 73 72    Assessment/Plan: S/P Procedure(s) (LRB): CORONARY ARTERY  BYPASS GRAFTING (CABG) x 4 (N/A) MAZE (N/A) TRANSESOPHAGEAL ECHOCARDIOGRAM (TEE) (N/A) ENDOVEIN HARVEST OF GREATER SAPHENOUS VEIN (Right)  She is hemodynamically stable in sinus rhythm  Acute postop blood loss anemia: Hgb improved with transfusion  Chronic kidney disease: creat stable below baseline  Volume excess: wt is 10 lbs over preop. Will give her a dose of lasix today.  Postop left brain stroke with initial right hemiparesis improved and expressive aphasia improved. Etiology unclear. Repeat head CT today shows no CT evidence of acute infarct. Seen by neurology. Will continue aspirin and start coumadin today for MAZE and hx of PAF.  Dysphagia: continue Dys 2 diet per speech therapy.  PT and OT consult  Encourage IS and get flutter valve.  DM: preop Hgb A1c was 7.6 but glucose has been under tight control with SSI.   LOS: 6 days    Audrey Mullen 12/01/2016

## 2016-12-01 NOTE — Progress Notes (Signed)
**  Preliminary report by tech**  Bilateral lower extremity venous duplex completed. There is no evidence of deep or superficial vein thrombosis involving the right and left lower extremities. All visualized vessels appear patent and compressible. There is no evidence of Baker's cysts bilaterally.  12/01/16 3:43 PM Audrey Mullen RVT

## 2016-12-01 NOTE — Progress Notes (Signed)
Patient ID: Audrey Mullen, female   DOB: [birth year], 76 y.o.   MRN: [id number]  SICU Evening Rounds:  Hemodynamically stable today.  Taking meds in apple sauce  OOB to chair. Strength improving.

## 2016-12-02 ENCOUNTER — Inpatient Hospital Stay (HOSPITAL_COMMUNITY): Payer: Medicare Other

## 2016-12-02 DIAGNOSIS — Z951 Presence of aortocoronary bypass graft: Secondary | ICD-10-CM

## 2016-12-02 DIAGNOSIS — R9439 Abnormal result of other cardiovascular function study: Secondary | ICD-10-CM

## 2016-12-02 LAB — CBC
HEMATOCRIT: 28.8 % — AB (ref 36.0–46.0)
HEMOGLOBIN: 9.3 g/dL — AB (ref 12.0–15.0)
MCH: 28.2 pg (ref 26.0–34.0)
MCHC: 32.3 g/dL (ref 30.0–36.0)
MCV: 87.3 fL (ref 78.0–100.0)
Platelets: 153 10*3/uL (ref 150–400)
RBC: 3.3 MIL/uL — AB (ref 3.87–5.11)
RDW: 16.1 % — ABNORMAL HIGH (ref 11.5–15.5)
WBC: 12.9 10*3/uL — ABNORMAL HIGH (ref 4.0–10.5)

## 2016-12-02 LAB — GLUCOSE, CAPILLARY
GLUCOSE-CAPILLARY: 141 mg/dL — AB (ref 65–99)
GLUCOSE-CAPILLARY: 151 mg/dL — AB (ref 65–99)
GLUCOSE-CAPILLARY: 162 mg/dL — AB (ref 65–99)
Glucose-Capillary: 99 mg/dL (ref 65–99)
Glucose-Capillary: 133 mg/dL — ABNORMAL HIGH (ref 65–99)
Glucose-Capillary: 170 mg/dL — ABNORMAL HIGH (ref 65–99)

## 2016-12-02 LAB — BASIC METABOLIC PANEL
Anion gap: 11 (ref 5–15)
BUN: 22 mg/dL — AB (ref 6–20)
CHLORIDE: 109 mmol/L (ref 101–111)
CO2: 21 mmol/L — AB (ref 22–32)
Calcium: 8.6 mg/dL — ABNORMAL LOW (ref 8.9–10.3)
Creatinine, Ser: 1.13 mg/dL — ABNORMAL HIGH (ref 0.44–1.00)
GFR calc Af Amer: 53 mL/min — ABNORMAL LOW (ref >60)
GFR calc non Af Amer: 46 mL/min — ABNORMAL LOW (ref >60)
Glucose, Bld: 103 mg/dL — ABNORMAL HIGH (ref 65–99)
POTASSIUM: 4 mmol/L (ref 3.5–5.1)
SODIUM: 141 mmol/L (ref 135–145)

## 2016-12-02 LAB — PROTIME-INR
INR: 0.98
PROTHROMBIN TIME: 13 s (ref 11.4–15.2)

## 2016-12-02 MED ORDER — INSULIN ASPART 100 UNIT/ML ~~LOC~~ SOLN
0.0000 [IU] | Freq: Three times a day (TID) | SUBCUTANEOUS | Status: DC
  Administered 2016-12-02: 2 [IU] via SUBCUTANEOUS
  Administered 2016-12-02 (×2): 3 [IU] via SUBCUTANEOUS
  Administered 2016-12-03 – 2016-12-04 (×4): 2 [IU] via SUBCUTANEOUS
  Administered 2016-12-04 – 2016-12-05 (×3): 3 [IU] via SUBCUTANEOUS
  Administered 2016-12-05: 5 [IU] via SUBCUTANEOUS
  Administered 2016-12-05: 2 [IU] via SUBCUTANEOUS
  Administered 2016-12-06 (×2): 3 [IU] via SUBCUTANEOUS
  Administered 2016-12-08: 2 [IU] via SUBCUTANEOUS
  Administered 2016-12-08: 3 [IU] via SUBCUTANEOUS
  Administered 2016-12-08: 2 [IU] via SUBCUTANEOUS
  Administered 2016-12-09: 3 [IU] via SUBCUTANEOUS

## 2016-12-02 MED ORDER — FAMOTIDINE 20 MG PO TABS
20.0000 mg | ORAL_TABLET | Freq: Every day | ORAL | Status: DC
  Administered 2016-12-03 – 2016-12-09 (×7): 20 mg via ORAL
  Filled 2016-12-02 (×7): qty 1

## 2016-12-02 MED ORDER — WARFARIN SODIUM 7.5 MG PO TABS
7.5000 mg | ORAL_TABLET | Freq: Once | ORAL | Status: AC
  Administered 2016-12-02: 7.5 mg via ORAL
  Filled 2016-12-02: qty 1

## 2016-12-02 NOTE — Care Management Note (Signed)
Case Management Note  Patient Details  Name: Audrey Mullen MRN: [id number] Date of Birth: Apr 05, 1941  Subjective/Objective:   Pt is s/p CABG                 Action/Plan:  PTA independent from home with husband - husband confirmed that he will be with pt post discharge for 24/7.  Choice given for [facility] and DME - [facility] chosen for both.  CM requested HH/DME order via physician sticky note tab.  CM will continue to follow for discharge needs   Expected Discharge Date:                  Expected Discharge Plan:  [place]  In-House Referral:     Discharge planning Services  CM Consult  Post Acute Care Choice:    Choice offered to:     DME Arranged:    DME Agency:     HH Arranged:    HH Agency:     Status of Service:  In process, will continue to follow  If discussed at Long Length of Stay Meetings, dates discussed:    Additional Comments:  Maryclare Labrador, RN 12/02/2016, 2:27 PM

## 2016-12-02 NOTE — Progress Notes (Signed)
Inpatient Rehabilitation  Speech therapist has recommended IP Rehab.  PT/OT  have recommended home health follow up .  At this time, we are not recommending an IP Rehab consult as pt. currently is functionally too high level.    [place] Admissions Coordinator Cell [phone] Office [phone]

## 2016-12-02 NOTE — Evaluation (Addendum)
Physical Therapy Evaluation Patient Details Name: Audrey Mullen MRN: [id number] DOB: 1940-12-12 Today's Date: 12/02/2016   History of Present Illness  76 yo admitted for CABG with aphasia and Right weakness post op CT (-). PMHx: Afib, HTN, HLD, DM, nephrolithiasis  Clinical Impression  Pt very pleasant and moving well with noted expressive aphasia. No weakness or focal deficit of bil LE. Pt with decreased transfers, gait, and function who will benefit from acute therapy to maximize mobility, independence and function to decrease burden of care. Pt educated for sternal precautions, transfers and gait. Encouraged mobility with nursing assist.   HR 86-94 BP 133/76 pre 149/85 post 98% on RA    Follow Up Recommendations Home health PT    Equipment Recommendations  Rolling walker with 5" wheels    Recommendations for Other Services       Precautions / Restrictions Precautions Precautions: Fall;Sternal      Mobility  Bed Mobility               General bed mobility comments: in chair on arrival  Transfers Overall transfer level: Needs assistance   Transfers: Sit to/from Stand Sit to Stand: Min guard         General transfer comment: cues for hand placement, safety and scooting to edge  Ambulation/Gait Ambulation/Gait assistance: Min assist Ambulation Distance (Feet): 200 Feet Assistive device: Rolling walker (2 wheeled) Gait Pattern/deviations: Step-through pattern;Decreased stride length;Trunk flexed   Gait velocity interpretation: Below normal speed for age/gender General Gait Details: cues for posture and position in RW  Stairs            Wheelchair Mobility    Modified Rankin (Stroke Patients Only) Modified Rankin (Stroke Patients Only) Pre-Morbid Rankin Score: No symptoms Modified Rankin: No significant disability     Balance Overall balance assessment: Needs assistance   Sitting balance-Leahy Scale: Good       Standing  balance-Leahy Scale: Good                               Pertinent Vitals/Pain Pain Assessment: No/denies pain    Home Living Family/patient expects to be discharged to:: Private residence Living Arrangements: Spouse/significant other;Children Available Help at Discharge: Family;Available 24 hours/day Type of Home: House Home Access: Stairs to enter Entrance Stairs-Rails: Right Entrance Stairs-Number of Steps: 5 Home Layout: Multi-level Home Equipment: None      Prior Function Level of Independence: Independent         Comments: pt does the housework, cooking and drives     Hand Dominance        Extremity/Trunk Assessment   Upper Extremity Assessment Upper Extremity Assessment: Defer to OT evaluation    Lower Extremity Assessment Lower Extremity Assessment: Overall WFL for tasks assessed    Cervical / Trunk Assessment Cervical / Trunk Assessment: Kyphotic  Communication   Communication: Expressive difficulties  Cognition Arousal/Alertness: Awake/alert Behavior During Therapy: WFL for tasks assessed/performed Overall Cognitive Status: Within Functional Limits for tasks assessed                      General Comments      Exercises     Assessment/Plan    PT Assessment Patient needs continued PT services  PT Problem List Decreased strength;Decreased mobility;Decreased activity tolerance;Decreased knowledge of precautions;Decreased balance          PT Treatment Interventions Gait training;Stair training;Functional mobility training;Therapeutic exercise;Patient/family education;DME instruction;Therapeutic activities  PT Goals (Current goals can be found in the Care Plan section)  Acute Rehab PT Goals Patient Stated Goal: return home, sing at church PT Goal Formulation: With patient Time For Goal Achievement: 12/16/16 Potential to Achieve Goals: Good    Frequency Min 3X/week   Barriers to discharge        Co-evaluation  PT/OT/SLP Co-Evaluation/Treatment: Yes Reason for Co-Treatment: Complexity of the patient's impairments (multi-system involvement) PT goals addressed during session: Mobility/safety with mobility;Proper use of DME         End of Session Equipment Utilized During Treatment: Gait belt Activity Tolerance: Patient tolerated treatment well Patient left: in chair;with call bell/phone within reach Nurse Communication: Mobility status;Precautions         Time: [id number] PT Time Calculation (min) (ACUTE ONLY): 21 min   Charges:   PT Evaluation $PT Eval Moderate Complexity: 1 Procedure     PT G Codes:        Alasha Mcguinness B Larenda Reedy Dec 22, 2016, 8:45 AM  Elwyn Reach, [place]

## 2016-12-02 NOTE — Progress Notes (Signed)
4 Days Post-Op Procedure(s) (LRB): CORONARY ARTERY BYPASS GRAFTING (CABG) x 4 (N/A) MAZE (N/A) TRANSESOPHAGEAL ECHOCARDIOGRAM (TEE) (N/A) ENDOVEIN HARVEST OF GREATER SAPHENOUS VEIN (Right) Subjective: Expressive aphasia persists  Objective: Vital signs in last 24 hours: Temp:  [98.2 F (36.8 C)-99 F (37.2 C)] 98.7 F (37.1 C) (01/15 1154) Pulse Rate:  [78-93] 81 (01/15 1100) Cardiac Rhythm: Normal sinus rhythm (01/15 0800) Resp:  [15-23] 18 (01/15 1100) BP: (112-151)/(61-111) 135/69 (01/15 1100) SpO2:  [93 %-100 %] 94 % (01/15 1100) Weight:  [162 lb 4.1 oz (73.6 kg)] 162 lb 4.1 oz (73.6 kg) (01/15 0500)  Hemodynamic parameters for last 24 hours:    Intake/Output from previous day: 01/14 0701 - 01/15 0700 In: 2075 [P.O.:200; I.V.:1875] Out: 1335 [Urine:1335] Intake/Output this shift: Total I/O In: 520 [P.O.:120; I.V.:300; IV Piggyback:100] Out: 180 [Urine:180]  General appearance: alert, cooperative and no distress Neurologic: good strength bilaterally, expressive aphasia Heart: regular rate and rhythm Lungs: diminished breath sounds bibasilar Wound: clean and dry  Lab Results:  Recent Labs  12/01/16 0226 12/02/16 0229  WBC 15.2* 12.9*  HGB 9.1* 9.3*  HCT 27.1* 28.8*  PLT 120* 153   BMET:  Recent Labs  12/01/16 0226 12/02/16 0229  NA 141 141  K 4.7 4.0  CL 109 109  CO2 25 21*  GLUCOSE 75 103*  BUN 22* 22*  CREATININE 1.51* 1.13*  CALCIUM 8.6* 8.6*    PT/INR:  Recent Labs  12/02/16 0229  LABPROT 13.0  INR 0.98   ABG    Component Value Date/Time   PHART 7.378 11/29/2016 0707   HCO3 22.2 11/29/2016 0707   TCO2 24 11/29/2016 1730   ACIDBASEDEF 3.0 (H) 11/29/2016 0707   O2SAT 98.0 11/29/2016 0707   CBG (last 3)   Recent Labs  12/02/16 0403 12/02/16 0844 12/02/16 1152  GLUCAP 99 133* 170*    Assessment/Plan: S/P Procedure(s) (LRB): CORONARY ARTERY BYPASS GRAFTING (CABG) x 4 (N/A) MAZE (N/A) TRANSESOPHAGEAL ECHOCARDIOGRAM (TEE)  (N/A) ENDOVEIN HARVEST OF GREATER SAPHENOUS VEIN (Right) 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

## 2016-12-02 NOTE — Progress Notes (Signed)
Patient ID: Audrey Mullen, female   DOB: [birth year], 76 y.o.   MRN: [id number] EVENING ROUNDS NOTE :     [address]       [place] [address]             [phone]                 4 Days Post-Op Procedure(s) (LRB): CORONARY ARTERY BYPASS GRAFTING (CABG) x 4 (N/A) MAZE (N/A) TRANSESOPHAGEAL ECHOCARDIOGRAM (TEE) (N/A) ENDOVEIN HARVEST OF GREATER SAPHENOUS VEIN (Right)  Total Length of Stay:  LOS: 7 days  BP 137/70   Pulse 83   Temp 99.1 F (37.3 C) (Oral)   Resp 16   Ht 5\' 4"  (1.626 m)   Wt 162 lb 4.1 oz (73.6 kg)   SpO2 95%   BMI 27.85 kg/m   .Intake/Output      01/14 0701 - 01/15 0700 01/15 0701 - 01/16 0700   P.O. 200 480   I.V. (mL/kg) 1875 (25.5) 868.8 (11.8)   Blood     IV Piggyback  100   Total Intake(mL/kg) 2075 (28.2) 1448.8 (19.7)   Urine (mL/kg/hr) 1335 (0.8) 510 (0.6)   Total Output 1335 510   Net +740 +938.8          . sodium chloride Stopped (12/02/16 1835)     Lab Results  Component Value Date   WBC 12.9 (H) 12/02/2016   HGB 9.3 (L) 12/02/2016   HCT 28.8 (L) 12/02/2016   PLT 153 12/02/2016   GLUCOSE 103 (H) 12/02/2016   CHOL 166 11/27/2016   TRIG 181 (H) 11/27/2016   HDL 41 11/27/2016   LDLCALC 89 11/27/2016   ALT 32 11/27/2016   AST 24 11/27/2016   NA 141 12/02/2016   K 4.0 12/02/2016   CL 109 12/02/2016   CREATININE 1.13 (H) 12/02/2016   BUN 22 (H) 12/02/2016   CO2 21 (L) 12/02/2016   TSH 0.40 11/07/2016   INR 0.98 12/02/2016   HGBA1C 7.6 (H) 11/28/2016   Walked around unit, still with trouble speaking    Grace Isaac MD  Beeper [phone] Office [phone] 12/02/2016 6:36 PM

## 2016-12-02 NOTE — Progress Notes (Signed)
Speech Language Pathology Treatment: Dysphagia  Patient Details Name: Audrey Mullen MRN: [id number] DOB: Apr 02, 1941 Today's Date: 12/02/2016 Time: [id number] SLP Time Calculation (min) (ACUTE ONLY): 16 min  Assessment / Plan / Recommendation Clinical Impression  Patient upright in chair with breakfast tray of dysphagia 2/ nectar-thick liquids. Observed with trials of mechanical chopped solids; patient displayed prolonged mastication with retention in R buccal cavity, immediate throat clear following solid; suspect R posterior loss of bolus. Left head tilt during mastication improves oral control, and patient implements finger sweep of oral cavity, multiple dry swallows, and liquid wash with moderate verbal cues. These strategies reduce but do not fully clear oral residue, however in consultation with pt she acknowledges difficulty managing dysphagia 2 texture. She states, "afraid I might choke." She manages puree quite well. Recommend downgrading to dysphagia 1, nectar-thick liquids. She may benefit from exercises to improve oral function, training in compensatory strategies with upgraded PO trials. SLP will continue to follow.   HPI HPI: Pt is a 76 y.o. female with PMH of HTN, HLD, DM, nephrolithiasis, and PAF who presented for exertional chest tightness with SOB. Pt had CABG surgery on 1/11. Pt was intubated for procedure, extubated 1/12. On 1/13 pt had sudden onset RUE weakness, R facial droop and aphasia. 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 CXR showed stable dense LLL atelectasis, PNA or aspiration pneumonitis. Bedside swallow eval ordered.      SLP Plan  Continue with current plan of care     Recommendations  Diet recommendations: Dysphagia 1 (puree);Nectar-thick liquid Liquids provided via: Cup;Straw Medication Administration: Crushed with puree Supervision: Full supervision/cueing for compensatory  strategies Compensations: Minimize environmental distractions;Slow rate;Small sips/bites;Monitor for anterior loss;Multiple dry swallows after each bite/sip (finger sweep to clear residue) Postural Changes and/or Swallow Maneuvers: Seated upright 90 degrees (head tilt left during mastication)                Oral Care Recommendations: Oral care BID Follow up Recommendations: Inpatient Rehab Plan: Continue with current plan of care   Deneise Lever, [place] CF-SLP Speech-Language Pathologist [phone]    [place] 12/02/2016, 9:20 AM

## 2016-12-02 NOTE — Evaluation (Signed)
Occupational Therapy Evaluation Patient Details Name: Audrey Mullen MRN: [id number] DOB: [birth year] Today's Date: 12/02/2016    History of Present Illness 76 yo admitted for CABG with aphasia and Right weakness post op CT (-). PMHx: Afib, HTN, HLD, DM, nephrolithiasis   Clinical Impression   Pt reports she was independent with ADL PTA. Currently pt overall min assist for ADL and functional mobility. Educated pt on sternal precautions and maintaining during functional activities. Pt planning to d/c home with 24/7 supervision from family. Recommending HHOT for follow up to maximize independence and safety with ADL and functional mobility upon return home. Pt would benefit from continued skilled OT to address established goals.    Follow Up Recommendations  Home health OT;Supervision/Assistance - 24 hour    Equipment Recommendations  None recommended by OT    Recommendations for Other Services       Precautions / Restrictions Precautions Precautions: Fall;Sternal Precaution Comments: Educated pt on sternal precautions Restrictions Weight Bearing Restrictions: Yes Other Position/Activity Restrictions: Sternal precautions      Mobility Bed Mobility               General bed mobility comments: in chair on arrival  Transfers Overall transfer level: Needs assistance Equipment used: Rolling walker (2 wheeled) Transfers: Sit to/from Stand Sit to Stand: Min guard         General transfer comment: cues for hand placement, safety and scooting to edge    Balance Overall balance assessment: Needs assistance Sitting-balance support: Feet supported;No upper extremity supported Sitting balance-Leahy Scale: Good     Standing balance support: No upper extremity supported;During functional activity Standing balance-Leahy Scale: Good                              ADL Overall ADL's : Needs assistance/impaired Eating/Feeding: Set up;Sitting   Grooming:  Minimal assistance;Standing;Brushing hair;Oral care;Wash/dry face;Adhering to UE precautions Grooming Details (indicate cue type and reason): Min assist for standing balance Upper Body Bathing: Minimal assistance;Sitting   Lower Body Bathing: Minimal assistance;Sit to/from stand   Upper Body Dressing : Minimal assistance;Sitting   Lower Body Dressing: Moderate assistance;Sit to/from stand   Toilet Transfer: Minimal assistance;Ambulation;RW Toilet Transfer Details (indicate cue type and reason): Simulated by sit to stand from chair with functional mobility in room         Functional mobility during ADLs: Minimal assistance;Rolling walker General ADL Comments: Educated pt on sternal precautions during functional activities; min cues to maintain precuations during functional activities.     Vision Additional Comments: Appears WFL.   Perception     Praxis      Pertinent Vitals/Pain Pain Assessment: No/denies pain     Hand Dominance Right   Extremity/Trunk Assessment Upper Extremity Assessment Upper Extremity Assessment: Defer to OT evaluation   Lower Extremity Assessment Lower Extremity Assessment: Overall WFL for tasks assessed   Cervical / Trunk Assessment Cervical / Trunk Assessment: Kyphotic   Communication Communication Communication: Expressive difficulties   Cognition Arousal/Alertness: Awake/alert Behavior During Therapy: WFL for tasks assessed/performed Overall Cognitive Status: Within Functional Limits for tasks assessed                     General Comments       Exercises       Shoulder Instructions      Home Living Family/patient expects to be discharged to:: Private residence Living Arrangements: Spouse/significant other;Children Available Help at Discharge: Family;Available  24 hours/day Type of Home: House Home Access: Stairs to enter [employer] of Steps: 5 Entrance Stairs-Rails: Right Home Layout: Multi-level Alternate  Level Stairs-Number of Steps: 5   Bathroom Shower/Tub: Occupational psychologist: Standard     Home Equipment: None          Prior Functioning/Environment Level of Independence: Independent        Comments: pt does the housework, cooking and drives        OT Problem List: Decreased strength;Decreased activity tolerance;Impaired balance (sitting and/or standing);Decreased knowledge of use of DME or AE;Decreased knowledge of precautions   OT Treatment/Interventions: Self-care/ADL training;Energy conservation;DME and/or AE instruction;Therapeutic activities;Patient/family education;Balance training    OT Goals(Current goals can be found in the care plan section) Acute Rehab OT Goals Patient Stated Goal: return home, sing at church OT Goal Formulation: With patient Time For Goal Achievement: 12/16/16 Potential to Achieve Goals: Good ADL Goals Pt Will Perform Grooming: with supervision;standing Pt Will Perform Upper Body Bathing: with supervision;sitting Pt Will Perform Lower Body Bathing: with supervision;sit to/from stand Pt Will Perform Upper Body Dressing: with supervision;sitting Pt Will Perform Lower Body Dressing: with supervision;sit to/from stand Pt Will Transfer to Toilet: with supervision;ambulating;regular height toilet Pt Will Perform Toileting - Clothing Manipulation and hygiene: with supervision;sit to/from stand Additional ADL Goal #1: Pt will independently verbally recall sternal precautions and maintain throughout ADL.  OT Frequency: Min 2X/week   Barriers to D/C:            Co-evaluation PT/OT/SLP Co-Evaluation/Treatment: Yes Reason for Co-Treatment: Complexity of the patient's impairments (multi-system involvement) PT goals addressed during session: Mobility/safety with mobility;Proper use of DME OT goals addressed during session: ADL's and self-care      End of Session Equipment Utilized During Treatment: Gait belt;Rolling walker Nurse  Communication: Mobility status  Activity Tolerance: Patient tolerated treatment well Patient left: in chair;with call bell/phone within reach   Time: [id number] OT Time Calculation (min): 21 min Charges:  OT General Charges $OT Visit: 1 Procedure OT Evaluation $OT Eval Moderate Complexity: 1 Procedure G-Codes:     Binnie Kand M.S., OTR/L Pager: [phone]  12/02/2016, 8:48 AM

## 2016-12-03 LAB — BASIC METABOLIC PANEL
ANION GAP: 7 (ref 5–15)
BUN: 14 mg/dL (ref 6–20)
CALCIUM: 8.6 mg/dL — AB (ref 8.9–10.3)
CO2: 24 mmol/L (ref 22–32)
CREATININE: 1 mg/dL (ref 0.44–1.00)
Chloride: 109 mmol/L (ref 101–111)
GFR calc Af Amer: >60 mL/min (ref >60)
GFR calc non Af Amer: 53 mL/min — ABNORMAL LOW (ref >60)
GLUCOSE: 111 mg/dL — AB (ref 65–99)
Potassium: 3.6 mmol/L (ref 3.5–5.1)
Sodium: 140 mmol/L (ref 135–145)

## 2016-12-03 LAB — GLUCOSE, CAPILLARY
GLUCOSE-CAPILLARY: 139 mg/dL — AB (ref 65–99)
GLUCOSE-CAPILLARY: 161 mg/dL — AB (ref 65–99)
Glucose-Capillary: 127 mg/dL — ABNORMAL HIGH (ref 65–99)
Glucose-Capillary: 147 mg/dL — ABNORMAL HIGH (ref 65–99)

## 2016-12-03 LAB — CBC
HCT: 28.9 % — ABNORMAL LOW (ref 36.0–46.0)
Hemoglobin: 9.5 g/dL — ABNORMAL LOW (ref 12.0–15.0)
MCH: 28.3 pg (ref 26.0–34.0)
MCHC: 32.9 g/dL (ref 30.0–36.0)
MCV: 86 fL (ref 78.0–100.0)
PLATELETS: 197 10*3/uL (ref 150–400)
RBC: 3.36 MIL/uL — ABNORMAL LOW (ref 3.87–5.11)
RDW: 15.9 % — AB (ref 11.5–15.5)
WBC: 10.1 10*3/uL (ref 4.0–10.5)

## 2016-12-03 LAB — PROTIME-INR
INR: 1.01
Prothrombin Time: 13.3 seconds (ref 11.4–15.2)

## 2016-12-03 MED ORDER — ALUM & MAG HYDROXIDE-SIMETH 200-200-20 MG/5ML PO SUSP: 15.0000 mL | ORAL | Status: DC | PRN

## 2016-12-03 MED ORDER — TRAMADOL HCL 50 MG PO TABS: 50.0000 mg | ORAL_TABLET | Freq: Four times a day (QID) | ORAL | Status: DC | PRN

## 2016-12-03 MED ORDER — WARFARIN SODIUM 7.5 MG PO TABS
7.5000 mg | ORAL_TABLET | Freq: Once | ORAL | Status: AC
  Administered 2016-12-03: 7.5 mg via ORAL
  Filled 2016-12-03: qty 1

## 2016-12-03 MED ORDER — DILTIAZEM HCL ER COATED BEADS 120 MG PO CP24
120.0000 mg | ORAL_CAPSULE | Freq: Every day | ORAL | Status: DC
  Administered 2016-12-03 – 2016-12-09 (×7): 120 mg via ORAL
  Filled 2016-12-03 (×7): qty 1

## 2016-12-03 MED ORDER — SODIUM CHLORIDE 0.9% FLUSH: 3.0000 mL | INTRAVENOUS | Status: DC | PRN

## 2016-12-03 MED ORDER — MOVING RIGHT ALONG BOOK
Freq: Once | Status: DC
  Filled 2016-12-03 (×2): qty 1

## 2016-12-03 MED ORDER — SODIUM CHLORIDE 0.9 % IV SOLN: 250.0000 mL | INTRAVENOUS | Status: DC | PRN

## 2016-12-03 MED ORDER — ENOXAPARIN SODIUM 40 MG/0.4ML ~~LOC~~ SOLN
40.0000 mg | Freq: Every day | SUBCUTANEOUS | Status: DC
  Administered 2016-12-03 – 2016-12-08 (×6): 40 mg via SUBCUTANEOUS
  Filled 2016-12-03 (×6): qty 0.4

## 2016-12-03 MED ORDER — SODIUM CHLORIDE 0.9% FLUSH
3.0000 mL | Freq: Two times a day (BID) | INTRAVENOUS | Status: DC
  Administered 2016-12-03 – 2016-12-09 (×9): 3 mL via INTRAVENOUS

## 2016-12-03 MED ORDER — MAGNESIUM HYDROXIDE 400 MG/5ML PO SUSP: 30.0000 mL | Freq: Every day | ORAL | Status: DC | PRN

## 2016-12-03 MED ORDER — POTASSIUM CHLORIDE CRYS ER 20 MEQ PO TBCR
20.0000 meq | EXTENDED_RELEASE_TABLET | Freq: Three times a day (TID) | ORAL | Status: AC
  Administered 2016-12-03 (×3): 20 meq via ORAL
  Filled 2016-12-03 (×3): qty 1

## 2016-12-03 MED FILL — Heparin Sodium (Porcine) Inj 1000 Unit/ML: INTRAMUSCULAR | Qty: 10 | Status: AC

## 2016-12-03 MED FILL — Nitroglycerin IV Soln 100 MCG/ML in D5W: INTRA_ARTERIAL | Qty: 10 | Status: AC

## 2016-12-03 NOTE — Progress Notes (Signed)
Physical Therapy Treatment Patient Details Name: Audrey Mullen MRN: [id number] DOB: 1941-08-26 Today's Date: December 10, 2016    History of Present Illness 76 yo admitted for CABG with expressive aphasia and Right weakness post op CT (-). PMHx: Afib, HTN, HLD, DM, nephrolithiasis    PT Comments    Pt remains pleasant, agreeable to mobility and limited by expressive aphasia. Pt with increased gait tolerance today but not yet ready to attempt stairs. Pt educated for precautions, HEP, transfers and activity progression.   HR 88-107 BP pre 148/81 Post 140/75 sats 95% RA  Follow Up Recommendations  Home health PT     Equipment Recommendations  Rolling walker with 5" wheels    Recommendations for Other Services       Precautions / Restrictions Precautions Precautions: Fall;Sternal Precaution Comments: pt able to recall 1/5 precautions    Mobility  Bed Mobility               General bed mobility comments: in chair on arrival  Transfers Overall transfer level: Needs assistance   Transfers: Sit to/from Stand Sit to Stand: Min guard         General transfer comment: cues for hand placement, safety and scooting to edge  Ambulation/Gait Ambulation/Gait assistance: Min guard Ambulation Distance (Feet): 400 Feet Assistive device: Rolling walker (2 wheeled) Gait Pattern/deviations: Step-through pattern;Decreased stride length;Trunk flexed   Gait velocity interpretation: Below normal speed for age/gender General Gait Details: cues for posture and position in RW. pt fatigued end of gait and unable to attempt stairs today   Stairs            Wheelchair Mobility    Modified Rankin (Stroke Patients Only)       Balance                                    Cognition Arousal/Alertness: Awake/alert Behavior During Therapy: WFL for tasks assessed/performed Overall Cognitive Status: Within Functional Limits for tasks assessed                       Exercises General Exercises - Lower Extremity Long Arc Quad: AROM;Both;Seated;15 reps Hip ABduction/ADduction: AROM;15 reps;Both;Seated Hip Flexion/Marching: AROM;15 reps;Both;Seated    General Comments        Pertinent Vitals/Pain Pain Assessment: No/denies pain    Home Living                      Prior Function            PT Goals (current goals can now be found in the care plan section) Progress towards PT goals: Progressing toward goals    Frequency           PT Plan Current plan remains appropriate    Co-evaluation             End of Session   Activity Tolerance: Patient tolerated treatment well Patient left: in chair;with call bell/phone within Mullen;with nursing/sitter in room     Time: 1320-1335 PT Time Calculation (min) (ACUTE ONLY): 15 min  Charges:  $Gait Training: 8-22 mins                    G Codes:      Audrey Mullen 2016-12-10, 1:39 PM Audrey Mullen, [place]

## 2016-12-03 NOTE — Progress Notes (Signed)
Speech Language Pathology Treatment: Dysphagia  Patient Details Name: Audrey Mullen MRN: [id number] DOB: 1941-09-30 Today's Date: 12/03/2016 Time: [id number] SLP Time Calculation (min) (ACUTE ONLY): 15 min  Assessment / Plan / Recommendation Clinical Impression  Skilled treatment session focused on dysphagia goals. SLP facilitated session by providing skilled observation with consumption of ice chips. Pt with no overt s/s of aspiration and had the appearance of quicker swallow initiation. Pt stated that she was full after several trials of ice chips. Would recommend increased trials in next session.    HPI HPI: Pt is a 76 y.o. female with PMH of HTN, HLD, DM, nephrolithiasis, and PAF who presented for exertional chest tightness with SOB. Pt had CABG surgery on 1/11. Pt was intubated for procedure, extubated 1/12. On 1/13 pt had sudden onset RUE weakness, R facial droop and aphasia. 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 CXR showed stable dense LLL atelectasis, PNA or aspiration pneumonitis. Bedside swallow eval ordered.      SLP Plan  Continue with current plan of care     Recommendations  Diet recommendations: Dysphagia 1 (puree);Nectar-thick liquid Liquids provided via: Cup;Straw Medication Administration: Crushed with puree Supervision: Full supervision/cueing for compensatory strategies Compensations: Minimize environmental distractions;Slow rate;Small sips/bites;Monitor for anterior loss;Multiple dry swallows after each bite/sip Postural Changes and/or Swallow Maneuvers: Seated upright 90 degrees                Oral Care Recommendations: Oral care BID Follow up Recommendations: Inpatient Rehab Plan: Continue with current plan of care       GO             Audrey Mullen B. Rutherford Nail, M.S., CCC-SLP Speech-Language Pathologist    Audrey Mullen 12/03/2016, 2:10 PM

## 2016-12-03 NOTE — Progress Notes (Signed)
5 Days Post-Op Procedure(s) (LRB): CORONARY ARTERY BYPASS GRAFTING (CABG) x 4 (N/A) MAZE (N/A) TRANSESOPHAGEAL ECHOCARDIOGRAM (TEE) (N/A) ENDOVEIN HARVEST OF GREATER SAPHENOUS VEIN (Right) Subjective: No complaints, denies pain Expressive aphasia persists  Objective: Vital signs in last 24 hours: Temp:  [98.7 F (37.1 C)-99.1 F (37.3 C)] 98.8 F (37.1 C) (01/16 0400) Pulse Rate:  [72-93] 78 (01/16 0700) Cardiac Rhythm: Normal sinus rhythm (01/16 0715) Resp:  [14-25] 19 (01/16 0700) BP: (129-164)/(63-89) 129/65 (01/16 0700) SpO2:  [91 %-100 %] 95 % (01/16 0700) Weight:  [162 lb 4.1 oz (73.6 kg)] 162 lb 4.1 oz (73.6 kg) (01/16 0500)  Hemodynamic parameters for last 24 hours:    Intake/Output from previous day: 01/15 0701 - 01/16 0700 In: 1494.8 [P.O.:510; I.V.:884.8; IV Piggyback:100] Out: 1390 [Urine:1390] Intake/Output this shift: No intake/output data recorded.  General appearance: alert, cooperative and no distress Neurologic: expressive aphasia Heart: regular rate and rhythm Lungs: clear to auscultation bilaterally Wound: clean and dry  Lab Results:  Recent Labs  12/02/16 0229 12/03/16 0259  WBC 12.9* 10.1  HGB 9.3* 9.5*  HCT 28.8* 28.9*  PLT 153 197   BMET:  Recent Labs  12/02/16 0229 12/03/16 0259  NA 141 140  K 4.0 3.6  CL 109 109  CO2 21* 24  GLUCOSE 103* 111*  BUN 22* 14  CREATININE 1.13* 1.00  CALCIUM 8.6* 8.6*    PT/INR:  Recent Labs  12/03/16 0259  LABPROT 13.3  INR 1.01   ABG    Component Value Date/Time   PHART 7.378 11/29/2016 0707   HCO3 22.2 11/29/2016 0707   TCO2 24 11/29/2016 1730   ACIDBASEDEF 3.0 (H) 11/29/2016 0707   O2SAT 98.0 11/29/2016 0707   CBG (last 3)   Recent Labs  12/02/16 1152 12/02/16 1702 12/02/16 2104  GLUCAP 170* 162* 151*    Assessment/Plan: S/P Procedure(s) (LRB): CORONARY ARTERY BYPASS GRAFTING (CABG) x 4 (N/A) MAZE (N/A) TRANSESOPHAGEAL ECHOCARDIOGRAM (TEE) (N/A) ENDOVEIN HARVEST OF  GREATER SAPHENOUS VEIN (Right) Plan for transfer to step-down: see transfer orders  NEURO- periop CVA with expressive aphasia  Doing well with PT and OT  Working with Speech, D1 diet  CV- maintaining SR,   Coumadin 7.5 mg PO today  RESP- IS for atelectasis  RENAL- creatinine normal, supplement K  ENDO_ CBG moderately elevated  Transfer to 2 west   LOS: 8 days    [place] Nakayama 12/03/2016

## 2016-12-03 NOTE — Significant Event (Signed)
Patient transferred safely to 2W25, taken via wheerchair. VS stable. Family made aware of the transfer. No personal belongings in 2S13 that could be taken to new room. Report given to receiving RN. Patient settled in bed per her requests. Staff in room prior to RN leaving.        Audrey Mullen

## 2016-12-04 ENCOUNTER — Inpatient Hospital Stay (HOSPITAL_COMMUNITY): Payer: Medicare Other

## 2016-12-04 LAB — BASIC METABOLIC PANEL
Anion gap: 6 (ref 5–15)
BUN: 15 mg/dL (ref 6–20)
CALCIUM: 8.9 mg/dL (ref 8.9–10.3)
CO2: 25 mmol/L (ref 22–32)
CREATININE: 1.17 mg/dL — AB (ref 0.44–1.00)
Chloride: 109 mmol/L (ref 101–111)
GFR calc non Af Amer: 44 mL/min — ABNORMAL LOW (ref >60)
GFR, EST AFRICAN AMERICAN: 51 mL/min — AB (ref >60)
GLUCOSE: 135 mg/dL — AB (ref 65–99)
Potassium: 4.8 mmol/L (ref 3.5–5.1)
Sodium: 140 mmol/L (ref 135–145)

## 2016-12-04 LAB — CBC
HEMATOCRIT: 29.7 % — AB (ref 36.0–46.0)
Hemoglobin: 9.5 g/dL — ABNORMAL LOW (ref 12.0–15.0)
MCH: 27.9 pg (ref 26.0–34.0)
MCHC: 32 g/dL (ref 30.0–36.0)
MCV: 87.4 fL (ref 78.0–100.0)
Platelets: 235 10*3/uL (ref 150–400)
RBC: 3.4 MIL/uL — ABNORMAL LOW (ref 3.87–5.11)
RDW: 15.9 % — AB (ref 11.5–15.5)
WBC: 9.7 10*3/uL (ref 4.0–10.5)

## 2016-12-04 LAB — PROTIME-INR
INR: 1.25
Prothrombin Time: 15.8 seconds — ABNORMAL HIGH (ref 11.4–15.2)

## 2016-12-04 LAB — GLUCOSE, CAPILLARY
GLUCOSE-CAPILLARY: 128 mg/dL — AB (ref 65–99)
Glucose-Capillary: 140 mg/dL — ABNORMAL HIGH (ref 65–99)
Glucose-Capillary: 157 mg/dL — ABNORMAL HIGH (ref 65–99)
Glucose-Capillary: 158 mg/dL — ABNORMAL HIGH (ref 65–99)

## 2016-12-04 MED ORDER — WARFARIN SODIUM 7.5 MG PO TABS
7.5000 mg | ORAL_TABLET | Freq: Every day | ORAL | Status: AC
  Administered 2016-12-04: 7.5 mg via ORAL
  Filled 2016-12-04: qty 1

## 2016-12-04 MED ORDER — CARVEDILOL 6.25 MG PO TABS
6.2500 mg | ORAL_TABLET | Freq: Two times a day (BID) | ORAL | Status: DC
  Administered 2016-12-04 – 2016-12-09 (×10): 6.25 mg via ORAL
  Filled 2016-12-04 (×10): qty 1

## 2016-12-04 MED ORDER — GLUCERNA SHAKE PO LIQD
237.0000 mL | Freq: Three times a day (TID) | ORAL | Status: DC
  Administered 2016-12-04 – 2016-12-09 (×9): 237 mL via ORAL

## 2016-12-04 NOTE — Progress Notes (Signed)
Occupational Therapy Treatment Patient Details Name: Audrey Mullen MRN: [id number] DOB: January 31, 1941 Today's Date: 12/04/2016    History of present illness 76 yo admitted for CABG with expressive aphasia and Right weakness post op CT (-). PMHx: Afib, HTN, HLD, DM, nephrolithiasis   OT comments  Pt currently requires min guard assist for toilet transfer ambulating to bathroom and supervision for grooming activities x3 standing at the sink. Pt continues to require verbal cues to maintain sternal precautions during functional activities but does maintain with transfers. D/c plan remains appropriate. Will continue to follow acutely.   Follow Up Recommendations  Home health OT;Supervision/Assistance - 24 hour    Equipment Recommendations  None recommended by OT    Recommendations for Other Services      Precautions / Restrictions Precautions Precautions: Fall;Sternal Precaution Comments: pt able to recall 2/5 sternal precautions Restrictions Weight Bearing Restrictions: Yes (sternal precautions)       Mobility Bed Mobility Overal bed mobility: Needs Assistance Bed Mobility: Supine to Sit;Sit to Supine     Supine to sit: Supervision;HOB elevated Sit to supine: Supervision;HOB elevated   General bed mobility comments: Supervision for safety. Cues to maintain sternal precuations throughout.  Transfers Overall transfer level: Needs assistance Equipment used: Rolling walker (2 wheeled) Transfers: Sit to/from Stand Sit to Stand: Min guard         General transfer comment: good technique and hand placement    Balance Overall balance assessment: Needs assistance Sitting-balance support: Feet supported;No upper extremity supported Sitting balance-Leahy Scale: Good     Standing balance support: No upper extremity supported;During functional activity Standing balance-Leahy Scale: Good                     ADL Overall ADL's : Needs assistance/impaired      Grooming: Supervision/safety;Standing;Wash/dry face;Oral care;Wash/dry Geophysical data processor Transfer: Min guard;Ambulation;Comfort height toilet;RW   Toileting- Clothing Manipulation and Hygiene: Supervision/safety;Sitting/lateral lean Toileting - Clothing Manipulation Details (indicate cue type and reason): for peri care only     Functional mobility during ADLs: Min guard;Rolling walker General ADL Comments: Pt able to state sternal precautions but requires cues to maintain during functional activities.      Vision                     Perception     Praxis      Cognition   Behavior During Therapy: WFL for tasks assessed/performed Overall Cognitive Status: Within Functional Limits for tasks assessed                       Extremity/Trunk Assessment               Exercises     Shoulder Instructions       General Comments      Pertinent Vitals/ Pain       Pain Assessment: No/denies pain  Home Living                                          Prior Functioning/Environment              Frequency  Min 2X/week        Progress Toward Goals  OT Goals(current goals can now be found in the  care plan section)  Progress towards OT goals: Progressing toward goals  Acute Rehab OT Goals Patient Stated Goal: return home, sing at church OT Goal Formulation: With patient  Plan Discharge plan remains appropriate    Co-evaluation                 End of Session Equipment Utilized During Treatment: Gait belt;Rolling walker   Activity Tolerance Patient tolerated treatment well   Patient Left in bed;with call bell/phone within reach   Nurse Communication          Time: 1534-1550 OT Time Calculation (min): 16 min  Charges: OT General Charges $OT Visit: 1 Procedure OT Treatments $Self Care/Home Management : 8-22 mins  Binnie Kand M.S., OTR/L Pager: [phone]  12/04/2016, 3:53 PM

## 2016-12-04 NOTE — Progress Notes (Signed)
12/04/2016 11:50 AM EPW D/C'd per order and per protocol.  Ends intact. Pt. Tolerated well.  Advised bedrest x1hr.  Call bell in reach.  Vital signs collected per protocol. [place]

## 2016-12-04 NOTE — Progress Notes (Signed)
CARDIAC REHAB PHASE I   PRE:  Rate/Rhythm: 80 SR  BP:  Supine:   Sitting: 132/77  Standing:    SaO2: 96%RA  MODE:  Ambulation: 350 ft   POST:  Rate/Rhythm: 91 SR  BP:  Supine:   Sitting: 120/77  Standing:    SaO2: 99%RA 1005-1025 Pt walked 350 ft on RA with gait belt use and rolling walker. Gait fairly steady. Tired by end of walk and did not want to go farther. To recliner with call bell.   Graylon Good, RN BSN  12/04/2016 10:21 AM

## 2016-12-04 NOTE — Discharge Summary (Signed)
Physician Discharge Summary       [address]       [place] [address]             [phone]    Patient ID: Audrey Mullen MRN: [id number] DOB/AGE: November 28, 1940 76 y.o.  Admit date: 11/25/2016 Discharge date: 12/09/2016  Admission Diagnoses: Coronary artery disease  Active Diagnoses:  1. Angina pectoris ([place]) 2. Hypertension 3. Hyperlipidemia 4. Diabetes mellitus without complication ([place]) 5. GERD (gastroesophageal reflux disease) 6. Glaucoma-bilateral 7. Dysrhythmia-atrail fibrillation 8. Cerebral embolism with cerebral infarction 9. ABL anemia  Consults: neurology  Procedure (s):  Left Heart Cath and Coronary Angiography by Dr. Gwenlyn Found on 11/25/2016:  Conclusion     Ost LM lesion, 80 %stenosed.  Mid LAD lesion, 70 %stenosed.  Ost Cx to Prox Cx lesion, 80 %stenosed.  2nd Mrg lesion, 95 %stenosed.  Prox RCA lesion, 90 %stenosed.  Mid RCA lesion, 95 %stenosed.    Median sternotomy, extracorporeal circulation, coronary artery bypass grafting x4 (left internal mammary artery to left anterior descending, saphenous vein graft to second diagonal, saphenous vein graft to first obtuse marginal, saphenous vein graft to posterior descending), endoscopic vein harvest, right leg, left-sided MAZE Procedure by Dr. Roxan Hockey on 11/28/2016.  History of Presenting Illness: Audrey Mullen is a 76 yo woman with a past history of CAD, paroxysmal atrial fibrillation, diabetes mellitus (per chart, she denies), hypertension, hyperlipidemia, glaucoma, arthritis and GERD. She was hospitalized in May 2017 with urosepsis. During that admission she had atrial fib with a RVR. She also had an elevated troponin. An echocardiogram showed an EF of 55-60%. She was in SR at dc.  Over the past couple of months she has been having exertional chest tightness associated with shortness of breath. No radiation and no N/V or diaphoresis. Relieved by rest. No nocturnal or  rest symptoms. A stress test showed a reversible defect in the basal inferior wall. Today she had cardiac cath which revealed left main and 3 vessel CAD.   She is currently pain free. She lives at home with her husband. Life long nonsmoker.  Dr. Roxan Hockey discussed the general nature of the procedure, the need for general anesthesia, the use of cardiopulmonary bypass, and the incisions to be used with Audrey Mullen. Dr. Roxan Hockey discussed the expected hospital stay, overall recovery and short and long term outcomes. He informed her of the indications, risks, benefits and alternatives. She understands the risks and agrees to proceed with surgery. Pre operative carotid duplex US showed no significant internal bilateral carotid artery stenosis. ABIs' were 0.77 on the right and 0.95 on the left. Patient underwent a CABG x 4 on 11/28/2016.  Brief Hospital Course:  The patient was extubated the morning of post operative day one without difficulty. She remained afebrile and hemodynamically stable. Gordy Councilman, a line, chest tubes, and foley were removed early in the post operative course. Coreg was started and titrated accordingly. She was restarted on Coumadin (history of atrial fibrillation). PT and INR were monitored daily. Last INR was 1.86. She is currently on Coumadin 5 mg daily. She will need a PT/INR drawn 48 hours after discharge from the hospital. She was volume over loaded and diuresed. She had ABL anemia. She did require a post op transfusion on 11/30/2016. Last H and H was 9.5 and 29.7. She was weaned off the insulin drip.  The patient's glucose remained well controlled .The patient's HGA1C pre op was 7.6. She will need to follow up with her medical  doctor after discharge. She then developed right sided weakness and expressive aphasia early on 11/30/2016. Code stroke was called. CT of the head was negative for acute stroke. A consult was obtained with Dr. Cheral Marker (neurology). Speech pathology was  consulted for bedside swallow. Recommendations were followed accordingly. Bilateral duplex US was negative for DVT. Consults were obtained with PT and OT. Patient's expressive aphasia persisted. The patient was felt surgically stable for transfer from the ICU to [facility] for further convalescence on 12/03/2016. She continues to progress with cardiac rehab. She was ambulating on room air. She has been tolerating a diet (dysphagia 1-puree, nectar thick) and has had a bowel movement. Speech therapy is also assisting with dysarthria  Epicardial pacing wires were removed. Chest tube sutures will be removed the day of discharge. The patient is felt surgically stable for discharge today.    Latest Vital Signs: Blood pressure (!) 144/75, pulse 76, temperature 98.4 F (36.9 C), temperature source Oral, resp. rate 19, height 5\' 4"  (1.626 m), weight 153 lb 11.2 oz (69.7 kg), SpO2 98 %.  Physical Exam: General appearance: alert, cooperative and no distress Heart: regular rate and rhythm Lungs: clear to auscultation bilaterally Abdomen: soft, non-tender; bowel sounds normal; no masses,  no organomegaly Extremities: edema trace Wound: clean and dry  Discharge Condition:Stable and discharged to home.  Recent laboratory studies:  Lab Results  Component Value Date   WBC 9.7 12/04/2016   HGB 9.5 (L) 12/04/2016   HCT 29.7 (L) 12/04/2016   MCV 87.4 12/04/2016   PLT 235 12/04/2016   Lab Results  Component Value Date   NA 140 12/04/2016   K 4.8 12/04/2016   CL 109 12/04/2016   CO2 25 12/04/2016   CREATININE 1.21 (H) 12/06/2016   GLUCOSE 135 (H) 12/04/2016    Diagnostic Studies: Ct Angio Head W Or Wo Contrast  Result Date: 11/30/2016 CLINICAL DATA:  Left-sided weakness EXAM: CT ANGIOGRAPHY HEAD AND NECK TECHNIQUE: Multidetector CT imaging of the head and neck was performed using the standard protocol during bolus administration of intravenous contrast. Multiplanar CT image reconstructions and MIPs were  obtained to evaluate the vascular anatomy. Carotid stenosis measurements (when applicable) are obtained utilizing NASCET criteria, using the distal internal carotid diameter as the denominator. COMPARISON:  Head CT 11/30/2016 FINDINGS: CTA NECK FINDINGS Aortic arch: There is no aneurysm or dissection of the visualized ascending aorta or aortic arch. There is a normal 3 vessel branching pattern. The visualized proximal subclavian arteries are normal. Right carotid system: The right common carotid origin is widely patent. There is no common carotid or internal carotid artery dissection or aneurysm. No hemodynamically significant stenosis. Left carotid system: There is predominantly calcified atherosclerotic plaque at the proximal left internal carotid artery resulting in 50% stenosis. The remainder of the left ICA is normal. Vertebral arteries: The vertebral system is codominant. Both vertebral artery origins are normal. Both vertebral arteries are normal to their confluence with the basilar artery. Skeleton: Multilevel cervical degenerative disc disease.Uncovertebral hypertrophy causes moderate neural foraminal narrowing at right C5-C6. There is mild narrowing of the spinal canal at C4 and C5. No lytic or blastic lesions. Other neck: The nasopharynx is clear. The oropharynx and hypopharynx are normal. The epiglottis is normal. The supraglottic larynx, glottis and subglottic larynx are normal. No retropharyngeal collection. The parapharyngeal spaces are preserved. The parotid and submandibular glands are normal. No sialolithiasis or salivary ductal dilatation. The thyroid gland is normal. There is no cervical lymphadenopathy. Upper chest: There bilateral medium-sized  pleural effusions. Review of the MIP images confirms the above findings CTA HEAD FINDINGS Anterior circulation: --Intracranial internal carotid arteries: There is atherosclerotic calcification of the lacerum, cavernous and clinoid segments. No occlusion  or high-grade stenosis. --Anterior cerebral arteries: There is a hypoplastic left A1 segment, a normal congenital variant. Otherwise, the anterior cerebral arteries are normal. --Middle cerebral arteries: Normal. --Posterior communicating arteries: Absent bilaterally. Posterior circulation: --Posterior cerebral arteries: Normal. --Superior cerebellar arteries: Normal. --Basilar artery: Normal. --Anterior inferior cerebellar arteries: Normal. --Posterior inferior cerebellar arteries: Normal. Venous sinuses: As permitted by contrast timing, patent. Anatomic variants: None Delayed phase: No parenchymal contrast enhancement. Review of the MIP images confirms the above findings IMPRESSION: 1. No intracranial arterial occlusion or high-grade stenosis. 2. 50% narrowing of the proximal left internal carotid artery secondary to calcified atherosclerotic plaque. 3. Marked cervical degenerative disc disease with suspected mild-to-moderate spinal canal stenosis at the C4 and C5 levels. Electronically Signed   By: Ulyses Jarred M.D.   On: 11/30/2016 02:49    Ct Head Wo Contrast  Result Date: 12/01/2016 CLINICAL DATA:  Stroke EXAM: CT HEAD WITHOUT CONTRAST TECHNIQUE: Contiguous axial images were obtained from the base of the skull through the vertex without intravenous contrast. COMPARISON:  Head CT 11/30/2016 FINDINGS: Brain: No mass lesion, intraparenchymal hemorrhage or extra-axial collection. No evidence of acute cortical infarct. Area of hypoattenuation in the left subinsular parenchyma is unchanged. There is periventricular hypoattenuation compatible with chronic microvascular disease. Vascular: No hyperdense vessel or unexpected calcification. Skull: Normal visualized skull base, calvarium and extracranial soft tissues. Sinuses/Orbits: No sinus fluid levels or advanced mucosal thickening. No mastoid effusion. Normal orbits. IMPRESSION: Unchanged examination without intracranial hemorrhage or CT evidence of acute  infarct. Electronically Signed   By: Ulyses Jarred M.D.   On: 12/01/2016 06:19   Ct Angio Neck W Or Wo Contrast  Result Date: 11/30/2016 CLINICAL DATA:  Left-sided weakness EXAM: CT ANGIOGRAPHY HEAD AND NECK TECHNIQUE: Multidetector CT imaging of the head and neck was performed using the standard protocol during bolus administration of intravenous contrast. Multiplanar CT image reconstructions and MIPs were obtained to evaluate the vascular anatomy. Carotid stenosis measurements (when applicable) are obtained utilizing NASCET criteria, using the distal internal carotid diameter as the denominator. COMPARISON:  Head CT 11/30/2016 FINDINGS: CTA NECK FINDINGS Aortic arch: There is no aneurysm or dissection of the visualized ascending aorta or aortic arch. There is a normal 3 vessel branching pattern. The visualized proximal subclavian arteries are normal. Right carotid system: The right common carotid origin is widely patent. There is no common carotid or internal carotid artery dissection or aneurysm. No hemodynamically significant stenosis. Left carotid system: There is predominantly calcified atherosclerotic plaque at the proximal left internal carotid artery resulting in 50% stenosis. The remainder of the left ICA is normal. Vertebral arteries: The vertebral system is codominant. Both vertebral artery origins are normal. Both vertebral arteries are normal to their confluence with the basilar artery. Skeleton: Multilevel cervical degenerative disc disease.Uncovertebral hypertrophy causes moderate neural foraminal narrowing at right C5-C6. There is mild narrowing of the spinal canal at C4 and C5. No lytic or blastic lesions. Other neck: The nasopharynx is clear. The oropharynx and hypopharynx are normal. The epiglottis is normal. The supraglottic larynx, glottis and subglottic larynx are normal. No retropharyngeal collection. The parapharyngeal spaces are preserved. The parotid and submandibular glands are  normal. No sialolithiasis or salivary ductal dilatation. The thyroid gland is normal. There is no cervical lymphadenopathy. Upper chest: There bilateral medium-sized pleural  effusions. Review of the MIP images confirms the above findings CTA HEAD FINDINGS Anterior circulation: --Intracranial internal carotid arteries: There is atherosclerotic calcification of the lacerum, cavernous and clinoid segments. No occlusion or high-grade stenosis. --Anterior cerebral arteries: There is a hypoplastic left A1 segment, a normal congenital variant. Otherwise, the anterior cerebral arteries are normal. --Middle cerebral arteries: Normal. --Posterior communicating arteries: Absent bilaterally. Posterior circulation: --Posterior cerebral arteries: Normal. --Superior cerebellar arteries: Normal. --Basilar artery: Normal. --Anterior inferior cerebellar arteries: Normal. --Posterior inferior cerebellar arteries: Normal. Venous sinuses: As permitted by contrast timing, patent. Anatomic variants: None Delayed phase: No parenchymal contrast enhancement. Review of the MIP images confirms the above findings IMPRESSION: 1. No intracranial arterial occlusion or high-grade stenosis. 2. 50% narrowing of the proximal left internal carotid artery secondary to calcified atherosclerotic plaque. 3. Marked cervical degenerative disc disease with suspected mild-to-moderate spinal canal stenosis at the C4 and C5 levels. Electronically Signed   By: Ulyses Jarred M.D.   On: 11/30/2016 02:49   Dg Chest Port 1 View  Result Date: 12/02/2016 CLINICAL DATA:  CABG EXAM: PORTABLE CHEST 1 VIEW COMPARISON:  11/30/2016 FINDINGS: Right jugular central venous catheter has been removed. No pneumothorax Progression of bibasilar atelectasis since the prior study. Small pleural effusions bilaterally. Negative for pulmonary edema. IMPRESSION: Mild progression of bibasilar atelectasis and small effusions. Electronically Signed   By: Franchot Gallo M.D.   On:  12/02/2016 08:07   Dg Swallowing Func-speech Pathology  Result Date: 11/30/2016 Objective Swallowing Evaluation: Type of Study: MBS-Modified Barium Swallow Study Patient Details Name: NIREL SFERRAZZA MRN: [id number] Date of Birth: [birth year] Today's Date: 11/30/2016 Time: SLP Start Time (ACUTE ONLY): 1240-SLP Stop Time (ACUTE ONLY): 1305 SLP Time Calculation (min) (ACUTE ONLY): 25 min Past Medical History: Past Medical History: Diagnosis Date . Anginal pain ([place])  . Arthritis  . Coronary artery disease  . Diabetes mellitus without complication ([place])  . Dysrhythmia   atrail fibrillation . GERD (gastroesophageal reflux disease)  . Glaucoma   bilat  . Hyperlipidemia  . Hypertension  . Kidney stone  . Urinary tract infection  . Wears glasses  Past Surgical History: Past Surgical History: Procedure Laterality Date . CARDIAC CATHETERIZATION N/A 11/25/2016  Procedure: Left Heart Cath and Coronary Angiography;  Surgeon: Lorretta Harp, MD;  Location: [facility];  Service: Cardiovascular;  Laterality: N/A; . CORONARY ARTERY BYPASS GRAFT N/A 11/28/2016  Procedure: CORONARY ARTERY BYPASS GRAFTING (CABG) x 4;  Surgeon: Melrose Nakayama, MD;  Location: [facility];  Service: Open Heart Surgery;  Laterality: N/A; . CYSTOSCOPY WITH RETROGRADE PYELOGRAM, URETEROSCOPY AND STENT PLACEMENT Left 06/03/2016  Procedure: CYSTOSCOPY WITH LEFT RETROGRADE PYELOGRAM, URETEROSCOPY, REMOVAL OF LEFT NEPHROSTOMY TUBE, INSERTION OF DOUBLE J STENT, LEFT;  Surgeon: Carolan Clines, MD;  Location: [facility];  Service: Urology;  Laterality: Left; . ENDOVEIN HARVEST OF GREATER SAPHENOUS VEIN Right 11/28/2016  Procedure: ENDOVEIN HARVEST OF GREATER SAPHENOUS VEIN;  Surgeon: Melrose Nakayama, MD;  Location: [facility];  Service: Open Heart Surgery;  Laterality: Right; . EYE SURGERY    cataract removed per left eye  . HOLMIUM LASER APPLICATION Left [id number]  Procedure: HOLMIUM LASER OF STONE ;  Surgeon: Carolan Clines, MD;  Location: [facility];   Service: Urology;  Laterality: Left; Marland Kitchen MAZE N/A 11/28/2016  Procedure: MAZE;  Surgeon: Melrose Nakayama, MD;  Location: [facility];  Service: Open Heart Surgery;  Laterality: N/A; . ROTATOR CUFF REPAIR   . TEE WITHOUT CARDIOVERSION N/A 11/28/2016  Procedure: TRANSESOPHAGEAL ECHOCARDIOGRAM (  TEE);  Surgeon: Melrose Nakayama, MD;  Location: [facility];  Service: Open Heart Surgery;  Laterality: N/A; HPI: Pt is a 76 y.o. female with PMH of HTN, HLD, DM, nephrolithiasis, and PAF who presented for exertional chest tightness with SOB. Pt had CABG surgery on 1/11. Pt was intubated for procedure, extubated 1/12. On 1/13 pt had sudden onset RUE weakness, R facial droop and aphasia. 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 CXR showed stable dense LLL atelectasis, PNA or aspiration pneumonitis. Bedside swallow eval ordered. No Data Recorded Assessment / Plan / Recommendation CHL IP CLINICAL IMPRESSIONS 11/30/2016 Therapy Diagnosis Moderate oral phase dysphagia;Mild pharyngeal phase dysphagia Clinical Impression Pt currently presenting with a moderate oral/ mild pharyngeal dysphagia. Pt with a discoordinated oral phase/ weakness and decreassed sensation on R side resulting in piecemeal swallowing across consistencies. This, in combination with delay in swallow initiation resulted in aspiration of thin liquids when large sips were taken; aspiration inconsistently sensed by pt. Pt had difficulty following instructions for compensatory strategies including smaller sip size. Also persistent anterior loss on R side across consistencies. No penetration/ aspiration noted with nectar-thick liquids. Oral residuals present with solid consistency requiring cues to swallow again. Recommend initiating dysphagia 2 diet, nectar thick liquids, meds crushed in puree, full supervision during meals to encourage small bites/ sips, check for oral residuals, assist with feeding. Will continue to  follow for diet tolerance/ consider advancement. Impact on safety and function Moderate aspiration risk   CHL IP TREATMENT RECOMMENDATION 11/30/2016 Treatment Recommendations Therapy as outlined in treatment plan below   Prognosis 11/30/2016 Prognosis for Safe Diet Advancement Good Barriers to Reach Goals -- Barriers/Prognosis Comment -- CHL IP DIET RECOMMENDATION 11/30/2016 SLP Diet Recommendations Dysphagia 2 (Fine chop) solids;Nectar thick liquid Liquid Administration via Cup;Straw Medication Administration Crushed with puree Compensations Minimize environmental distractions;Slow rate;Small sips/bites;Monitor for anterior loss;Multiple dry swallows after each bite/sip Postural Changes Seated upright at 90 degrees;Remain semi-upright after after feeds/meals (Comment)   CHL IP OTHER RECOMMENDATIONS 11/30/2016 Recommended Consults -- Oral Care Recommendations Oral care BID Other Recommendations Order thickener from pharmacy   CHL IP FOLLOW UP RECOMMENDATIONS 11/30/2016 Follow up Recommendations Inpatient Rehab   CHL IP FREQUENCY AND DURATION 11/30/2016 Speech Therapy Frequency (ACUTE ONLY) min 2x/week Treatment Duration 2 weeks      CHL IP ORAL PHASE 11/30/2016 Oral Phase Impaired Oral - Pudding Teaspoon -- Oral - Pudding Cup -- Oral - Honey Teaspoon -- Oral - Honey Cup -- Oral - Nectar Teaspoon -- Oral - Nectar Cup Piecemeal swallowing;Right anterior bolus loss Oral - Nectar Straw Piecemeal swallowing;Right anterior bolus loss Oral - Thin Teaspoon -- Oral - Thin Cup Right anterior bolus loss;Piecemeal swallowing Oral - Thin Straw Right anterior bolus loss;Piecemeal swallowing Oral - Puree Piecemeal swallowing Oral - Mech Soft -- Oral - Regular Piecemeal swallowing;Lingual/palatal residue Oral - Multi-Consistency -- Oral - Pill -- Oral Phase - Comment --  CHL IP PHARYNGEAL PHASE 11/30/2016 Pharyngeal Phase Impaired Pharyngeal- Pudding Teaspoon -- Pharyngeal -- Pharyngeal- Pudding Cup -- Pharyngeal -- Pharyngeal- Honey  Teaspoon -- Pharyngeal -- Pharyngeal- Honey Cup -- Pharyngeal -- Pharyngeal- Nectar Teaspoon -- Pharyngeal -- Pharyngeal- Nectar Cup Delayed swallow initiation-vallecula Pharyngeal -- Pharyngeal- Nectar Straw Delayed swallow initiation-vallecula Pharyngeal -- Pharyngeal- Thin Teaspoon -- Pharyngeal -- Pharyngeal- Thin Cup Penetration/Aspiration before swallow;Penetration/Aspiration during swallow;Delayed swallow initiation-pyriform sinuses Pharyngeal Material enters airway, passes BELOW cords without attempt by patient to eject out (silent aspiration) Pharyngeal- Thin Straw  Delayed swallow initiation-vallecula;Penetration/Aspiration before swallow;Penetration/Aspiration during swallow;Delayed swallow initiation-pyriform sinuses Pharyngeal Material enters airway, passes BELOW cords then ejected out Pharyngeal- Puree Delayed swallow initiation-vallecula Pharyngeal -- Pharyngeal- Mechanical Soft -- Pharyngeal -- Pharyngeal- Regular Delayed swallow initiation-vallecula Pharyngeal -- Pharyngeal- Multi-consistency -- Pharyngeal -- Pharyngeal- Pill -- Pharyngeal -- Pharyngeal Comment --  CHL IP CERVICAL ESOPHAGEAL PHASE 11/30/2016 Cervical Esophageal Phase WFL Pudding Teaspoon -- Pudding Cup -- Honey Teaspoon -- Honey Cup -- Nectar Teaspoon -- Nectar Cup -- Nectar Straw -- Thin Teaspoon -- Thin Cup -- Thin Straw -- Puree -- Mechanical Soft -- Regular -- Multi-consistency -- Pill -- Cervical Esophageal Comment -- No flowsheet data found. Kern Reap, MA, CCC-SLP 11/30/2016, 1:37 PM [phone]             Ct Head Code Stroke Wo Contrast  Result Date: 11/30/2016 CLINICAL DATA:  Code stroke.  Aphasia and right-sided weakness EXAM: CT HEAD WITHOUT CONTRAST TECHNIQUE: Contiguous axial images were obtained from the base of the skull through the vertex without intravenous contrast. COMPARISON:  None. FINDINGS: Brain: No mass lesion, intraparenchymal hemorrhage or extra-axial collection. No evidence of acute cortical infarct. Left  subinsular hypoattenuation is favored to be chronic. There is periventricular hypoattenuation compatible with chronic microvascular disease. Vascular: No hyperdense vessel or unexpected calcification. Skull: Normal visualized skull base, calvarium and extracranial soft tissues. Sinuses/Orbits: No sinus fluid levels or advanced mucosal thickening. No mastoid effusion. Normal orbits. ASPECTS [facility] Stroke Program Early CT Score) - Ganglionic level infarction (caudate, lentiform nuclei, internal capsule, insula, M1-M3 cortex): 6 - Supraganglionic infarction (M4-M6 cortex): 3 Total score (0-10 with 10 being normal): 9 IMPRESSION: 1. No intracranial hemorrhage. 2. Left subinsular hypoattenuation is favored to be chronic, given preservation of overlying cortex. No other evidence of acute intracranial abnormality. 3. ASPECTS is 9. These results were called by telephone at the time of interpretation on 11/30/2016 at 1:44 am to Dr. Kerney Elbe, who verbally acknowledged these results. Electronically Signed   By: Ulyses Jarred M.D.   On: 11/30/2016 01:45     Discharge Instructions    Amb Referral to Cardiac Rehabilitation    Complete by:  As directed    Diagnosis:  CABG   CABG X ___:  4   Ambulatory referral to Neurology    Complete by:  As directed    Pt will follow up with Dr. Erlinda Hong at [facility] in about 2 months. Thanks.      Discharge Medications: Allergies as of 12/09/2016   No Known Allergies     Medication List    STOP taking these medications   acetaminophen 500 MG tablet Commonly known as:  TYLENOL   isosorbide mononitrate 30 MG 24 hr tablet Commonly known as:  IMDUR   nitroGLYCERIN 0.4 MG SL tablet Commonly known as:  NITROSTAT   spironolactone 25 MG tablet Commonly known as:  ALDACTONE     TAKE these medications   aspirin EC 81 MG tablet Take 1 tablet (81 mg total) by mouth daily.   carvedilol 6.25 MG tablet Commonly known as:  COREG Take 1 tablet (6.25 mg total) by mouth 2 (two)  times daily with a meal. What changed:  medication strength  how much to take  when to take this   diltiazem 120 MG 24 hr capsule Commonly known as:  CARDIZEM CD Take 1 capsule (120 mg total) by mouth daily. Start taking on:  12/10/2016 What changed:  medication strength  how much to take   latanoprost 0.005 % ophthalmic  solution Commonly known as:  XALATAN Place 1 drop into both eyes at bedtime.   pantoprazole 40 MG tablet Commonly known as:  PROTONIX Take 1 tablet (40 mg total) by mouth daily.   predniSONE 5 MG tablet Commonly known as:  DELTASONE Take 7.5 mg by mouth daily with breakfast.   RESOURCE THICKENUP CLEAR Powd To thicken fluids same as in hospital   rosuvastatin 20 MG tablet Commonly known as:  CRESTOR Take 20 mg by mouth daily.   traMADol 50 MG tablet Commonly known as:  ULTRAM Take 1-2 tablets (50-100 mg total) by mouth every 6 (six) hours as needed for moderate pain or severe pain.   warfarin 5 MG tablet Commonly known as:  COUMADIN Take 1 tablet (5 mg total) by mouth daily at 6 PM. As directed by coumadin clinic What changed:  medication strength  how much to take  how to take this  when to take this  additional instructions            Durable Medical Equipment        Start     Ordered   12/06/16 1240  For home use only DME Walker rolling  Once    Comments:  Post op CABG with CVA  Question:  Patient needs a walker to treat with the following condition  Answer:  Weakness   12/06/16 1239    The patient has been discharged on:   1.Beta Blocker:  Yes [ x  ]                              No   [   ]                              If No, reason:  2.Ace Inhibitor/ARB: Yes [   ]                                     No  [   x ]                                     If No, reason: Mildly elevated creatinine  3.Statin:   Yes [  x ]                  No  [   ]                  If No, reason:  4.Ecasa:  Yes  [  x ]                  No    [   ]                  If No, reason:  Follow Up Appointments: Follow-up Information    Xu,Jindong, MD. Schedule an appointment as soon as possible for a visit in 6 week(s).   Specialty:  Neurology Contact information: [address] [address] [phone]        Melrose Nakayama, MD Follow up.   Specialty:  Cardiothoracic Surgery Why:  PA/LAT CXR to be taken (at [facility] which is in the same building as Dr.  Jenissa Tyrell's office) on at;Appointment time is at Contact information: [address] [place] [address] [phone]        Elyn Peers, MD Follow up.   Specialty:  Family Medicine Why:  Call for a follow up regarding further management of diabets and surveillance of HGA1C 7.6  Contact information: [address] [place] [address] [phone]        [facility] Follow up on 12/09/2016.   Specialty:  Cardiology Why:  Appointment time is at 11:00 am Contact information: [address], [place], [place] Follow up on 12/24/2016.   Specialty:  Cardiology Why:  Appointment time is at 9:30 am Contact information: [place] [address] [phone]        [facility] Follow up.   Specialty:  Home Health Services Why:  HHRN/PT/SLP- arranged they will call you to set up home visits Contact information: [address] [place] [address] [place] Follow up.   Why:  rolling walker arranged- to be delivered to room prior to discharge Contact information: [address] [place] [address] [phone]           Signed: Macy Mis 12/09/2016, 2:08 PM

## 2016-12-04 NOTE — Progress Notes (Addendum)
      [address]       [place] [address]             [phone]      6 Days Post-Op Procedure(s) (LRB): CORONARY ARTERY BYPASS GRAFTING (CABG) x 4 (N/A) MAZE (N/A) TRANSESOPHAGEAL ECHOCARDIOGRAM (TEE) (N/A) ENDOVEIN HARVEST OF GREATER SAPHENOUS VEIN (Right)   Subjective:  Patient states she is doing ok.  She states she isn't eating very much because food doesn't taste good.  Working with PT/OT  + BM  Objective: Vital signs in last 24 hours: Temp:  [97.8 F (36.6 C)-98.4 F (36.9 C)] 97.8 F (36.6 C) (01/17 0328) Pulse Rate:  [77-88] 85 (01/17 0808) Cardiac Rhythm: Normal sinus rhythm (01/16 1945) Resp:  [17-23] 20 (01/17 0328) BP: (95-155)/(67-89) 123/72 (01/17 0808) SpO2:  [95 %-99 %] 97 % (01/17 0328) Weight:  [159 lb 4.8 oz (72.3 kg)] 159 lb 4.8 oz (72.3 kg) (01/17 0328)  Intake/Output from previous day: 01/16 0701 - 01/17 0700 In: 240 [P.O.:240] Out: 775 [Urine:775]  General appearance: alert, cooperative and no distress Heart: regular rate and rhythm Lungs: clear to auscultation bilaterally Abdomen: soft, non-tender; bowel sounds normal; no masses,  no organomegaly Extremities: edema trace Wound: clean and dry  Lab Results:  Recent Labs  12/03/16 0259 12/04/16 0316  WBC 10.1 9.7  HGB 9.5* 9.5*  HCT 28.9* 29.7*  PLT 197 235   BMET:  Recent Labs  12/03/16 0259 12/04/16 0316  NA 140 140  K 3.6 4.8  CL 109 109  CO2 24 25  GLUCOSE 111* 135*  BUN 14 15  CREATININE 1.00 1.17*  CALCIUM 8.6* 8.9    PT/INR:  Recent Labs  12/04/16 0316  LABPROT 15.8*  INR 1.25   ABG    Component Value Date/Time   PHART 7.378 11/29/2016 0707   HCO3 22.2 11/29/2016 0707   TCO2 24 11/29/2016 1730   ACIDBASEDEF 3.0 (H) 11/29/2016 0707   O2SAT 98.0 11/29/2016 0707   CBG (last 3)   Recent Labs  12/03/16 1633 12/03/16 2101 12/04/16 0603  GLUCAP 139* 161* 140*    Assessment/Plan: S/P Procedure(s) (LRB): CORONARY ARTERY BYPASS  GRAFTING (CABG) x 4 (N/A) MAZE (N/A) TRANSESOPHAGEAL ECHOCARDIOGRAM (TEE) (N/A) ENDOVEIN HARVEST OF GREATER SAPHENOUS VEIN (Right)  1. CV- Previous A. Fib, currently NSR with PVC, some SVT- will increase Coreg at 6.25 mg BID and Cardizem 2. Pulm- no acute issues, continue IS 3. Renal- creatinine has been stable, K at 4.8- no further supplementation indicated, not on Lasix 4. Neuro- S/P stroke, residual aphasia present, dysphagia diet indicated, will advance as SLP allows 5. Nutrition- poor oral intake, add glucerna prn 6. INR 1.25, continue Coumadin at 7.5 mg daily 7. Dispo- patient stable, increase Coreg with episodes of SVT, PVCs, continue Coumadin at 7.5 mg daily, will d/c EPW, diet per SLP added glucerna with poor oral intake, continue current care  LOS: 9 days    Audrey Mullen, Audrey Mullen 12/04/2016 Patient seen and examined, agree with above Her expressive aphasia is still present but seems a little better today  Remo Lipps C. Roxan Hockey, MD [facility] [phone]

## 2016-12-04 NOTE — Progress Notes (Signed)
Speech Language Pathology Treatment: Dysphagia  Patient Details Name: Audrey Mullen MRN: [id number] DOB: 09/21/1941 Today's Date: 12/04/2016 Time: 1110-1120 SLP Time Calculation (min) (ACUTE ONLY): 10 min  Assessment / Plan / Recommendation Clinical Impression  F/u for swallowing - pt with excellent toleration current diet with improved attention to anterior loss/pocketing; no s/s of aspiration with consumption of nectar-thick liquids.  Explained temporary need for thickened liquids to pt, rationale for its use.  Pt verbalized understanding.  Speech is dysarthric; chart dx of aphasia.  Pt would benefit from speech/language evaluation - our services ordered only for swallowing.   HPI HPI: Pt is a 76 y.o. female with PMH of HTN, HLD, DM, nephrolithiasis, and PAF who presented for exertional chest tightness with SOB. Pt had CABG surgery on 1/11. Pt was intubated for procedure, extubated 1/12. On 1/13 pt had sudden onset RUE weakness, R facial droop and aphasia. 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 CXR showed stable dense LLL atelectasis, PNA or aspiration pneumonitis. Bedside swallow eval ordered.      SLP Plan  Continue with current plan of care     Recommendations  Diet recommendations: Dysphagia 1 (puree);Nectar-thick liquid Liquids provided via: Cup;Straw Medication Administration: Crushed with puree Compensations: Minimize environmental distractions;Slow rate;Small sips/bites;Monitor for anterior loss;Multiple dry swallows after each bite/sip                Oral Care Recommendations: Oral care BID Plan: Continue with current plan of care       GO                Audrey Mullen 12/04/2016, 11:24 AM  Audrey Bamberg L. Messina Kosinski, MA CCC/SLP

## 2016-12-05 LAB — GLUCOSE, CAPILLARY
Glucose-Capillary: 133 mg/dL — ABNORMAL HIGH (ref 65–99)
Glucose-Capillary: 221 mg/dL — ABNORMAL HIGH (ref 65–99)
Glucose-Capillary: 183 mg/dL — ABNORMAL HIGH (ref 65–99)

## 2016-12-05 LAB — PROTIME-INR
INR: 1.37
PROTHROMBIN TIME: 17 s — AB (ref 11.4–15.2)

## 2016-12-05 MED ORDER — WARFARIN SODIUM 7.5 MG PO TABS
7.5000 mg | ORAL_TABLET | Freq: Once | ORAL | Status: AC
  Administered 2016-12-05: 7.5 mg via ORAL
  Filled 2016-12-05: qty 1

## 2016-12-05 NOTE — Progress Notes (Signed)
      [address]       [place] [address]             [phone]     CARDIOTHORACIC SURGERY PROGRESS NOTE  7 Days Post-Op  S/P Procedure(s) (LRB): CORONARY ARTERY BYPASS GRAFTING (CABG) x 4 (N/A) MAZE (N/A) TRANSESOPHAGEAL ECHOCARDIOGRAM (TEE) (N/A) ENDOVEIN HARVEST OF GREATER SAPHENOUS VEIN (Right)  Subjective: No complaints. Says her appetite is good, and she is eating well. Has been working well with PT/OT.   Objective: Vital signs in last 24 hours: Temp:  [98.2 F (36.8 C)-98.7 F (37.1 C)] 98.2 F (36.8 C) (01/18 0542) Pulse Rate:  [77-83] 83 (01/18 0756) Cardiac Rhythm: Normal sinus rhythm (01/17 2200) Resp:  [18-22] 22 (01/18 0542) BP: (120-150)/(61-84) 140/83 (01/18 0756) SpO2:  [95 %-98 %] 95 % (01/18 0542) Weight:  [71.4 kg (157 lb 8 oz)] 71.4 kg (157 lb 8 oz) (01/18 0542)  Physical Exam:  General:   Well appearing, alert and oriented  Breath sounds: CTAB  Heart sounds:  Irregularly irregular rhythm, Normal rate. No murmur, gallop, or rub on auscultation.   Incisions:  Clean, dry and intact  Abdomen:  Soft and nontender, normoactive bowel sounds  Extremities:  Warm and no edema   Intake/Output from previous day: 01/17 0701 - 01/18 0700 In: 343 [P.O.:340; I.V.:3] Out: 560 [Urine:560] Intake/Output this shift: No intake/output data recorded.  Lab Results:  Recent Labs  12/03/16 0259 12/04/16 0316  WBC 10.1 9.7  HGB 9.5* 9.5*  HCT 28.9* 29.7*  PLT 197 235   BMET:  Recent Labs  12/03/16 0259 12/04/16 0316  NA 140 140  K 3.6 4.8  CL 109 109  CO2 24 25  GLUCOSE 111* 135*  BUN 14 15  CREATININE 1.00 1.17*  CALCIUM 8.6* 8.9    CBG (last 3)   Recent Labs  12/04/16 1616 12/04/16 2226 12/05/16 0637  GLUCAP 158* 128* 133*   PT/INR:   Recent Labs  12/05/16 0325  LABPROT 17.0*  INR 1.37     Assessment/Plan: S/P Procedure(s) (LRB): CORONARY ARTERY BYPASS GRAFTING (CABG) x 4 (N/A) MAZE (N/A) TRANSESOPHAGEAL  ECHOCARDIOGRAM (TEE) (N/A) ENDOVEIN HARVEST OF GREATER SAPHENOUS VEIN (Right)  1 CV - further episodes of A. Fib and SVT overnight - could possibly benefit from increasing Cardizem 2 Pulm - continue IS 3 Neuro - S/P stroke, very mild aphasia if any still present, advance diet as recommended per SLP 4 Nutrition - continue to encourage PO intake, continue glucerna prn 5 INR 1.37 from 1.25 yesterday - continue current Coumadin dose 6 patient stable and progressing well   Brigid Re, PA-S 12/05/2016 8:18 AM Patient seen and examined, agree with above Speech - aphasia improving, but still dysarthric Have placed order for Speech to actually work with her on speech which frankly is surprising that I need to do so  [place] C. Roxan Hockey, MD [facility] [phone]

## 2016-12-05 NOTE — Progress Notes (Signed)
CARDIAC REHAB PHASE I   PRE:  Rate/Rhythm: 77 SR  BP:  Supine:   Sitting: 129/69  Standing:    SaO2: 97%RA  MODE:  Ambulation: 240 ft   POST:  Rate/Rhythm: 85 SR  BP:  Supine:   Sitting: 129/66  Standing:    SaO2: 99%RA 1107-1125 Pt walked 240 ft on RA with rolling walker and asst x 1 with steady gait. Did c/o lightheadedness and stopped abruptly. Declined need to sit.  Was able to complete walk. Tired by end. Encouraged IS and flutter valve.   Graylon Good, RN BSN  12/05/2016 11:23 AM

## 2016-12-06 LAB — GLUCOSE, CAPILLARY
GLUCOSE-CAPILLARY: 184 mg/dL — AB (ref 65–99)
GLUCOSE-CAPILLARY: 162 mg/dL — AB (ref 65–99)
Glucose-Capillary: 117 mg/dL — ABNORMAL HIGH (ref 65–99)
Glucose-Capillary: 163 mg/dL — ABNORMAL HIGH (ref 65–99)

## 2016-12-06 LAB — CREATININE, SERUM
Creatinine, Ser: 1.21 mg/dL — ABNORMAL HIGH (ref 0.44–1.00)
GFR calc Af Amer: 49 mL/min — ABNORMAL LOW (ref >60)
GFR, EST NON AFRICAN AMERICAN: 42 mL/min — AB (ref >60)

## 2016-12-06 LAB — PROTIME-INR
INR: 1.5
Prothrombin Time: 18.3 seconds — ABNORMAL HIGH (ref 11.4–15.2)

## 2016-12-06 MED ORDER — WARFARIN SODIUM 7.5 MG PO TABS
7.5000 mg | ORAL_TABLET | Freq: Every day | ORAL | Status: AC
  Administered 2016-12-06: 7.5 mg via ORAL
  Filled 2016-12-06: qty 1

## 2016-12-06 NOTE — Care Management Note (Signed)
Case Management Note Previous CM note initiated by Maryclare Labrador, RN 12/02/2016, 2:27 PM   Patient Details  Name: Audrey Mullen MRN: [id number] Date of Birth: May 23, 1941  Subjective/Objective:   Pt is s/p CABG                 Action/Plan:  PTA independent from home with husband - husband confirmed that he will be with pt post discharge for 24/7.  Choice given for [facility] and DME - [facility] chosen for both.  CM requested HH/DME order via physician sticky note tab.  CM will continue to follow for discharge needs   Expected Discharge Date:     12/06/16             Expected Discharge Plan:  [place]  In-House Referral:     Discharge planning Services  CM Consult  Post Acute Care Choice:  Home Health, Durable Medical Equipment Choice offered to:  Patient  DME Arranged:  Walker rolling DME Agency:  [facility] Arranged:  RN, PT, Speech Therapy [place] Agency:  [facility]  Status of Service:  Completed, signed off  If discussed at [place] of Stay Meetings, dates discussed:    Discharge Disposition: home with home health   Additional Comments:  12/06/16- 1200- Marvetta Gibbons RN, CM- orders placed for HHRN/OT/SLP- spoke with Santiago Glad at [facility]- they will not be able to accept referral- f/u done with pt  At bedside to offer choice again for another agency- pt has selected [facility] for [facility] services- referral called to [place] with [facility]- referral confirmed accepted- for HHRN/PT/SLP. - per cardiac rehab pt will also need RW- have notified Shannon with [facility] for DME need- RW will be delivered to room prior to discharge.   Dahlia Client [place], RN 12/06/2016, 12:46 PM [phone]

## 2016-12-06 NOTE — Evaluation (Signed)
Speech Language Pathology Evaluation Patient Details Name: Audrey Mullen MRN: [id number] DOB: 06-Sep-1941 Today's Date: 12/06/2016 Time: [id number] SLP Time Calculation (min) (ACUTE ONLY): 22 min  Problem List:  Patient Active Problem List   Diagnosis Date Noted  . Cerebral embolism with cerebral infarction 11/30/2016  . 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 Coronary artery disease due to lipid rich plaque 11/25/2016  . 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[place])   . Abnormal nuclear stress test   . Bacteremia   . Hydronephrosis   . Ureteral stone with hydronephrosis   . Sepsis ([place]) 04/02/2016  . AKI (acute kidney injury) ([place]) 04/02/2016  . Pyelonephritis, acute 04/02/2016  . Atrial fibrillation with RVR ([place]) 04/02/2016   Past Medical History:  Past Medical History:  Diagnosis Date  . Anginal pain ([place])   . Arthritis   . Coronary artery disease   . Diabetes mellitus without complication ([place])   . Dysrhythmia    atrail fibrillation  . GERD (gastroesophageal reflux disease)   . Glaucoma    bilat   . Hyperlipidemia   . Hypertension   . Kidney stone   . Urinary tract infection   . Wears glasses    Past Surgical History:  Past Surgical History:  Procedure Laterality Date  . CARDIAC CATHETERIZATION N/A 11/25/2016   Procedure: Left Heart Cath and Coronary Angiography;  Surgeon: Lorretta Harp, MD;  Location: [facility];  Service: Cardiovascular;  Laterality: N/A;  . CORONARY ARTERY BYPASS GRAFT N/A 11/28/2016   Procedure: CORONARY ARTERY BYPASS GRAFTING (CABG) x 4;  Surgeon: Melrose Nakayama, MD;  Location: [facility];  Service: Open Heart Surgery;  Laterality: N/A;  . CYSTOSCOPY WITH RETROGRADE PYELOGRAM, URETEROSCOPY AND STENT PLACEMENT Left 06/03/2016   Procedure: CYSTOSCOPY WITH LEFT RETROGRADE PYELOGRAM, URETEROSCOPY, REMOVAL OF LEFT NEPHROSTOMY TUBE, INSERTION OF DOUBLE J STENT, LEFT;  Surgeon: Carolan Clines, MD;  Location: [facility];  Service: Urology;  Laterality: Left;  .  ENDOVEIN HARVEST OF GREATER SAPHENOUS VEIN Right 11/28/2016   Procedure: ENDOVEIN HARVEST OF GREATER SAPHENOUS VEIN;  Surgeon: Melrose Nakayama, MD;  Location: [facility];  Service: Open Heart Surgery;  Laterality: Right;  . EYE SURGERY     cataract removed per left eye   . HOLMIUM LASER APPLICATION Left [id number]   Procedure: HOLMIUM LASER OF STONE ;  Surgeon: Carolan Clines, MD;  [facility];  Service: Urology;  Laterality: Left;  Marland Kitchen MAZE N/A 11/28/2016   Procedure: MAZE;  Surgeon: Melrose Nakayama, MD;  Location: [facility];  Service: Open Heart Surgery;  Laterality: N/A;  . ROTATOR CUFF REPAIR    . TEE WITHOUT CARDIOVERSION N/A 11/28/2016   Procedure: TRANSESOPHAGEAL ECHOCARDIOGRAM (TEE);  Surgeon: Melrose Nakayama, MD;  Location: [facility];  Service: Open Heart Surgery;  Laterality: N/A;   HPI:  Pt is a 76 y.o. female with PMH of HTN, HLD, DM, nephrolithiasis, and PAF who presented for exertional chest tightness with SOB. Pt had CABG surgery on 1/11. Pt was intubated for procedure, extubated 1/12. On 1/13 pt had sudden onset RUE weakness, R facial droop and aphasia. 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 CXR showed stable dense LLL atelectasis, PNA or aspiration pneumonitis. She has been followed by SLP for swallowing; speech/language evaluation ordered due to chart dx of aphasia and noted dysarthria.   Assessment / Plan / Recommendation Clinical Impression  Patient presents with reduced intelligibility 2/2 dysarthria, as well as  cognitive-linguistic deficits in memory and executive function (sequencing). She reports changes in thinking and memory since hospitalization. Delayed recall 1/5, verbal basic sequencing 0/5. She displayed some difficulty with sustained attention task, however this may be secondary to complaints of difficulty hearing. Evaluated language using WAB-Bedside form; Auditory verbal  score 9/10, Sequential  commands 10/10, repetition 10/10, naming 10/10, nonverbal apraxia score 10/10. Auditory comprehension and verbal expression appear within functional limits. Recommend skilled SLP intervention to address dysarthria and cognitive communication. SLP will continue to follow for dysphagia.    SLP Assessment  Patient needs continued Speech Lanaguage Pathology Services    Follow Up Recommendations  Inpatient Rehab    Frequency and Duration min 2x/week  2 weeks      SLP Evaluation Cognition  Overall Cognitive Status: Impaired/Different from baseline Arousal/Alertness: Awake/alert Orientation Level: Oriented X4 Attention: Sustained Sustained Attention: Impaired Sustained Attention Impairment: Verbal basic Memory: Impaired Memory Impairment: Decreased recall of new information Awareness: Appears intact Problem Solving: Appears intact Executive Function: Sequencing Sequencing: Impaired Sequencing Impairment: Verbal basic Safety/Judgment: Appears intact       Comprehension  Auditory Comprehension Overall Auditory Comprehension: Appears within functional limits for tasks assessed Yes/No Questions: Within Functional Limits Commands: Within Functional Limits Conversation: Simple Interfering Components: Hearing EffectiveTechniques: Repetition;Visual/Gestural cues;Increased volume;Slowed speech Visual Recognition/Discrimination Discrimination: Within Function Limits Reading Comprehension Reading Status: Not tested    Expression Expression Primary Mode of Expression: Verbal Verbal Expression Overall Verbal Expression: Appears within functional limits for tasks assessed Repetition: No impairment Naming: No impairment Pragmatics: No impairment Interfering Components: Speech intelligibility Written Expression Dominant Hand: Right Written Expression: Within Functional Limits   Oral / Motor  Oral Motor/Sensory Function Overall Oral Motor/Sensory Function: Moderate impairment Facial  ROM: Reduced right;Suspected CN VII (facial) dysfunction Facial Symmetry: Abnormal symmetry right;Suspected CN VII (facial) dysfunction Facial Strength: Reduced right;Suspected CN VII (facial) dysfunction Facial Sensation: Within Functional Limits Lingual ROM: Within Functional Limits Lingual Symmetry: Abnormal symmetry right;Suspected CN XII (hypoglossal) dysfunction Lingual Strength: Reduced Mandible: Within Functional Limits Motor Speech Overall Motor Speech: Impaired Respiration: Within functional limits Phonation: Low vocal intensity Resonance: Within functional limits Articulation: Impaired Level of Impairment: Word Intelligibility: Intelligibility reduced Conversation: 50-74% accurate Motor Planning: Witnin functional limits Motor Speech Errors: Consistent Interfering Components: Hearing loss Effective Techniques: Slow rate;Increased vocal intensity;Over-articulate   GO                   Deneise Lever, [place] CF-SLP Speech-Language Pathologist [phone]  Aliene Altes 12/06/2016, 1:52 PM

## 2016-12-06 NOTE — Progress Notes (Signed)
Speech Language Pathology Treatment: Dysphagia  Patient Details Name: Audrey Mullen MRN: [id number] DOB: 06-06-1941 Today's Date: 12/06/2016 Time: 1210-1220 SLP Time Calculation (min) (ACUTE ONLY): 10 min  Assessment / Plan / Recommendation Clinical Impression  Patient seen with upgraded PO trials of thin liquids and regular solids. Oral manipulation appears much improved; patient self-monitors for pocketing and is noted to use lingual and finger sweep of bolus to strong side to facilitate oral clearance. With thin liquids, patient consumes via cup and straw with no overt signs or symptoms of aspiration despite challenging with consecutive sips. Due to intermittent silent aspiration observed on MBS, recommend repeat MBS prior to upgrade. Plan to follow up for instrumental testing, likely Monday 1/22. Continue dysphagia 1/ nectar thick liquids with meds crushed in puree.   HPI HPI: Pt is a 76 y.o. female with PMH of HTN, HLD, DM, nephrolithiasis, and PAF who presented for exertional chest tightness with SOB. Pt had CABG surgery on 1/11. Pt was intubated for procedure, extubated 1/12. On 1/13 pt had sudden onset RUE weakness, R facial droop and aphasia. 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 CXR showed stable dense LLL atelectasis, PNA or aspiration pneumonitis. Bedside swallow eval ordered.      SLP Plan  MBS;Continue with current plan of care     Recommendations  Diet recommendations: Nectar-thick liquid;Dysphagia 1 (puree) Liquids provided via: Cup;Straw Medication Administration: Crushed with puree Supervision: Full supervision/cueing for compensatory strategies Compensations: Minimize environmental distractions;Slow rate;Small sips/bites;Monitor for anterior loss;Multiple dry swallows after each bite/sip Postural Changes and/or Swallow Maneuvers: Seated upright 90 degrees                Oral Care Recommendations:  Oral care BID Follow up Recommendations: Inpatient Rehab Plan: MBS;Continue with current plan of care       [place], [place] CF-SLP Speech-Language Pathologist [phone]            Aliene Altes 12/06/2016, 1:24 PM

## 2016-12-06 NOTE — Progress Notes (Signed)
CARDIAC REHAB PHASE I   PRE:  Rate/Rhythm: 77 SR  BP:  Supine:   Sitting: 124/68  Standing:    SaO2: 96%RA  MODE:  Ambulation: 300 ft   POST:  Rate/Rhythm: 88 SR  BP:  Supine:   Sitting: 106/67  Standing:    SaO2: 99%RA 1120-1142 Pt walked 300 ft with rolling walker and asst x1 with steady gait. Stopped a couple of times to rest. Pt does not have a rolling walker and will need one for home use. Will discuss with case manager. Discussed CRP 2 and will refer to [facility]. Pt wants family here for ed so will try to ed when family here. To recliner with call bell. Encouraged IS and flutter valve.    Graylon Good, RN BSN  12/06/2016 11:39 AM

## 2016-12-06 NOTE — Progress Notes (Signed)
Physical Therapy Treatment Patient Details Name: Audrey Mullen MRN: [id number] DOB: February 11, 1941 Today's Date: 2017/01/03    History of Present Illness 76 yo admitted for CABG with expressive aphasia and Right weakness post op CT (-). PMHx: Afib, HTN, HLD, DM, nephrolithiasis    PT Comments    Patient making good gains with mobility and gait.  Follow Up Recommendations  Home health PT     Equipment Recommendations  Rolling walker with 5" wheels    Recommendations for Other Services       Precautions / Restrictions Precautions Precautions: Fall;Sternal Precaution Comments: Reviewed sternal precautions Restrictions Weight Bearing Restrictions: Yes Other Position/Activity Restrictions: Sternal precautions    Mobility  Bed Mobility Overal bed mobility: Needs Assistance Bed Mobility: Sit to Supine       Sit to supine: Min assist;HOB elevated   General bed mobility comments: Verbal cues for technique to maintain sternal precautions.  Assist to bring LE's onto bed.  Transfers Overall transfer level: Needs assistance Equipment used: Rolling walker (2 wheeled) Transfers: Sit to/from Stand Sit to Stand: Min guard         General transfer comment: good technique and hand placement  Ambulation/Gait Ambulation/Gait assistance: Min guard Ambulation Distance (Feet): 250 Feet Assistive device: Rolling walker (2 wheeled) Gait Pattern/deviations: Step-through pattern;Decreased stride length;Trunk flexed Gait velocity: decreased Gait velocity interpretation: Below normal speed for age/gender General Gait Details: Verbal cues to maintain upright posture.  Demonstrated safe use of RW.   Stairs            Wheelchair Mobility    Modified Rankin (Stroke Patients Only)       Balance           Standing balance support: No upper extremity supported Standing balance-Leahy Scale: Fair                      Cognition Arousal/Alertness:  Awake/alert Behavior During Therapy: WFL for tasks assessed/performed Overall Cognitive Status: Impaired/Different from baseline Area of Impairment: Attention;Memory;Problem solving   Current Attention Level: Sustained Memory: Decreased short-term memory       Problem Solving: Difficulty sequencing      Exercises      General Comments        Pertinent Vitals/Pain Pain Assessment: No/denies pain    Home Living                      Prior Function            PT Goals (current goals can now be found in the care plan section) Acute Rehab PT Goals Patient Stated Goal: return home, sing at church Progress towards PT goals: Progressing toward goals    Frequency    Min 3X/week      PT Plan Current plan remains appropriate    Co-evaluation             End of Session Equipment Utilized During Treatment: Gait belt Activity Tolerance: Patient tolerated treatment well Patient left: in bed;with call bell/phone within reach;with bed alarm set     Time: [id number] PT Time Calculation (min) (ACUTE ONLY): 14 min  Charges:  $Gait Training: 8-22 mins                    G Codes:      Despina Pole Jan 03, 2017, 7:38 PM Carita Pian. Sanjuana Kava, [place] Pager [phone]

## 2016-12-06 NOTE — Progress Notes (Addendum)
      [address]       [place] [address]             [phone]     CARDIOTHORACIC SURGERY PROGRESS NOTE  8 Days Post-Op  S/P Procedure(s) (LRB): CORONARY ARTERY BYPASS GRAFTING (CABG) x 4 (N/A) MAZE (N/A) TRANSESOPHAGEAL ECHOCARDIOGRAM (TEE) (N/A) ENDOVEIN HARVEST OF GREATER SAPHENOUS VEIN (Right)  Subjective: Patient still struggling with speech some, but feels it is improving. Doing well with PT/OT. Appetite good, denies any difficulty with swallowing.   Objective: Vital signs in last 24 hours: Temp:  [98 F (36.7 C)-98.4 F (36.9 C)] 98.4 F (36.9 C) (01/19 0531) Pulse Rate:  [72-83] 83 (01/19 0531) Cardiac Rhythm: Normal sinus rhythm (01/18 2130) Resp:  [18-20] 18 (01/19 0531) BP: (112-130)/(62-72) 112/72 (01/19 0531) SpO2:  [95 %-98 %] 95 % (01/19 0531) Weight:  [70.5 kg (155 lb 8 oz)] 70.5 kg (155 lb 8 oz) (01/19 0531)  Physical Exam:  General:   Well appearing, alert and oriented  Breath sounds: CTAB  Heart sounds:  NSR with good rate. No murmur, gallop, or rub on auscultation.  Incisions:  Clean, dry and intact  Abdomen:  Soft and nontender, normoactive bowel sounds  Extremities:  Warm and without edema   Intake/Output from previous day: 01/18 0701 - 01/19 0700 In: 363 [P.O.:360; I.V.:3] Out: 600 [Urine:600] Intake/Output this shift: No intake/output data recorded.  Lab Results:  Recent Labs  12/04/16 0316  WBC 9.7  HGB 9.5*  HCT 29.7*  PLT 235   BMET:  Recent Labs  12/04/16 0316 12/06/16 0459  NA 140  --   K 4.8  --   CL 109  --   CO2 25  --   GLUCOSE 135*  --   BUN 15  --   CREATININE 1.17* 1.21*  CALCIUM 8.9  --     CBG (last 3)   Recent Labs  12/05/16 1223 12/05/16 1606 12/06/16 0620  GLUCAP 221* 183* 117*   PT/INR:   Recent Labs  12/06/16 0459  LABPROT 18.3*  INR 1.50     Assessment/Plan: S/P Procedure(s) (LRB): CORONARY ARTERY BYPASS GRAFTING (CABG) x 4 (N/A) MAZE (N/A) TRANSESOPHAGEAL  ECHOCARDIOGRAM (TEE) (N/A) ENDOVEIN HARVEST OF GREATER SAPHENOUS VEIN (Right)  1 CV - No A. Fib, no SVT - continue coreg and cardizem 2 Pulm - continue IS 3 Neuro - S/P stroke, mild aphasia still present, continue speech therapy 4 INR - 1.5 today, continue current dose coumadin 5 Renal function below average, but stable 6 Dispo - will need home PT/OT/SLP, may be ready for discharge sometime this weekend.   Brigid Re, PA-S 12/06/2016 8:08 AM  Patient seen and examined, agree with above Stage III CKD- stable, acute kidney injury on admission resolved speech seems to improve a little each day Home when INR ~ 2  Remo Lipps C. Roxan Hockey, MD [facility] [phone]

## 2016-12-06 NOTE — Care Management Important Message (Signed)
Important Message  Patient Details  Name: Audrey Mullen MRN: [id number] Date of Birth: 1940-12-20   Medicare Important Message Given:  Yes    [name] May 12/06/2016, 4:56 PM

## 2016-12-07 LAB — PROTIME-INR
INR: 1.75
Prothrombin Time: 20.7 seconds — ABNORMAL HIGH (ref 11.4–15.2)

## 2016-12-07 LAB — GLUCOSE, CAPILLARY
GLUCOSE-CAPILLARY: 113 mg/dL — AB (ref 65–99)
GLUCOSE-CAPILLARY: 177 mg/dL — AB (ref 65–99)
GLUCOSE-CAPILLARY: 210 mg/dL — AB (ref 65–99)
Glucose-Capillary: 235 mg/dL — ABNORMAL HIGH (ref 65–99)

## 2016-12-07 NOTE — Progress Notes (Signed)
      [address]       [place] [address]             [phone]     CARDIOTHORACIC SURGERY PROGRESS NOTE  9 Days Post-Op  S/P Procedure(s) (LRB): CORONARY ARTERY BYPASS GRAFTING (CABG) x 4 (N/A) MAZE (N/A) TRANSESOPHAGEAL ECHOCARDIOGRAM (TEE) (N/A) ENDOVEIN HARVEST OF GREATER SAPHENOUS VEIN (Right)  Subjective: Still having speech difficulty. Appetite good, but does not like hospital food.   Objective: Vital signs in last 24 hours: Temp:  [97.4 F (36.3 C)-98.5 F (36.9 C)] 98.2 F (36.8 C) (01/20 0511) Pulse Rate:  [75-81] 75 (01/20 0511) Cardiac Rhythm: Normal sinus rhythm (01/19 2020) Resp:  [17-18] 17 (01/20 0511) BP: (106-132)/(59-78) 132/69 (01/20 0511) SpO2:  [95 %-97 %] 95 % (01/20 0511) Weight:  [70.4 kg (155 lb 3.2 oz)] 70.4 kg (155 lb 3.2 oz) (01/20 0511)  Physical Exam:  General:   Well appearing, alert and oriented  Breath sounds: CTAB  Heart sounds:  Regular rate and rhythm. No murmur, gallop or rub  Incisions:  Clean, dry and intact  Abdomen:  Soft and nontender  Extremities:  Warm without edema   Intake/Output from previous day: 01/19 0701 - 01/20 0700 In: 240 [P.O.:240] Out: -  Intake/Output this shift: Total I/O In: -  Out: 150 [Urine:150]  Lab Results: No results for input(s): WBC, HGB, HCT, PLT in the last 72 hours. BMET:  Recent Labs  12/06/16 0459  CREATININE 1.21*    CBG (last 3)   Recent Labs  12/06/16 1641 12/06/16 2122 12/07/16 0626  GLUCAP 184* 162* 113*   PT/INR:   Recent Labs  12/07/16 0201  LABPROT 20.7*  INR 1.75     Assessment/Plan: S/P Procedure(s) (LRB): CORONARY ARTERY BYPASS GRAFTING (CABG) x 4 (N/A) MAZE (N/A) TRANSESOPHAGEAL ECHOCARDIOGRAM (TEE) (N/A) ENDOVEIN HARVEST OF GREATER SAPHENOUS VEIN (Right)  1 CV - No A. Fib, no SVT - continue coreg and cardizem 2 Pulm - continue IS 3 Neuro - S/P stroke, mild aphasia still present, continue speech therapy 4 INR - 1.75 today,  continue current dose coumadin 5 Stage III CKD - stable 6 Dispo - will need home PT/OT/SLP. Could potentially go tomorrow if INR 2, will need to send home on lower dose coumadin.   Brigid Re, PA-S 12/07/2016 9:20 AM  I have seen and examined the patient and agree with the assessment and plan as outlined.  Rexene Alberts, MD 12/07/2016 10:39 AM

## 2016-12-07 NOTE — Progress Notes (Addendum)
Occupational Therapy Treatment Patient Details Name: Audrey Mullen MRN: [id number] DOB: Feb 01, 1941 Today's Date: 12/07/2016    History of present illness 76 y.o. admitted for CABG with expressive aphasia and Right weakness post op CT (-). PMHx: Afib, HTN, HLD, DM, nephrolithiasis   OT comments  Pt progressing. Education provided in session. Feel pt will continue to benefit from acute OT prior to d/c.  Follow Up Recommendations  Home health OT;Supervision/Assistance - 24 hour    Equipment Recommendations  None recommended by OT    Recommendations for Other Services      Precautions / Restrictions Precautions Precautions: Fall;Sternal Precaution Comments: Reviewed sternal precautions Restrictions Weight Bearing Restrictions: Yes Other Position/Activity Restrictions: sternal precautions       Mobility Bed Mobility               General bed mobility comments: not assessed.  Transfers Overall transfer level: Needs assistance Equipment used: Rolling walker (2 wheeled) Transfers: Sit to/from Stand Sit to Stand: Supervision              Balance                                   ADL Overall ADL's : Needs assistance/impaired     Grooming: Wash/dry face;Set up;Supervision/safety;Standing (dryed off hands)   Upper Body Bathing: Supervision/ safety;Set up;Standing Upper Body Bathing Details (indicate cue type and reason): washed armpits Lower Body Bathing: Min guard (standing) Lower Body Bathing Details (indicate cue type and reason): washed peri area Upper Body Dressing : Standing Upper Body Dressing Details (indicate cue type and reason): OT assisted with gown so pt could wash under arms; Pt able to put gown back over shoulders but OT tied gown Lower Body Dressing: Supervision/safety;Sitting/lateral leans Lower Body Dressing Details (indicate cue type and reason): donned/doffed socks Toilet Transfer: Min Air cabin crew- Clothing Manipulation and Hygiene: Sit to/from stand;Supervision/safety       Functional mobility during ADLs: Min guard;Rolling walker General ADL Comments: Talked about sternal precautions. Educated on use of bag on walker. Recommended someone be with pt for shower transfer-she states it is a level entrance in her shower and that walker will fit. Cues for sternal precautions.      Vision                     Perception     Praxis      Cognition  Awake/Alert Behavior During Therapy: WFL for tasks assessed/performed Overall Cognitive Status: Within Functional Limits for tasks assessed                       Extremity/Trunk Assessment               Exercises     Shoulder Instructions       General Comments      Pertinent Vitals/ Pain       Pain Assessment: No/denies pain  Home Living                                          Prior Functioning/Environment              Frequency  Min 2X/week        Progress Toward Goals  OT Goals(current goals can now  be found in the care plan section)  Progress towards OT goals: Progressing toward goals  Acute Rehab OT Goals Patient Stated Goal: not stated OT Goal Formulation: With patient Time For Goal Achievement: 12/16/16 Potential to Achieve Goals: Good ADL Goals Pt Will Perform Grooming: with supervision;standing Pt Will Perform Upper Body Bathing: with supervision;sitting Pt Will Perform Lower Body Bathing: with supervision;sit to/from stand Pt Will Perform Upper Body Dressing: with supervision;sitting Pt Will Perform Lower Body Dressing: with supervision;sit to/from stand Pt Will Transfer to Toilet: with supervision;ambulating;regular height toilet Pt Will Perform Toileting - Clothing Manipulation and hygiene: with supervision;sit to/from stand (met goal) Additional ADL Goal #1: Pt will independently verbally recall sternal precautions and maintain  throughout ADL.  Plan Discharge plan remains appropriate    Co-evaluation                 End of Session Equipment Utilized During Treatment: Gait belt;Rolling walker   Activity Tolerance Patient tolerated treatment well   Patient Left in chair;with call bell/phone within reach   Nurse Communication Mobility status        Time: 9201-0071 OT Time Calculation (min): 14 min  Charges: OT General Charges $OT Visit: 1 Procedure OT Treatments $Self Care/Home Management : 8-22 mins  Bruce Mayers L OTR/L 12/07/2016, 10:27 AM

## 2016-12-07 NOTE — Progress Notes (Signed)
CARDIAC REHAB PHASE I   PRE:  Rate/Rhythm: 79 nsr   BP:  Sitting: 126/56      SaO2: 96 ra  MODE:  Ambulation: 400 ft   POST:  Rate/Rhythm: 84 nsr  BP:  Sitting: 120/70     SaO2: 96 ra 1135-1234 Patient ambulated in hallway x 1 assist with RW. Denied complaints. Steady gait noted. Post ambulation patient back to chair with call bell in reach. Husband and son at bedside. Discharge education completed with teach back noted. Cardiac surgery book reviewed in detail and family encouraged to view discharge video. Patient encouraged to ambulate 2 more times today. At previous cardiac rehab visit patient agreed to and order was placed for phase 2 cardiac rehab. Patient also being followed by case manager, speech, PT, and OT. Will follow up Monday if patient not discharged.  Erandy Mceachern English PayneRN, BSN 12/07/2016 12:47 PM

## 2016-12-08 LAB — GLUCOSE, CAPILLARY
GLUCOSE-CAPILLARY: 178 mg/dL — AB (ref 65–99)
Glucose-Capillary: 135 mg/dL — ABNORMAL HIGH (ref 65–99)
Glucose-Capillary: 150 mg/dL — ABNORMAL HIGH (ref 65–99)
Glucose-Capillary: 132 mg/dL — ABNORMAL HIGH (ref 65–99)

## 2016-12-08 LAB — PROTIME-INR
INR: 1.88
PROTHROMBIN TIME: 21.8 s — AB (ref 11.4–15.2)

## 2016-12-08 MED ORDER — WARFARIN SODIUM 5 MG PO TABS
5.0000 mg | ORAL_TABLET | Freq: Every day | ORAL | Status: DC
  Administered 2016-12-08: 5 mg via ORAL
  Filled 2016-12-08: qty 1

## 2016-12-08 NOTE — Progress Notes (Addendum)
      [address]       [place] [address]             [phone]        10 Days Post-Op Procedure(s) (LRB): CORONARY ARTERY BYPASS GRAFTING (CABG) x 4 (N/A) MAZE (N/A) TRANSESOPHAGEAL ECHOCARDIOGRAM (TEE) (N/A) ENDOVEIN HARVEST OF GREATER SAPHENOUS VEIN (Right)  Subjective: Patient wants to go home. She does not like hospital food.  Objective: Vital signs in last 24 hours: Temp:  [97.3 F (36.3 C)-98.7 F (37.1 C)] 97.3 F (36.3 C) (01/21 0442) Pulse Rate:  [78-86] 83 (01/21 0830) Cardiac Rhythm: Normal sinus rhythm (01/21 0701) Resp:  [18] 18 (01/21 0442) BP: (107-154)/(58-76) 109/67 (01/21 0849) SpO2:  [96 %-98 %] 96 % (01/21 0442) Weight:  [154 lb 1.6 oz (69.9 kg)] 154 lb 1.6 oz (69.9 kg) (01/21 0442)  Pre op weight 70 kg Current Weight  12/08/16 154 lb 1.6 oz (69.9 kg)       Intake/Output from previous day: 01/20 0701 - 01/21 0700 In: 200 [P.O.:200] Out: 1050 [Urine:1050]   Physical Exam:  Cardiovascular: RRR Pulmonary: Clear to auscultation bilaterally; no rales, wheezes, or rhonchi. Abdomen: Soft, non tender, bowel sounds present. Extremities: Mild bilateral lower extremity edema. Wounds: Clean and dry.  No erythema or signs of infection.  Lab Results: CBC:No results for input(s): WBC, HGB, HCT, PLT in the last 72 hours. BMET:  Recent Labs  12/06/16 0459  CREATININE 1.21*    PT/INR:  Lab Results  Component Value Date   INR 1.88 12/08/2016   INR 1.75 12/07/2016   INR 1.50 12/06/2016   ABG:  INR: Will add last result for INR, ABG once components are confirmed Will add last 4 CBG results once components are confirmed  Assessment/Plan:  1. CV - History of PAF. Maintaining SR in the 70-80's. On Coreg 6.25 mg bid, Cardizem CD 120 mg daily, and Coumadin. INR increased from 1.75 to 1.88. Will decrease Coumadin to 5 mg tonight. She is on full dose ecasa-will defter to Dr. Roxan Hockey if should decrease as is on Coumadin. 2.   Pulmonary - On room air. Encourage incentive spirometer. 3. CBGs 210/235/135. Glucose likely higher as on Prednisone. Pre op HGA1C 7.6. She does not appear to have been on diabetic medicine or Insulin prior to surgery.  Needs to follow up with her medical doctor after discharge. 4. Neurologic-s/p CVA. Mild expressive aphasia 5. Will discharge in am with PT/OT  ZIMMERMAN,DONIELLE MPA-C 12/08/2016,9:39 AM   I have seen and examined the patient and agree with the assessment and plan as outlined.  Rexene Alberts, MD 12/08/2016 11:49 AM

## 2016-12-09 ENCOUNTER — Inpatient Hospital Stay (HOSPITAL_COMMUNITY): Payer: Medicare Other

## 2016-12-09 LAB — GLUCOSE, CAPILLARY
Glucose-Capillary: 111 mg/dL — ABNORMAL HIGH (ref 65–99)
Glucose-Capillary: 177 mg/dL — ABNORMAL HIGH (ref 65–99)
Glucose-Capillary: 262 mg/dL — ABNORMAL HIGH (ref 65–99)

## 2016-12-09 LAB — PROTIME-INR
INR: 1.86
Prothrombin Time: 21.7 seconds — ABNORMAL HIGH (ref 11.4–15.2)

## 2016-12-09 MED ORDER — LIVING WELL WITH DIABETES BOOK
Freq: Once | Status: DC
  Filled 2016-12-09: qty 1

## 2016-12-09 MED ORDER — WARFARIN SODIUM 5 MG PO TABS: 5.0000 mg | ORAL_TABLET | Freq: Every day | ORAL | 1 refills | Status: DC

## 2016-12-09 MED ORDER — TRAMADOL HCL 50 MG PO TABS: 50.0000 mg | ORAL_TABLET | Freq: Four times a day (QID) | ORAL | 0 refills | Status: DC | PRN

## 2016-12-09 MED ORDER — DILTIAZEM HCL ER COATED BEADS 120 MG PO CP24: 120.0000 mg | ORAL_CAPSULE | Freq: Every day | ORAL | 1 refills | Status: DC

## 2016-12-09 MED ORDER — CARVEDILOL 6.25 MG PO TABS: 6.2500 mg | ORAL_TABLET | Freq: Two times a day (BID) | ORAL | 1 refills | Status: DC

## 2016-12-09 MED ORDER — RESOURCE THICKENUP CLEAR PO POWD: ORAL | 1 refills | Status: DC

## 2016-12-09 NOTE — Care Management Important Message (Signed)
Important Message  Patient Details  Name: Audrey Mullen MRN: [id number] Date of Birth: August 29, 1941   Medicare Important Message Given:  Yes    [name] May 12/09/2016, 1:42 PM

## 2016-12-09 NOTE — Progress Notes (Signed)
Occupational Therapy Treatment Patient Details Name: JACKYE MULVEY MRN: [id number] DOB: 12/07/1940 Today's Date: 12/09/2016    History of present illness 76 y.o. admitted for CABG with expressive aphasia and Right weakness post op CT (-). PMHx: Afib, HTN, HLD, DM, nephrolithiasis   OT comments  "I'm ready to go !". Completed education regarding sternal precautions and ADL and functional mobility for ADL. Pt making excellent progress. Continue to recommend follow up [place] to facilitate return to PLOF.   Follow Up Recommendations  Home health OT;Supervision/Assistance - 24 hour    Equipment Recommendations  None recommended by OT    Recommendations for Other Services      Precautions / Restrictions Precautions Precautions: Fall;Sternal Restrictions Other Position/Activity Restrictions: sternal precautions       Mobility Bed Mobility               General bed mobility comments: OOB in chair  Transfers Overall transfer level: Modified independent               General transfer comment: good technique and hand placement    Balance             Standing balance-Leahy Scale: Fair                     ADL                                       Functional mobility during ADLs: Supervision/safety General ADL Comments: Reviewed sternal precautions with pt and family. ansered questions regarding how she could be more independent and how family could safely assist her. Recommend ptuse shower seat at this time. Family verblaized understanding. Discussed slowly increasing activity level and working with HHOT/PT to facilitate return to PLOF.       Vision  glasses                   Perception  appears intact   Praxis      Cognition   Behavior During Therapy: WFL for tasks assessed/performed Overall Cognitive Status: Within Functional Limits for tasks assessed (appears [facility]. will further assess in home environment)                          Extremity/Trunk Assessment   RUE minimally weaker than LUE but funcitonal            Exercises     Shoulder Instructions       General Comments  Educated on home set up and reducing risk of falls. Pt/family verbalized understnading.    Pertinent Vitals/ Pain       Pain Assessment: No/denies pain  Home Living                                          Prior Functioning/Environment              Frequency  Min 2X/week        Progress Toward Goals  OT Goals(current goals can now be found in the care plan section)  Progress towards OT goals: Progressing toward goals  Acute Rehab OT Goals Patient Stated Goal: to go home OT Goal Formulation: With patient Time For Goal Achievement: 12/16/16 Potential to Achieve Goals: Good ADL Goals  Pt Will Perform Grooming: with supervision;standing Pt Will Perform Upper Body Bathing: with supervision;sitting Pt Will Perform Lower Body Bathing: with supervision;sit to/from stand Pt Will Perform Upper Body Dressing: with supervision;sitting Pt Will Perform Lower Body Dressing: with supervision;sit to/from stand Pt Will Transfer to Toilet: with supervision;ambulating;regular height toilet Pt Will Perform Toileting - Clothing Manipulation and hygiene: with supervision;sit to/from stand Additional ADL Goal #1: Pt will independently verbally recall sternal precautions and maintain throughout ADL.  Plan Discharge plan remains appropriate    Co-evaluation                 End of Session     Activity Tolerance Patient tolerated treatment well   Patient Left in chair;with call bell/phone within reach;with family/visitor present   Nurse Communication Mobility status        Time: 1545-1600 OT Time Calculation (min): 15 min  Charges: OT General Charges $OT Visit: 1 Procedure OT Treatments $Self Care/Home Management : 8-22 mins  Kamil Mchaffie,HILLARY 12/09/2016, 4:11 PM

## 2016-12-09 NOTE — Progress Notes (Signed)
Inpatient Diabetes Program Recommendations  AACE/ADA: New Consensus Statement on Inpatient Glycemic Control (2015)  Target Ranges:  Prepandial:   less than 140 mg/dL      Peak postprandial:   less than 180 mg/dL (1-2 hours)      Critically ill patients:  140 - 180 mg/dL   Lab Results  Component Value Date   GLUCAP 111 (H) 12/09/2016   HGBA1C 7.6 (H) 11/28/2016   Review of Glycemic Control  Diabetes history: Newly diagnosed this admission   Spoke with patient about new diabetes diagnosis.  Discussed A1C results (7.6% on 11/28/16) and explained what an A1C is and informed patient that his current A1C indicates an average glucose of 240 mg/dl over the past 2-3 months. Discussed basic pathophysiology of DM Type 2, basic home care, importance of checking CBGs and maintaining good CBG control to prevent long-term and short-term complications. Reviewed glucose and A1C goals. Reviewed signs and symptoms of hyperglycemia and hypoglycemia along with treatment for both. Discussed impact of nutrition, exercise, stress, sickness, and medications on diabetes control.  Asked patient to check her glucose twice a day (before breakfast and alternating second check). Reviewed side effects of Glucophage and to take with food. Informed patient to practice checking her blood sugar while here. Patient verbalized understanding of information discussed.  Patient to follow up with her doctor every 3 months to check on A1c level. RNs to provide ongoing basic DM education at bedside with this patient and engage patient to actively check blood glucose.   At time of discharge patient will need  Glucose meter kit (order # [id number])   Thanks, Tama Headings RN, MSN, [facility] Inpatient Diabetes Coordinator Team Pager [phone] (8a-5p)

## 2016-12-09 NOTE — Progress Notes (Signed)
CARDIAC REHAB PHASE I   PRE:  Rate/Rhythm: 85 SR  BP:  Supine: 127/68  Sitting:   Standing:    SaO2: 97%RA  MODE:  Ambulation: 380 ft   POST:  Rate/Rhythm: 100 SR  BP:  Supine:   Sitting: 148/76  Standing:    SaO2: 100%RA 0821-0840 Pt walked 380 ft on RA with rolling walker with steady gait. Tolerated well. Has rolling walker in room for home. To recliner with call bell.   Graylon Good, RN BSN  12/09/2016 8:36 AM

## 2016-12-09 NOTE — Progress Notes (Addendum)
[address]       [place] [address]             [phone]      11 Days Post-Op Procedure(s) (LRB): CORONARY ARTERY BYPASS GRAFTING (CABG) x 4 (N/A) MAZE (N/A) TRANSESOPHAGEAL ECHOCARDIOGRAM (TEE) (N/A) ENDOVEIN HARVEST OF GREATER SAPHENOUS VEIN (Right) Subjective: Says she feels well  Objective: Vital signs in last 24 hours: Temp:  [98 F (36.7 C)-98.8 F (37.1 C)] 98.3 F (36.8 C) (01/22 0400) Pulse Rate:  [77-86] 86 (01/22 0400) Cardiac Rhythm: Normal sinus rhythm (01/21 1933) Resp:  [18-20] 20 (01/22 0400) BP: (97-135)/(54-77) 135/77 (01/22 0400) SpO2:  [96 %-97 %] 96 % (01/22 0400) Weight:  [153 lb 11.2 oz (69.7 kg)] 153 lb 11.2 oz (69.7 kg) (01/22 0400)  Hemodynamic parameters for last 24 hours:    Intake/Output from previous day: 01/21 0701 - 01/22 0700 In: 600 [P.O.:600] Out: 1110 [Urine:1110] Intake/Output this shift: No intake/output data recorded.  General appearance: alert, cooperative and no distress Heart: regular rate and rhythm Lungs: mildly dim in left base Abdomen: benign Extremities: no sign edema Wound: incis healing well  Lab Results: No results for input(s): WBC, HGB, HCT, PLT in the last 72 hours. BMET: No results for input(s): NA, K, CL, CO2, GLUCOSE, BUN, CREATININE, CALCIUM in the last 72 hours.  PT/INR:  Recent Labs  12/09/16 0309  LABPROT 21.7*  INR 1.86   ABG    Component Value Date/Time   PHART 7.378 11/29/2016 0707   HCO3 22.2 11/29/2016 0707   TCO2 24 11/29/2016 1730   ACIDBASEDEF 3.0 (H) 11/29/2016 0707   O2SAT 98.0 11/29/2016 0707   CBG (last 3)   Recent Labs  12/08/16 1630 12/08/16 2102 12/09/16 0617  GLUCAP 178* 132* 111*    Meds Scheduled Meds: . aspirin  325 mg Oral Daily  . carvedilol  6.25 mg Oral BID WC  . diltiazem  120 mg Oral Daily  . enoxaparin (LOVENOX) injection  40 mg Subcutaneous QHS  . famotidine  20 mg Oral Daily  . feeding supplement (GLUCERNA SHAKE)  237 mL  Oral TID WC  . insulin aspart  0-15 Units Subcutaneous TID WC  . latanoprost  1 drop Both Eyes QHS  . mouth rinse  15 mL Mouth Rinse BID  . moving right along book   Does not apply Once  . predniSONE  7.5 mg Oral Q breakfast  . rosuvastatin  20 mg Oral q1800  . sodium chloride flush  3 mL Intravenous Q12H  . warfarin  5 mg Oral q1800  . Warfarin - Physician Dosing Inpatient   Does not apply q1800   Continuous Infusions: PRN Meds:.sodium chloride, acetaminophen, alum & mag hydroxide-simeth, magnesium hydroxide, metoprolol, ondansetron (ZOFRAN) IV, RESOURCE THICKENUP CLEAR, sodium chloride flush, traMADol  Xrays No results found.  Assessment/Plan: S/P Procedure(s) (LRB): CORONARY ARTERY BYPASS GRAFTING (CABG) x 4 (N/A) MAZE (N/A) TRANSESOPHAGEAL ECHOCARDIOGRAM (TEE) (N/A) ENDOVEIN HARVEST OF GREATER SAPHENOUS VEIN (Right)  1 doing well  2 sugars somewhat controlled , but she has no idea that she is a diabetic or how to manage- will ask diabetic coordinator to see. A1C 7.6, may benefit from glucophage but would need teaching to check sugars 3 cont coumadin 4 voice improving 5 poss home later today or tomorrow  LOS: 14 days    GOLD,WAYNE E 12/09/2016 Speech continues to improve. More multiple word answers today but she does start slurring as her pace quickens.  She is ready to go home today Will need a glucometer at dc and follow up with primary MD  Remo Lipps C. Roxan Hockey, MD [facility] [phone]

## 2016-12-09 NOTE — Discharge Instructions (Signed)
Coronary Artery Bypass Grafting, Care After These instructions give you information on caring for yourself after your procedure. Your doctor may also give you more specific instructions. Call your doctor if you have any problems or questions after your procedure. Follow these instructions at home:  Only take medicine as told by your doctor. Take medicines exactly as told. Do not stop taking medicines or start any new medicines without talking to your doctor first.  Take your pulse as told by your doctor. Do deep breathing as told by your doctor. Use  This information is not intended to replace advice given to you by your health care provider. Make sure you discuss any questions you have with your health care provider. Document Released: 11/07/2003 Document Revised: 05/24/2016 Document Reviewed: 04/15/2016 Elsevier Interactive Patient Education  2017 [employer].  your breathing device (incentive spirometer), if given, to practice deep breathing several times a day. Support your chest with a pillow or your arms when you take deep breaths or cough.  Keep the area clean, dry, and protected where the surgery cuts (incisions) were made. Remove bandages (dressings) only as told by your doctor. If strips were applied to surgical area, do not take them off. They fall off on their own.  Check the surgery area daily for puffiness (swelling), redness, or leaking fluid.  If surgery cuts were made in your legs:  Avoid crossing your legs.  Avoid sitting for long periods of time. Change positions every 30 minutes.  Raise your legs when you are sitting. Place them on pillows.  Wear stockings that help keep blood clots from forming in your legs (compression stockings).  Only take sponge baths until your doctor says it is okay to take showers. Pat the surgery area dry. Do not rub the surgery area with a washcloth or towel. Do not bathe, swim, or use a hot tub until your doctor says it is okay.  Eat  foods that are high in fiber. These include raw fruits and vegetables, whole grains, beans, and nuts. Choose lean meats. Avoid canned, processed, and fried foods.  Drink enough fluids to keep your pee (urine) clear or pale yellow.  Weigh yourself every day.  Rest and limit activity as told by your doctor. You may be told to:  Stop any activity if you have chest pain, shortness of breath, changes in heartbeat, or dizziness. Get help right away if this happens.  Move around often for short amounts of time or take short walks as told by your doctor. Gradually become more active. You may need help to strengthen your muscles and build endurance.  Avoid lifting, pushing, or pulling anything heavier than 10 pounds (4.5 kg) for at least 6 weeks after surgery.  Do not drive until your doctor says it is okay.  Ask your doctor when you can go back to work.  Ask your doctor when you can begin sexual activity again.  Follow up with your doctor as told. Contact a doctor if:  You have puffiness, redness, more pain, or fluid draining from the incision site.  You have a fever.  You have puffiness in your ankles or legs.  You have pain in your legs.  You gain 2 or more pounds (0.9 kg) a day.  You feel sick to your stomach (nauseous) or throw up (vomit).  You have watery poop (diarrhea). Get help right away if:  You have chest pain that goes to your jaw or arms.  You have shortness of breath.  You have a fast or irregular heartbeat.  You notice a "clicking" in your breastbone when you move.  You have numbness or weakness in your arms or legs.  You feel dizzy or light-headed. This information is not intended to replace advice given to you by your health care provider. Make sure you discuss any questions you have with your health care provider. Document Released: 11/09/2013 Document Revised: 04/11/2016 Document Reviewed: 04/13/2013 Elsevier Interactive Patient Education  2017 [place].  Type 2 Diabetes Mellitus, Self Care, Adult When you have type 2 diabetes (type 2 diabetes mellitus), you must keep your blood sugar (glucose) under control. You can do this with: Nutrition. Exercise. Lifestyle changes. Medicines or insulin, if needed. Support from your doctors and others. How do I manage my blood sugar? Check your blood sugar level every day, as often as told. Call your doctor if your blood sugar is above your goal numbers for 2 tests in a row. Have your A1c (hemoglobin A1c) level checked at least two times a year. Have it checked more often if your doctor tells you to. Your doctor will set treatment goals for you. Generally, you should have these blood sugar levels: Before meals (preprandial): 80-130 mg/dL (4.4-7.2 mmol/L). After meals (postprandial): lower than 180 mg/dL (10 mmol/L). A1c level: less than 7%. What do I need to know about high blood sugar? High blood sugar is called hyperglycemia. Know the signs of high blood sugar. Signs may include: Feeling: Thirsty. Hungry. Very tired. Needing to pee (urinate) more than usual. Blurry vision. What do I need to know about low blood sugar? Low blood sugar is called hypoglycemia. This is when blood sugar is at or below 70 mg/dL (3.9 mmol/L). Symptoms may include: Feeling: Hungry. Worried or nervous (anxious). Sweaty and clammy. Confused. Dizzy. Sleepy. Sick to your stomach (nauseous). Having: A fast heartbeat (palpitations). A headache. A change in your vision. Jerky movements that you cannot control (seizure). Nightmares. Tingling or no feeling (numbness) around the mouth, lips, or tongue. Having trouble with: Talking. Paying attention (concentrating). Moving (coordination). Sleeping. Shaking. Passing out (fainting). Getting upset easily (irritability). Treating low blood sugar  To treat low blood sugar, eat or drink something sugary right away. If you can think clearly and swallow safely,  follow the 15:15 rule: Take 15 grams of a fast-acting carb (carbohydrate). Some fast-acting carbs are: 1 tube of glucose gel. 3 sugar tablets (glucose pills). 6-8 pieces of hard candy. 4 oz (120 mL) of fruit juice. 4 oz (120 mL) regular (not diet) soda. Check your blood sugar 15 minutes after you take the carb. If your blood sugar is still at or below 70 mg/dL (3.9 mmol/L), take 15 grams of a carb again. If your blood sugar does not go above 70 mg/dL (3.9 mmol/L) after 3 tries, get help right away. After your blood sugar goes back to normal, eat a meal or a snack within 1 hour. Treating very low blood sugar  If your blood sugar is at or below 54 mg/dL (3 mmol/L), you have very low blood sugar (severe hypoglycemia). This is an emergency. Do not wait to see if the symptoms will go away. Get medical help right away. Call your local emergency services (911 in the [place] Do not drive yourself to the hospital. If you have very low blood sugar and you cannot eat or drink, you may need a glucagon shot (injection). A family member or friend should learn how to check your blood sugar and how  to give you a glucagon shot. Ask your doctor if you need to have a glucagon shot kit at home. What else is important to manage my diabetes? Medicine  Follow these instructions about insulin and diabetes medicines: Take them as told by your doctor. Adjust them as told by your doctor. Do not run out of them. Having diabetes can raise your risk for other long-term conditions. These include heart or kidney disease. Your doctor may prescribe medicines to help prevent problems from diabetes. Food  Make healthy food choices. These include: Chicken, fish, egg whites, and beans. Oats, whole wheat, bulgur, brown rice, quinoa, and millet. Fresh fruits and vegetables. Low-fat dairy products. Nuts, avocado, olive oil, and canola oil. Make a food plan with a specialist (dietitian). Follow instructions from your doctor about  what you cannot eat or drink. Drink enough fluid to keep your pee (urine) clear or pale yellow. Eat healthy snacks between healthy meals. Keep track of carbs that you eat. Read food labels. Learn food serving sizes. Follow your sick day plan when you cannot eat or drink normally. Make this plan with your doctor so it is ready to use. Activity Exercise at least 3 times a week. Do not go more than 2 days without exercising. Talk with your doctor before you start a new exercise. Your doctor may need to adjust your insulin, medicines, or food. Lifestyle  Do not use any tobacco products. These include cigarettes, chewing tobacco, and e-cigarettes.If you need help quitting, ask your doctor. Ask your doctor how much alcohol is safe for you. Learn to deal with stress. If you need help with this, ask your doctor. Body care Stay up to date with your shots (immunizations). Have your eyes and feet checked by a doctor as often as told. Check your skin and feet every day. Check for cuts, bruises, redness, blisters, or sores. Brush your teeth and gums two times a day. Floss at least one time a day. Go to the dentist least one time every 6 months. Stay at a healthy weight. General instructions  Take over-the-counter and prescription medicines only as told by your doctor. Share your diabetes care plan with: Your work or school. People you live with. Check your pee (urine) for ketones: When you are sick. As told by your doctor. Carry a card or wear jewelry that says that you have diabetes. Ask your doctor: Do I need to meet with a diabetes educator? Where can I find a support group for people with diabetes? Keep all follow-up visits as told by your doctor. This is important. Where to find more information: To learn more about diabetes, visit: American Diabetes Association: www.diabetes.org American Association of Diabetes Educators: www.diabeteseducator.org/patient-resources This information is  not intended to replace advice given to you by your health care provider. Make sure you discuss any questions you have with your health care provider. Document Released: 02/26/2016 Document Revised: 04/11/2016 Document Reviewed: 12/08/2015 Elsevier Interactive Patient Education  2017 [place].  Blood Glucose Monitoring, Adult Monitoring your blood sugar (glucose) helps you manage your diabetes. It also helps you and your health care provider determine how well your diabetes management plan is working. Blood glucose monitoring involves checking your blood glucose as often as directed, and keeping a record (log) of your results over time. Why should I monitor my blood glucose? Checking your blood glucose regularly can:  Help you understand how food, exercise, illnesses, and medicines affect your blood glucose.  Let you know what your blood glucose  is at any time. You can quickly tell if you are having low blood glucose (hypoglycemia) or high blood glucose (hyperglycemia).  Help you and your health care provider adjust your medicines as needed. When should I check my blood glucose? Follow instructions from your health care provider about how often to check your blood glucose. This may depend on:  The type of diabetes you have.  How well-controlled your diabetes is.  Medicines you are taking. If you have type 1 diabetes:  Check your blood glucose at least 2 times a day.  Also check your blood glucose:  Before every insulin injection.  Before and after exercise.  Between meals.  2 hours after a meal.  Occasionally between 2:00 a.m. and 3:00 a.m., as directed.  Before potentially dangerous tasks, like driving or using heavy machinery.  At bedtime.  You may need to check your blood glucose more often, up to 6-10 times a day:  If you use an insulin pump.  If you need multiple daily injections (MDI).  If your diabetes is not well-controlled.  If you are ill.  If you  have a history of severe hypoglycemia.  If you have a history of not knowing when your blood glucose is getting low (hypoglycemia unawareness). If you have type 2 diabetes:  If you take insulin or other diabetes medicines, check your blood glucose at least 2 times a day.  If you are on intensive insulin therapy, check your blood glucose at least 4 times a day. Occasionally, you may also need to check between 2:00 a.m. and 3:00 a.m., as directed.  Also check your blood glucose:  Before and after exercise.  Before potentially dangerous tasks, like driving or using heavy machinery.  You may need to check your blood glucose more often if:  Your medicine is being adjusted.  Your diabetes is not well-controlled.  You are ill. What is a blood glucose log?  A blood glucose log is a record of your blood glucose readings. It helps you and your health care provider:  Look for patterns in your blood glucose over time.  Adjust your diabetes management plan as needed.  Every time you check your blood glucose, write down your result and notes about things that may be affecting your blood glucose, such as your diet and exercise for the day.  Most glucose meters store a record of glucose readings in the meter. Some meters allow you to download your records to a computer. How do I check my blood glucose? Follow these steps to get accurate readings of your blood glucose: Supplies needed   Blood glucose meter.  Test strips for your meter. Each meter has its own strips. You must use the strips that come with your meter.  A needle to prick your finger (lancet). Do not use lancets more than once.  A device that holds the lancet (lancing device).  A journal or log book to write down your results. Procedure  Wash your hands with soap and water.  Prick the side of your finger (not the tip) with the lancet. Use a different finger each time.  Gently rub the finger until a small drop of blood  appears.  Follow instructions that come with your meter for inserting the test strip, applying blood to the strip, and using your blood glucose meter.  Write down your result and any notes. Alternative testing sites  Some meters allow you to use areas of your body other than your finger (alternative sites)  to test your blood.  If you think you may have hypoglycemia, or if you have hypoglycemia unawareness, do not use alternative sites. Use your finger instead.  Alternative sites may not be as accurate as the fingers, because blood flow is slower in these areas. This means that the result you get may be delayed, and it may be different from the result that you would get from your finger.  The most common alternative sites are:  Forearm.  Thigh.  Palm of the hand. Additional tips  Always keep your supplies with you.  If you have questions or need help, all blood glucose meters have a 24-hour hotline number that you can call. You may also contact your health care provider.  After you use a few boxes of test strips, adjust (calibrate) your blood glucose meter by following instructions that came with your meter. This information is not intended to replace advice given to you by your health care provider. Make sure you discuss any questions you have with your health care provider. Document Released: 11/07/2003 Document Revised: 05/24/2016 Document Reviewed: 04/15/2016 Elsevier Interactive Patient Education  2017 [employer].

## 2016-12-09 NOTE — Progress Notes (Signed)
Modified Barium Swallow Progress Note  Patient Details  Name: Audrey Mullen MRN: [id number] Date of Birth: 1941-03-03  Today's Date: 12/09/2016  Modified Barium Swallow completed.  Full report located under Chart Review in the Imaging Section.  Brief recommendations include the following:  Clinical Impression  Swallowing function largely [facility] with only trace intermittent flash penetration of thin liquids noted when consumed via large, consecutive straw sips (normal for age). Otherwise, full airway protection noted. Pharyngeal strength intact. Patient may advance to a regular diet, thin liquids.    Swallow Evaluation Recommendations       SLP Diet Recommendations: Regular solids;Thin liquid   Liquid Administration via: Cup;Straw   Medication Administration: Whole meds with liquid   Supervision: Patient able to self feed;Intermittent supervision to cue for compensatory strategies   Compensations: Slow rate;Small sips/bites   Postural Changes: Seated upright at 90 degrees   Oral Care Recommendations: Oral care BID      Gabriel Rainwater MA, CCC-SLP [phone]   [name] Meryl 12/09/2016,10:06 AM

## 2016-12-09 NOTE — Progress Notes (Signed)
Pt. Discharged to home with Husband an son Pt. D/C'd via wheelchair with NT Discharge information reviewed and given along with Rx All personal belongings given to Pt. Including rolling walker for home Education discussed withe teach back from pt. And family Removed CT sutures per order  IV was d/c Tele d/c

## 2016-12-09 NOTE — Progress Notes (Signed)
PT Cancellation Note  Patient Details Name: Audrey Mullen MRN: [id number] DOB: Jun 01, 1941   Cancelled Treatment:    Reason Eval/Treat Not Completed: Patient at procedure or test/unavailable. Pt off floor for MBS. PT to re-attempt as time allows.   Lorriane Shire 12/09/2016, 9:56 AM

## 2016-12-10 ENCOUNTER — Ambulatory Visit: Payer: Medicare Other | Admitting: Physician Assistant

## 2016-12-10 NOTE — Consult Note (Signed)
            [facility] CM Primary Care Navigator  12/10/2016  [place] 1941/03/09 [id number]    Went to see patient at the bedside to identify possible discharge needsbut she was already discharged to home per staff .  Primary care provider's office called Sula Soda) to notify of patient's discharge and need for post hospital follow-up and transition of care.  Also made aware to refer patient to [facility] care management for care coordination needs if deemed appropriate for services.  For additional questions please contact:  Edwena Felty A. Azalia Neuberger, BSN, RN-BC [facility] PRIMARY CARE Navigator Cell: [phone]

## 2016-12-11 ENCOUNTER — Ambulatory Visit: Payer: Medicare Other | Admitting: Physician Assistant

## 2016-12-11 DIAGNOSIS — Z8673 Personal history of transient ischemic attack (TIA), and cerebral infarction without residual deficits: Secondary | ICD-10-CM | POA: Diagnosis not present

## 2016-12-11 DIAGNOSIS — Z48812 Encounter for surgical aftercare following surgery on the circulatory system: Secondary | ICD-10-CM | POA: Diagnosis not present

## 2016-12-11 DIAGNOSIS — E119 Type 2 diabetes mellitus without complications: Secondary | ICD-10-CM | POA: Diagnosis not present

## 2016-12-11 DIAGNOSIS — I10 Essential (primary) hypertension: Secondary | ICD-10-CM | POA: Diagnosis not present

## 2016-12-11 DIAGNOSIS — I480 Paroxysmal atrial fibrillation: Secondary | ICD-10-CM | POA: Diagnosis not present

## 2016-12-11 DIAGNOSIS — I25119 Atherosclerotic heart disease of native coronary artery with unspecified angina pectoris: Secondary | ICD-10-CM | POA: Diagnosis not present

## 2016-12-12 DIAGNOSIS — I6930 Unspecified sequelae of cerebral infarction: Secondary | ICD-10-CM | POA: Diagnosis not present

## 2016-12-12 DIAGNOSIS — I10 Essential (primary) hypertension: Secondary | ICD-10-CM | POA: Diagnosis not present

## 2016-12-12 DIAGNOSIS — I4891 Unspecified atrial fibrillation: Secondary | ICD-10-CM | POA: Diagnosis not present

## 2016-12-13 DIAGNOSIS — I10 Essential (primary) hypertension: Secondary | ICD-10-CM | POA: Diagnosis not present

## 2016-12-13 DIAGNOSIS — Z48812 Encounter for surgical aftercare following surgery on the circulatory system: Secondary | ICD-10-CM | POA: Diagnosis not present

## 2016-12-13 DIAGNOSIS — I25119 Atherosclerotic heart disease of native coronary artery with unspecified angina pectoris: Secondary | ICD-10-CM | POA: Diagnosis not present

## 2016-12-13 DIAGNOSIS — Z8673 Personal history of transient ischemic attack (TIA), and cerebral infarction without residual deficits: Secondary | ICD-10-CM | POA: Diagnosis not present

## 2016-12-13 DIAGNOSIS — E119 Type 2 diabetes mellitus without complications: Secondary | ICD-10-CM | POA: Diagnosis not present

## 2016-12-13 DIAGNOSIS — I480 Paroxysmal atrial fibrillation: Secondary | ICD-10-CM | POA: Diagnosis not present

## 2016-12-15 ENCOUNTER — Other Ambulatory Visit: Payer: Self-pay | Admitting: Cardiovascular Disease

## 2016-12-16 ENCOUNTER — Ambulatory Visit (INDEPENDENT_AMBULATORY_CARE_PROVIDER_SITE_OTHER): Payer: Medicare Other | Admitting: *Deleted

## 2016-12-16 DIAGNOSIS — I25119 Atherosclerotic heart disease of native coronary artery with unspecified angina pectoris: Secondary | ICD-10-CM | POA: Diagnosis not present

## 2016-12-16 DIAGNOSIS — E119 Type 2 diabetes mellitus without complications: Secondary | ICD-10-CM | POA: Diagnosis not present

## 2016-12-16 DIAGNOSIS — I4891 Unspecified atrial fibrillation: Secondary | ICD-10-CM

## 2016-12-16 DIAGNOSIS — I480 Paroxysmal atrial fibrillation: Secondary | ICD-10-CM | POA: Diagnosis not present

## 2016-12-16 DIAGNOSIS — I10 Essential (primary) hypertension: Secondary | ICD-10-CM | POA: Diagnosis not present

## 2016-12-16 DIAGNOSIS — Z48812 Encounter for surgical aftercare following surgery on the circulatory system: Secondary | ICD-10-CM | POA: Diagnosis not present

## 2016-12-16 DIAGNOSIS — Z8673 Personal history of transient ischemic attack (TIA), and cerebral infarction without residual deficits: Secondary | ICD-10-CM | POA: Diagnosis not present

## 2016-12-16 LAB — POCT INR: INR: 2.7

## 2016-12-17 DIAGNOSIS — H6523 Chronic serous otitis media, bilateral: Secondary | ICD-10-CM | POA: Diagnosis not present

## 2016-12-18 DIAGNOSIS — I25119 Atherosclerotic heart disease of native coronary artery with unspecified angina pectoris: Secondary | ICD-10-CM | POA: Diagnosis not present

## 2016-12-18 DIAGNOSIS — Z48812 Encounter for surgical aftercare following surgery on the circulatory system: Secondary | ICD-10-CM | POA: Diagnosis not present

## 2016-12-18 DIAGNOSIS — Z8673 Personal history of transient ischemic attack (TIA), and cerebral infarction without residual deficits: Secondary | ICD-10-CM | POA: Diagnosis not present

## 2016-12-18 DIAGNOSIS — I480 Paroxysmal atrial fibrillation: Secondary | ICD-10-CM | POA: Diagnosis not present

## 2016-12-18 DIAGNOSIS — I10 Essential (primary) hypertension: Secondary | ICD-10-CM | POA: Diagnosis not present

## 2016-12-18 DIAGNOSIS — E119 Type 2 diabetes mellitus without complications: Secondary | ICD-10-CM | POA: Diagnosis not present

## 2016-12-19 DIAGNOSIS — I480 Paroxysmal atrial fibrillation: Secondary | ICD-10-CM | POA: Diagnosis not present

## 2016-12-19 DIAGNOSIS — Z48812 Encounter for surgical aftercare following surgery on the circulatory system: Secondary | ICD-10-CM | POA: Diagnosis not present

## 2016-12-19 DIAGNOSIS — I10 Essential (primary) hypertension: Secondary | ICD-10-CM | POA: Diagnosis not present

## 2016-12-19 DIAGNOSIS — I25119 Atherosclerotic heart disease of native coronary artery with unspecified angina pectoris: Secondary | ICD-10-CM | POA: Diagnosis not present

## 2016-12-19 DIAGNOSIS — Z8673 Personal history of transient ischemic attack (TIA), and cerebral infarction without residual deficits: Secondary | ICD-10-CM | POA: Diagnosis not present

## 2016-12-19 DIAGNOSIS — E119 Type 2 diabetes mellitus without complications: Secondary | ICD-10-CM | POA: Diagnosis not present

## 2016-12-19 NOTE — Progress Notes (Signed)
Cardiology Office Note    Date:  12/24/2016   ID:  Jessic, Becklund 30-Jun-1941, MRN [id number]  PCP:  Elyn Peers, MD  Cardiologist: Dr. Johnsie Cancel   Chief Complaint: Hospital follow up s/p  CABG  History of Present Illness:   Audrey Mullen is a 76 y.o. female with hx of HTN, HLD, DM, nephrolithiasis, PAF and recent CABG presents for follow up.   She was admitted at [facility] from 5/16-5/21 with sepsis secondary to acute pyelonephritis and obstructive uropathy. She was also found to be afib with RVR and elevated troponin. He had elevated lactic acid level, elevated white blood cell count on arrival. CTA scan of abdomen and pelvis revealed moderate left hydronephrosis secondary to UPJ obstruction from stone. He underwent nephrostomy tube placement on 04/02/2016. 2-D echocardiogram obtained on 04/03/2016 showed mild focal basal hypertrophy of the septum, EF 55-60%, no regional wall motion abnormalities. Due to elevated creatinine and low GFR, patient is not a candidate for NOAC. She converted to sinus rhythm since discharge.   Senn by me 10/2016 for chest pain x few months. Follow up stress test was abnormal. Cardiac cath revealed left main and 3 vessel CAD s/p CABG x 4 (LIME to LAD, SVG to 2nd dig, SVT to 1st obtuse marginal and SVG to posterior descending) and left sided MAZE procedure 12/09/16 by Dr. Roxan Hockey. Due to anemia she required post op transfusion. Hospital course complicated by stoke post CABG possible embolic pattern (? Post op afib however had MAZE vs hypotension in setting of carotid atherosclerosis).  Resulting in severe aphasia.. Coumadin is resumed when ok with neurology. Discharged stably on 12/09/16.  Here today for follow up. Ambulating without angina or dyspnea. Will start CRP II next class. Denies orthopnea, PND or syncope. Complains of swelling of L lower leg blow vein harvest site x 3 days. Tender to touch. No redness. No pain with walking. no  injury. Compliant with walking.   She was taking coreg 3.125mg  BID and Cardizem CD 240mg  prior to appointment today. BP of 156/84 with pulse of 99. Meds changes as below.   Past Medical History:  Diagnosis Date  . Anginal pain ([place])   . Arthritis   . Coronary artery disease   . Diabetes mellitus without complication ([place])   . Dysrhythmia    atrail fibrillation  . GERD (gastroesophageal reflux disease)   . Glaucoma    bilat   . Hyperlipidemia   . Hypertension   . Kidney stone   . Urinary tract infection   . Wears glasses     Past Surgical History:  Procedure Laterality Date  . CARDIAC CATHETERIZATION N/A 11/25/2016   Procedure: Left Heart Cath and Coronary Angiography;  Surgeon: Lorretta Harp, MD;  Location: [place] [facility];  Service: Cardiovascular;  Laterality: N/A;  . CORONARY ARTERY BYPASS GRAFT N/A 11/28/2016   Procedure: CORONARY ARTERY BYPASS GRAFTING (CABG) x 4;  Surgeon: Melrose Nakayama, MD;  Location: [place];  Service: Open Heart Surgery;  Laterality: N/A;  . CYSTOSCOPY WITH RETROGRADE PYELOGRAM, URETEROSCOPY AND STENT PLACEMENT Left 06/03/2016   Procedure: CYSTOSCOPY WITH LEFT RETROGRADE PYELOGRAM, URETEROSCOPY, REMOVAL OF LEFT NEPHROSTOMY TUBE, INSERTION OF DOUBLE J STENT, LEFT;  Surgeon: Carolan Clines, MD;  Location: [facility];  Service: Urology;  Laterality: Left;  . ENDOVEIN HARVEST OF GREATER SAPHENOUS VEIN Right 11/28/2016   Procedure: ENDOVEIN HARVEST OF GREATER SAPHENOUS VEIN;  Surgeon: Melrose Nakayama, MD;  Location: [place];  Service: Open  Heart Surgery;  Laterality: Right;  . EYE SURGERY     cataract removed per left eye   . HOLMIUM LASER APPLICATION Left [id number]   Procedure: HOLMIUM LASER OF STONE ;  Surgeon: Carolan Clines, MD;  Location: [facility];  Service: Urology;  Laterality: Left;  Marland Kitchen MAZE N/A 11/28/2016   Procedure: MAZE;  Surgeon: Melrose Nakayama, MD;  Location: [place];  Service: Open Heart Surgery;  Laterality: N/A;  . ROTATOR CUFF  REPAIR    . TEE WITHOUT CARDIOVERSION N/A 11/28/2016   Procedure: TRANSESOPHAGEAL ECHOCARDIOGRAM (TEE);  Surgeon: Melrose Nakayama, MD;  Location: [place];  Service: Open Heart Surgery;  Laterality: N/A;     Prior to Admission medications   Medication Sig Start Date End Date Taking? Authorizing Provider  aspirin EC 81 MG tablet Take 1 tablet (81 mg total) by mouth daily. 11/21/16  Yes Trinette Vera, PA  carvedilol (COREG) 6.25 MG tablet Take 1 tablet (6.25 mg total) by mouth 2 (two) times daily with a meal. 12/09/16  Yes Wayne E Gold, PA-C  latanoprost (XALATAN) 0.005 % ophthalmic solution Place 1 drop into both eyes at bedtime.   Yes Historical Provider, MD  Maltodextrin-Xanthan Gum ([place]) POWD To thicken fluids same as in hospital 12/09/16  Yes Wayne E Gold, PA-C  pantoprazole (PROTONIX) 40 MG tablet Take 1 tablet (40 mg total) by mouth daily. 11/07/16  Yes Sage Hammill, PA  rosuvastatin (CRESTOR) 20 MG tablet Take 20 mg by mouth daily.   Yes Historical Provider, MD  traMADol (ULTRAM) 50 MG tablet Take 1-2 tablets (50-100 mg total) by mouth every 6 (six) hours as needed for moderate pain or severe pain. 12/09/16  Yes Wayne E Gold, PA-C  warfarin (COUMADIN) 7.5 MG tablet TAKE AS DIRECTED BY COUMADIN CLINIC 12/16/16  Yes Josue Hector, MD  diltiazem (CARDIZEM CD) 240 MG 24 hr capsule Take 1 capsule (240 mg total) by mouth daily. 12/24/16 03/24/17  Leanor Kail, PA     Allergies:   Patient has no known allergies.   Social History   Social History  . Marital status: Married    Spouse name: N/A  . Number of children: N/A  . Years of education: N/A   Social History Main Topics  . Smoking status: Never Smoker  . Smokeless tobacco: Never Used  . Alcohol use No  . Drug use: No  . Sexual activity: Not Asked   Other Topics Concern  . None   Social History Narrative  . None     Family History:  The patient's family history is not on file.   ROS:     Please see the history of present illness.    ROS All other systems reviewed and are negative.   PHYSICAL EXAM:   VS:  BP (!) 156/84   Pulse 99   Ht 5\' 4"  (1.626 m)   Wt 152 lb 1.9 oz (69 kg)   SpO2 97%   BMI 26.11 kg/m    GEN: Well nourished, well developed, in no acute distress  HEENT: normal  Neck: no JVD, carotid bruits, or masses Cardiac: RRR; no murmurs, rubs, or gallops,no edema  Respiratory:  clear to auscultation bilaterally, normal work of breathing GI: soft, nontender, nondistended, + BS MS: no deformity or atrophy  Skin: warm and dry, no rash. L leg vein harvest site without erythema. She has big swelling below harvest site. TTP. Some area is hard and other rubbery.  Neuro:  Alert and  Oriented x 3, Strength and sensation are intact Psych: euthymic mood, full affect  Wt Readings from Last 3 Encounters:  12/24/16 152 lb 1.9 oz (69 kg)  12/09/16 153 lb 11.2 oz (69.7 kg)  11/21/16 159 lb 6.4 oz (72.3 kg)      Studies/Labs Reviewed:   EKG:  EKG is not ordered today.    Recent Labs: 11/07/2016: Brain Natriuretic Peptide 18.9; TSH 0.40 11/27/2016: ALT 32 11/29/2016: Magnesium 2.5 12/04/2016: BUN 15; Hemoglobin 9.5; Platelets 235; Potassium 4.8; Sodium 140 12/06/2016: Creatinine, Ser 1.21   Lipid Panel    Component Value Date/Time   CHOL 166 11/27/2016 0244   TRIG 181 (H) 11/27/2016 0244   HDL 41 11/27/2016 0244   CHOLHDL 4.0 11/27/2016 0244   VLDL 36 11/27/2016 0244   LDLCALC 89 11/27/2016 0244    Additional studies/ records that were reviewed today include:   CABG/MAZE - 11/29/16 PROCEDURE:  Median sternotomy, extracorporeal circulation, Coronary artery bypass grafting x4 (Left internal mammary artery to left anterior Descending, Saphenous vein graft to second diagonal,     Saphenous vein graft to first obtuse marginal,  Saphenous vein graft to posterior descending, Endoscopic vein harvest right leg, Left-sided maze procedure.)  SURGEON:  Remo Lipps C.  Roxan Hockey, M.D.  Cath 11/25/16   Ost LM lesion, 80 %stenosed.  Mid LAD lesion, 70 %stenosed.  Ost Cx to Prox Cx lesion, 80 %stenosed.  2nd Mrg lesion, 95 %stenosed.  Prox RCA lesion, 90 %stenosed.  Mid RCA lesion, 95 %stenosed.  Echo 04/03/16 Study Conclusions  - Left ventricle: The cavity size was normal. There was mild focal   basal hypertrophy of the septum. Systolic function was normal.   The estimated ejection fraction was in the range of 55% to 60%.   Wall motion was normal; there were no regional wall motion   abnormalities. Left ventricular diastolic function parameters   were normal. - Aortic valve: Trileaflet; moderately thickened, moderately   calcified leaflets. - Left atrium: The atrium was mildly dilated.  ASSESSMENT & PLAN:    1. CAD s/p CABG x 4 - No angina or dyspnea. Continue current meds. She will start CRP II next class.   2. Stroke post CABG - f/u with neurology. Speech has been improving.   3. PAF s/p Maze - Continue coumadin. CHA2DS2-Vasc 7. Sinus on exam.   4. HTN - Elevated. Will increase coreg to 6.25mg  BID. Continue Cardizem CD 240mg .   5. HLD - 11/27/2016: Cholesterol 166; HDL 41; LDL Cholesterol 89; Triglycerides 181; VLDL 36  - Continue statin  6. L leg hematoma vs lipoma x 3 days (worsening) - Does not look infected. Her swelling is about 2-3 cm below vein harvest site. No injury. TTP. No pain while walking. F/u with [facility] later today.   Medication Adjustments/Labs and Tests Ordered: Current medicines are reviewed at length with the patient today.  Concerns regarding medicines are outlined above.  Medication changes, Labs and Tests ordered today are listed in the Patient Instructions below. Patient Instructions  Medication Instructions:  TAKE Carvedilol (Coreg) 6.25 MG two times daily. TAKE diltiazem (Cardizem) 240 MG daily.  Labwork: NONE  Testing/Procedures: NONE  Follow-Up: Your physician recommends that you schedule a  follow-up appointment in: Dr. Johnsie Cancel in 3 months.  Follow up at Dr. Leonarda Salon office TOMORROW at 11:00 for a wound check.   Any Other Special Instructions Will Be Listed Below (If Applicable).     If you need a refill on your cardiac medications before your next  appointment, please call your pharmacy.     Jarrett Soho, [place]  12/24/2016 10:26 AM    [address], [place], [place]  [address] Phone: [phone]; Fax: [phone]

## 2016-12-20 DIAGNOSIS — I10 Essential (primary) hypertension: Secondary | ICD-10-CM | POA: Diagnosis not present

## 2016-12-20 DIAGNOSIS — Z8673 Personal history of transient ischemic attack (TIA), and cerebral infarction without residual deficits: Secondary | ICD-10-CM | POA: Diagnosis not present

## 2016-12-20 DIAGNOSIS — I480 Paroxysmal atrial fibrillation: Secondary | ICD-10-CM | POA: Diagnosis not present

## 2016-12-20 DIAGNOSIS — E119 Type 2 diabetes mellitus without complications: Secondary | ICD-10-CM | POA: Diagnosis not present

## 2016-12-20 DIAGNOSIS — I25119 Atherosclerotic heart disease of native coronary artery with unspecified angina pectoris: Secondary | ICD-10-CM | POA: Diagnosis not present

## 2016-12-20 DIAGNOSIS — Z48812 Encounter for surgical aftercare following surgery on the circulatory system: Secondary | ICD-10-CM | POA: Diagnosis not present

## 2016-12-23 ENCOUNTER — Telehealth: Payer: Self-pay | Admitting: *Deleted

## 2016-12-23 DIAGNOSIS — I10 Essential (primary) hypertension: Secondary | ICD-10-CM | POA: Diagnosis not present

## 2016-12-23 DIAGNOSIS — Z48812 Encounter for surgical aftercare following surgery on the circulatory system: Secondary | ICD-10-CM | POA: Diagnosis not present

## 2016-12-23 DIAGNOSIS — I25119 Atherosclerotic heart disease of native coronary artery with unspecified angina pectoris: Secondary | ICD-10-CM | POA: Diagnosis not present

## 2016-12-23 DIAGNOSIS — E119 Type 2 diabetes mellitus without complications: Secondary | ICD-10-CM | POA: Diagnosis not present

## 2016-12-23 DIAGNOSIS — Z8673 Personal history of transient ischemic attack (TIA), and cerebral infarction without residual deficits: Secondary | ICD-10-CM | POA: Diagnosis not present

## 2016-12-23 DIAGNOSIS — I480 Paroxysmal atrial fibrillation: Secondary | ICD-10-CM | POA: Diagnosis not present

## 2016-12-23 NOTE — Telephone Encounter (Signed)
The home health nurse for Audrey Mullen called to relate a new problem for her... hematoma on the calf of her EVH leg, s/p CABG/MAZE 11/28/16. It is not near the actual post op incisions. She does not recall hitting her leg. It is tender to touch, but is not red or warm to touch. She has an apt. with cardiology tomorrow for INR and office visit. I called her and told her to show them the hematoma. I do not think it is related to her surgery, but if they would like to her to be seen her in our office we will be glad to see her. She agreed with this plan.

## 2016-12-24 ENCOUNTER — Ambulatory Visit (INDEPENDENT_AMBULATORY_CARE_PROVIDER_SITE_OTHER): Payer: Medicare Other | Admitting: Physician Assistant

## 2016-12-24 ENCOUNTER — Encounter: Payer: Self-pay | Admitting: Physician Assistant

## 2016-12-24 ENCOUNTER — Ambulatory Visit: Payer: Medicare Other

## 2016-12-24 ENCOUNTER — Ambulatory Visit (INDEPENDENT_AMBULATORY_CARE_PROVIDER_SITE_OTHER): Payer: Medicare Other | Admitting: Pharmacist

## 2016-12-24 VITALS — BP 156/84 | HR 99 | Ht 64.0 in | Wt 152.1 lb

## 2016-12-24 DIAGNOSIS — I25708 Atherosclerosis of coronary artery bypass graft(s), unspecified, with other forms of angina pectoris: Secondary | ICD-10-CM

## 2016-12-24 DIAGNOSIS — Z951 Presence of aortocoronary bypass graft: Secondary | ICD-10-CM

## 2016-12-24 DIAGNOSIS — E785 Hyperlipidemia, unspecified: Secondary | ICD-10-CM

## 2016-12-24 DIAGNOSIS — I10 Essential (primary) hypertension: Secondary | ICD-10-CM

## 2016-12-24 DIAGNOSIS — I480 Paroxysmal atrial fibrillation: Secondary | ICD-10-CM

## 2016-12-24 DIAGNOSIS — M7989 Other specified soft tissue disorders: Secondary | ICD-10-CM

## 2016-12-24 DIAGNOSIS — I4891 Unspecified atrial fibrillation: Secondary | ICD-10-CM | POA: Diagnosis not present

## 2016-12-24 LAB — POCT INR: INR: 3.8

## 2016-12-24 MED ORDER — CARVEDILOL 6.25 MG PO TABS: 6.2500 mg | ORAL_TABLET | Freq: Two times a day (BID) | ORAL | 3 refills | Status: DC

## 2016-12-24 MED ORDER — DILTIAZEM HCL ER COATED BEADS 240 MG PO CP24: 240.0000 mg | ORAL_CAPSULE | Freq: Every day | ORAL | 3 refills | Status: DC

## 2016-12-24 NOTE — Patient Instructions (Addendum)
Medication Instructions:  TAKE Carvedilol (Coreg) 6.25 MG two times daily. TAKE diltiazem (Cardizem) 240 MG daily.  Labwork: NONE  Testing/Procedures: NONE  Follow-Up: Your physician recommends that you schedule a follow-up appointment in: Dr. Johnsie Cancel in 3 months.  Follow up at Dr. Leonarda Salon office TOMORROW at 11:00 for a wound check.   Any Other Special Instructions Will Be Listed Below (If Applicable).     If you need a refill on your cardiac medications before your next appointment, please call your pharmacy.

## 2016-12-25 ENCOUNTER — Telehealth (HOSPITAL_COMMUNITY): Payer: Self-pay | Admitting: Family Medicine

## 2016-12-25 ENCOUNTER — Telehealth (HOSPITAL_COMMUNITY): Payer: Self-pay | Admitting: *Deleted

## 2016-12-25 ENCOUNTER — Ambulatory Visit (INDEPENDENT_AMBULATORY_CARE_PROVIDER_SITE_OTHER): Payer: Self-pay | Admitting: Physician Assistant

## 2016-12-25 VITALS — BP 122/76 | HR 100 | Resp 20 | Ht 64.0 in | Wt 152.0 lb

## 2016-12-25 DIAGNOSIS — I25119 Atherosclerotic heart disease of native coronary artery with unspecified angina pectoris: Secondary | ICD-10-CM | POA: Diagnosis not present

## 2016-12-25 DIAGNOSIS — I10 Essential (primary) hypertension: Secondary | ICD-10-CM | POA: Diagnosis not present

## 2016-12-25 DIAGNOSIS — E119 Type 2 diabetes mellitus without complications: Secondary | ICD-10-CM | POA: Diagnosis not present

## 2016-12-25 DIAGNOSIS — Z48812 Encounter for surgical aftercare following surgery on the circulatory system: Secondary | ICD-10-CM | POA: Diagnosis not present

## 2016-12-25 DIAGNOSIS — Z8673 Personal history of transient ischemic attack (TIA), and cerebral infarction without residual deficits: Secondary | ICD-10-CM | POA: Diagnosis not present

## 2016-12-25 DIAGNOSIS — I480 Paroxysmal atrial fibrillation: Secondary | ICD-10-CM | POA: Diagnosis not present

## 2016-12-25 DIAGNOSIS — Z951 Presence of aortocoronary bypass graft: Secondary | ICD-10-CM

## 2016-12-25 NOTE — Patient Instructions (Signed)
Please contact office if area should increase in size or becomes red and drains purulent fluid.

## 2016-12-25 NOTE — Telephone Encounter (Signed)
Pt lft msg wants someone to rtn her call. Sent msg to [employer].... Cherylann Banas

## 2016-12-25 NOTE — Telephone Encounter (Addendum)
Returned call to pt from message left earlier.  Pt wanted more information regarding the cardiac rehab program. Advised pt that pt come here for exercise and wear the heart monitor three times a week, education classes focus on modifiable risk factors. Pt has not seen the surgeon, appt is on 2/20.  Pt is receiving home physical therapy and is unsure of how much longer this will be needed.  Advised pt to check with the therapist and let them know we were in contact and how much longer she will need therapy.  Pt asked for insurance information to be checked. Cherre Huger, BSN

## 2016-12-25 NOTE — Progress Notes (Signed)
HPI:  Patient is S/P CABG x 4 performed 11/28/2016. Her home health nurse contacted our office Monday concerned about development of a "bump" on her right lower extremity near her calf and felt she should be evaluated.   She presents today for evaluation and states the bump developed about 5 days ago.  She denies bumping her leg on anything.  She denies fevers, chills, and sweats.  She is on coumadin and her INR was checked yesterday.   Current Outpatient Prescriptions  Medication Sig Dispense Refill  . aspirin EC 81 MG tablet Take 1 tablet (81 mg total) by mouth daily. 90 tablet 3  . carvedilol (COREG) 6.25 MG tablet Take 1 tablet (6.25 mg total) by mouth 2 (two) times daily with a meal. 180 tablet 3  . diltiazem (CARDIZEM CD) 240 MG 24 hr capsule Take 1 capsule (240 mg total) by mouth daily. 90 capsule 3  . latanoprost (XALATAN) 0.005 % ophthalmic solution Place 1 drop into both eyes at bedtime.    . Maltodextrin-Xanthan Gum (RES[employer] THICKEN[employer] CLEAR) POWD To thicken fluids same as in hospital 1 Can 1  . pantoprazole (PROTONIX) 40 MG tablet Take 1 tablet (40 mg total) by mouth daily. 30 tablet 5  . predniSONE (DELTASONE) 10 MG tablet Take 10 mg by mouth daily with breakfast. Takes for Arthritis    . rosuvastatin (CRESTOR) 20 MG tablet Take 20 mg by mouth daily.    Marland Kitchen warfarin (COUMADIN) 7.5 MG tablet TAKE AS DIRECTED BY COUMADIN CLINIC 30 tablet 3  . traMADol (ULTRAM) 50 MG tablet Take 1-2 tablets (50-100 mg total) by mouth every 6 (six) hours as needed for moderate pain or severe pain. (Patient not taking: Reported on 12/25/2016) 30 tablet 0   No current facility-administered medications for this visit.     Physical Exam:  BP 122/76   Pulse 100   Resp 20   Ht 5\' 4"  (1.626 m)   Wt 152 lb (68.9 kg)   SpO2 98% Comment: RA  BMI 26.09 kg/m   Gen: no apparent distress Heart: RRR Lungs: CTA bilaterally Incisions: well healed Extremity- Right Lower extremity with 3 cm soft fluid filled  area.  No erythema present, no ecchymosis, no drainage noted  Diagnostic Tests:  N/A   A/P:  1. Right lower extremity 3 cm soft fluid filled area- this could be a seroma however, this is not a common location for a seroma.  The other differential could be a hematoma being the patient is on coumadin.   2. Dispo- being the area is not bothering the patient I recommended observation at this time.  Her INR was 3.8 yesterday which would be high to attempt drainage.  She was instructed to contact the office should this area increase or develop signs of infection 3. RTC as scheduled with Dr. Roxan Hockey on 01/07/2017   Ellwood Handler, PA-C [facility] [phone]

## 2016-12-26 DIAGNOSIS — I25119 Atherosclerotic heart disease of native coronary artery with unspecified angina pectoris: Secondary | ICD-10-CM | POA: Diagnosis not present

## 2016-12-26 DIAGNOSIS — I480 Paroxysmal atrial fibrillation: Secondary | ICD-10-CM | POA: Diagnosis not present

## 2016-12-26 DIAGNOSIS — Z48812 Encounter for surgical aftercare following surgery on the circulatory system: Secondary | ICD-10-CM | POA: Diagnosis not present

## 2016-12-26 DIAGNOSIS — I10 Essential (primary) hypertension: Secondary | ICD-10-CM | POA: Diagnosis not present

## 2016-12-26 DIAGNOSIS — Z8673 Personal history of transient ischemic attack (TIA), and cerebral infarction without residual deficits: Secondary | ICD-10-CM | POA: Diagnosis not present

## 2016-12-26 DIAGNOSIS — E119 Type 2 diabetes mellitus without complications: Secondary | ICD-10-CM | POA: Diagnosis not present

## 2016-12-30 DIAGNOSIS — I25119 Atherosclerotic heart disease of native coronary artery with unspecified angina pectoris: Secondary | ICD-10-CM | POA: Diagnosis not present

## 2016-12-30 DIAGNOSIS — Z8673 Personal history of transient ischemic attack (TIA), and cerebral infarction without residual deficits: Secondary | ICD-10-CM | POA: Diagnosis not present

## 2016-12-30 DIAGNOSIS — I10 Essential (primary) hypertension: Secondary | ICD-10-CM | POA: Diagnosis not present

## 2016-12-30 DIAGNOSIS — Z48812 Encounter for surgical aftercare following surgery on the circulatory system: Secondary | ICD-10-CM | POA: Diagnosis not present

## 2016-12-30 DIAGNOSIS — E119 Type 2 diabetes mellitus without complications: Secondary | ICD-10-CM | POA: Diagnosis not present

## 2016-12-30 DIAGNOSIS — I480 Paroxysmal atrial fibrillation: Secondary | ICD-10-CM | POA: Diagnosis not present

## 2016-12-31 ENCOUNTER — Telehealth (HOSPITAL_COMMUNITY): Payer: Self-pay | Admitting: Family Medicine

## 2016-12-31 DIAGNOSIS — I10 Essential (primary) hypertension: Secondary | ICD-10-CM | POA: Diagnosis not present

## 2016-12-31 DIAGNOSIS — I480 Paroxysmal atrial fibrillation: Secondary | ICD-10-CM | POA: Diagnosis not present

## 2016-12-31 DIAGNOSIS — E119 Type 2 diabetes mellitus without complications: Secondary | ICD-10-CM | POA: Diagnosis not present

## 2016-12-31 DIAGNOSIS — Z48812 Encounter for surgical aftercare following surgery on the circulatory system: Secondary | ICD-10-CM | POA: Diagnosis not present

## 2016-12-31 DIAGNOSIS — I25119 Atherosclerotic heart disease of native coronary artery with unspecified angina pectoris: Secondary | ICD-10-CM | POA: Diagnosis not present

## 2016-12-31 DIAGNOSIS — Z8673 Personal history of transient ischemic attack (TIA), and cerebral infarction without residual deficits: Secondary | ICD-10-CM | POA: Diagnosis not present

## 2016-12-31 NOTE — Telephone Encounter (Signed)
I contacted insurance [employer] to obtain insurance benefits. Pt has a $20.00 co-pay. Patient has no deductible and no co-insurance. Out of pocket max is $6700.00 and $1818.44 has been met. There is no limit on amount of visits. I spoke with Tiffany @ [facility] reference # [id number]

## 2017-01-01 DIAGNOSIS — I480 Paroxysmal atrial fibrillation: Secondary | ICD-10-CM | POA: Diagnosis not present

## 2017-01-01 DIAGNOSIS — I10 Essential (primary) hypertension: Secondary | ICD-10-CM | POA: Diagnosis not present

## 2017-01-01 DIAGNOSIS — I25119 Atherosclerotic heart disease of native coronary artery with unspecified angina pectoris: Secondary | ICD-10-CM | POA: Diagnosis not present

## 2017-01-01 DIAGNOSIS — Z48812 Encounter for surgical aftercare following surgery on the circulatory system: Secondary | ICD-10-CM | POA: Diagnosis not present

## 2017-01-01 DIAGNOSIS — E119 Type 2 diabetes mellitus without complications: Secondary | ICD-10-CM | POA: Diagnosis not present

## 2017-01-01 DIAGNOSIS — Z8673 Personal history of transient ischemic attack (TIA), and cerebral infarction without residual deficits: Secondary | ICD-10-CM | POA: Diagnosis not present

## 2017-01-03 DIAGNOSIS — E119 Type 2 diabetes mellitus without complications: Secondary | ICD-10-CM | POA: Diagnosis not present

## 2017-01-03 DIAGNOSIS — I10 Essential (primary) hypertension: Secondary | ICD-10-CM | POA: Diagnosis not present

## 2017-01-03 DIAGNOSIS — I25119 Atherosclerotic heart disease of native coronary artery with unspecified angina pectoris: Secondary | ICD-10-CM | POA: Diagnosis not present

## 2017-01-03 DIAGNOSIS — I480 Paroxysmal atrial fibrillation: Secondary | ICD-10-CM | POA: Diagnosis not present

## 2017-01-03 DIAGNOSIS — Z8673 Personal history of transient ischemic attack (TIA), and cerebral infarction without residual deficits: Secondary | ICD-10-CM | POA: Diagnosis not present

## 2017-01-03 DIAGNOSIS — Z48812 Encounter for surgical aftercare following surgery on the circulatory system: Secondary | ICD-10-CM | POA: Diagnosis not present

## 2017-01-06 ENCOUNTER — Other Ambulatory Visit: Payer: Self-pay | Admitting: Thoracic Surgery (Cardiothoracic Vascular Surgery)

## 2017-01-06 DIAGNOSIS — Z8673 Personal history of transient ischemic attack (TIA), and cerebral infarction without residual deficits: Secondary | ICD-10-CM | POA: Diagnosis not present

## 2017-01-06 DIAGNOSIS — Z48812 Encounter for surgical aftercare following surgery on the circulatory system: Secondary | ICD-10-CM | POA: Diagnosis not present

## 2017-01-06 DIAGNOSIS — I25119 Atherosclerotic heart disease of native coronary artery with unspecified angina pectoris: Secondary | ICD-10-CM | POA: Diagnosis not present

## 2017-01-06 DIAGNOSIS — I480 Paroxysmal atrial fibrillation: Secondary | ICD-10-CM | POA: Diagnosis not present

## 2017-01-06 DIAGNOSIS — Z951 Presence of aortocoronary bypass graft: Secondary | ICD-10-CM

## 2017-01-06 DIAGNOSIS — E119 Type 2 diabetes mellitus without complications: Secondary | ICD-10-CM | POA: Diagnosis not present

## 2017-01-06 DIAGNOSIS — I10 Essential (primary) hypertension: Secondary | ICD-10-CM | POA: Diagnosis not present

## 2017-01-07 ENCOUNTER — Ambulatory Visit (INDEPENDENT_AMBULATORY_CARE_PROVIDER_SITE_OTHER): Payer: Medicare Other | Admitting: *Deleted

## 2017-01-07 ENCOUNTER — Ambulatory Visit (INDEPENDENT_AMBULATORY_CARE_PROVIDER_SITE_OTHER): Payer: Self-pay | Admitting: Thoracic Surgery (Cardiothoracic Vascular Surgery)

## 2017-01-07 ENCOUNTER — Telehealth: Payer: Self-pay | Admitting: *Deleted

## 2017-01-07 ENCOUNTER — Ambulatory Visit
Admission: RE | Admit: 2017-01-07 | Discharge: 2017-01-07 | Disposition: A | Discharge status: Home / Self Care | Payer: Medicare Other | Source: Ambulatory Visit | Attending: Thoracic Surgery (Cardiothoracic Vascular Surgery) | Admitting: Thoracic Surgery (Cardiothoracic Vascular Surgery)

## 2017-01-07 VITALS — Ht 64.0 in | Wt 152.0 lb

## 2017-01-07 DIAGNOSIS — Z951 Presence of aortocoronary bypass graft: Secondary | ICD-10-CM

## 2017-01-07 DIAGNOSIS — I4891 Unspecified atrial fibrillation: Secondary | ICD-10-CM | POA: Diagnosis not present

## 2017-01-07 DIAGNOSIS — J90 Pleural effusion, not elsewhere classified: Secondary | ICD-10-CM | POA: Diagnosis not present

## 2017-01-07 DIAGNOSIS — I97641 Postprocedural seroma of a circulatory system organ or structure following cardiac bypass: Secondary | ICD-10-CM

## 2017-01-07 LAB — POCT INR: INR: 3.3

## 2017-01-07 MED ORDER — PREDNISONE 10 MG (21) PO TBPK: ORAL_TABLET | ORAL | 0 refills | Status: DC

## 2017-01-07 NOTE — Telephone Encounter (Signed)
Pt called stating had been ordered Prednisone dose pak  Prednisone 10 mg take 6 tabs today then 5 tabs tomorrow then 4 tabs next day then 3 tabs next day then 2 tabs next day then 1 tab then stop Will start Prednisone today Feb 20th Instructed to take Prednisone and coumadin as ordered and made appt to recheck INR on Friday Feb 23rd and she states understanding

## 2017-01-07 NOTE — Progress Notes (Signed)
Audrey Mullen       Audrey Mullen [id number]             [phone]       HPI: [name] returns today for a scheduled postoperative follow-up visit.  [name] is a 76 year old woman with a past history of atrial fibrillation, hypertension, hyperlipidemia, diabetes (?), reflux, arthritis, and glaucoma. She presented with exertional chest tightness. She had a high risk nuclear study. Cardiac catheterization she had left main and three-vessel disease. She underwent coronary bypass grafting 4 and a Maze procedure on 11/28/2016. She had a sided CVA on postoperative day #1 characterized by right upper extremity weakness, a right facial droop and dense expressive aphasia. She worked with physical therapy and occupational therapy along with speech therapy well in the hospital. Her facial droop and right arm weakness improved rapidly, but her speech was slower to improve. It was improving at the time of discharge.  She says she's been doing well. She does not have any significant pain. She did notice a lump in her right leg below her saphenous vein harvest incision a couple of weeks ago. She says that doesn't really hurt but it is a little tender when she touches it. She has not noticed any increase in the size of that since it was first noted. She hasn't had any chest pain. She is walking without difficulty. Her speech is improved dramatically.  Past Medical History:  Diagnosis Date  . Anginal pain ([place])   . Arthritis   . Coronary artery disease   . Diabetes mellitus without complication ([place])   . Dysrhythmia    atrail fibrillation  . GERD (gastroesophageal reflux disease)   . Glaucoma    bilat   . Hyperlipidemia   . Hypertension   . Kidney stone   . Urinary tract infection   . Wears glasses      Current Outpatient Prescriptions  Medication Sig Dispense Refill  . aspirin EC 81 MG tablet Take 1 tablet (81 mg total) by mouth daily. 90 tablet 3  . carvedilol  (COREG) 6.25 MG tablet Take 1 tablet (6.25 mg total) by mouth 2 (two) times daily with a meal. 180 tablet 3  . diltiazem (CARDIZEM CD) 240 MG 24 hr capsule Take 1 capsule (240 mg total) by mouth daily. 90 capsule 3  . latanoprost (XALATAN) 0.005 % ophthalmic solution Place 1 drop into both eyes at bedtime.    . rosuvastatin (CRESTOR) 20 MG tablet Take 20 mg by mouth daily.    Marland Kitchen warfarin (COUMADIN) 7.5 MG tablet TAKE AS DIRECTED BY COUMADIN CLINIC 30 tablet 3  . predniSONE (STERAPRED UNI-PAK 21 TAB) 10 MG (21) TBPK tablet Take 6 tabs po day 1, 5 on day 2, 4 on day 3, 3 on day 4, 2 on day 5, 1 on day 6 21 tablet 0   No current facility-administered medications for this visit.     Physical Exam Ht 5\' 4"  (1.626 m)   Wt 152 lb (68.9 kg)   BMI 26.59 kg/m  76 year old woman in no acute distress Alert and oriented 3 with no motor deficits and fluent speech Lungs diminished breath sounds left base Sternal incision clean dry and intact, sternum stable Cardiac regular rate and rhythm normal S1 and S2 Leg incisions healing well 3 cm long x 2 cm wide seroma medial aspect right leg mid tibial region. No erythema or tenderness  Diagnostic Tests: CHEST  2 VIEW  COMPARISON:  12/04/2016  FINDINGS: Cardiomediastinal silhouette is stable. Status post CABG. Persistent small left pleural effusion with left basilar atelectasis. No superimposed infiltrate or pulmonary edema. Mild degenerative changes thoracic spine.  IMPRESSION: Status post CABG. Persistent small left pleural effusion with left basilar atelectasis. No superimposed infiltrate or pulmonary edema.   Electronically Signed   By: Lahoma Crocker M.D.   On: 01/07/2017 09:11 I personally reviewed chest x-ray and concur with the findings noted above.  Impression: 76 year old woman who had coronary bypass grafting and a Maze procedure a little over a month ago. Perioperative course was complicated by a left-sided stroke on postoperative  day #1. She had rapid return of right arm strength but relatively slow progress with an expressive aphasia. That has continued to improve and now is not noticeable at all.  We will cardiac standpoint she's doing well. She is ambulating without any anginal pain. She does have a regular heart rhythm today. She is still on Coumadin and her INR is therapeutic at 3.3.  Right leg seroma/hematoma- this is stable in size over the past couple of weeks. Is not causing her any pain and is not interfering with her ability to ambulate. There is no sign of infection. I would favor just watching this for now and likely will resolve on its own. If she notices any redness or increasing pain or tenderness in that area she will let us know right away and we can drain it.  Left pleural effusion- she has a small left pleural effusion on chest x-ray. I don't feel it is large enough to warrant thoracentesis. I'm going to give her a prednisone taper and will repeat her chest x-ray in about 3 weeks.  Overall she has had a tremendous recovery from her CVA.  Plan: Prednisone taper  Return in 3 weeks with PA and lateral chest x-ray to follow-up left pleural effusion.  Melrose Nakayama, MD [facility] [phone]

## 2017-01-09 DIAGNOSIS — I6340 Cerebral infarction due to embolism of unspecified cerebral artery: Secondary | ICD-10-CM | POA: Diagnosis not present

## 2017-01-09 DIAGNOSIS — I10 Essential (primary) hypertension: Secondary | ICD-10-CM | POA: Diagnosis not present

## 2017-01-09 DIAGNOSIS — E119 Type 2 diabetes mellitus without complications: Secondary | ICD-10-CM | POA: Diagnosis not present

## 2017-01-09 DIAGNOSIS — I4891 Unspecified atrial fibrillation: Secondary | ICD-10-CM | POA: Diagnosis not present

## 2017-01-10 ENCOUNTER — Ambulatory Visit (INDEPENDENT_AMBULATORY_CARE_PROVIDER_SITE_OTHER): Payer: Medicare Other | Admitting: *Deleted

## 2017-01-10 DIAGNOSIS — I4891 Unspecified atrial fibrillation: Secondary | ICD-10-CM | POA: Diagnosis not present

## 2017-01-10 LAB — POCT INR: INR: 2.9

## 2017-01-15 DIAGNOSIS — E119 Type 2 diabetes mellitus without complications: Secondary | ICD-10-CM | POA: Diagnosis not present

## 2017-01-15 DIAGNOSIS — I10 Essential (primary) hypertension: Secondary | ICD-10-CM | POA: Diagnosis not present

## 2017-01-15 DIAGNOSIS — I480 Paroxysmal atrial fibrillation: Secondary | ICD-10-CM | POA: Diagnosis not present

## 2017-01-15 DIAGNOSIS — Z8673 Personal history of transient ischemic attack (TIA), and cerebral infarction without residual deficits: Secondary | ICD-10-CM | POA: Diagnosis not present

## 2017-01-15 DIAGNOSIS — I25119 Atherosclerotic heart disease of native coronary artery with unspecified angina pectoris: Secondary | ICD-10-CM | POA: Diagnosis not present

## 2017-01-15 DIAGNOSIS — Z48812 Encounter for surgical aftercare following surgery on the circulatory system: Secondary | ICD-10-CM | POA: Diagnosis not present

## 2017-01-16 ENCOUNTER — Ambulatory Visit: Payer: Medicare Other

## 2017-01-17 ENCOUNTER — Encounter: Payer: Self-pay | Admitting: Thoracic Surgery (Cardiothoracic Vascular Surgery)

## 2017-01-17 ENCOUNTER — Ambulatory Visit (INDEPENDENT_AMBULATORY_CARE_PROVIDER_SITE_OTHER): Payer: Self-pay | Admitting: Thoracic Surgery (Cardiothoracic Vascular Surgery)

## 2017-01-17 VITALS — BP 145/85 | HR 96 | Resp 20 | Ht 64.0 in | Wt 152.0 lb

## 2017-01-17 DIAGNOSIS — I97641 Postprocedural seroma of a circulatory system organ or structure following cardiac bypass: Secondary | ICD-10-CM

## 2017-01-17 DIAGNOSIS — Z951 Presence of aortocoronary bypass graft: Secondary | ICD-10-CM

## 2017-01-17 NOTE — Progress Notes (Signed)
      [address]       [place] [address]             [phone]    HPI: Audrey Mullen returns regarding her right leg seroma  She is a 76 year old woman who had coronary bypass grafting on 11/28/2016. She had a left-sided CVA postoperatively, but has had a complete recovery. I saw her in the office on 01/07/2017. She was doing very well at that time but did have a small seroma in her right leg. I recommended observation. She says that since then it has been bothering her more and now hurts. She has not noticed any redness or drainage.  Past Medical History:  Diagnosis Date  . Anginal pain ([place])   . Arthritis   . Coronary artery disease   . Diabetes mellitus without complication ([place])   . Dysrhythmia    atrail fibrillation  . GERD (gastroesophageal reflux disease)   . Glaucoma    bilat   . Hyperlipidemia   . Hypertension   . Kidney stone   . Urinary tract infection   . Wears glasses      Current Outpatient Prescriptions  Medication Sig Dispense Refill  . aspirin EC 81 MG tablet Take 1 tablet (81 mg total) by mouth daily. 90 tablet 3  . carvedilol (COREG) 6.25 MG tablet Take 1 tablet (6.25 mg total) by mouth 2 (two) times daily with a meal. 180 tablet 3  . diltiazem (CARDIZEM CD) 240 MG 24 hr capsule Take 1 capsule (240 mg total) by mouth daily. 90 capsule 3  . latanoprost (XALATAN) 0.005 % ophthalmic solution Place 1 drop into both eyes at bedtime.    . rosuvastatin (CRESTOR) 20 MG tablet Take 20 mg by mouth daily.    Marland Kitchen warfarin (COUMADIN) 7.5 MG tablet TAKE AS DIRECTED BY COUMADIN CLINIC 30 tablet 3   No current facility-administered medications for this visit.     Physical Exam BP (!) 145/85   Pulse 96   Resp 20   Ht 5\' 4"  (1.626 m)   Wt 152 lb (68.9 kg)   SpO2 96%   BMI 26.74 kg/m  76 year old woman in no acute distress Alert 3 with no focal deficits Sternal incision well-healed Leg incision with minimal eschar 3 x 2 cm seroma medial  aspect right leg mid tibial region. Appears unchanged. No erythema or induration.  Impression: Right leg seroma. She is complaining of this although it looks exactly the same on exam. We'll go ahead and aspirate that today. She understands the risks are bleeding and infection.  Procedure: The skin over the seroma was sprayed with ethyl chloride and then prepped sterilely. 3 cc 1% lidocaine was used to achieve local anesthetic effect. An 18-gauge needle was inserted into the seroma and 9 ml of clear serous fluid was evacuated completely draining the seroma. She tolerated the procedure well.  Plan: Follow up next week as planned.  Audrey Nakayama, MD [facility] [phone]

## 2017-01-23 DIAGNOSIS — I69328 Other speech and language deficits following cerebral infarction: Secondary | ICD-10-CM | POA: Diagnosis not present

## 2017-01-23 DIAGNOSIS — I10 Essential (primary) hypertension: Secondary | ICD-10-CM | POA: Diagnosis not present

## 2017-01-23 DIAGNOSIS — N390 Urinary tract infection, site not specified: Secondary | ICD-10-CM | POA: Diagnosis not present

## 2017-01-23 DIAGNOSIS — I6930 Unspecified sequelae of cerebral infarction: Secondary | ICD-10-CM | POA: Diagnosis not present

## 2017-01-23 DIAGNOSIS — I4891 Unspecified atrial fibrillation: Secondary | ICD-10-CM | POA: Diagnosis not present

## 2017-01-24 ENCOUNTER — Telehealth: Payer: Self-pay | Admitting: Cardiovascular Disease

## 2017-01-24 NOTE — Telephone Encounter (Signed)
New message      Pt was seen by Dr Criss Rosales with blood in her urine.  They did a pt/inr.  Calling to give results and get medication instruction

## 2017-01-24 NOTE — Telephone Encounter (Signed)
I spoke with Dr. Criss Rosales regarding Ms. Longo. She reportedly has developed hematuria, for which she was evaluated at the [facility] clinic yesterday. Warfarin was held yesterday, as her INR was 2.7. Hemoglobin is 10-11, improved from the time of hospital discharge in 11/2016 (9.5 at that time). I spoke with Dr. Criss Rosales and recommend that warfarin be restarted at its previous dose if her hematuria has improved. If it is stable or increased, particularly if there is evidence of clots, warfarin should be held until gross hematuria has resolved. I would like to minimize duration the patient is not anticoagulated, given history of recent stroke in the setting of atrial fibrillation. Patient should be seen by urology as soon as possible to evaluate potential causes of gross hematuria.  Nelva Bush, MD [facility] Pager: [phone]

## 2017-01-24 NOTE — Telephone Encounter (Signed)
Returned a call to Dr. Criss Rosales office for Audrey Mullen; had to leave a msg because she is at lunch.  Left a detail msg to call [facility] back or fax results to [facility] office-both numbers left on voicemail.

## 2017-01-27 ENCOUNTER — Other Ambulatory Visit: Payer: Self-pay | Admitting: Thoracic Surgery (Cardiothoracic Vascular Surgery)

## 2017-01-27 DIAGNOSIS — Z951 Presence of aortocoronary bypass graft: Secondary | ICD-10-CM

## 2017-01-28 ENCOUNTER — Encounter: Payer: Self-pay | Admitting: Thoracic Surgery (Cardiothoracic Vascular Surgery)

## 2017-01-28 ENCOUNTER — Ambulatory Visit (HOSPITAL_COMMUNITY): Payer: Medicare Other

## 2017-01-28 ENCOUNTER — Ambulatory Visit (INDEPENDENT_AMBULATORY_CARE_PROVIDER_SITE_OTHER): Payer: Self-pay | Admitting: Thoracic Surgery (Cardiothoracic Vascular Surgery)

## 2017-01-28 ENCOUNTER — Ambulatory Visit
Admission: RE | Admit: 2017-01-28 | Discharge: 2017-01-28 | Disposition: A | Discharge status: Home / Self Care | Payer: Medicare Other | Source: Ambulatory Visit | Attending: Thoracic Surgery (Cardiothoracic Vascular Surgery) | Admitting: Thoracic Surgery (Cardiothoracic Vascular Surgery)

## 2017-01-28 VITALS — BP 150/87 | HR 92 | Resp 20 | Ht 64.0 in | Wt 151.0 lb

## 2017-01-28 DIAGNOSIS — J90 Pleural effusion, not elsewhere classified: Secondary | ICD-10-CM | POA: Diagnosis not present

## 2017-01-28 DIAGNOSIS — Z951 Presence of aortocoronary bypass graft: Secondary | ICD-10-CM

## 2017-01-28 DIAGNOSIS — I97641 Postprocedural seroma of a circulatory system organ or structure following cardiac bypass: Secondary | ICD-10-CM

## 2017-01-28 MED ORDER — FUROSEMIDE 20 MG PO TABS
20.0000 mg | ORAL_TABLET | Freq: Every day | ORAL | 2 refills | Status: DC
Start: 2017-01-28 — End: 2017-02-20

## 2017-01-28 MED ORDER — POTASSIUM CHLORIDE ER 10 MEQ PO TBCR: 10.0000 meq | EXTENDED_RELEASE_TABLET | Freq: Every day | ORAL | 2 refills | Status: DC

## 2017-01-28 NOTE — Progress Notes (Signed)
  HPI:  Patient returns for routine postoperative follow-up having undergone  The patient's early postoperative recovery while in the hospital was notable for Since hospital discharge the patient reports   Current Outpatient Prescriptions  Medication Sig Dispense Refill  . aspirin EC 81 MG tablet Take 1 tablet (81 mg total) by mouth daily. 90 tablet 3  . carvedilol (COREG) 6.25 MG tablet Take 1 tablet (6.25 mg total) by mouth 2 (two) times daily with a meal. 180 tablet 3  . diltiazem (CARDIZEM CD) 240 MG 24 hr capsule Take 1 capsule (240 mg total) by mouth daily. 90 capsule 3  . latanoprost (XALATAN) 0.005 % ophthalmic solution Place 1 drop into both eyes at bedtime.    . rosuvastatin (CRESTOR) 20 MG tablet Take 20 mg by mouth daily.    Marland Kitchen warfarin (COUMADIN) 7.5 MG tablet TAKE AS DIRECTED BY COUMADIN CLINIC 30 tablet 3  . furosemide (LASIX) 20 MG tablet Take 1 tablet (20 mg total) by mouth daily. 30 tablet 2  . potassium chloride (K-DUR) 10 MEQ tablet Take 1 tablet (10 mEq total) by mouth daily. 30 tablet 2   No current facility-administered medications for this visit.     Physical Exam  Diagnostic Tests:   Impression:  Plan:   Melrose Nakayama, MD [facility] [phone]

## 2017-01-28 NOTE — Progress Notes (Signed)
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 She had a left-sided stroke postoperatively but had a complete recovery. This on the office on 01/07/2017. She had a small seroma in her right leg. I recommended observation. She also had a left pleural effusion. I treated that with a steroid taper and she returns today with a chest x-ray to follow that up.  She was really bothered by the seroma in her leg. I saw her back 2 weeks ago and drain the seroma. It had serous fluid. She says it popped back up a couple of days after it was drained. She also complains of some swelling in her right ankle. She is not having any incisional pain. She is anxious to start cardiac rehabilitation.  Past Medical History:  Diagnosis Date  . Anginal pain ([place])   . Arthritis   . Coronary artery disease   . Diabetes mellitus without complication ([place])   . Dysrhythmia    atrail fibrillation  . GERD (gastroesophageal reflux disease)   . Glaucoma    bilat   . Hyperlipidemia   . Hypertension   . Kidney stone   . Urinary tract infection   . Wears glasses      Current Outpatient Prescriptions  Medication Sig Dispense Refill  . aspirin EC 81 MG tablet Take 1 tablet (81 mg total) by mouth daily. 90 tablet 3  . carvedilol (COREG) 6.25 MG tablet Take 1 tablet (6.25 mg total) by mouth 2 (two) times daily with a meal. 180 tablet 3  . diltiazem (CARDIZEM CD) 240 MG 24 hr capsule Take 1 capsule (240 mg total) by mouth daily. 90 capsule 3  . latanoprost (XALATAN) 0.005 % ophthalmic solution Place 1 drop into both eyes at bedtime.    . rosuvastatin (CRESTOR) 20 MG tablet Take 20 mg by mouth daily.    Marland Kitchen warfarin (COUMADIN) 7.5 MG tablet TAKE AS DIRECTED BY COUMADIN CLINIC 30 tablet 3  . furosemide (LASIX)  20 MG tablet Take 1 tablet (20 mg total) by mouth daily. 30 tablet 2  . potassium chloride (K-DUR) 10 MEQ tablet Take 1 tablet (10 mEq total) by mouth daily. 30 tablet 2   No current facility-administered medications for this visit.     Physical Exam BP (!) 150/87   Pulse 92   Resp 20   Ht 5\' 4"  (1.626 m)   Wt 151 lb (68.5 kg)   SpO2 97% Comment: RA  BMI 25.62 kg/m  76 year old woman in no acute distress Alert and oriented 3 with no focal deficits, speech fluent Cardiac regular rate and rhythm normal S1 and S2 Sternum stable, incision well-healed The incision healing well Right leg seroma, nontender with no erythema, 1+ edema at ankle  Diagnostic Tests: CHEST  2 VIEW  COMPARISON:  01/07/2017  FINDINGS: There has been almost complete resolution of the small left pleural effusion. Heart size and pulmonary vascularity are normal. No infiltrates. CABG.  IMPRESSION: Almost complete resolution of the small left pleural effusion since the prior study.  No other significant abnormality.   Electronically Signed   By: Lorriane Shire M.D.   On: 01/28/2017 10:53 I personally reviewed the chest x-ray and concur with  the findings noted above  Impression: 76 year old woman who now is about 2 months out from coronary bypass grafting and Maze procedure. This was complicated by a stroke perioperatively. Fortunately she had a complete recovery.   Atrial fibrillation- status post Maze procedure. She is anticoagulated with warfarin. His recent INR was therapeutic at 2.9.  Right leg seroma- to the saphenous vein harvest. There is no evidence of infection. This is not tender. There is no reason to intervene. She had a rapid reaccumulation after drainage and will likely do so again.  Right leg swelling- again likely attributable to a vein harvest. She has no calf tenderness or other signs of DVT. She is therapeutic on Coumadin. I gave her a prescription for low-dose Lasix 20 mg  daily and potassium 10 mEq daily. I also advised her to keep the leg elevated. She may use a support stocking if she wishes.  Left pleural effusion- resolved with prednisone. No further follow-up needed.  Plan:  Lasix 20 mg daily, 30 tablets, 2 refills KDur 10 mEq daily, 30 tablets, 2 refills She knows to call if the seroma in her right leg becomes painful or she develops redness, tenderness, or excessive swelling in that area I will be happy to see her back again at any time if I can be of any further assistance with her care  Melrose Nakayama, MD [facility] [phone]

## 2017-01-30 ENCOUNTER — Ambulatory Visit (INDEPENDENT_AMBULATORY_CARE_PROVIDER_SITE_OTHER): Payer: Medicare Other | Admitting: *Deleted

## 2017-01-30 DIAGNOSIS — I4891 Unspecified atrial fibrillation: Secondary | ICD-10-CM | POA: Diagnosis not present

## 2017-01-30 LAB — POCT INR: INR: 2.4

## 2017-01-31 DIAGNOSIS — R310 Gross hematuria: Secondary | ICD-10-CM | POA: Diagnosis not present

## 2017-02-03 ENCOUNTER — Encounter (HOSPITAL_COMMUNITY): Payer: Medicare Other

## 2017-02-07 ENCOUNTER — Encounter (HOSPITAL_COMMUNITY): Payer: Medicare Other

## 2017-02-10 ENCOUNTER — Encounter: Payer: Self-pay | Admitting: Nurse Practitioner

## 2017-02-10 ENCOUNTER — Ambulatory Visit (INDEPENDENT_AMBULATORY_CARE_PROVIDER_SITE_OTHER): Payer: Medicare Other | Admitting: Nurse Practitioner

## 2017-02-10 ENCOUNTER — Encounter (HOSPITAL_COMMUNITY): Payer: Medicare Other

## 2017-02-10 VITALS — BP 140/80 | HR 98 | Ht 64.0 in | Wt 151.8 lb

## 2017-02-10 DIAGNOSIS — E785 Hyperlipidemia, unspecified: Secondary | ICD-10-CM | POA: Diagnosis not present

## 2017-02-10 DIAGNOSIS — I63412 Cerebral infarction due to embolism of left middle cerebral artery: Secondary | ICD-10-CM

## 2017-02-10 DIAGNOSIS — I10 Essential (primary) hypertension: Secondary | ICD-10-CM | POA: Diagnosis not present

## 2017-02-10 NOTE — Patient Instructions (Addendum)
Stressed the importance of management of risk factors to prevent further stroke Continue Coumadin and aspirinfor secondary stroke prevention Maintain strict control of hypertension with blood pressure goal below 130/90, today's reading 140/80 continue antihypertensive medications Control of diabetes with hemoglobin A1c below 6.5 followed by primary care most recent hemoglobin A1c7.6 patient is currently doing Mediterranean diet Cholesterol with LDL cholesterol less than 70, followed by primary care,  most recent 89  continue Crestor Exercise by walking, slowly increase , eat healthy diet with whole grains,  fresh fruits and vegetables Discussed risk for recurrent stroke/ TIA and answered additional questions Follow up in 3 months

## 2017-02-10 NOTE — Progress Notes (Signed)
[facility]  PATIENT: Audrey Mullen DOB: 09-29-1941   REASON FOR VISIT: Hospital follow-up for stroke HISTORY FROM:patient alone at visit    HISTORY OF PRESENT ILLNESS: CM Audrey Mullen, 76 year old female returns for follow-up with a history of hospital admission on 11/28/2016 for CABG 4. The surgery was performed without complications. Then on 11/29/2016 she had right upper extremity weakness and right facial droop and dense expressive aphasia. Her hemoglobin was down to 6.6 and she was transfused patient cannot have an MRI at this time due to sternal wires. She has risk factors of hypertension hyperlipidemia atrial fibrillation diabetes and coronary artery disease. CTA head and neck no intracranial arterial occlusion or high-grade stenosis. Left ICA bifurcation percent stenosis bilateral carotid artery cavernous portion atherosclerosis. Head CT no acute intracranial hemorrhage TEE no cardiac source of emboli identified LDL 89 hemoglobin A1c 7.6 she was on Coumadin  prior to admission. On follow-up visit in the stroke clinic today she has not had further stroke or TIA symptoms. She remains on Coumadin and aspirin  for secondary stroke prevention and anticoagulation. Blood pressure in the office today 140/80. She remains on Crestor 20 mg daily for hyperlipidemia. She denies any myalgias. For her diabetes she claims Dr. Criss Rosales has her on Mediterranean diet and she is supposed to be walking for exercise. She claims she is back to most of her normal activities. She claims she occasionally has problems getting her words out usually occurs when she is fatigued. Offered some speech therapy that she would like to hold off at present. She returns for reevaluation,  she is back to driving without difficulty. She is back to most of her normal activities. She lives with her husband   REVIEW OF SYSTEMS: Full 14 system review of systems performed and notable only for those listed,  all others are neg:  Constitutional: neg  Cardiovascular: neg Ear/Nose/Throat: neg  Skin: neg Eyes: neg Respiratory: neg Gastroitestinal: neg  Hematology/Lymphatic: neg  Endocrine: neg Musculoskeletal:neg Allergy/Immunology: neg Neurological: Occasional slurred speech Psychiatric: neg Sleep : neg   ALLERGIES: No Known Allergies  HOME MEDICATIONS: Outpatient Medications Prior to Visit  Medication Sig Dispense Refill  . aspirin EC 81 MG tablet Take 1 tablet (81 mg total) by mouth daily. 90 tablet 3  . carvedilol (COREG) 6.25 MG tablet Take 1 tablet (6.25 mg total) by mouth 2 (two) times daily with a meal. 180 tablet 3  . diltiazem (CARDIZEM CD) 240 MG 24 hr capsule Take 1 capsule (240 mg total) by mouth daily. 90 capsule 3  . furosemide (LASIX) 20 MG tablet Take 1 tablet (20 mg total) by mouth daily. 30 tablet 2  . latanoprost (XALATAN) 0.005 % ophthalmic solution Place 1 drop into both eyes at bedtime.    . potassium chloride (K-DUR) 10 MEQ tablet Take 1 tablet (10 mEq total) by mouth daily. 30 tablet 2  . rosuvastatin (CRESTOR) 20 MG tablet Take 20 mg by mouth daily.    Marland Kitchen warfarin (COUMADIN) 7.5 MG tablet TAKE AS DIRECTED BY COUMADIN CLINIC 30 tablet 3   No facility-administered medications prior to visit.     PAST MEDICAL HISTORY: Past Medical History:  Diagnosis Date  . Anginal pain ([place])   . Arthritis   . Coronary artery disease   . Diabetes mellitus without complication ([place])   . Dysrhythmia    atrail fibrillation  . GERD (gastroesophageal reflux disease)   . Glaucoma    bilat   . Hyperlipidemia   .  Hypertension   . Kidney stone   . Urinary tract infection   . Wears glasses     PAST SURGICAL HISTORY: Past Surgical History:  Procedure Laterality Date  . CARDIAC CATHETERIZATION N/A 11/25/2016   Procedure: Left Heart Cath and Coronary Angiography;  Surgeon: Lorretta Harp, MD;  Location: [facility];  Service: Cardiovascular;  Laterality: N/A;  .  CORONARY ARTERY BYPASS GRAFT N/A 11/28/2016   Procedure: CORONARY ARTERY BYPASS GRAFTING (CABG) x 4;  Surgeon: Melrose Nakayama, MD;  Location: [facility];  Service: Open Heart Surgery;  Laterality: N/A;  . CYSTOSCOPY WITH RETROGRADE PYELOGRAM, URETEROSCOPY AND STENT PLACEMENT Left 06/03/2016   Procedure: CYSTOSCOPY WITH LEFT RETROGRADE PYELOGRAM, URETEROSCOPY, REMOVAL OF LEFT NEPHROSTOMY TUBE, INSERTION OF DOUBLE J STENT, LEFT;  Surgeon: Carolan Clines, MD;  Location: [facility];  Service: Urology;  Laterality: Left;  . ENDOVEIN HARVEST OF GREATER SAPHENOUS VEIN Right 11/28/2016   Procedure: ENDOVEIN HARVEST OF GREATER SAPHENOUS VEIN;  Surgeon: Melrose Nakayama, MD;  Location: [facility];  Service: Open Heart Surgery;  Laterality: Right;  . EYE SURGERY     cataract removed per left eye   . HOLMIUM LASER APPLICATION Left 1/47/8295   Procedure: HOLMIUM LASER OF STONE ;  Surgeon: Carolan Clines, MD;  Location: [facility];  Service: Urology;  Laterality: Left;  Marland Kitchen MAZE N/A 11/28/2016   Procedure: MAZE;  Surgeon: Melrose Nakayama, MD;  Location: [facility];  Service: Open Heart Surgery;  Laterality: N/A;  . ROTATOR CUFF REPAIR    . TEE WITHOUT CARDIOVERSION N/A 11/28/2016   Procedure: TRANSESOPHAGEAL ECHOCARDIOGRAM (TEE);  Surgeon: Melrose Nakayama, MD;  Location: [facility];  Service: Open Heart Surgery;  Laterality: N/A;    FAMILY HISTORY: Family History  Problem Relation Age of Onset  . CAD Neg Hx     SOCIAL HISTORY: Social History   Social History  . Marital status: Married    Spouse name: N/A  . Number of children: N/A  . Years of education: N/A   Occupational History  . Not on file.   Social History Main Topics  . Smoking status: Never Smoker  . Smokeless tobacco: Never Used  . Alcohol use No  . Drug use: No  . Sexual activity: Not on file   Other Topics Concern  . Not on file   Social History Narrative  . No narrative on file     PHYSICAL EXAM  Vitals:   02/10/17 0919    BP: (!) 140/80   Pulse: 98  Weight: 151 lb 12.8 oz (68.9 kg)  Height: 5\' 4"  (1.626 m)   Body mass index is 26.06 kg/m.  Generalized: Well developed, in no acute distress  Head: normocephalic and atraumatic,. Oropharynx benign  Neck: Supple, no carotid bruits  Cardiac: Regular rate rhythm, no murmur  Musculoskeletal: No deformity   Neurological examination   Mentation: Alert oriented to time, place, history taking. Attention span and concentration appropriate. Recent and remote memory intact.  Follows all commands speech and language fluent.   Cranial nerve II-XII: Pupils were equal round reactive to light extraocular movements were full, visual field were full on confrontational test. Mild right facial droop .Facial sensation and strength were normal. hearing was intact to finger rubbing bilaterally. Uvula tongue midline. head turning and shoulder shrug were normal and symmetric.Tongue protrusion into cheek strength was normal. Motor: normal bulk and tone, full strength in the BUE, BLE, fine finger movements normal, no pronator drift. No focal weakness Sensory:  normal and symmetric to light touch, pinprick, and  Vibration, in the upper and lower extremities  Coordination: finger-nose-finger, heel-to-shin bilaterally, no dysmetria, no tremor Reflexes: 1+ upper lower and symmetric plantar responses were flexor bilaterally. Gait and Station: Rising up from seated position without assistance, normal stance,  moderate stride, good arm swing, smooth turning, able to perform tiptoe, and heel walking without difficulty. Tandem gait is mildly unsteady. No assistive device  DIAGNOSTIC DATA (LABS, IMAGING, TESTING) - I reviewed patient records, labs, notes, testing and imaging myself where available.  Lab Results  Component Value Date   WBC 9.7 12/04/2016   HGB 9.5 (L) 12/04/2016   HCT 29.7 (L) 12/04/2016   MCV 87.4 12/04/2016   PLT 235 12/04/2016      Component Value Date/Time   NA 140  12/04/2016 0316   NA 141 11/21/2016 0913   K 4.8 12/04/2016 0316   CL 109 12/04/2016 0316   CO2 25 12/04/2016 0316   GLUCOSE 135 (H) 12/04/2016 0316   BUN 15 12/04/2016 0316   BUN 25 11/21/2016 0913   CREATININE 1.21 (H) 12/06/2016 0459   CREATININE 1.30 (H) 05/03/2016 1011   CALCIUM 8.9 12/04/2016 0316   PROT 6.2 (L) 11/27/2016 0244   ALBUMIN 3.6 11/27/2016 0244   AST 24 11/27/2016 0244   ALT 32 11/27/2016 0244   ALKPHOS 46 11/27/2016 0244   BILITOT 0.6 11/27/2016 0244   GFRNONAA 42 (L) 12/06/2016 0459   GFRAA 49 (L) 12/06/2016 0459   Lab Results  Component Value Date   CHOL 166 11/27/2016   HDL 41 11/27/2016   LDLCALC 89 11/27/2016   TRIG 181 (H) 11/27/2016   CHOLHDL 4.0 11/27/2016   Lab Results  Component Value Date   HGBA1C 7.6 (H) 11/28/2016   No results found for: [id number] Lab Results  Component Value Date   TSH 0.40 11/07/2016      ASSESSMENT AND PLAN  76 y.o. year old female  here for hospital follow-up after left brain infarct post CABG procedure embolic pattern . Etiology unclear but could be related to procedure patient has history of previous atrial fibrillation and history of status post MAZE. She was also hypotensive and anemic with hemoglobin of 6.6. LDL 89 hemoglobin A1c 7.6.The patient is a current patient of Dr. Erlinda Hong  who is out of the office today . This note is sent to the work in doctor.     PLAN: Stressed the importance of management of risk factors to prevent further stroke Continue Coumadin and aspirin for secondary stroke prevention Maintain strict control of hypertension with blood pressure goal below 130/90, today's reading 140/80 continue antihypertensive medications Control of diabetes with hemoglobin A1c below 6.5 followed by primary care most recent hemoglobin A1c7.6, she claims she is on Mediterranean by diet primary care, she is supposed to walk for exercise Cholesterol with LDL cholesterol less than 70, followed by primary care,  most  recent 89  continue Crestor Exercise by walking, slowly increase , eat healthy diet with whole grains,  fresh fruits and vegetables Discussed risk for recurrent stroke/ TIA and answered additional questions Follow up in 3 months This was a  visit requiring 25 minutes and medical decision making of high complexity with extensive review of history, hospital chart, counseling and answering questions. Patient to call back if she wants speech therapy initiated Dennie Bible, [facility], [facility], APRN  [facility] [address] [place], [place] [address] [phone]

## 2017-02-10 NOTE — Progress Notes (Signed)
I have read the note, and I agree with the clinical assessment and plan.  Liborio Saccente A. Nancyjo Givhan, MD, PhD Certified in Neurology, Clinical Neurophysiology, Sleep Medicine, Pain Medicine and Neuroimaging  [facility] [address], [address] [place], [place] [address] [phone]  

## 2017-02-11 DIAGNOSIS — N3000 Acute cystitis without hematuria: Secondary | ICD-10-CM | POA: Diagnosis not present

## 2017-02-11 DIAGNOSIS — R310 Gross hematuria: Secondary | ICD-10-CM | POA: Diagnosis not present

## 2017-02-11 DIAGNOSIS — N200 Calculus of kidney: Secondary | ICD-10-CM | POA: Diagnosis not present

## 2017-02-14 ENCOUNTER — Encounter (HOSPITAL_COMMUNITY): Payer: Medicare Other

## 2017-02-17 ENCOUNTER — Encounter (HOSPITAL_COMMUNITY): Payer: Medicare Other

## 2017-02-20 ENCOUNTER — Encounter (HOSPITAL_COMMUNITY)
Admission: RE | Admit: 2017-02-20 | Discharge: 2017-02-20 | Disposition: A | Discharge status: Home / Self Care | Payer: Medicare Other | Source: Ambulatory Visit | Attending: Cardiovascular Disease | Admitting: Cardiovascular Disease

## 2017-02-20 ENCOUNTER — Telehealth (HOSPITAL_COMMUNITY): Payer: Self-pay | Admitting: *Deleted

## 2017-02-20 ENCOUNTER — Encounter (HOSPITAL_COMMUNITY): Payer: Self-pay

## 2017-02-20 VITALS — BP 148/82 | HR 89 | Ht 60.25 in | Wt 153.4 lb

## 2017-02-20 DIAGNOSIS — I2510 Atherosclerotic heart disease of native coronary artery without angina pectoris: Secondary | ICD-10-CM | POA: Diagnosis not present

## 2017-02-20 DIAGNOSIS — Z87442 Personal history of urinary calculi: Secondary | ICD-10-CM | POA: Diagnosis not present

## 2017-02-20 DIAGNOSIS — Z79899 Other long term (current) drug therapy: Secondary | ICD-10-CM | POA: Insufficient documentation

## 2017-02-20 DIAGNOSIS — Z7901 Long term (current) use of anticoagulants: Secondary | ICD-10-CM | POA: Insufficient documentation

## 2017-02-20 DIAGNOSIS — I4891 Unspecified atrial fibrillation: Secondary | ICD-10-CM | POA: Diagnosis not present

## 2017-02-20 DIAGNOSIS — I10 Essential (primary) hypertension: Secondary | ICD-10-CM | POA: Insufficient documentation

## 2017-02-20 DIAGNOSIS — H409 Unspecified glaucoma: Secondary | ICD-10-CM | POA: Diagnosis not present

## 2017-02-20 DIAGNOSIS — Z7982 Long term (current) use of aspirin: Secondary | ICD-10-CM | POA: Insufficient documentation

## 2017-02-20 DIAGNOSIS — Z951 Presence of aortocoronary bypass graft: Secondary | ICD-10-CM

## 2017-02-20 DIAGNOSIS — K219 Gastro-esophageal reflux disease without esophagitis: Secondary | ICD-10-CM | POA: Diagnosis not present

## 2017-02-20 DIAGNOSIS — E785 Hyperlipidemia, unspecified: Secondary | ICD-10-CM | POA: Diagnosis not present

## 2017-02-20 DIAGNOSIS — E119 Type 2 diabetes mellitus without complications: Secondary | ICD-10-CM | POA: Diagnosis not present

## 2017-02-20 NOTE — Progress Notes (Signed)
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  aspirin EC 81 MG tablet, Take 1 tablet (81 mg total) by mouth daily., Disp: 90 tablet, Rfl: 3 .  carvedilol (COREG) 6.25 MG tablet, Take 6.25 mg by mouth 2 (two) times daily with a meal., Disp: , Rfl:  .  diltiazem (CARDIZEM CD) 240 MG 24 hr capsule, Take 1 capsule (240 mg total) by mouth daily., Disp: 90 capsule, Rfl: 3 .  furosemide (LASIX) 20 MG tablet, Take 20 mg by mouth., Disp: , Rfl:  .  latanoprost (XALATAN) 0.005 % ophthalmic solution, Place 1 drop into both eyes at bedtime., Disp: , Rfl:  .  pantoprazole (PROTONIX) 40 MG tablet, Take 40 mg by mouth daily., Disp: , Rfl:  .  potassium chloride (K-DUR) 10 MEQ tablet, Take 10 mEq by mouth daily., Disp: , Rfl:  .  rosuvastatin (CRESTOR) 20 MG tablet, Take 20 mg by mouth daily., Disp: , Rfl:  .  warfarin (COUMADIN) 7.5 MG tablet, TAKE AS DIRECTED BY COUMADIN CLINIC (Patient taking differently: Take 1 tablet (7.5mg ) by mouth once daily.), Disp: 30 tablet, Rfl: 3  Past Medical History: Past Medical History:  Diagnosis Date  . Anginal pain ([place])   . Arthritis   . Coronary artery disease   . Diabetes mellitus without complication ([place])   . Dysrhythmia    atrail fibrillation  . GERD (gastroesophageal reflux disease)   . Glaucoma    bilat   . Hyperlipidemia   . Hypertension   . Kidney stone   . Urinary tract infection   . Wears glasses      Tobacco Use: History  Smoking Status  . Never Smoker  Smokeless Tobacco  . Never Used    Labs: Recent Review Flowsheet Data    Labs for ITP Cardiac and Pulmonary Rehab Latest Ref Rng & Units 11/28/2016 11/28/2016 11/29/2016 11/29/2016 11/29/2016   Cholestrol 0 - 200 mg/dL - - - - -   LDLCALC 0 - 99 mg/dL - - - - -   HDL >40 mg/dL - - - - -   Trlycerides <150 mg/dL - - - - -   Hemoglobin A1c 4.8 - 5.6 % - - - - -   PHART 7.350 - 7.450 7.480(H) - 7.356 7.378 -   PCO2ART 32.0 - 48.0 mmHg 29.7(L) - 41.8 37.9 -   HCO3 20.0 - 28.0 mmol/L 22.4 - 23.3 22.2 -   TCO2 0 - 100 mmol/L 23 23 24 23 24    ACIDBASEDEF 0.0 - 2.0 mmol/L 1.0 - 2.0 3.0(H) -   O2SAT % 100.0 - 99.0 98.0 -      Capillary Blood Glucose: Lab Results  Component Value Date   GLUCAP 262 (H) 12/09/2016   GLUCAP 177 (H) 12/09/2016   GLUCAP 111 (H) 12/09/2016   GLUCAP 132 (H) 12/08/2016   GLUCAP 178 (H) 12/08/2016     Exercise Target Goals: Date: (P)  02/20/17  Exercise Program Goal: Individual exercise prescription set with THRR, safety & activity barriers. Participant demonstrates ability to understand and report RPE using BORG scale, to self-measure pulse accurately, and to acknowledge the importance of the exercise prescription.  Exercise Prescription Goal: Starting with aerobic activity 30 plus minutes a day, 3 days per week for initial exercise prescription. Provide home exercise prescription and guidelines that participant acknowledges understanding prior to discharge.  Activity Barriers & Risk Stratification:     Activity Barriers & Cardiac Risk Stratification - 02/20/17 0951      Activity Barriers & Cardiac Risk Stratification   Activity Barriers Arthritis;Deconditioning;Muscular Weakness   Cardiac Risk Stratification High      6 Minute Walk:     6 Minute Walk    Row Name 02/20/17 1144         6 Minute Walk   Phase Initial     Distance 1100 feet     Walk Time 6 minutes     # of Rest  Breaks 0     MPH 2.1     METS 2.6     RPE 11     VO2 Peak 9     Symptoms No     Resting HR 89 bpm     Resting BP 148/82     Max Ex. HR 117 bpm     Max Ex. BP 187/85     2 Minute Post BP 142/98        Oxygen Initial Assessment:   Oxygen Re-Evaluation:   Oxygen Discharge (Final Oxygen Re-Evaluation):   Initial Exercise Prescription:     Initial Exercise Prescription - 02/20/17 1200      Date of Initial Exercise RX and Referring Provider   Date (P)  02/20/17   Referring Provider (P)  Jenkins Rouge MD     Recumbant Bike   Level (P)  1   Minutes (P)  10      Perform Capillary Blood Glucose checks as needed.  Exercise Prescription Changes:   Exercise Comments:   Exercise Goals and Review:     Exercise Goals    Row Name 02/20/17 [phone]             Exercise Goals   Increase Physical Activity Yes       Intervention Provide advice, education, support and counseling about physical activity/exercise needs.;Develop an individualized exercise prescription for aerobic and resistive training based on initial evaluation findings, risk stratification, comorbidities and participant's personal goals.       Expected Outcomes Achievement of increased cardiorespiratory fitness and enhanced flexibility, muscular endurance and strength shown through measurements of functional capacity and personal statement of participant.       Increase Strength and Stamina Yes       Intervention Provide advice, education, support and counseling about physical activity/exercise needs.;Develop an individualized exercise prescription for aerobic and resistive training based on initial evaluation findings, risk stratification, comorbidities and participant's personal goals.       Expected Outcomes Achievement of increased cardiorespiratory fitness and enhanced flexibility, muscular endurance and strength shown through measurements of functional capacity and personal statement of participant.           Exercise Goals Re-Evaluation :    Discharge Exercise Prescription (Final Exercise Prescription Changes):   Nutrition:  Target Goals: Understanding of nutrition guidelines, daily intake of sodium 1500mg , cholesterol 200mg , calories 30% from fat and 7% or less from saturated fats, daily to have 5 or more servings of fruits  and vegetables.  Biometrics:     Pre Biometrics - 02/20/17 1146      Pre Biometrics   Height 5' 0.25" (1.53 m)   Weight 153 lb 7 oz (69.6 kg)   Waist Circumference 34.5 inches   Hip Circumference 40.5 inches   Waist to Hip Ratio 0.85 %   BMI (Calculated) 29.8   Triceps Skinfold 29 mm   % Body Fat 41.4 %   Grip Strength 32 kg   Flexibility 11 in   Single Leg Stand 0.41 seconds       Nutrition Therapy Plan and Nutrition Goals:   Nutrition Discharge: Nutrition Scores:   Nutrition Goals Re-Evaluation:   Nutrition Goals Re-Evaluation:   Nutrition Goals Discharge (Final Nutrition Goals Re-Evaluation):   Psychosocial: Target Goals: Acknowledge presence or absence of significant depression and/or stress, maximize coping skills, provide positive support system. Participant is able to verbalize types and ability to use techniques and skills needed for reducing stress and depression.  Initial Review & Psychosocial Screening:     Initial Psych Review & Screening - 02/20/17 1803      Initial Review   Current issues with None Identified     Family Dynamics   Good Support System? Yes     Barriers   Psychosocial barriers to participate in program There are no identifiable barriers or psychosocial needs.      Quality of Life Scores:     Quality of Life - 02/20/17 1151      Quality of Life Scores   Health/Function Pre 26.7 %   Socioeconomic Pre 27.3 %   Psych/Spiritual Pre 28.29 %   Family Pre 27.6 %   GLOBAL Pre 27.28 %      PHQ-9: Recent Review Flowsheet Data    There is no flowsheet data to display.     Interpretation of  Total Score  Total Score Depression Severity:  1-4 = Minimal depression, 5-9 = Mild depression, 10-14 = Moderate depression, 15-19 = Moderately severe depression, 20-27 = Severe depression   Psychosocial Evaluation and Intervention:   Psychosocial Re-Evaluation:   Psychosocial Discharge (Final Psychosocial Re-Evaluation):   Vocational Rehabilitation: Provide vocational rehab assistance to qualifying candidates.   Vocational Rehab Evaluation & Intervention:     Vocational Rehab - 02/20/17 1804      Initial Vocational Rehab Evaluation & Intervention   Assessment shows need for Vocational Rehabilitation No  Pt is retired       Education: Education Goals: Education classes will be provided on a weekly basis, covering required topics. Participant will state understanding/return demonstration of topics presented.  Learning Barriers/Preferences:     Learning Barriers/Preferences - 02/20/17 0951      Learning Barriers/Preferences   Learning Preferences Video;Pictoral;Written Material      Education Topics: Count Your Pulse:  -Group instruction provided by verbal instruction, demonstration, patient participation and written materials to support subject.  Instructors address importance of being able to find your pulse and how to count your pulse when at home without a heart monitor.  Patients get hands on experience counting their pulse with staff help and individually.   Heart Attack, Angina, and Risk Factor Modification:  -Group instruction provided by verbal instruction, video, and written materials to support subject.  Instructors address signs and symptoms of angina and heart attacks.    Also discuss risk factors for heart disease and how to make changes to improve heart health risk factors.   Functional Fitness:  -Group instruction provided by verbal instruction,  demonstration, patient participation, and written materials to support subject.  Instructors address safety  measures for doing things around the house.  Discuss how to get up and down off the floor, how to pick things up properly, how to safely get out of a chair without assistance, and balance training.   Meditation and Mindfulness:  -Group instruction provided by verbal instruction, patient participation, and written materials to support subject.  Instructor addresses importance of mindfulness and meditation practice to help reduce stress and improve awareness.  Instructor also leads participants through a meditation exercise.    Stretching for Flexibility and Mobility:  -Group instruction provided by verbal instruction, patient participation, and written materials to support subject.  Instructors lead participants through series of stretches that are designed to increase flexibility thus improving mobility.  These stretches are additional exercise for major muscle groups that are typically performed during regular warm up and cool down.   Hands Only CPR Anytime:  -Group instruction provided by verbal instruction, video, patient participation and written materials to support subject.  Instructors co-teach with AHA video for hands only CPR.  Participants get hands on experience with mannequins.   Nutrition I class: Heart Healthy Eating:  -Group instruction provided by PowerPoint slides, verbal discussion, and written materials to support subject matter. The instructor gives an explanation and review of the Therapeutic Lifestyle Changes diet recommendations, which includes a discussion on lipid goals, dietary fat, sodium, fiber, plant stanol/sterol esters, sugar, and the components of a well-balanced, healthy diet.   Nutrition II class: Lifestyle Skills:  -Group instruction provided by PowerPoint slides, verbal discussion, and written materials to support subject matter. The instructor gives an explanation and review of label reading, grocery shopping for heart health, heart healthy recipe  modifications, and ways to make healthier choices when eating out.   Diabetes Question & Answer:  -Group instruction provided by PowerPoint slides, verbal discussion, and written materials to support subject matter. The instructor gives an explanation and review of diabetes co-morbidities, pre- and post-prandial blood glucose goals, pre-exercise blood glucose goals, signs, symptoms, and treatment of hypoglycemia and hyperglycemia, and foot care basics.   Diabetes Blitz:  -Group instruction provided by PowerPoint slides, verbal discussion, and written materials to support subject matter. The instructor gives an explanation and review of the physiology behind type 1 and type 2 diabetes, diabetes medications and rational behind using different medications, pre- and post-prandial blood glucose recommendations and Hemoglobin A1c goals, diabetes diet, and exercise including blood glucose guidelines for exercising safely.    Portion Distortion:  -Group instruction provided by PowerPoint slides, verbal discussion, written materials, and food models to support subject matter. The instructor gives an explanation of serving size versus portion size, changes in portions sizes over the last 20 years, and what consists of a serving from each food group.   Stress Management:  -Group instruction provided by verbal instruction, video, and written materials to support subject matter.  Instructors review role of stress in heart disease and how to cope with stress positively.     Exercising on Your Own:  -Group instruction provided by verbal instruction, power point, and written materials to support subject.  Instructors discuss benefits of exercise, components of exercise, frequency and intensity of exercise, and end points for exercise.  Also discuss use of nitroglycerin and activating EMS.  Review options of places to exercise outside of rehab.  Review guidelines for sex with heart disease.   Cardiac Drugs I:   -Group instruction provided by verbal instruction  and written materials to support subject.  Instructor reviews cardiac drug classes: antiplatelets, anticoagulants, beta blockers, and statins.  Instructor discusses reasons, side effects, and lifestyle considerations for each drug class.   Cardiac Drugs II:  -Group instruction provided by verbal instruction and written materials to support subject.  Instructor reviews cardiac drug classes: angiotensin converting enzyme inhibitors (ACE-I), angiotensin II receptor blockers (ARBs), nitrates, and calcium channel blockers.  Instructor discusses reasons, side effects, and lifestyle considerations for each drug class.   Anatomy and Physiology of the Circulatory System:  -Group instruction provided by verbal instruction, video, and written materials to support subject.  Reviews functional anatomy of heart, how it relates to various diagnoses, and what role the heart plays in the overall system.   Knowledge Questionnaire Score:     Knowledge Questionnaire Score - 02/20/17 1143      Knowledge Questionnaire Score   Pre Score 18/24      Core Components/Risk Factors/Patient Goals at Admission:     Personal Goals and Risk Factors at Admission - 02/20/17 1116      Core Components/Risk Factors/Patient Goals on Admission   Personal Goal Other Yes   Personal Goal improve nutrition and dietary habits   Intervention Provide nutrition counsling to assist with improving dietary habits and incorporating a more HH diet to lifestyle.    Expected Outcomes Pt will make necessary dietary changes to lifestyle and eat a more HH diet.       Core Components/Risk Factors/Patient Goals Review:    Core Components/Risk Factors/Patient Goals at Discharge (Final Review):    ITP Comments:     ITP Comments    Row Name 02/20/17 0949           ITP Comments Medical Director, Dr. Fransico Him          Comments:  Pt in today for phase II cardiac rehab  orientation appt. From 0800-1030.  As a part of the orientation appt., pt completed 5 minutes of warm stretches and 6 minute walk test.  Pt with slightly elevated bp prior to ambulation. Pt had not taken her medications this morning due to the early nature of her appt today. Pt bp did decrease with rest and was able to proceed with the walk test. Pt used rolater as assistive device to help with support and stability. Monitor showed SR with no noted ectopy.  BP did increase post walk test but subsequent bp check bp was lower and closely correlated to beginning bp. Brief Psychosocial Assessment reveals no barriers to participating in cardiac rehab.  Pt is accompanied by her spouse who supports her efforts in cardiac rehab.  Pt is planning to attend cardiac rehab twice a week due to the high copay and plans to attend for one month.  Cherre Huger, BSN Cardiac and Training and development officer

## 2017-02-20 NOTE — Telephone Encounter (Signed)
-----   Message from Dennie Bible, NP sent at 02/20/2017  3:23 PM EDT ----- Regarding: RE: Ok to participate in [place] rehab At my visit she was ok for rehab. I would ask her if she is taking her 3 B/P meds ----- Message ----- From: Rowe Pavy, RN Sent: 02/20/2017  11:24 AM To: Dennie Bible, NP Subject: Ok to participate in [place] rehab             Audrey Mullen,  Pt seen by you in the office for follow up on 3/26 for stroke post CABG. Pt in for orientation today for cardiac rehab. Pt with elevated bp today.  Pt bp 184/85 recheck 142/98 second recheck 150/80.  I read in your note recommend bp 130/90 goal.  Based upon your assessment may patient participate in cardiac rehab?  Any restrictions?  Cherre Huger, BSN Cardiac and Training and development officer

## 2017-02-20 NOTE — Progress Notes (Signed)
Cardiac Rehab Medication Review by a Pharmacist  Does the patient  feel that his/her medications are working for him/her?  yes  Has the patient been experiencing any side effects to the medications prescribed?  no  Does the patient measure his/her own blood pressure or blood glucose at home?  no   Does the patient have any problems obtaining medications due to transportation or finances?   no  Understanding of regimen: poor Understanding of indications: fair Potential of compliance: fair    Pharmacist comments: Pt presents for initial cardiac rehab appointment. No issues noted at this time.   Arrie Senate, PharmD PGY-1 Pharmacy Resident Pager: [phone] 02/20/2017

## 2017-02-21 ENCOUNTER — Encounter (HOSPITAL_COMMUNITY): Payer: Medicare Other

## 2017-02-24 ENCOUNTER — Encounter (HOSPITAL_COMMUNITY)
Admission: RE | Admit: 2017-02-24 | Discharge: 2017-02-24 | Disposition: A | Discharge status: Home / Self Care | Payer: Medicare Other | Source: Ambulatory Visit | Attending: Cardiovascular Disease | Admitting: Cardiovascular Disease

## 2017-02-24 ENCOUNTER — Telehealth: Payer: Self-pay | Admitting: Student

## 2017-02-24 DIAGNOSIS — Z951 Presence of aortocoronary bypass graft: Secondary | ICD-10-CM

## 2017-02-24 NOTE — Telephone Encounter (Signed)
Received a call from [facility] at [place] regarding the patient's elevated BP readings during exercise. BP well-controlled at rest but readings are elevated in the 180's/120's with exercise. Will increase Coreg from 6.25 mg BID to 9.375mg  BID. I have asked the patient to check her BP daily (she does not have a cuff but believes a family member does). She will call with readings if able to have this checked at home. If readings remain elevated, can increase Coreg to 12.5mg  BID if HR allows.   Have arranged for a BP visit with Pharmacy for next week.   Signed, Erma Heritage, PA-C 02/24/2017, 10:37 AM

## 2017-02-24 NOTE — Progress Notes (Signed)
Incomplete Session Note  Patient Details  Name: Audrey Mullen MRN: [id number] Date of Birth: [birth year] Referring Provider:     CARDIAC REHAB PHASE II ORIENTATION from 02/20/2017 in [facility]  Referring Provider  Mamie Nick) Jenkins Rouge MD      Audrey Mullen did not complete her rehab session.  Entry blood pressure 140/84 with a heart rate of 89. Audrey Mullen proceeded to exercise. Recheck blood pressure 170/118 with a heart rate of 98. Exercise stopped. Recheck blood pressure 160/80 after rest. Bernerd Pho PA called and notified about today's elevated blood pressure. Audrey Mullen increased Audrey Mullen's coreg to 9.375 mg twice a day. Audrey Mullen talked with Audrey Mullen over the phone and instructed her on the dosage change. Appointment also made by Audrey Mullen for Audrey Mullen to follow up with the pharmacist next Thursday at 11:00 am. Audrey Mullen does not have a blood pressure cuff at home but will get a cuff and check her blood pressure and call Audrey Mullen office with the readings. Audrey Mullen will hold off on exercising at cardiac rehab until  After she follows up with the pharmacist at Audrey Mullen office next week. Patient states understanding about her medication changes and her follow up appointment. Will fax exercise flow sheets to Audrey. Kyla Mullen office for review.Barnet Pall, RN,BSN 02/24/2017 12:16 PM

## 2017-02-25 DIAGNOSIS — H66003 Acute suppurative otitis media without spontaneous rupture of ear drum, bilateral: Secondary | ICD-10-CM | POA: Diagnosis not present

## 2017-02-27 ENCOUNTER — Ambulatory Visit (INDEPENDENT_AMBULATORY_CARE_PROVIDER_SITE_OTHER): Payer: Medicare Other | Admitting: Pharmacist

## 2017-02-27 DIAGNOSIS — I4891 Unspecified atrial fibrillation: Secondary | ICD-10-CM

## 2017-02-27 LAB — POCT INR: INR: 3.5

## 2017-02-28 ENCOUNTER — Encounter (HOSPITAL_COMMUNITY): Payer: Medicare Other

## 2017-03-03 ENCOUNTER — Ambulatory Visit (HOSPITAL_COMMUNITY): Payer: Medicare Other

## 2017-03-03 ENCOUNTER — Encounter (HOSPITAL_COMMUNITY): Payer: Medicare Other

## 2017-03-05 ENCOUNTER — Ambulatory Visit (HOSPITAL_COMMUNITY): Payer: Medicare Other

## 2017-03-05 DIAGNOSIS — H66003 Acute suppurative otitis media without spontaneous rupture of ear drum, bilateral: Secondary | ICD-10-CM | POA: Diagnosis not present

## 2017-03-06 ENCOUNTER — Ambulatory Visit (INDEPENDENT_AMBULATORY_CARE_PROVIDER_SITE_OTHER): Payer: Medicare Other | Admitting: Pharmacist

## 2017-03-06 VITALS — BP 144/74 | HR 90

## 2017-03-06 DIAGNOSIS — I10 Essential (primary) hypertension: Secondary | ICD-10-CM

## 2017-03-06 MED ORDER — CARVEDILOL 12.5 MG PO TABS: 12.5000 mg | ORAL_TABLET | Freq: Two times a day (BID) | ORAL | 11 refills | Status: DC

## 2017-03-06 NOTE — Progress Notes (Signed)
Patient ID: Audrey Mullen                 DOB: May 06, 1941                      MRN: [id number]     HPI: Audrey Mullen is a 76 y.o. female patient of Dr Johnsie Cancel referred by Bernerd Pho, PA to HTN clinic. PMH is significant for HTN, HLD, DM, nephrolithiasis, PAF, stroke, and CAD s/p CABG x4V in 10/2016. Pt has had well controlled BP readings at rest but readings have elevated to 180/120s with exercise at cardiac rehab. Pt was advised to increase her carvedilol to 9.375mg  BID 10 days ago and monitor her BP at home. She presents today for follow up.  Pt reports feeling well overall. She denies dizziness, blurred vision, or falls. She does not regularly check her BP at home. She is staying active with cardiac rehab and water aerobics.  Current HTN meds: carvedilol 9.325mg  BID, Cardizem 240mg  daily BP goal: <140/36mmHg  Family History: The patient's family history is not on file.   Social History: Denies tobacco, alcohol, and illicit drug use.   Diet: Does not add salt to food. Limits caffeine intake.   Exercise: Water aerobics and cardiac rehab.  Wt Readings from Last 3 Encounters:  02/20/17 153 lb 7 oz (69.6 kg)  02/10/17 151 lb 12.8 oz (68.9 kg)  01/28/17 151 lb (68.5 kg)   BP Readings from Last 3 Encounters:  02/20/17 (!) 148/82  02/10/17 140/80  01/28/17 (!) 150/87   Pulse Readings from Last 3 Encounters:  02/20/17 89  02/10/17 98  01/28/17 92    Renal function: CrCl cannot be calculated (Patient's most recent lab result is older than the maximum 21 days allowed.).  Past Medical History:  Diagnosis Date  . Anginal pain ([place])   . Arthritis   . Coronary artery disease   . Diabetes mellitus without complication ([place])   . Dysrhythmia    atrail fibrillation  . GERD (gastroesophageal reflux disease)   . Glaucoma    bilat   . Hyperlipidemia   . Hypertension   . Kidney stone   . Urinary tract infection   . Wears glasses     Current Outpatient  Prescriptions on File Prior to Visit  Medication Sig Dispense Refill  . aspirin EC 81 MG tablet Take 1 tablet (81 mg total) by mouth daily. 90 tablet 3  . carvedilol (COREG) 6.25 MG tablet Take 6.25 mg by mouth 2 (two) times daily with a meal.    . diltiazem (CARDIZEM CD) 240 MG 24 hr capsule Take 1 capsule (240 mg total) by mouth daily. 90 capsule 3  . furosemide (LASIX) 20 MG tablet Take 20 mg by mouth.    . latanoprost (XALATAN) 0.005 % ophthalmic solution Place 1 drop into both eyes at bedtime.    . pantoprazole (PROTONIX) 40 MG tablet Take 40 mg by mouth daily.    . potassium chloride (K-DUR) 10 MEQ tablet Take 10 mEq by mouth daily.    . rosuvastatin (CRESTOR) 20 MG tablet Take 20 mg by mouth daily.    Marland Kitchen warfarin (COUMADIN) 7.5 MG tablet TAKE AS DIRECTED BY COUMADIN CLINIC (Patient taking differently: Take 1 tablet (7.5mg ) by mouth once daily.) 30 tablet 3   No current facility-administered medications on file prior to visit.     No Known Allergies   Assessment/Plan:  1. Hypertension - BP slightly above goal <140/11mmHg. Will  increase carvedilol to 12.5mg  BID and continue Cardizem 240mg  daily. BP will continue to be monitored at cardiac rehab. Pt has f/u with Dr Johnsie Cancel in 1 month. F/u with HTN clinic as needed.   Shermaine Brigham E. Leialoha Hanna, PharmD, CPP, [facility] [address] [place], [place] [address] Phone: [phone]; Fax: [phone] 03/06/2017 11:36 AM

## 2017-03-06 NOTE — Patient Instructions (Addendum)
Increase carvedilol to 12.5mg  twice a day. You can take 2 of you 6.25mg  tablets twice a day until you run out. Then, pick up your new prescription for the 12.5mg  dose and take 1 tablet twice a day  Follow up with Dr Johnsie Cancel in 1 month as scheduled

## 2017-03-07 ENCOUNTER — Encounter (HOSPITAL_COMMUNITY): Payer: Medicare Other

## 2017-03-07 ENCOUNTER — Inpatient Hospital Stay (HOSPITAL_COMMUNITY): Admission: RE | Admit: 2017-03-07 | Payer: Medicare Other | Source: Ambulatory Visit

## 2017-03-07 ENCOUNTER — Telehealth (HOSPITAL_COMMUNITY): Payer: Self-pay | Admitting: *Deleted

## 2017-03-10 ENCOUNTER — Encounter (HOSPITAL_COMMUNITY): Payer: Medicare Other

## 2017-03-10 ENCOUNTER — Ambulatory Visit (HOSPITAL_COMMUNITY): Payer: Medicare Other

## 2017-03-11 ENCOUNTER — Encounter (HOSPITAL_COMMUNITY): Payer: Self-pay | Admitting: *Deleted

## 2017-03-11 ENCOUNTER — Telehealth (HOSPITAL_COMMUNITY): Payer: Self-pay | Admitting: *Deleted

## 2017-03-11 ENCOUNTER — Telehealth: Payer: Self-pay | Admitting: Cardiovascular Disease

## 2017-03-11 NOTE — Telephone Encounter (Signed)
Ok to do water aerobics instead

## 2017-03-11 NOTE — Telephone Encounter (Signed)
I spoke with patient in regards to stopping cardiac rehab.  She states she would like to stop rehab b/c it is "too much" for her. She reports having to stop exercise d/t increased BP. She reports still having trouble walking unassisted and standing alone. She asked if she could do water aerobics instead of cardiac rehab for now.  I advised her I would send message to Dr. Johnsie Cancel for recommendation. She voiced understanding and thanks for call.  Please advise.

## 2017-03-11 NOTE — Telephone Encounter (Signed)
Patient calling states that she will not be able to continue the cardiac rehab as it is too strenuous for her. Patient wanted to know if she could get clearance to start water aerobics again. Please call to discuss, thanks.

## 2017-03-11 NOTE — Progress Notes (Signed)
Mrs Bartko has decided not to return to cardiac rehab at this time. Mrs Talmadge wants to participate in water aerobics and plans to talk with Dr Johnsie Cancel at her next office visit in May.Barnet Pall, RN,BSN 03/11/2017 12:31 PM

## 2017-03-12 ENCOUNTER — Ambulatory Visit (HOSPITAL_COMMUNITY): Payer: Medicare Other

## 2017-03-12 NOTE — Telephone Encounter (Signed)
Called and let patient know that Dr. Johnsie Cancel stated it is okay to do water aerobic instead.

## 2017-03-14 ENCOUNTER — Ambulatory Visit (HOSPITAL_COMMUNITY): Payer: Medicare Other

## 2017-03-14 DIAGNOSIS — I6340 Cerebral infarction due to embolism of unspecified cerebral artery: Secondary | ICD-10-CM | POA: Diagnosis not present

## 2017-03-16 IMAGING — DX DG CHEST 2V
2 series · 2 of 2 positions shown · non-contrast
Comparison: 12/04/2016

CLINICAL DATA: Post CABG

EXAM:
CHEST  2 VIEW

[dg chest 2 view (1 of 2)]
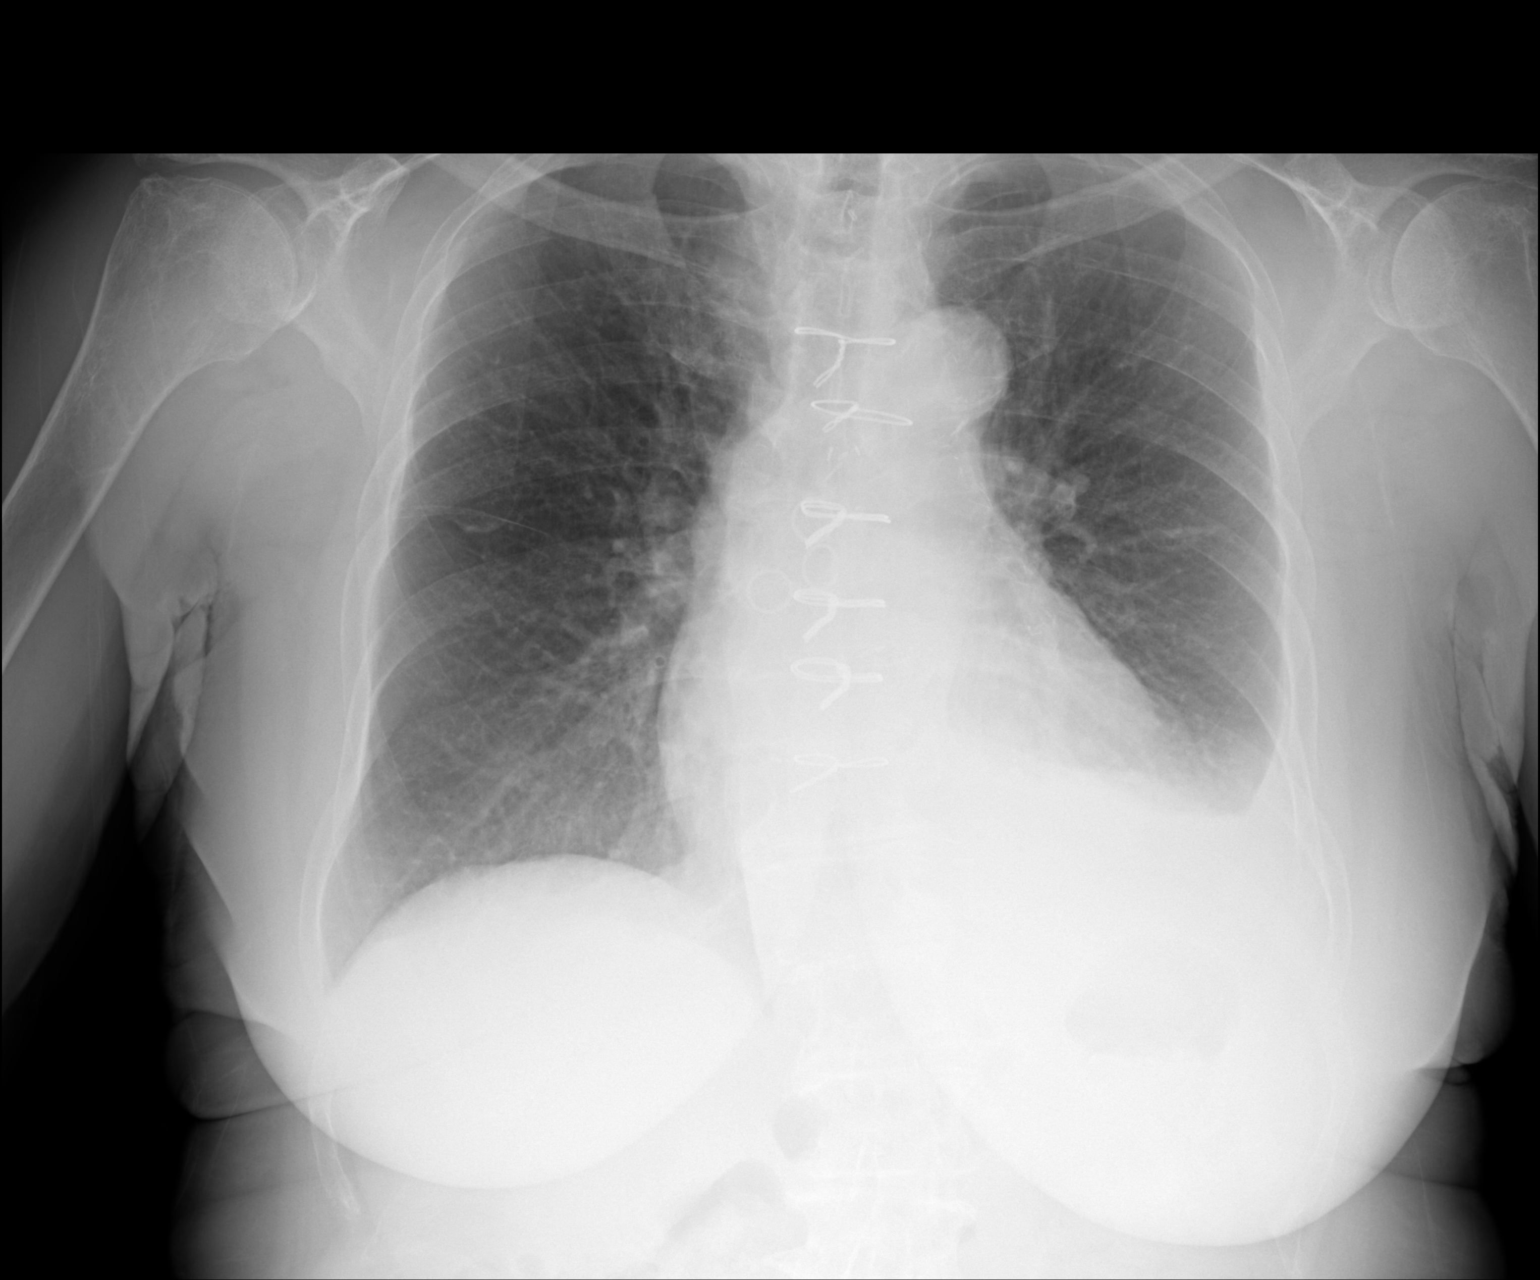

[dg chest 2 view (2 of 2)]
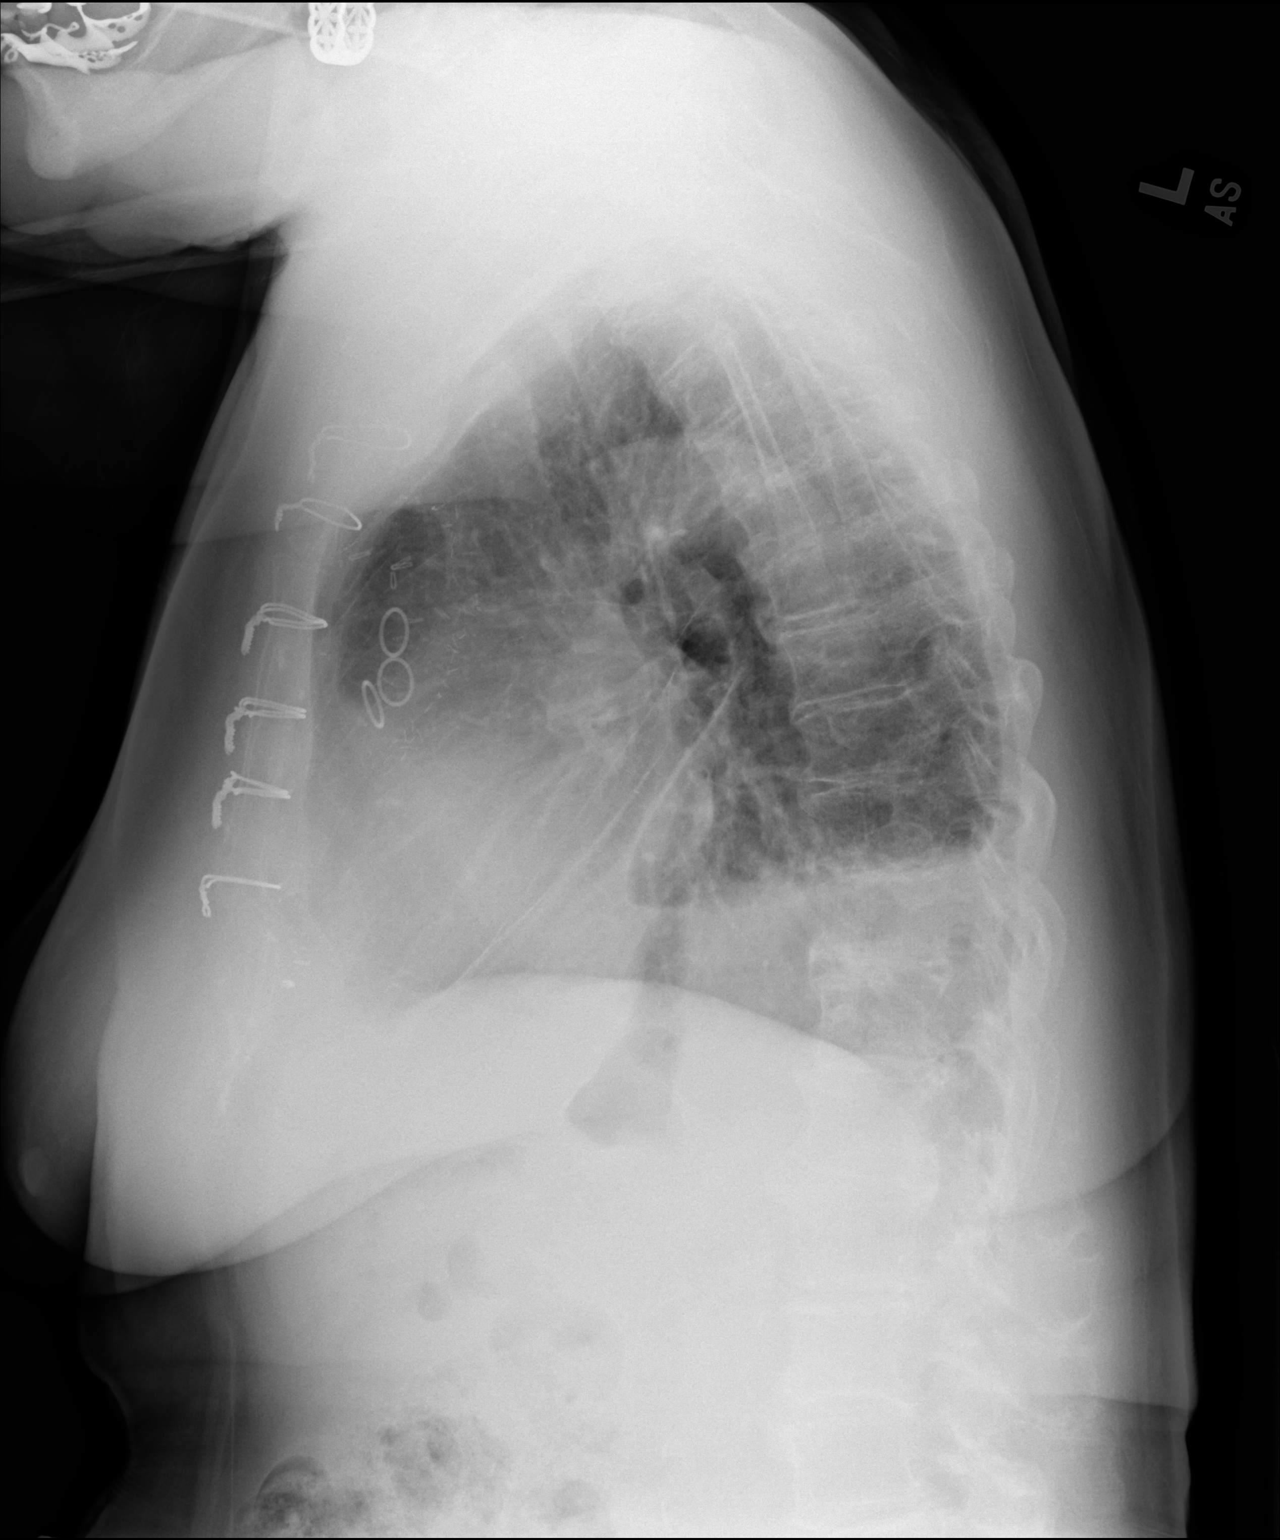

[2 of 2 positions shown; findings below may reference images not displayed]

FINDINGS: Cardiomediastinal silhouette is stable. Status post CABG. Persistent
small left pleural effusion with left basilar atelectasis. No
superimposed infiltrate or pulmonary edema. Mild degenerative
changes thoracic spine.
IMPRESSION: Status post CABG. Persistent small left pleural effusion with left
basilar atelectasis. No superimposed infiltrate or pulmonary edema.

## 2017-03-19 DIAGNOSIS — E119 Type 2 diabetes mellitus without complications: Secondary | ICD-10-CM | POA: Diagnosis not present

## 2017-03-19 DIAGNOSIS — I6340 Cerebral infarction due to embolism of unspecified cerebral artery: Secondary | ICD-10-CM | POA: Diagnosis not present

## 2017-03-19 DIAGNOSIS — I10 Essential (primary) hypertension: Secondary | ICD-10-CM | POA: Diagnosis not present

## 2017-03-19 DIAGNOSIS — I4891 Unspecified atrial fibrillation: Secondary | ICD-10-CM | POA: Diagnosis not present

## 2017-03-19 DIAGNOSIS — E782 Mixed hyperlipidemia: Secondary | ICD-10-CM | POA: Diagnosis not present

## 2017-03-20 ENCOUNTER — Ambulatory Visit (INDEPENDENT_AMBULATORY_CARE_PROVIDER_SITE_OTHER): Payer: Medicare Other | Admitting: *Deleted

## 2017-03-20 DIAGNOSIS — I4891 Unspecified atrial fibrillation: Secondary | ICD-10-CM

## 2017-03-20 LAB — POCT INR: INR: 3.8

## 2017-03-24 NOTE — Progress Notes (Signed)
Cardiology Office Note    Date:  03/31/2017   ID:  Audrey, Mullen 08/06/1941, MRN [id number]  PCP:  Audrey Lei, MD  Cardiologist: Dr. Johnsie Mullen   Chief Complaint: Hospital follow up s/p  CABG  History of Present Illness:   Audrey Mullen is a 76 y.o. female with hx of HTN, HLD, DM, nephrolithiasis, PAF and recent CABG presents for follow up.   She was admitted at [facility] from 5/16-5/21 with sepsis secondary to acute pyelonephritis and obstructive uropathy. She was also found to be afib with RVR and elevated troponin. He had elevated lactic acid level, elevated white blood cell count on arrival. CTA scan of abdomen and pelvis revealed moderate left hydronephrosis secondary to UPJ obstruction from stone. He underwent nephrostomy tube placement on 04/02/2016. 2-D echocardiogram obtained on 04/03/2016 showed mild focal basal hypertrophy of the septum, EF 55-60%, no regional wall motion abnormalities. Due to elevated creatinine and low GFR, patient is not a candidate for NOAC. She converted to sinus rhythm since discharge.   Senn by me 10/2016 for chest pain x few months. Follow up stress test was abnormal. Cardiac cath revealed left main and 3 vessel CAD s/p CABG x 4 (LIME to LAD, SVG to 2nd dig, SVT to 1st obtuse marginal and SVG to posterior descending) and left sided MAZE procedure 12/09/16 by Dr. Roxan Mullen. Due to anemia she required post op transfusion. Hospital course complicated by stoke post CABG possible embolic pattern (? Post op afib however had MAZE vs hypotension in setting of carotid atherosclerosis).  Resulting in severe aphasia..   Had some hematuria in beginning of March  BP labile and coreg increased 03/06/17  Neuropathy in hands and some hearing loss issues   Past Medical History:  Diagnosis Date  . Anginal pain ([place])   . Arthritis   . Coronary artery disease   . Diabetes mellitus without complication ([place])   . Dysrhythmia    atrail  fibrillation  . GERD (gastroesophageal reflux disease)   . Glaucoma    bilat   . Hyperlipidemia   . Hypertension   . Kidney stone   . Urinary tract infection   . Wears glasses     Past Surgical History:  Procedure Laterality Date  . CARDIAC CATHETERIZATION N/A 11/25/2016   Procedure: Left Heart Cath and Coronary Angiography;  Surgeon: Audrey Harp, MD;  Location: [place] [facility];  Service: Cardiovascular;  Laterality: N/A;  . CORONARY ARTERY BYPASS GRAFT N/A 11/28/2016   Procedure: CORONARY ARTERY BYPASS GRAFTING (CABG) x 4;  Surgeon: Audrey Nakayama, MD;  Location: [place];  Service: Open Heart Surgery;  Laterality: N/A;  . CYSTOSCOPY WITH RETROGRADE PYELOGRAM, URETEROSCOPY AND STENT PLACEMENT Left 06/03/2016   Procedure: CYSTOSCOPY WITH LEFT RETROGRADE PYELOGRAM, URETEROSCOPY, REMOVAL OF LEFT NEPHROSTOMY TUBE, INSERTION OF DOUBLE J STENT, LEFT;  Surgeon: Audrey Clines, MD;  Location[facility];  Service: Urology;  Laterality: Left;  . ENDOVEIN HARVEST OF GREATER SAPHENOUS VEIN Right 11/28/2016   Procedure: ENDOVEIN HARVEST OF GREATER SAPHENOUS VEIN;  Surgeon: Audrey Nakayama, MD;  Location: [place];  Service: Open Heart Surgery;  Laterality: Right;  . EYE SURGERY     cataract removed per left eye   . HOLMIUM LASER APPLICATION Left 5/99/3570   Procedure: HOLMIUM LASER OF STONE ;  Surgeon: Audrey Clines, MD;  [facility];  Service: Urology;  Laterality: Left;  Marland Kitchen MAZE N/A 11/28/2016   Procedure: MAZE;  Surgeon: Audrey Nakayama,  MD;  [facility];  Service: Open Heart Surgery;  Laterality: N/A;  . ROTATOR CUFF REPAIR    . TEE WITHOUT CARDIOVERSION N/A 11/28/2016   Procedure: TRANSESOPHAGEAL ECHOCARDIOGRAM (TEE);  Surgeon: Audrey Nakayama, MD;  Location: [place];  Service: Open Heart Surgery;  Laterality: N/A;     Prior to Admission medications   Medication Sig Start Date End Date Taking? Authorizing Provider  aspirin EC 81 MG tablet Take 1 tablet (81 mg  total) by mouth daily. 11/21/16  Yes Audrey Bhagat, PA  carvedilol (COREG) 6.25 MG tablet Take 1 tablet (6.25 mg total) by mouth 2 (two) times daily with a meal. 12/09/16  Yes Audrey E Gold, PA-C  latanoprost (XALATAN) 0.005 % ophthalmic solution Place 1 drop into both eyes at bedtime.   Yes Historical Provider, MD  Maltodextrin-Xanthan Gum ([place]) POWD To thicken fluids same as in hospital 12/09/16  Yes Audrey E Gold, PA-C  pantoprazole (PROTONIX) 40 MG tablet Take 1 tablet (40 mg total) by mouth daily. 11/07/16  Yes Audrey Bhagat, PA  rosuvastatin (CRESTOR) 20 MG tablet Take 20 mg by mouth daily.   Yes Historical Provider, MD  traMADol (ULTRAM) 50 MG tablet Take 1-2 tablets (50-100 mg total) by mouth every 6 (six) hours as needed for moderate pain or severe pain. 12/09/16  Yes Audrey E Gold, PA-C  warfarin (COUMADIN) 7.5 MG tablet TAKE AS DIRECTED BY COUMADIN CLINIC 12/16/16  Yes Audrey Hector, MD  diltiazem (CARDIZEM CD) 240 MG 24 hr capsule Take 1 capsule (240 mg total) by mouth daily. 12/24/16 03/24/17  Audrey Kail, PA     Allergies:   Patient has no known allergies.   Social History   Social History  . Marital status: Married    Spouse name: N/A  . Number of children: N/A  . Years of education: N/A   Social History Main Topics  . Smoking status: Never Smoker  . Smokeless tobacco: Never Used  . Alcohol use No  . Drug use: No  . Sexual activity: Not Asked   Other Topics Concern  . None   Social History Narrative  . None     Family History:  The patient's family history is not on file.   ROS:   Please see the history of present illness.    ROS All other systems reviewed and are negative.   PHYSICAL EXAM:   VS:  BP (!) 146/76   Pulse 79   Ht 5' (1.524 m)   Wt 150 lb (68 kg)   SpO2 97%   BMI 29.29 kg/m    GEN: Well nourished, well developed, in no acute distress  HEENT: normal  Neck: no JVD, carotid bruits, or masses Cardiac: RRR; no  murmurs, rubs, or gallops,no edema  Respiratory:  clear to auscultation bilaterally, normal work of breathing GI: soft, nontender, nondistended, + BS MS: no deformity or atrophy  Skin: warm and dry, no rash. L leg vein harvest site without erythema. She has big swelling below harvest site. TTP. Some area is hard and other rubbery.  Neuro:  Alert and Oriented x 3, Strength and sensation are intact Psych: euthymic mood, full affect  Wt Readings from Last 3 Encounters:  03/31/17 150 lb (68 kg)  02/20/17 153 lb 7 oz (69.6 kg)  02/10/17 151 lb 12.8 oz (68.9 kg)      Studies/Labs Reviewed:   EKG:   SR rate 88 nonspecific ST chagnes 11/29/16   Recent Labs: 11/07/2016: Brain  Natriuretic Peptide 18.9; TSH 0.40 11/27/2016: ALT 32 11/29/2016: Magnesium 2.5 12/04/2016: BUN 15; Hemoglobin 9.5; Platelets 235; Potassium 4.8; Sodium 140 12/06/2016: Creatinine, Ser 1.21   Lipid Panel    Component Value Date/Time   CHOL 166 11/27/2016 0244   TRIG 181 (H) 11/27/2016 0244   HDL 41 11/27/2016 0244   CHOLHDL 4.0 11/27/2016 0244   VLDL 36 11/27/2016 0244   LDLCALC 89 11/27/2016 0244    Additional studies/ records that were reviewed today include:   CABG/MAZE - 11/29/16 PROCEDURE:  Median sternotomy, extracorporeal circulation, Coronary artery bypass grafting x4 (Left internal mammary artery to left anterior Descending, Saphenous vein graft to second diagonal,     Saphenous vein graft to first obtuse marginal,  Saphenous vein graft to posterior descending, Endoscopic vein harvest right leg, Left-sided maze procedure.)  SURGEON:  Remo Lipps C. Audrey Mullen, M.D.  Cath 11/25/16   Ost LM lesion, 80 %stenosed.  Mid LAD lesion, 70 %stenosed.  Ost Cx to Prox Cx lesion, 80 %stenosed.  2nd Mrg lesion, 95 %stenosed.  Prox RCA lesion, 90 %stenosed.  Mid RCA lesion, 95 %stenosed.  Echo 04/03/16 Study Conclusions  - Left ventricle: The cavity size was normal. There was mild focal   basal hypertrophy  of the septum. Systolic function was normal.   The estimated ejection fraction was in the range of 55% to 60%.   Wall motion was normal; there were no regional wall motion   abnormalities. Left ventricular diastolic function parameters   were normal. - Aortic valve: Trileaflet; moderately thickened, moderately   calcified leaflets. - Left atrium: The atrium was mildly dilated.  ASSESSMENT & PLAN:    1. CAD s/p CABG x 4 - sternum healing well doing well at cardiac rehab and water aerobics   2. Stroke post CABG - f/u with neurology. Speech has been improving. Will likely need lovenox bridge if coumadin held   3. PAF s/p Maze - Continue coumadin. CHA2DS2-Vasc 7. Sinus on exam.   4. HTN -On coreg and cardizem improved continue low sodium diet   5. HLD - 11/27/2016: Cholesterol 166; HDL 41; LDL Cholesterol 89; Triglycerides 181; VLDL 36  - Continue statin  6. Neuropathy:  Hands ? From stroke f/u neurology consider gabapentin  7. Hearing Loss:  Not on any ototoxic drugs f/u [facility]   Jenkins Rouge, MD

## 2017-03-31 ENCOUNTER — Ambulatory Visit (INDEPENDENT_AMBULATORY_CARE_PROVIDER_SITE_OTHER): Payer: Medicare Other | Admitting: Cardiovascular Disease

## 2017-03-31 ENCOUNTER — Ambulatory Visit (INDEPENDENT_AMBULATORY_CARE_PROVIDER_SITE_OTHER): Payer: Medicare Other | Admitting: *Deleted

## 2017-03-31 ENCOUNTER — Encounter: Payer: Self-pay | Admitting: Cardiovascular Disease

## 2017-03-31 VITALS — BP 146/76 | HR 79 | Ht 60.0 in | Wt 150.0 lb

## 2017-03-31 DIAGNOSIS — I2510 Atherosclerotic heart disease of native coronary artery without angina pectoris: Secondary | ICD-10-CM

## 2017-03-31 DIAGNOSIS — I4891 Unspecified atrial fibrillation: Secondary | ICD-10-CM

## 2017-03-31 DIAGNOSIS — I10 Essential (primary) hypertension: Secondary | ICD-10-CM

## 2017-03-31 LAB — POCT INR: INR: 2.8

## 2017-03-31 NOTE — Patient Instructions (Signed)

## 2017-04-01 DIAGNOSIS — H47233 Glaucomatous optic atrophy, bilateral: Secondary | ICD-10-CM | POA: Diagnosis not present

## 2017-04-01 DIAGNOSIS — H25011 Cortical age-related cataract, right eye: Secondary | ICD-10-CM | POA: Diagnosis not present

## 2017-04-01 DIAGNOSIS — H401231 Low-tension glaucoma, bilateral, mild stage: Secondary | ICD-10-CM | POA: Diagnosis not present

## 2017-04-01 DIAGNOSIS — H33322 Round hole, left eye: Secondary | ICD-10-CM | POA: Diagnosis not present

## 2017-04-01 DIAGNOSIS — H6523 Chronic serous otitis media, bilateral: Secondary | ICD-10-CM | POA: Diagnosis not present

## 2017-04-01 DIAGNOSIS — H18413 Arcus senilis, bilateral: Secondary | ICD-10-CM | POA: Diagnosis not present

## 2017-04-01 DIAGNOSIS — H906 Mixed conductive and sensorineural hearing loss, bilateral: Secondary | ICD-10-CM | POA: Diagnosis not present

## 2017-04-17 ENCOUNTER — Encounter (INDEPENDENT_AMBULATORY_CARE_PROVIDER_SITE_OTHER): Payer: Medicare Other | Admitting: Ophthalmology

## 2017-04-17 DIAGNOSIS — H2511 Age-related nuclear cataract, right eye: Secondary | ICD-10-CM | POA: Diagnosis not present

## 2017-04-17 DIAGNOSIS — H35033 Hypertensive retinopathy, bilateral: Secondary | ICD-10-CM

## 2017-04-17 DIAGNOSIS — H43813 Vitreous degeneration, bilateral: Secondary | ICD-10-CM

## 2017-04-17 DIAGNOSIS — H34832 Tributary (branch) retinal vein occlusion, left eye, with macular edema: Secondary | ICD-10-CM | POA: Diagnosis not present

## 2017-04-17 DIAGNOSIS — I10 Essential (primary) hypertension: Secondary | ICD-10-CM

## 2017-04-18 ENCOUNTER — Ambulatory Visit (INDEPENDENT_AMBULATORY_CARE_PROVIDER_SITE_OTHER): Payer: Medicare Other

## 2017-04-18 DIAGNOSIS — I4891 Unspecified atrial fibrillation: Secondary | ICD-10-CM | POA: Diagnosis not present

## 2017-04-18 LAB — POCT INR: INR: 2.4

## 2017-04-30 ENCOUNTER — Other Ambulatory Visit: Payer: Self-pay | Admitting: Thoracic Surgery (Cardiothoracic Vascular Surgery)

## 2017-05-01 ENCOUNTER — Other Ambulatory Visit: Payer: Self-pay | Admitting: Physician Assistant

## 2017-05-01 DIAGNOSIS — R079 Chest pain, unspecified: Secondary | ICD-10-CM

## 2017-05-09 ENCOUNTER — Encounter (INDEPENDENT_AMBULATORY_CARE_PROVIDER_SITE_OTHER): Payer: Self-pay

## 2017-05-09 ENCOUNTER — Ambulatory Visit (INDEPENDENT_AMBULATORY_CARE_PROVIDER_SITE_OTHER): Payer: Medicare Other | Admitting: *Deleted

## 2017-05-09 DIAGNOSIS — I4891 Unspecified atrial fibrillation: Secondary | ICD-10-CM

## 2017-05-09 LAB — POCT INR: INR: 2.6

## 2017-05-12 ENCOUNTER — Encounter (INDEPENDENT_AMBULATORY_CARE_PROVIDER_SITE_OTHER): Payer: Self-pay

## 2017-05-12 ENCOUNTER — Ambulatory Visit (INDEPENDENT_AMBULATORY_CARE_PROVIDER_SITE_OTHER): Payer: Medicare Other | Admitting: Nurse Practitioner

## 2017-05-12 ENCOUNTER — Other Ambulatory Visit: Payer: Self-pay | Admitting: Cardiovascular Disease

## 2017-05-12 ENCOUNTER — Encounter: Payer: Self-pay | Admitting: Nurse Practitioner

## 2017-05-12 VITALS — BP 150/90 | HR 84 | Ht 60.0 in | Wt 149.6 lb

## 2017-05-12 DIAGNOSIS — E785 Hyperlipidemia, unspecified: Secondary | ICD-10-CM | POA: Diagnosis not present

## 2017-05-12 DIAGNOSIS — I10 Essential (primary) hypertension: Secondary | ICD-10-CM | POA: Diagnosis not present

## 2017-05-12 DIAGNOSIS — I63412 Cerebral infarction due to embolism of left middle cerebral artery: Secondary | ICD-10-CM | POA: Diagnosis not present

## 2017-05-12 MED ORDER — GABAPENTIN 100 MG PO CAPS: 100.0000 mg | ORAL_CAPSULE | Freq: Every day | ORAL | 1 refills | Status: AC

## 2017-05-12 NOTE — Patient Instructions (Addendum)
Stressed the importance of management of risk factors to prevent further stroke Continue Coumadin and aspirin for secondary stroke prevention Maintain strict control of hypertension with blood pressure goal below 130/90, today's reading 150/90 , patient has not taken meds yet,  continue antihypertensive medications Control of diabetes with hemoglobin A1c below 6.5 followed by primary care  she is on Mediterranean by diet primary care, she is supposed to walk for exercise Cholesterol with LDL cholesterol less than 70, followed by primary care,   continue Crestor Exercise by walking, water aerobics,  eat healthy diet with whole grains,  fresh fruits and vegetables Will try Gabapentin for hand numbness and pain at hs  Follow up in 6 months

## 2017-05-12 NOTE — Progress Notes (Signed)
[facility]  PATIENT: Audrey Mullen DOB: [birth year]   REASON FOR VISIT:  follow-up for stroke Jan 2018 new complaint of numbness and pain in both hands  HISTORY FROM:patient alone at visit    HISTORY OF PRESENT ILLNESS: UPDATE 06/25/2018CM Ms. Franzoni, 76 year old female returns for follow-up with history of left brain infarct post CABG  in January 2018. She is currently on Coumadin and aspirin without further stroke or TIA symptoms. She has minimal bruising and no bleeding. Blood pressure in the office today 150/94 she has not taken her blood pressure medications morning. She remains on Crestor for hyperlipidemia without complaints of myalgias. She has a new complaint of numbness in both hands comes and goes but the pain in her hands can wake her up at night. She continues to go to water aerobics and she walks for exercise. She is independent in all activities of daily living and drives without difficulty. She is continuing to follow a diabetic diet. She returns for reevaluation   02/10/17 CM Ms. Lokken, 76 year old female returns for follow-up with a history of hospital admission on 11/28/2016 for CABG 4. The surgery was performed without complications. Then on 11/29/2016 she had right upper extremity weakness and right facial droop and dense expressive aphasia. Her hemoglobin was down to 6.6 and she was transfused patient cannot have an MRI at this time due to sternal wires. She has risk factors of hypertension hyperlipidemia atrial fibrillation diabetes and coronary artery disease. CTA head and neck no intracranial arterial occlusion or high-grade stenosis. Left ICA bifurcation percent stenosis bilateral carotid artery cavernous portion atherosclerosis. Head CT no acute intracranial hemorrhage TEE no cardiac source of emboli identified LDL 89 hemoglobin A1c 7.6 she was on Coumadin  prior to admission. On follow-up visit in the stroke clinic today she has not had  further stroke or TIA symptoms. She remains on Coumadin and aspirin  for secondary stroke prevention and anticoagulation. Blood pressure in the office today 140/80. She remains on Crestor 20 mg daily for hyperlipidemia. She denies any myalgias. For her diabetes she claims Dr. Criss Rosales has her on Mediterranean diet and she is supposed to be walking for exercise. She claims she is back to most of her normal activities. She claims she occasionally has problems getting her words out usually occurs when she is fatigued. Offered some speech therapy that she would like to hold off at present. She returns for reevaluation,  she is back to driving without difficulty. She is back to most of her normal activities. She lives with her husband   REVIEW OF SYSTEMS: Full 14 system review of systems performed and notable only for those listed, all others are neg:  Constitutional: neg  Cardiovascular: Leg swelling Ear/Nose/Throat: neg  Skin: neg Eyes: neg Respiratory: neg Gastroitestinal: neg  Hematology/Lymphatic: neg  Endocrine: neg Musculoskeletal: Joint pain  Allergy/Immunology: neg Neurological: Occasional slurred speech Psychiatric: neg Sleep : neg   ALLERGIES: No Known Allergies  HOME MEDICATIONS: Outpatient Medications Prior to Visit  Medication Sig Dispense Refill  . aspirin EC 81 MG tablet Take 1 tablet (81 mg total) by mouth daily. 90 tablet 3  . carvedilol (COREG) 12.5 MG tablet Take 1 tablet (12.5 mg total) by mouth 2 (two) times daily with a meal. 60 tablet 11  . furosemide (LASIX) 20 MG tablet Take 20 mg by mouth daily.    Marland Kitchen latanoprost (XALATAN) 0.005 % ophthalmic solution Place 1 drop into both eyes at bedtime.    . pantoprazole (  PROTONIX) 40 MG tablet TAKE 1 TABLET(40 MG) BY MOUTH DAILY 30 tablet 11  . potassium chloride (K-DUR) 10 MEQ tablet Take 10 mEq by mouth daily.    . rosuvastatin (CRESTOR) 20 MG tablet Take 20 mg by mouth daily.    Marland Kitchen warfarin (COUMADIN) 7.5 MG tablet TAKE AS  DIRECTED BY COUMADIN CLINIC (Patient taking differently: Take 1 tablet (7.5mg ) by mouth once daily.) 30 tablet 3  . diltiazem (CARDIZEM CD) 240 MG 24 hr capsule Take 1 capsule (240 mg total) by mouth daily. 90 capsule 3   No facility-administered medications prior to visit.     PAST MEDICAL HISTORY: Past Medical History:  Diagnosis Date  . Anginal pain ([place])   . Arthritis   . Coronary artery disease   . Diabetes mellitus without complication ([place])   . Dysrhythmia    atrail fibrillation  . GERD (gastroesophageal reflux disease)   . Glaucoma    bilat   . Hyperlipidemia   . Hypertension   . Kidney stone   . Urinary tract infection   . Wears glasses     PAST SURGICAL HISTORY: Past Surgical History:  Procedure Laterality Date  . CARDIAC CATHETERIZATION N/A 11/25/2016   Procedure: Left Heart Cath and Coronary Angiography;  Surgeon: Lorretta Harp, MD;  Location: [place] [facility];  Service: Cardiovascular;  Laterality: N/A;  . CORONARY ARTERY BYPASS GRAFT N/A 11/28/2016   Procedure: CORONARY ARTERY BYPASS GRAFTING (CABG) x 4;  Surgeon: Melrose Nakayama, MD;  Location: [place];  Service: Open Heart Surgery;  Laterality: N/A;  . CYSTOSCOPY WITH RETROGRADE PYELOGRAM, URETEROSCOPY AND STENT PLACEMENT Left 06/03/2016   Procedure: CYSTOSCOPY WITH LEFT RETROGRADE PYELOGRAM, URETEROSCOPY, REMOVAL OF LEFT NEPHROSTOMY TUBE, INSERTION OF DOUBLE J STENT, LEFT;  Surgeon: Carolan Clines, MD;  [facility];  Service: Urology;  Laterality: Left;  . ENDOVEIN HARVEST OF GREATER SAPHENOUS VEIN Right 11/28/2016   Procedure: ENDOVEIN HARVEST OF GREATER SAPHENOUS VEIN;  Surgeon: Melrose Nakayama, MD;  Location: [place] [facility];  Service: Open Heart Surgery;  Laterality: Right;  . EYE SURGERY     cataract removed per left eye   . HOLMIUM LASER APPLICATION Left 0/16/0109   Procedure: HOLMIUM LASER OF STONE ;  Surgeon: Carolan Clines, MD;  [facility];  Service: Urology;  Laterality: Left;  Marland Kitchen  MAZE N/A 11/28/2016   Procedure: MAZE;  Surgeon: Melrose Nakayama, MD;  Location: [place];  Service: Open Heart Surgery;  Laterality: N/A;  . ROTATOR CUFF REPAIR    . TEE WITHOUT CARDIOVERSION N/A 11/28/2016   Procedure: TRANSESOPHAGEAL ECHOCARDIOGRAM (TEE);  Surgeon: Melrose Nakayama, MD;  Location: [place];  Service: Open Heart Surgery;  Laterality: N/A;    FAMILY HISTORY: Family History  Problem Relation Age of Onset  . CAD Neg Hx     SOCIAL HISTORY: Social History   Social History  . Marital status: Married    Spouse name: N/A  . Number of children: N/A  . Years of education: N/A   Occupational History  . Not on file.   Social History Main Topics  . Smoking status: Never Smoker  . Smokeless tobacco: Never Used  . Alcohol use No  . Drug use: No  . Sexual activity: Not on file   Other Topics Concern  . Not on file   Social History Narrative  . No narrative on file     PHYSICAL EXAM  Vitals:   02/10/17 0919  BP: (!) 150/90  Pulse: 98  Weight: 151 lb 12.8 oz (68.9 kg)  Height: 5\' 4"  (1.626 m)   Body mass index is 29.22 kg/m.  Generalized: Well developed, Mildly obese female in no acute distress  Head: normocephalic and atraumatic,. Oropharynx benign  Neck: Supple, no carotid bruits  Cardiac: Regular rate rhythm, no murmur  Musculoskeletal: No deformity   Neurological examination   Mentation: Alert oriented to time, place, history taking. Attention span and concentration appropriate. Recent and remote memory intact.  Follows all commands speech and language fluent.   Cranial nerve II-XII: Pupils were equal round reactive to light extraocular movements were full, visual field were full on confrontational test.  .Facial sensation and strength were normal. hearing was intact to finger rubbing bilaterally. Uvula tongue midline. head turning and shoulder shrug were normal and symmetric.Tongue protrusion into cheek strength was normal. Motor: normal bulk  and tone, full strength in the BUE, BLE, fine finger movements normal, no pronator drift. No focal weakness Sensory: normal and symmetric to light touch, pinprick, and  Vibration, in the upper and lower extremities  Coordination: finger-nose-finger, heel-to-shin bilaterally, no dysmetria, no tremor Reflexes: 1+ upper lower and symmetric plantar responses were flexor bilaterally. Gait and Station: Rising up from seated position without assistance, normal stance,  moderate stride, good arm swing, smooth turning, able to perform tiptoe, and heel walking without difficulty. Tandem gait is mildly unsteady. No assistive device  DIAGNOSTIC DATA (LABS, IMAGING, TESTING) - I reviewed patient records, labs, notes, testing and imaging myself where available.  Lab Results  Component Value Date   WBC 9.7 12/04/2016   HGB 9.5 (L) 12/04/2016   HCT 29.7 (L) 12/04/2016   MCV 87.4 12/04/2016   PLT 235 12/04/2016      Component Value Date/Time   NA 140 12/04/2016 0316   NA 141 11/21/2016 0913   K 4.8 12/04/2016 0316   CL 109 12/04/2016 0316   CO2 25 12/04/2016 0316   GLUCOSE 135 (H) 12/04/2016 0316   BUN 15 12/04/2016 0316   BUN 25 11/21/2016 0913   CREATININE 1.21 (H) 12/06/2016 0459   CREATININE 1.30 (H) 05/03/2016 1011   CALCIUM 8.9 12/04/2016 0316   PROT 6.2 (L) 11/27/2016 0244   ALBUMIN 3.6 11/27/2016 0244   AST 24 11/27/2016 0244   ALT 32 11/27/2016 0244   ALKPHOS 46 11/27/2016 0244   BILITOT 0.6 11/27/2016 0244   GFRNONAA 42 (L) 12/06/2016 0459   GFRAA 49 (L) 12/06/2016 0459   Lab Results  Component Value Date   CHOL 166 11/27/2016   HDL 41 11/27/2016   LDLCALC 89 11/27/2016   TRIG 181 (H) 11/27/2016   CHOLHDL 4.0 11/27/2016   Lab Results  Component Value Date   HGBA1C 7.6 (H) 11/28/2016    Lab Results  Component Value Date   TSH 0.40 11/07/2016      ASSESSMENT AND PLAN  76 y.o. year old female  here for follow-up after left brain infarct post CABG procedure embolic  pattern . Etiology unclear but could be related to procedure patient has history of previous atrial fibrillation and history of status post MAZE. She was also hypotensive and anemic with hemoglobin of 6.6. LDL 89 hemoglobin A1c 7.6.  She has a new complaint today of hand numbness bilateral,  Intermittent pain in her hands that may wake her up at night Probable post stroke related.  PLAN: Stressed the importance of management of risk factors to prevent further stroke Continue Coumadin and aspirin for secondary stroke prevention  Maintain strict control of hypertension with blood pressure goal below 130/90, today's reading 150/90 , patient has not taken meds yet,  continue antihypertensive medications Control of diabetes with hemoglobin A1c below 6.5 followed by primary care  she is on Mediterranean by diet primary care, she is supposed to walk for exercise Cholesterol with LDL cholesterol less than 70, followed by primary care,   continue Crestor Exercise by walking, water aerobics,  eat healthy diet with whole grains,  fresh fruits and vegetables Gabapentin for hand numbness and pain at hs  Follow up in 6 months, if stable from a neuro standpoint will discharge I spent 25 min in total face to face time with the patient more than 50% of which was spent counseling and coordination of care, reviewing test results reviewing medications and discussing and reviewing the diagnosis of stroke and management of risk factors and also a new complaint of numbness in the hands and pain and further treatment options. , Rayburn Ma, [facility], APRN  [facility] [address] [place], [place] [address] [phone]

## 2017-05-14 NOTE — Progress Notes (Signed)
I reviewed above note and agree with the assessment and plan.  Rosalin Hawking, MD PhD Stroke Neurology 05/14/2017 7:20 AM

## 2017-06-06 ENCOUNTER — Ambulatory Visit (INDEPENDENT_AMBULATORY_CARE_PROVIDER_SITE_OTHER): Payer: Medicare Other | Admitting: *Deleted

## 2017-06-06 DIAGNOSIS — I4891 Unspecified atrial fibrillation: Secondary | ICD-10-CM | POA: Diagnosis not present

## 2017-06-06 LAB — POCT INR: INR: 2.4

## 2017-06-07 DIAGNOSIS — R200 Anesthesia of skin: Secondary | ICD-10-CM | POA: Diagnosis not present

## 2017-06-07 DIAGNOSIS — R202 Paresthesia of skin: Secondary | ICD-10-CM | POA: Diagnosis not present

## 2017-06-18 ENCOUNTER — Ambulatory Visit (INDEPENDENT_AMBULATORY_CARE_PROVIDER_SITE_OTHER): Payer: Medicare Other | Admitting: *Deleted

## 2017-06-18 DIAGNOSIS — I4891 Unspecified atrial fibrillation: Secondary | ICD-10-CM | POA: Diagnosis not present

## 2017-06-18 LAB — POCT INR: INR: 5

## 2017-06-21 DIAGNOSIS — R202 Paresthesia of skin: Secondary | ICD-10-CM | POA: Diagnosis not present

## 2017-06-21 DIAGNOSIS — R200 Anesthesia of skin: Secondary | ICD-10-CM | POA: Diagnosis not present

## 2017-06-23 DIAGNOSIS — I6340 Cerebral infarction due to embolism of unspecified cerebral artery: Secondary | ICD-10-CM | POA: Diagnosis not present

## 2017-06-23 DIAGNOSIS — I6930 Unspecified sequelae of cerebral infarction: Secondary | ICD-10-CM | POA: Diagnosis not present

## 2017-06-23 DIAGNOSIS — I4891 Unspecified atrial fibrillation: Secondary | ICD-10-CM | POA: Diagnosis not present

## 2017-06-23 DIAGNOSIS — E785 Hyperlipidemia, unspecified: Secondary | ICD-10-CM | POA: Diagnosis not present

## 2017-06-25 ENCOUNTER — Ambulatory Visit (INDEPENDENT_AMBULATORY_CARE_PROVIDER_SITE_OTHER): Payer: Medicare Other | Admitting: *Deleted

## 2017-06-25 DIAGNOSIS — I4891 Unspecified atrial fibrillation: Secondary | ICD-10-CM | POA: Diagnosis not present

## 2017-06-25 LAB — POCT INR: INR: 1.6

## 2017-07-02 DIAGNOSIS — H40031 Anatomical narrow angle, right eye: Secondary | ICD-10-CM | POA: Diagnosis not present

## 2017-07-02 DIAGNOSIS — H348322 Tributary (branch) retinal vein occlusion, left eye, stable: Secondary | ICD-10-CM | POA: Diagnosis not present

## 2017-07-02 DIAGNOSIS — H401231 Low-tension glaucoma, bilateral, mild stage: Secondary | ICD-10-CM | POA: Diagnosis not present

## 2017-07-02 DIAGNOSIS — H47233 Glaucomatous optic atrophy, bilateral: Secondary | ICD-10-CM | POA: Diagnosis not present

## 2017-07-02 DIAGNOSIS — H2511 Age-related nuclear cataract, right eye: Secondary | ICD-10-CM | POA: Diagnosis not present

## 2017-07-03 ENCOUNTER — Emergency Department (HOSPITAL_COMMUNITY): Payer: Medicare Other

## 2017-07-03 ENCOUNTER — Inpatient Hospital Stay (HOSPITAL_COMMUNITY): Payer: Medicare Other

## 2017-07-03 ENCOUNTER — Encounter (HOSPITAL_COMMUNITY): Payer: Self-pay

## 2017-07-03 ENCOUNTER — Inpatient Hospital Stay (HOSPITAL_COMMUNITY)
Admission: EM | Admit: 2017-07-03 | Discharge: 2017-07-05 | DRG: 065 | Disposition: A | Discharge status: Home / Self Care | Payer: Medicare Other | Attending: Internal Medicine | Admitting: Internal Medicine

## 2017-07-03 DIAGNOSIS — Z7901 Long term (current) use of anticoagulants: Secondary | ICD-10-CM | POA: Diagnosis not present

## 2017-07-03 DIAGNOSIS — Z951 Presence of aortocoronary bypass graft: Secondary | ICD-10-CM | POA: Diagnosis not present

## 2017-07-03 DIAGNOSIS — R531 Weakness: Secondary | ICD-10-CM | POA: Diagnosis not present

## 2017-07-03 DIAGNOSIS — I480 Paroxysmal atrial fibrillation: Secondary | ICD-10-CM | POA: Diagnosis not present

## 2017-07-03 DIAGNOSIS — I482 Chronic atrial fibrillation, unspecified: Secondary | ICD-10-CM

## 2017-07-03 DIAGNOSIS — I129 Hypertensive chronic kidney disease with stage 1 through stage 4 chronic kidney disease, or unspecified chronic kidney disease: Secondary | ICD-10-CM | POA: Diagnosis not present

## 2017-07-03 DIAGNOSIS — E1136 Type 2 diabetes mellitus with diabetic cataract: Secondary | ICD-10-CM | POA: Diagnosis present

## 2017-07-03 DIAGNOSIS — I2583 Coronary atherosclerosis due to lipid rich plaque: Secondary | ICD-10-CM | POA: Diagnosis not present

## 2017-07-03 DIAGNOSIS — I472 Ventricular tachycardia: Secondary | ICD-10-CM | POA: Diagnosis not present

## 2017-07-03 DIAGNOSIS — R471 Dysarthria and anarthria: Secondary | ICD-10-CM | POA: Diagnosis present

## 2017-07-03 DIAGNOSIS — I4891 Unspecified atrial fibrillation: Secondary | ICD-10-CM | POA: Diagnosis not present

## 2017-07-03 DIAGNOSIS — E785 Hyperlipidemia, unspecified: Secondary | ICD-10-CM | POA: Diagnosis not present

## 2017-07-03 DIAGNOSIS — I6523 Occlusion and stenosis of bilateral carotid arteries: Secondary | ICD-10-CM | POA: Diagnosis not present

## 2017-07-03 DIAGNOSIS — I10 Essential (primary) hypertension: Secondary | ICD-10-CM | POA: Diagnosis not present

## 2017-07-03 DIAGNOSIS — I6310 Cerebral infarction due to embolism of unspecified precerebral artery: Secondary | ICD-10-CM | POA: Diagnosis not present

## 2017-07-03 DIAGNOSIS — S199XXA Unspecified injury of neck, initial encounter: Secondary | ICD-10-CM | POA: Diagnosis not present

## 2017-07-03 DIAGNOSIS — N390 Urinary tract infection, site not specified: Secondary | ICD-10-CM | POA: Insufficient documentation

## 2017-07-03 DIAGNOSIS — M4802 Spinal stenosis, cervical region: Secondary | ICD-10-CM | POA: Diagnosis not present

## 2017-07-03 DIAGNOSIS — I6340 Cerebral infarction due to embolism of unspecified cerebral artery: Secondary | ICD-10-CM | POA: Diagnosis not present

## 2017-07-03 DIAGNOSIS — K219 Gastro-esophageal reflux disease without esophagitis: Secondary | ICD-10-CM | POA: Diagnosis present

## 2017-07-03 DIAGNOSIS — R4781 Slurred speech: Secondary | ICD-10-CM | POA: Diagnosis not present

## 2017-07-03 DIAGNOSIS — R29705 NIHSS score 5: Secondary | ICD-10-CM | POA: Diagnosis not present

## 2017-07-03 DIAGNOSIS — E1122 Type 2 diabetes mellitus with diabetic chronic kidney disease: Secondary | ICD-10-CM | POA: Diagnosis not present

## 2017-07-03 DIAGNOSIS — Z8673 Personal history of transient ischemic attack (TIA), and cerebral infarction without residual deficits: Secondary | ICD-10-CM | POA: Diagnosis not present

## 2017-07-03 DIAGNOSIS — I2510 Atherosclerotic heart disease of native coronary artery without angina pectoris: Secondary | ICD-10-CM | POA: Diagnosis not present

## 2017-07-03 DIAGNOSIS — M1991 Primary osteoarthritis, unspecified site: Secondary | ICD-10-CM | POA: Diagnosis present

## 2017-07-03 DIAGNOSIS — I63449 Cerebral infarction due to embolism of unspecified cerebellar artery: Principal | ICD-10-CM | POA: Diagnosis present

## 2017-07-03 DIAGNOSIS — Z79899 Other long term (current) drug therapy: Secondary | ICD-10-CM

## 2017-07-03 DIAGNOSIS — Z7982 Long term (current) use of aspirin: Secondary | ICD-10-CM

## 2017-07-03 DIAGNOSIS — N183 Chronic kidney disease, stage 3 unspecified: Secondary | ICD-10-CM | POA: Diagnosis present

## 2017-07-03 DIAGNOSIS — I700 Atherosclerosis of aorta: Secondary | ICD-10-CM | POA: Diagnosis not present

## 2017-07-03 DIAGNOSIS — R29818 Other symptoms and signs involving the nervous system: Secondary | ICD-10-CM | POA: Diagnosis not present

## 2017-07-03 DIAGNOSIS — I639 Cerebral infarction, unspecified: Secondary | ICD-10-CM

## 2017-07-03 DIAGNOSIS — I6789 Other cerebrovascular disease: Secondary | ICD-10-CM | POA: Diagnosis not present

## 2017-07-03 LAB — I-STAT TROPONIN, ED: Troponin i, poc: 0 ng/mL (ref 0.00–0.08)

## 2017-07-03 LAB — ETHANOL

## 2017-07-03 LAB — I-STAT CHEM 8, ED
BUN: 29 mg/dL — ABNORMAL HIGH (ref 6–20)
CHLORIDE: 102 mmol/L (ref 101–111)
Calcium, Ion: 1.1 mmol/L — ABNORMAL LOW (ref 1.15–1.40)
Creatinine, Ser: 1.1 mg/dL — ABNORMAL HIGH (ref 0.44–1.00)
Glucose, Bld: 283 mg/dL — ABNORMAL HIGH (ref 65–99)
HCT: 36 % (ref 36.0–46.0)
Hemoglobin: 12.2 g/dL (ref 12.0–15.0)
POTASSIUM: 3.7 mmol/L (ref 3.5–5.1)
SODIUM: 139 mmol/L (ref 135–145)
TCO2: 26 mmol/L (ref 0–100)

## 2017-07-03 LAB — URINALYSIS, ROUTINE W REFLEX MICROSCOPIC
Bilirubin Urine: NEGATIVE
Ketones, ur: NEGATIVE mg/dL
Nitrite: NEGATIVE
PROTEIN: 30 mg/dL — AB
Specific Gravity, Urine: 1.015 (ref 1.005–1.030)
pH: 6 (ref 5.0–8.0)

## 2017-07-03 LAB — APTT: APTT: 36 s (ref 24–36)

## 2017-07-03 LAB — CBC
HCT: 37.5 % (ref 36.0–46.0)
Hemoglobin: 12 g/dL (ref 12.0–15.0)
MCH: 26.9 pg (ref 26.0–34.0)
MCHC: 32 g/dL (ref 30.0–36.0)
MCV: 84.1 fL (ref 78.0–100.0)
PLATELETS: 221 10*3/uL (ref 150–400)
RBC: 4.46 MIL/uL (ref 3.87–5.11)
RDW: 16.3 % — AB (ref 11.5–15.5)
WBC: 7.8 10*3/uL (ref 4.0–10.5)

## 2017-07-03 LAB — DIFFERENTIAL
BASOS ABS: 0 10*3/uL (ref 0.0–0.1)
BASOS PCT: 0 %
Eosinophils Absolute: 0.2 10*3/uL (ref 0.0–0.7)
Eosinophils Relative: 3 %
Lymphocytes Relative: 20 %
Lymphs Abs: 1.6 10*3/uL (ref 0.7–4.0)
MONO ABS: 0.6 10*3/uL (ref 0.1–1.0)
Monocytes Relative: 8 %
NEUTROS ABS: 5.4 10*3/uL (ref 1.7–7.7)
Neutrophils Relative %: 69 %

## 2017-07-03 LAB — COMPREHENSIVE METABOLIC PANEL
ALT: 99 U/L — AB (ref 14–54)
AST: 61 U/L — AB (ref 15–41)
Albumin: 3.6 g/dL (ref 3.5–5.0)
Alkaline Phosphatase: 78 U/L (ref 38–126)
Anion gap: 8 (ref 5–15)
BUN: 24 mg/dL — ABNORMAL HIGH (ref 6–20)
CHLORIDE: 104 mmol/L (ref 101–111)
CO2: 24 mmol/L (ref 22–32)
CREATININE: 1.44 mg/dL — AB (ref 0.44–1.00)
Calcium: 8.6 mg/dL — ABNORMAL LOW (ref 8.9–10.3)
GFR calc non Af Amer: 34 mL/min — ABNORMAL LOW (ref >60)
GFR, EST AFRICAN AMERICAN: 39 mL/min — AB (ref >60)
Glucose, Bld: 285 mg/dL — ABNORMAL HIGH (ref 65–99)
Potassium: 4.2 mmol/L (ref 3.5–5.1)
SODIUM: 136 mmol/L (ref 135–145)
Total Bilirubin: 1.5 mg/dL — ABNORMAL HIGH (ref 0.3–1.2)
Total Protein: 6.1 g/dL — ABNORMAL LOW (ref 6.5–8.1)

## 2017-07-03 LAB — RAPID URINE DRUG SCREEN, HOSP PERFORMED
AMPHETAMINES: NOT DETECTED
BENZODIAZEPINES: NOT DETECTED
Barbiturates: NOT DETECTED
COCAINE: NOT DETECTED
Opiates: NOT DETECTED
Tetrahydrocannabinol: NOT DETECTED

## 2017-07-03 LAB — CBG MONITORING, ED: GLUCOSE-CAPILLARY: 232 mg/dL — AB (ref 65–99)

## 2017-07-03 LAB — PROTIME-INR
INR: 3.51
PROTHROMBIN TIME: 36 s — AB (ref 11.4–15.2)

## 2017-07-03 MED ORDER — LATANOPROST 0.005 % OP SOLN
1.0000 [drp] | Freq: Every day | OPHTHALMIC | Status: DC
  Administered 2017-07-03 – 2017-07-04 (×2): 1 [drp] via OPHTHALMIC
  Filled 2017-07-03: qty 2.5

## 2017-07-03 MED ORDER — CARVEDILOL 12.5 MG PO TABS
12.5000 mg | ORAL_TABLET | Freq: Two times a day (BID) | ORAL | Status: DC
  Administered 2017-07-04 – 2017-07-05 (×4): 12.5 mg via ORAL
  Filled 2017-07-03 (×5): qty 1

## 2017-07-03 MED ORDER — ONDANSETRON HCL 4 MG/2ML IJ SOLN: 4.0000 mg | Freq: Three times a day (TID) | INTRAMUSCULAR | Status: DC | PRN

## 2017-07-03 MED ORDER — SODIUM CHLORIDE 0.9 % IV SOLN
INTRAVENOUS | Status: DC
  Administered 2017-07-03: via INTRAVENOUS

## 2017-07-03 MED ORDER — LORAZEPAM 2 MG/ML IJ SOLN
0.5000 mg | Freq: Once | INTRAMUSCULAR | Status: AC
  Administered 2017-07-03: 0.5 mg via INTRAVENOUS
  Filled 2017-07-03: qty 1

## 2017-07-03 MED ORDER — PANTOPRAZOLE SODIUM 40 MG PO TBEC
40.0000 mg | DELAYED_RELEASE_TABLET | Freq: Every day | ORAL | Status: DC
  Administered 2017-07-04 – 2017-07-05 (×2): 40 mg via ORAL
  Filled 2017-07-03 (×2): qty 1

## 2017-07-03 MED ORDER — ACETAMINOPHEN 325 MG PO TABS: 650.0000 mg | ORAL_TABLET | Freq: Four times a day (QID) | ORAL | Status: DC | PRN

## 2017-07-03 MED ORDER — ROSUVASTATIN CALCIUM 20 MG PO TABS
20.0000 mg | ORAL_TABLET | Freq: Every day | ORAL | Status: DC
  Administered 2017-07-04 – 2017-07-05 (×2): 20 mg via ORAL
  Filled 2017-07-03 (×2): qty 1

## 2017-07-03 MED ORDER — STROKE: EARLY STAGES OF RECOVERY BOOK
Freq: Once | Status: AC
  Administered 2017-07-03
  Filled 2017-07-03: qty 1

## 2017-07-03 MED ORDER — ZOLPIDEM TARTRATE 5 MG PO TABS: 5.0000 mg | ORAL_TABLET | Freq: Every evening | ORAL | Status: DC | PRN

## 2017-07-03 MED ORDER — INSULIN ASPART 100 UNIT/ML ~~LOC~~ SOLN
0.0000 [IU] | Freq: Three times a day (TID) | SUBCUTANEOUS | Status: DC
  Administered 2017-07-04: 1 [IU] via SUBCUTANEOUS
  Administered 2017-07-04 (×2): 2 [IU] via SUBCUTANEOUS
  Administered 2017-07-05: 3 [IU] via SUBCUTANEOUS
  Administered 2017-07-05: 2 [IU] via SUBCUTANEOUS

## 2017-07-03 MED ORDER — POTASSIUM CHLORIDE CRYS ER 10 MEQ PO TBCR
10.0000 meq | EXTENDED_RELEASE_TABLET | Freq: Every day | ORAL | Status: DC
  Administered 2017-07-04 – 2017-07-05 (×2): 10 meq via ORAL
  Filled 2017-07-03 (×3): qty 1

## 2017-07-03 MED ORDER — ASPIRIN EC 81 MG PO TBEC
81.0000 mg | DELAYED_RELEASE_TABLET | Freq: Every day | ORAL | Status: DC
  Administered 2017-07-04: 81 mg via ORAL
  Filled 2017-07-03: qty 1

## 2017-07-03 MED ORDER — GABAPENTIN 100 MG PO CAPS
100.0000 mg | ORAL_CAPSULE | Freq: Every day | ORAL | Status: DC
  Administered 2017-07-03 – 2017-07-04 (×2): 100 mg via ORAL
  Filled 2017-07-03 (×2): qty 1

## 2017-07-03 MED ORDER — SENNOSIDES-DOCUSATE SODIUM 8.6-50 MG PO TABS: 1.0000 | ORAL_TABLET | Freq: Every evening | ORAL | Status: DC | PRN

## 2017-07-03 MED ORDER — FUROSEMIDE 20 MG PO TABS: 20.0000 mg | ORAL_TABLET | Freq: Every day | ORAL | Status: DC

## 2017-07-03 MED ORDER — INSULIN ASPART 100 UNIT/ML ~~LOC~~ SOLN
0.0000 [IU] | Freq: Every day | SUBCUTANEOUS | Status: DC
  Administered 2017-07-03: 2 [IU] via SUBCUTANEOUS
  Filled 2017-07-03: qty 1

## 2017-07-03 MED ORDER — DILTIAZEM HCL ER COATED BEADS 240 MG PO CP24
240.0000 mg | ORAL_CAPSULE | Freq: Every day | ORAL | Status: DC
  Administered 2017-07-03 – 2017-07-05 (×3): 240 mg via ORAL
  Filled 2017-07-03 (×3): qty 1

## 2017-07-03 MED ORDER — WARFARIN - PHARMACIST DOSING INPATIENT: Freq: Every day | Status: DC

## 2017-07-03 MED ORDER — DEXTROSE 5 % IV SOLN: 1.0000 g | INTRAVENOUS | Status: DC
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
## 2017-07-03 NOTE — Progress Notes (Signed)
ANTICOAGULATION CONSULT NOTE - Initial Consult  Pharmacy Consult for Warfarin Indication: atrial fibrillation  No Known Allergies  Patient Measurements: Height: 5' (152.4 cm) Weight: 154 lb 8.7 oz (70.1 kg) IBW/kg (Calculated) : 45.5  Vital Signs: Temp: 98.7 F (37.1 C) (08/16 1816) Temp Source: Oral (08/16 1816) BP: 154/75 (08/16 1816) Pulse Rate: 75 (08/16 1816)  Labs:  Recent Labs  07/03/17 1743 07/03/17 1754  HGB 12.0 12.2  HCT 37.5 36.0  PLT 221  --   APTT 36  --   LABPROT 36.0*  --   INR 3.51  --   CREATININE 1.44* 1.10*    Estimated Creatinine Clearance: 36.8 mL/min (A) (by C-G formula based on SCr of 1.1 mg/dL (H)).   Medical History: Past Medical History:  Diagnosis Date  . Anginal pain ([place])   . Arthritis   . Coronary artery disease   . Diabetes mellitus without complication ([place])   . Dysrhythmia    atrail fibrillation  . GERD (gastroesophageal reflux disease)   . Glaucoma    bilat   . Hyperlipidemia   . Hypertension   . Kidney stone   . Urinary tract infection   . Wears glasses     Assessment: 52 YOF who presented on 8/16 as a code stroke with sudden onset left face and arm weakness. The patient's CT was negative on admission for bleed. Admit INR was elevated at 3.51. The patient was on warfarin PTA for hx Afib and has now been consulted to resume dosing this admission.   Of noting, the patient's INR was low on 8/8 at 1.6. The patient's PTA warfarin dose per clinic notes were 7.5 mg daily EXCEPT for 3.75 mg on MWF.   Goal of Therapy:  INR 2-3 Monitor platelets by anticoagulation protocol: Yes   Plan:  1. Hold warfarin dose this evening 2. Daily PT/INR, CBC q3d 3. Will continue to monitor for any signs/symptoms of bleeding and will follow up with PT/INR in the a.m.   Thank you for allowing pharmacy to be a part of this patient's care.  Alycia Rossetti, PharmD, BCPS Clinical Pharmacist Pager: [phone] Clinical phone for 07/03/2017  from 2-11p: [phone] If after 3:30p, please call main pharmacy at: [phone] 07/03/2017 8:39 PM

## 2017-07-03 NOTE — ED Notes (Signed)
Attempted report x1. RN unavailable @ this time. 

## 2017-07-03 NOTE — ED Notes (Signed)
Patient transported to MRI 

## 2017-07-03 NOTE — ED Triage Notes (Addendum)
Pt brought in by EMS due to having slurred speech, left sided facial droop, and left sided weakness. Pt a&ox4. Pt takes coumadin. LSN was 16:30.

## 2017-07-04 ENCOUNTER — Inpatient Hospital Stay (HOSPITAL_COMMUNITY): Payer: Medicare Other

## 2017-07-04 DIAGNOSIS — I480 Paroxysmal atrial fibrillation: Secondary | ICD-10-CM

## 2017-07-04 DIAGNOSIS — E785 Hyperlipidemia, unspecified: Secondary | ICD-10-CM

## 2017-07-04 DIAGNOSIS — I2510 Atherosclerotic heart disease of native coronary artery without angina pectoris: Secondary | ICD-10-CM

## 2017-07-04 LAB — BLOOD CULTURE ID PANEL (REFLEXED)
ACINETOBACTER BAUMANNII: NOT DETECTED
CANDIDA GLABRATA: NOT DETECTED
CANDIDA KRUSEI: NOT DETECTED
CANDIDA TROPICALIS: NOT DETECTED
CARBAPENEM RESISTANCE: NOT DETECTED
Candida albicans: NOT DETECTED
Candida parapsilosis: NOT DETECTED
ESCHERICHIA COLI: NOT DETECTED
Enterobacter cloacae complex: NOT DETECTED
Enterobacteriaceae species: NOT DETECTED
Enterococcus species: NOT DETECTED
Haemophilus influenzae: NOT DETECTED
KLEBSIELLA OXYTOCA: NOT DETECTED
Klebsiella pneumoniae: NOT DETECTED
LISTERIA MONOCYTOGENES: NOT DETECTED
Methicillin resistance: DETECTED — AB
NEISSERIA MENINGITIDIS: NOT DETECTED
PROTEUS SPECIES: NOT DETECTED
Pseudomonas aeruginosa: NOT DETECTED
SERRATIA MARCESCENS: NOT DETECTED
STAPHYLOCOCCUS SPECIES: DETECTED — AB
STREPTOCOCCUS PYOGENES: NOT DETECTED
Staphylococcus aureus (BCID): NOT DETECTED
Streptococcus agalactiae: NOT DETECTED
Streptococcus pneumoniae: NOT DETECTED
Streptococcus species: NOT DETECTED
Vancomycin resistance: NOT DETECTED

## 2017-07-04 LAB — LIPID PANEL
CHOLESTEROL: 96 mg/dL (ref 0–200)
HDL: 33 mg/dL — ABNORMAL LOW (ref >40)
LDL Cholesterol: 45 mg/dL (ref 0–99)
Total CHOL/HDL Ratio: 2.9 RATIO
Triglycerides: 91 mg/dL (ref <150)
VLDL: 18 mg/dL (ref 0–40)

## 2017-07-04 LAB — PROTIME-INR
INR: 2.39
PROTHROMBIN TIME: 26.5 s — AB (ref 11.4–15.2)

## 2017-07-04 LAB — COMPREHENSIVE METABOLIC PANEL
ALBUMIN: 3.6 g/dL (ref 3.5–5.0)
ALK PHOS: 76 U/L (ref 38–126)
ALT: 93 U/L — ABNORMAL HIGH (ref 14–54)
ANION GAP: 10 (ref 5–15)
AST: 41 U/L (ref 15–41)
BUN: 15 mg/dL (ref 6–20)
CHLORIDE: 104 mmol/L (ref 101–111)
CO2: 23 mmol/L (ref 22–32)
Calcium: 8.7 mg/dL — ABNORMAL LOW (ref 8.9–10.3)
Creatinine, Ser: 1.2 mg/dL — ABNORMAL HIGH (ref 0.44–1.00)
GFR calc non Af Amer: 42 mL/min — ABNORMAL LOW (ref >60)
GFR, EST AFRICAN AMERICAN: 49 mL/min — AB (ref >60)
GLUCOSE: 282 mg/dL — AB (ref 65–99)
POTASSIUM: 3.5 mmol/L (ref 3.5–5.1)
SODIUM: 137 mmol/L (ref 135–145)
Total Bilirubin: 0.8 mg/dL (ref 0.3–1.2)
Total Protein: 6.3 g/dL — ABNORMAL LOW (ref 6.5–8.1)

## 2017-07-04 LAB — CBC WITH DIFFERENTIAL/PLATELET
BASOS ABS: 0 10*3/uL (ref 0.0–0.1)
Basophils Relative: 0 %
Eosinophils Absolute: 0.3 10*3/uL (ref 0.0–0.7)
Eosinophils Relative: 3 %
HCT: 38.5 % (ref 36.0–46.0)
HEMOGLOBIN: 12.4 g/dL (ref 12.0–15.0)
LYMPHS ABS: 1.5 10*3/uL (ref 0.7–4.0)
LYMPHS PCT: 16 %
MCH: 26.5 pg (ref 26.0–34.0)
MCHC: 32.2 g/dL (ref 30.0–36.0)
MCV: 82.3 fL (ref 78.0–100.0)
Monocytes Absolute: 0.6 10*3/uL (ref 0.1–1.0)
Monocytes Relative: 7 %
NEUTROS ABS: 6.8 10*3/uL (ref 1.7–7.7)
NEUTROS PCT: 74 %
PLATELETS: 222 10*3/uL (ref 150–400)
RBC: 4.68 MIL/uL (ref 3.87–5.11)
RDW: 15.9 % — ABNORMAL HIGH (ref 11.5–15.5)
WBC: 9.2 10*3/uL (ref 4.0–10.5)

## 2017-07-04 LAB — ECHOCARDIOGRAM COMPLETE
HEIGHTINCHES: 60 in
Weight: 2472.68 oz

## 2017-07-04 LAB — GLUCOSE, CAPILLARY
GLUCOSE-CAPILLARY: 125 mg/dL — AB (ref 65–99)
GLUCOSE-CAPILLARY: 189 mg/dL — AB (ref 65–99)
GLUCOSE-CAPILLARY: 184 mg/dL — AB (ref 65–99)
GLUCOSE-CAPILLARY: 168 mg/dL — AB (ref 65–99)

## 2017-07-04 LAB — PHOSPHORUS: PHOSPHORUS: 2.9 mg/dL (ref 2.5–4.6)

## 2017-07-04 LAB — MAGNESIUM: Magnesium: 1.8 mg/dL (ref 1.7–2.4)

## 2017-07-04 LAB — HEMOGLOBIN A1C
HEMOGLOBIN A1C: 8.8 % — AB (ref 4.8–5.6)
MEAN PLASMA GLUCOSE: 205.86 mg/dL

## 2017-07-04 MED ORDER — WARFARIN SODIUM 5 MG PO TABS
5.0000 mg | ORAL_TABLET | Freq: Once | ORAL | Status: AC
  Administered 2017-07-04: 5 mg via ORAL
  Filled 2017-07-04: qty 1

## 2017-07-04 MED ORDER — VITAMIN K1 10 MG/ML IJ SOLN
10.0000 mg | Freq: Once | INTRAVENOUS | Status: DC
  Filled 2017-07-04: qty 1
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
## 2017-07-04 NOTE — Evaluation (Signed)
Speech Language Pathology Evaluation Patient Details Name: Audrey Mullen MRN: [id number] DOB: [birth year] Today's Date: 07/04/2017 Time: 1025-8527 SLP Time Calculation (min) (ACUTE ONLY): 22 min  Problem List:  Patient Active Problem List   Diagnosis Date Noted  . Stroke (cerebrum) ([place]) 07/03/2017  . GERD (gastroesophageal reflux disease) 07/03/2017  . CAD (coronary artery disease) 07/03/2017  . UTI (urinary tract infection) 07/03/2017  . CKD (chronic kidney disease), stage III 07/03/2017  . HTN (hypertension) 02/10/2017  . Hyperlipemia 02/10/2017  . Cerebral embolism with cerebral infarction 11/30/2016  . 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 Coronary artery disease due to lipid rich plaque 11/25/2016  . 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[place])   . Abnormal nuclear stress test   . Bacteremia   . Hydronephrosis   . Ureteral stone with hydronephrosis   . Sepsis ([place]) 04/02/2016  . AKI (acute kidney injury) ([place]) 04/02/2016  . Pyelonephritis, acute 04/02/2016  . Atrial fibrillation ([place]) 04/02/2016   Past Medical History:  Past Medical History:  Diagnosis Date  . Anginal pain ([place])   . Arthritis   . Coronary artery disease   . Diabetes mellitus without complication ([place])   . Dysrhythmia    atrail fibrillation  . GERD (gastroesophageal reflux disease)   . Glaucoma    bilat   . Hyperlipidemia   . Hypertension   . Kidney stone   . Urinary tract infection   . Wears glasses    Past Surgical History:  Past Surgical History:  Procedure Laterality Date  . CARDIAC CATHETERIZATION N/A 11/25/2016   Procedure: Left Heart Cath and Coronary Angiography;  Surgeon: Lorretta Harp, MD;  Location: [place] [facility];  Service: Cardiovascular;  Laterality: N/A;  . CORONARY ARTERY BYPASS GRAFT N/A 11/28/2016   Procedure: CORONARY ARTERY BYPASS GRAFTING (CABG) x 4;  Surgeon: Melrose Nakayama, MD;  Location: [place];  Service: Open Heart Surgery;  Laterality: N/A;  . CYSTOSCOPY WITH RETROGRADE  PYELOGRAM, URETEROSCOPY AND STENT PLACEMENT Left 06/03/2016   Procedure: CYSTOSCOPY WITH LEFT RETROGRADE PYELOGRAM, URETEROSCOPY, REMOVAL OF LEFT NEPHROSTOMY TUBE, INSERTION OF DOUBLE J STENT, LEFT;  Surgeon: Carolan Clines, MD;  Location: [facility];  Service: Urology;  Laterality: Left;  . ENDOVEIN HARVEST OF GREATER SAPHENOUS VEIN Right 11/28/2016   Procedure: ENDOVEIN HARVEST OF GREATER SAPHENOUS VEIN;  Surgeon: Melrose Nakayama, MD;  Location: [facility];  Service: Open Heart Surgery;  Laterality: Right;  . EYE SURGERY     cataract removed per left eye   . HOLMIUM LASER APPLICATION Left 7/82/4235   Procedure: HOLMIUM LASER OF STONE ;  Surgeon: Carolan Clines, MD;  Location: [facility];  Service: Urology;  Laterality: Left;  Marland Kitchen MAZE N/A 11/28/2016   Procedure: MAZE;  Surgeon: Melrose Nakayama, MD;  Location: [facility];  Service: Open Heart Surgery;  Laterality: N/A;  . ROTATOR CUFF REPAIR    . TEE WITHOUT CARDIOVERSION N/A 11/28/2016   Procedure: TRANSESOPHAGEAL ECHOCARDIOGRAM (TEE);  Surgeon: Melrose Nakayama, MD;  Location: [facility];  Service: Open Heart Surgery;  Laterality: N/A;   HPI:  Audrey Dorval Watlingtonis a 76 y.o.femalewith medical history significant of hypertension, hyperlipidemia, diabetes mellitus, GERD, CAD, s/p of CABG, atrial fibrillation on Coumadin, prior CVA with mild dysarthria, CKD-3, who presents with left-sided weakness, slurred speech and left facial droop. Tiny RIGHT and parietal LEFT on subacute infarcts versus artifact.   Assessment / Plan / Recommendation Clinical Impression  Pt demonstrates mild dysarthria. She has a history of similar impairment from prior  CVA which has since resolved. She independently uses compensatory strategies with deliberate exercise, but does not always carry over slow rate and overarticualtion in conversation. Pt also noted to have mild memory deficts with working memory, Location manager. Visual cues and reminders are very helpful and  pt would benefit from learning new compensatory strategies given high level of function and activity PTA. Recommend f/u with OP SLP if deficits persist. Will f/u acutely for further itnerventions as needed.     SLP Assessment  SLP Recommendation/Assessment: Patient needs continued Speech Lanaguage Pathology Services SLP Visit Diagnosis: Attention and concentration deficit Attention and concentration deficit following: Cerebral infarction    Follow Up Recommendations  Outpatient SLP    Frequency and Duration min 2x/week  2 weeks      SLP Evaluation Cognition  Overall Cognitive Status: Within Functional Limits for tasks assessed Arousal/Alertness: Awake/alert Orientation Level: Oriented X4 Attention: Focused;Sustained;Alternating Focused Attention: Appears intact Sustained Attention: Appears intact Alternating Attention: Appears intact Memory: Impaired Memory Impairment: Storage deficit;Retrieval deficit Awareness: Appears intact Problem Solving: Impaired Problem Solving Impairment: Verbal complex Executive Function: Reasoning Reasoning: Impaired Reasoning Impairment: Verbal complex Safety/Judgment: Appears intact       Comprehension  Auditory Comprehension Overall Auditory Comprehension: Appears within functional limits for tasks assessed Reading Comprehension Reading Status: Within funtional limits    Expression Verbal Expression Overall Verbal Expression: Appears within functional limits for tasks assessed   Oral / Motor  Oral Motor/Sensory Function Overall Oral Motor/Sensory Function: Mild impairment Facial ROM: Reduced left Facial Symmetry: Abnormal symmetry left Facial Strength: Reduced left Lingual ROM: Within Functional Limits Lingual Symmetry: Within Functional Limits Lingual Strength: Within Functional Limits Lingual Sensation: Within Functional Limits Velum: Within Functional Limits Mandible: Within Functional Limits Motor Speech Overall Motor Speech:  Impaired Respiration: Within functional limits Phonation: Normal Resonance: Within functional limits Articulation: Impaired Level of Impairment: Conversation Intelligibility: Intelligible Motor Planning: Witnin functional limits Motor Speech Errors: Aware Effective Techniques: Slow rate;Increased vocal intensity;Over-articulate   GO                   Herbie [place], [place] CCC-SLP [phone]  Lynann Beaver 07/04/2017, 10:48 AM

## 2017-07-04 NOTE — Progress Notes (Signed)
PHARMACY - PHYSICIAN COMMUNICATION CRITICAL VALUE ALERT - BLOOD CULTURE IDENTIFICATION (BCID)  Results for orders placed or performed during the hospital encounter of 07/03/17  Blood Culture ID Panel (Reflexed) (Collected: 07/03/2017  9:18 PM)  Result Value Ref Range   Enterococcus species NOT DETECTED NOT DETECTED   Vancomycin resistance NOT DETECTED NOT DETECTED   Listeria monocytogenes NOT DETECTED NOT DETECTED   Staphylococcus species DETECTED (A) NOT DETECTED   Staphylococcus aureus NOT DETECTED NOT DETECTED   Methicillin resistance DETECTED (A) NOT DETECTED   Streptococcus species NOT DETECTED NOT DETECTED   Streptococcus agalactiae NOT DETECTED NOT DETECTED   Streptococcus pneumoniae NOT DETECTED NOT DETECTED   Streptococcus pyogenes NOT DETECTED NOT DETECTED   Acinetobacter baumannii NOT DETECTED NOT DETECTED   Enterobacteriaceae species NOT DETECTED NOT DETECTED   Enterobacter cloacae complex NOT DETECTED NOT DETECTED   Escherichia coli NOT DETECTED NOT DETECTED   Klebsiella oxytoca NOT DETECTED NOT DETECTED   Klebsiella pneumoniae NOT DETECTED NOT DETECTED   Proteus species NOT DETECTED NOT DETECTED   Serratia marcescens NOT DETECTED NOT DETECTED   Carbapenem resistance NOT DETECTED NOT DETECTED   Haemophilus influenzae NOT DETECTED NOT DETECTED   Neisseria meningitidis NOT DETECTED NOT DETECTED   Pseudomonas aeruginosa NOT DETECTED NOT DETECTED   Candida albicans NOT DETECTED NOT DETECTED   Candida glabrata NOT DETECTED NOT DETECTED   Candida krusei NOT DETECTED NOT DETECTED   Candida parapsilosis NOT DETECTED NOT DETECTED   Candida tropicalis NOT DETECTED NOT DETECTED    Name of physician (or Provider) Contacted: Dr Alfredia Ferguson  Changes to prescribed antibiotics required: WBC wnl, Afebrile BCID - 1/2 BCx CNS Would not recommend ABX treatment at this time.  Bonnita Nasuti Pharm.D. CPP, BCPS Clinical Pharmacist [phone] 07/04/2017 5:57 PM

## 2017-07-04 NOTE — Progress Notes (Addendum)
Reason for consult:   Subjective: Patient is almost back to her baseline;    ROS: negative except above   Examination  Vital signs in last 24 hours: Temp:  [97.5 F (36.4 C)-98.9 F (37.2 C)] 98.2 F (36.8 C) (08/17 1741) Pulse Rate:  [61-71] 70 (08/17 1741) Resp:  [18-20] 18 (08/17 1427) BP: (102-162)/(51-102) 108/55 (08/17 1741) SpO2:  [95 %-99 %] 95 % (08/17 1427)  General: Not in distress, cooperative CVS: pulse-normal rate and rhythm RS: breathing comfortably Extremities: normal   Neuro: MS: Alert, oriented, follows commands CN: pupils equal and reactive,  EOMI, mild NFF, tongue midline, normal sensation over face, Motor: 5/5 strength in all 4 extremities Reflexes: 2+ bilaterally over patella, biceps, plantars: flexor Coordination: normal Gait: not tested  Basic Metabolic Panel:  Recent Labs Lab 07/03/17 1743 07/03/17 1754 07/04/17 0857  NA 136 139 137  K 4.2 3.7 3.5  CL 104 102 104  CO2 24  --  23  GLUCOSE 285* 283* 282*  BUN 24* 29* 15  CREATININE 1.44* 1.10* 1.20*  CALCIUM 8.6*  --  8.7*  MG  --   --  1.8  PHOS  --   --  2.9    CBC:  Recent Labs Lab 07/03/17 1743 07/03/17 1754 07/04/17 0857  WBC 7.8  --  9.2  NEUTROABS 5.4  --  6.8  HGB 12.0 12.2 12.4  HCT 37.5 36.0 38.5  MCV 84.1  --  82.3  PLT 221  --  222     Coagulation Studies:  Recent Labs  07/03/17 1743 07/04/17 0511 07/04/17 0857  LABPROT 36.0* >90.0* 26.5*  INR 3.51 >10.00* 2.39    Imaging Reviewed:   CTA Head and neck: Bilateral atherosclerotic disease with no significant stenosis with moderate intracranial atherosclerosis.   MRI head: Tiny right parietal ? Vs artifact and left frontal subacute infarct.    ASSESSMENT AND PLAN  Audrey Mullen is 48 y F RH with Afib on coumadin, hypertension, diabetes, coronary artery disease presents as a stroke alert to the emergency room for dysarthria and left-sided weakness. CT head showed no hemorrhage and CTA head  and neck showed no large vessel occlusion. TPA was considered however not given due to the fact the patient was on Coumadin and INR was 3.    Acute Ischemic Stroke/TIA  Risk factors:  Afib on coumadin, hypertension, diabetes, coronary artery disease Etiology:cardiombolic   #Transthoracic Echo : EF 60 -65pc, no thrombus, no valvular disease  #continue Crestor 20 mg # BP goal: no longer needs permissive HTN, goal less than 140/90 # HBAIC 8.8 and Lipid profile 45 # Coumadin in subtherapeutic range and then supratherapuetic ( apparently due to interaction with steroids), currently therapeutic If patient is interested we can switch from Warfarin to Apixaban due to less interaction with other medications.  PT/OT and possibly discharge tomorrow

## 2017-07-04 NOTE — Progress Notes (Signed)
Critical lab value INR > 10..paged dr to notify

## 2017-07-04 NOTE — Progress Notes (Signed)
Inpatient Diabetes Program Recommendations  AACE/ADA: New Consensus Statement on Inpatient Glycemic Control (2015)  Target Ranges:  Prepandial:   less than 140 mg/dL      Peak postprandial:   less than 180 mg/dL (1-2 hours)      Critically ill patients:  140 - 180 mg/dL   Lab Results  Component Value Date   GLUCAP 189 (H) 07/04/2017   HGBA1C 8.8 (H) 07/04/2017    Review of Glycemic Control Results for JENIA, KLEPPER (MRN [id number]) as of 07/04/2017 12:39  Ref. Range 07/03/2017 22:32 07/04/2017 06:00 07/04/2017 12:10  Glucose-Capillary Latest Ref Range: 65 - 99 mg/dL 232 (H) 125 (H) 189 (H)   Diabetes history: DM2 Outpatient Diabetes medications: None Current orders for Inpatient glycemic control: Novolog correction 0-9 units tid + 0-5 units hs  Inpatient Diabetes Program Recommendations:   Noted patient was diagnosed as new type 2 DM 12/09/16 and seen by DM coordinator @ this time. Will follow.  Thank you, Nani Gasser. Jiya Kissinger, RN, MSN, CDE  Diabetes Coordinator Inpatient Glycemic Control Team Team Pager [phone] (8am-5pm) 07/04/2017 12:40 PM

## 2017-07-04 NOTE — Consult Note (Signed)
[facility] CM Primary Care Navigator  07/04/2017  [place] October 26, 1941 [id number]   Met withpatient at the bedside to identify possibledischarge needs.  Patient reports having ran up onto a curb when driving to the store and noted with slurred speech, left facial droop and left sided weaknessthat hadled to this admission.  Patient endorses Dr. Lucianne Lei with [facility] as her primary care provider.   [facility] [address] [facility] without difficulty so far.  Patient manages her own medications at home straight out of the containers.  She reports being able to drive prior to admission but husband Gwenlyn Perking) will be providing transportation to herdoctors' appointments after discharge.  Patient mentioned that son Mliss Fritz) lives with them. Both husband and son will be her caregivers at home once discharged.  Discharge plan isnot yet determined pending therapy recommendation and physician order.   Patient voiced understanding to call primary care provider's office when she returns back home for a post discharge follow-up appointment within a week or sooner if needs arise.Patient letter (with PCP's contact number) wasprovided as herreminder.  Explained to patientregardingTHN CM services available for further healthmanagement.Patient expressedunderstanding to seekreferral to [facility] care managementservicesfrom primary care provider ifdeemed necessary andappropriateat her next follow-up visit.   [facility] care management information provided for any future needs that she may have.  Primary care provider's office called [name]) and notified of patient's admission (stroke), need for transition of care after discharge and discussed health issues needing follow-up (elevated A1c levels). Made aware to refer patient to [facility] care management if deemed necessary for services.  For questions,  please contact:  Dannielle Huh, BSN, RN- [facility] Primary Care Navigator  Telephone: [phone] [place]

## 2017-07-04 NOTE — Progress Notes (Signed)
  Echocardiogram 2D Echocardiogram has been performed.  Audrey Mullen 07/04/2017, 10:01 AM

## 2017-07-04 NOTE — Progress Notes (Signed)
ANTICOAGULATION CONSULT NOTE - follow up Pharmacy Consult for Warfarin Indication: atrial fibrillation  No Known Allergies  Patient Measurements: Height: 5' (152.4 cm) Weight: 154 lb 8.7 oz (70.1 kg) IBW/kg (Calculated) : 45.5  Vital Signs: Temp: 98.9 F (37.2 C) (08/17 1427) Temp Source: Oral (08/17 1427) BP: 102/51 (08/17 1427) Pulse Rate: 61 (08/17 1427)  Labs:  Recent Labs  07/03/17 1743 07/03/17 1754 07/04/17 0511 07/04/17 0857  HGB 12.0 12.2  --  12.4  HCT 37.5 36.0  --  38.5  PLT 221  --   --  222  APTT 36  --   --   --   LABPROT 36.0*  --  >90.0* 26.5*  INR 3.51  --  >10.00* 2.39  CREATININE 1.44* 1.10*  --  1.20*    Estimated Creatinine Clearance: 33.7 mL/min (A) (by C-G formula based on SCr of 1.2 mg/dL (H)).   Medical History: Past Medical History:  Diagnosis Date  . Anginal pain ([place])   . Arthritis   . Coronary artery disease   . Diabetes mellitus without complication ([place])   . Dysrhythmia    atrail fibrillation  . GERD (gastroesophageal reflux disease)   . Glaucoma    bilat   . Hyperlipidemia   . Hypertension   . Kidney stone   . Urinary tract infection   . Wears glasses     Assessment: 63 YOF who presented on 8/16 as a code stroke with sudden onset left face and arm weakness. The patient's CT was negative on admission for bleed. Admit INR was elevated at 3.51. The patient was on warfarin PTA for hx Afib and pharmacy consulted to resume dosing this admission.   Today's AM INR reported initially as INR > 10 - Repeat INR =2.39 - Vit K cancelled. INR 2.39, therapeutic INR. CBCwnl, no bleeding noted +SCDs on    PTA warf dose: per clinic notes were 7.5 mg daily EXCEPT for 3.75 mg on MWF.   Outpatient anti-Coag visits: 8/8 INR = 1.6  after coumadin previously held x 2 days (8/1-8/2) then continued 7.5mg  daily except 3.75mg  qMWF  8/1 INR = 5.0 on  7.5mg  daily except 3.75mg  qMWF 7/20 INR =2.4 on  7.5mg  daily except 3.75mg  qMWF    Goal of  Therapy:  INR 2-3 Monitor platelets by anticoagulation protocol: Yes   Plan:  Coumadin 5mg  po tonight x1 Daily PT/INR, CBC q3d Monitor for any signs/symptoms of bleeding   Thank you for allowing pharmacy to be a part of this patient's care.  Nicole Cella, RPh Clinical Pharmacist Pager: [phone]-4P [phone] 4P-10P [phone] [place] [phone] 07/04/2017 5:07 PM

## 2017-07-04 NOTE — Evaluation (Signed)
Physical Therapy Evaluation Patient Details Name: Audrey Mullen MRN: [id number] DOB: 1941-07-24 Today's Date: 07/04/2017   History of Present Illness  Pt is a 76 y/o female admitted secondary to L sided weakness, slurred speech and L facial droop. 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 PMH including but not limited to HTN, HLD, DM, CAD s/p CABG, CKD and a-fib.  Clinical Impression  Pt presented supine in bed with HOB elevated, awake and willing to participate in therapy session. Prior to admission, pt reported that she is independent with all functional mobility and ADLs. Pt ambulated in hallway without use of an AD with min guard for safety. Pt demonstrated modest instability but no overt LOB or need for physical assistance. Pt also ascended and descended two steps with min guard for safety. PT will continue to follow acutely to ensure a safe d/c home.     Follow Up Recommendations No PT follow up    Equipment Recommendations  None recommended by PT    Recommendations for Other Services       Precautions / Restrictions Precautions Precautions: Fall Restrictions Weight Bearing Restrictions: No      Mobility  Bed Mobility Overal bed mobility: Needs Assistance Bed Mobility: Supine to Sit     Supine to sit: Supervision     General bed mobility comments: increased time, supervision for safety  Transfers Overall transfer level: Needs assistance Equipment used: None Transfers: Sit to/from Stand Sit to Stand: Min guard         General transfer comment: increased time, min guard for safety and stability with transition into standing from EOB  Ambulation/Gait Ambulation/Gait assistance: Min guard Ambulation Distance (Feet): 150 Feet Assistive device: None Gait Pattern/deviations: Step-through pattern;Decreased step length - right;Decreased step length - left;Decreased stride length;Trunk flexed;Shuffle;Narrow base of support Gait  velocity: decreased Gait velocity interpretation: Below normal speed for age/gender General Gait Details: modest instability but no overt LOB or need for physical assistance or use of AD, min guard for safety  Stairs Stairs: Yes Stairs assistance: Min guard Stair Management: Two rails;Step to pattern;Forwards Number of Stairs: 2 General stair comments: min guard for safety  Wheelchair Mobility    Modified Rankin (Stroke Patients Only) Modified Rankin (Stroke Patients Only) Pre-Morbid Rankin Score: No symptoms Modified Rankin: No significant disability     Balance Overall balance assessment: Needs assistance Sitting-balance support: Feet supported Sitting balance-Leahy Scale: Good Sitting balance - Comments: pt able to sit EOB and participate in MMT with supervision   Standing balance support: No upper extremity supported Standing balance-Leahy Scale: Fair                               Pertinent Vitals/Pain Pain Assessment: No/denies pain    Home Living Family/patient expects to be discharged to:: Private residence Living Arrangements: Spouse/significant other Available Help at Discharge: Family;Available 24 hours/day Type of Home: House Home Access: Stairs to enter Entrance Stairs-Rails: None Entrance Stairs-Number of Steps: 3 Home Layout: Multi-level Home Equipment: Walker - 2 wheels;Cane - single point;Shower seat      Prior Function Level of Independence: Independent               Hand Dominance   Dominant Hand: Right    Extremity/Trunk Assessment   Upper Extremity Assessment Upper Extremity Assessment: Defer to OT evaluation    Lower Extremity Assessment Lower Extremity Assessment: Overall WFL for tasks assessed  Communication   Communication: No difficulties  Cognition Arousal/Alertness: Awake/alert Behavior During Therapy: WFL for tasks assessed/performed Overall Cognitive Status: Within Functional Limits for tasks  assessed                                        General Comments      Exercises     Assessment/Plan    PT Assessment Patient needs continued PT services  PT Problem List Decreased balance;Decreased mobility;Decreased coordination;Decreased safety awareness       PT Treatment Interventions DME instruction;Gait training;Functional mobility training;Stair training;Therapeutic activities;Balance training;Therapeutic exercise;Neuromuscular re-education;Patient/family education    PT Goals (Current goals can be found in the Care Plan section)  Acute Rehab PT Goals Patient Stated Goal: return home and being active PT Goal Formulation: With patient Time For Goal Achievement: 07/18/17 Potential to Achieve Goals: Good    Frequency Min 4X/week   Barriers to discharge        Co-evaluation PT/OT/SLP Co-Evaluation/Treatment: Yes Reason for Co-Treatment: For patient/therapist safety;To address functional/ADL transfers PT goals addressed during session: Mobility/safety with mobility;Balance;Strengthening/ROM         AM-PAC PT "6 Clicks" Daily Activity  Outcome Measure Difficulty turning over in bed (including adjusting bedclothes, sheets and blankets)?: None Difficulty moving from lying on back to sitting on the side of the bed? : None Difficulty sitting down on and standing up from a chair with arms (e.g., wheelchair, bedside commode, etc,.)?: A Little Help needed moving to and from a bed to chair (including a wheelchair)?: A Little Help needed walking in hospital room?: A Little Help needed climbing 3-5 steps with a railing? : A Little 6 Click Score: 20    End of Session Equipment Utilized During Treatment: Gait belt Activity Tolerance: Patient tolerated treatment well Patient left: in chair;with call bell/phone within reach;with chair alarm set;with family/visitor present Nurse Communication: Mobility status PT Visit Diagnosis: Other symptoms and signs  involving the nervous system (R29.898)    Time: 1210-1230 PT Time Calculation (min) (ACUTE ONLY): 20 min   Charges:   PT Evaluation $PT Eval Moderate Complexity: 1 Mod     PT G Codes:        [place], PT, DPT [place] 07/04/2017, 1:25 PM

## 2017-07-04 NOTE — Progress Notes (Signed)
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 Supraganglionic infarction (M4-M6 cortex): 3 Total score (0-10 with 10 being normal): 10 IMPRESSION: 1. No acute finding by CT. Atrophy an extensive chronic small vessel ischemic changes throughout the cerebral hemispheres. 2. ASPECTS is 10. Page placed by telephone at the time of interpretation on 07/03/2017 at 6:00 pm to Dr. Rory Percy . Electronically Signed   By: Nelson Chimes M.D.   On: 07/03/2017 18:02   Scheduled Meds: . carvedilol  12.5 mg Oral BID WC  . diltiazem  240 mg Oral Daily  . gabapentin  100 mg Oral QHS  . insulin aspart  0-5 Units Subcutaneous QHS  . insulin aspart  0-9 Units Subcutaneous TID WC  . latanoprost  1 drop Both Eyes QHS  . pantoprazole  40 mg Oral Daily  . potassium chloride  10 mEq Oral Daily  . rosuvastatin  20 mg Oral Daily  . Warfarin - Pharmacist Dosing Inpatient   Does not apply q1800   Continuous Infusions: . sodium chloride 75 mL/hr at 07/03/17 2353     LOS: 1 day   Kerney Elbe, DO [facility] Pager [phone]  If 7PM-7AM, please  contact night-coverage [email or web address] Password [facility] 07/04/2017, 7:51 PM

## 2017-07-04 NOTE — Evaluation (Signed)
Occupational Therapy Evaluation Patient Details Name: Audrey Mullen MRN: [id number] DOB: 02-26-1941 Today's Date: 07/04/2017    History of Present Illness Pt is a 76 y/o female admitted secondary to L sided weakness, slurred speech and L facial droop. 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 PMH including but not limited to HTN, HLD, DM, CAD s/p CABG, CKD and a-fib.   Clinical Impression   Pt reports she was independent with ADL PTA. Currently pt overall min guard for ADL and functional mobility. Pt planning to d/c home with 24/7 supervision from family. Pt would benefit from continued skilled OT to address established goals.    Follow Up Recommendations  No OT follow up    Equipment Recommendations  None recommended by OT    Recommendations for Other Services       Precautions / Restrictions Precautions Precautions: Fall Restrictions Weight Bearing Restrictions: No      Mobility Bed Mobility Overal bed mobility: Needs Assistance Bed Mobility: Supine to Sit     Supine to sit: Supervision     General bed mobility comments: increased time, supervision for safety  Transfers Overall transfer level: Needs assistance Equipment used: None Transfers: Sit to/from Stand Sit to Stand: Min guard         General transfer comment: increased time, min guard for safety and stability with transition into standing from EOB    Balance Overall balance assessment: Needs assistance Sitting-balance support: Feet supported Sitting balance-Leahy Scale: Good Sitting balance - Comments: pt able to sit EOB and participate in MMT with supervision   Standing balance support: No upper extremity supported Standing balance-Leahy Scale: Fair                             ADL either performed or assessed with clinical judgement   ADL Overall ADL's : Needs assistance/impaired Eating/Feeding: Set up;Sitting   Grooming: Min  guard;Standing;Wash/dry hands   Upper Body Bathing: Set up;Sitting   Lower Body Bathing: Min guard;Sit to/from stand   Upper Body Dressing : Set up;Sitting   Lower Body Dressing: Min guard;Sit to/from stand   Toilet Transfer: Ambulation;Min guard           Functional mobility during ADLs: Min guard       Vision Baseline Vision/History: Wears glasses Wears Glasses: At all times Patient Visual Report: No change from baseline Vision Assessment?: No apparent visual deficits     Perception     Praxis      Pertinent Vitals/Pain Pain Assessment: No/denies pain     Hand Dominance Right   Extremity/Trunk Assessment Upper Extremity Assessment Upper Extremity Assessment: Overall WFL for tasks assessed   Lower Extremity Assessment Lower Extremity Assessment: Defer to PT evaluation   Cervical / Trunk Assessment Cervical / Trunk Assessment: Normal   Communication Communication Communication: No difficulties   Cognition Arousal/Alertness: Awake/alert Behavior During Therapy: WFL for tasks assessed/performed Overall Cognitive Status: Within Functional Limits for tasks assessed                                     General Comments       Exercises     Shoulder Instructions      Home Living Family/patient expects to be discharged to:: Private residence Living Arrangements: Spouse/significant other Available Help at Discharge: Family;Available 24 hours/day Type of Home: House Home Access:  Stairs to enter [employer] of Steps: 3 Entrance Stairs-Rails: None Home Layout: Multi-level     Bathroom Shower/Tub: Tub/shower unit;Walk-in shower   Bathroom Toilet: Standard     Home Equipment: Environmental consultant - 2 wheels;Cane - single point;Shower seat      Lives With: Spouse    Prior Functioning/Environment Level of Independence: Independent                 OT Problem List: Impaired balance (sitting and/or standing);Decreased knowledge of  use of DME or AE      OT Treatment/Interventions: Self-care/ADL training;DME and/or AE instruction;Energy conservation;Patient/family education;Balance training    OT Goals(Current goals can be found in the care plan section) Acute Rehab OT Goals Patient Stated Goal: return home and being active OT Goal Formulation: With patient Time For Goal Achievement: 07/18/17 Potential to Achieve Goals: Good ADL Goals Pt Will Transfer to Toilet: with modified independence;ambulating;regular height toilet Pt Will Perform Toileting - Clothing Manipulation and hygiene: with modified independence;sit to/from stand Pt Will Perform Tub/Shower Transfer: Shower transfer;with modified independence;shower seat Additional ADL Goal #1: Pt will gather ADL items and perform UB/LB bathing/dressing with mod I.  OT Frequency: Min 2X/week   Barriers to D/C:            Co-evaluation PT/OT/SLP Co-Evaluation/Treatment: Yes Reason for Co-Treatment: For patient/therapist safety;To address functional/ADL transfers PT goals addressed during session: Mobility/safety with mobility;Balance;Strengthening/ROM OT goals addressed during session: ADL's and self-care      AM-PAC PT "6 Clicks" Daily Activity     Outcome Measure Help from another person eating meals?: None Help from another person taking care of personal grooming?: A Little Help from another person toileting, which includes using toliet, bedpan, or urinal?: A Little Help from another person bathing (including washing, rinsing, drying)?: A Little Help from another person to put on and taking off regular upper body clothing?: None Help from another person to put on and taking off regular lower body clothing?: A Little 6 Click Score: 20   End of Session Equipment Utilized During Treatment: Gait belt Nurse Communication: Mobility status  Activity Tolerance: Patient tolerated treatment well Patient left: in chair;with call bell/phone within reach;with chair  alarm set;with family/visitor present  OT Visit Diagnosis: Unsteadiness on feet (R26.81)                Time: 1211-1228 OT Time Calculation (min): 17 min Charges:  OT General Charges $OT Visit: 1 Procedure OT Evaluation $OT Eval Moderate Complexity: 1 Procedure G-Codes:     Eriverto Byrnes A. Ulice Brilliant, M.S., OTR/L Pager: [place] 07/04/2017, 1:45 PM

## 2017-07-05 ENCOUNTER — Encounter (HOSPITAL_COMMUNITY): Payer: Medicare Other

## 2017-07-05 ENCOUNTER — Inpatient Hospital Stay (HOSPITAL_COMMUNITY): Payer: Medicare Other

## 2017-07-05 DIAGNOSIS — I6340 Cerebral infarction due to embolism of unspecified cerebral artery: Secondary | ICD-10-CM

## 2017-07-05 DIAGNOSIS — E1122 Type 2 diabetes mellitus with diabetic chronic kidney disease: Secondary | ICD-10-CM

## 2017-07-05 DIAGNOSIS — I639 Cerebral infarction, unspecified: Secondary | ICD-10-CM

## 2017-07-05 LAB — GLUCOSE, CAPILLARY
GLUCOSE-CAPILLARY: 161 mg/dL — AB (ref 65–99)
Glucose-Capillary: 213 mg/dL — ABNORMAL HIGH (ref 65–99)

## 2017-07-05 LAB — COMPREHENSIVE METABOLIC PANEL WITH GFR
ALT: 68 U/L — ABNORMAL HIGH (ref 14–54)
AST: 26 U/L (ref 15–41)
Albumin: 3.2 g/dL — ABNORMAL LOW (ref 3.5–5.0)
Alkaline Phosphatase: 67 U/L (ref 38–126)
Anion gap: 9 (ref 5–15)
BUN: 24 mg/dL — ABNORMAL HIGH (ref 6–20)
CO2: 25 mmol/L (ref 22–32)
Calcium: 8.6 mg/dL — ABNORMAL LOW (ref 8.9–10.3)
Chloride: 102 mmol/L (ref 101–111)
Creatinine, Ser: 1.54 mg/dL — ABNORMAL HIGH (ref 0.44–1.00)
GFR calc Af Amer: 36 mL/min — ABNORMAL LOW
GFR calc non Af Amer: 31 mL/min — ABNORMAL LOW
Glucose, Bld: 289 mg/dL — ABNORMAL HIGH (ref 65–99)
Potassium: 4.2 mmol/L (ref 3.5–5.1)
Sodium: 136 mmol/L (ref 135–145)
Total Bilirubin: 0.7 mg/dL (ref 0.3–1.2)
Total Protein: 6 g/dL — ABNORMAL LOW (ref 6.5–8.1)

## 2017-07-05 LAB — PROTIME-INR
INR: 1.97
Prothrombin Time: 22.7 s — ABNORMAL HIGH (ref 11.4–15.2)

## 2017-07-05 LAB — CBC WITH DIFFERENTIAL/PLATELET
Basophils Absolute: 0 K/uL (ref 0.0–0.1)
Basophils Relative: 0 %
Eosinophils Absolute: 0.2 K/uL (ref 0.0–0.7)
Eosinophils Relative: 3 %
HCT: 36.1 % (ref 36.0–46.0)
Hemoglobin: 11.4 g/dL — ABNORMAL LOW (ref 12.0–15.0)
Lymphocytes Relative: 19 %
Lymphs Abs: 1.5 K/uL (ref 0.7–4.0)
MCH: 26.1 pg (ref 26.0–34.0)
MCHC: 31.6 g/dL (ref 30.0–36.0)
MCV: 82.8 fL (ref 78.0–100.0)
Monocytes Absolute: 0.7 K/uL (ref 0.1–1.0)
Monocytes Relative: 9 %
Neutro Abs: 5.4 K/uL (ref 1.7–7.7)
Neutrophils Relative %: 69 %
Platelets: 202 K/uL (ref 150–400)
RBC: 4.36 MIL/uL (ref 3.87–5.11)
RDW: 16.1 % — ABNORMAL HIGH (ref 11.5–15.5)
WBC: 7.8 K/uL (ref 4.0–10.5)

## 2017-07-05 LAB — MAGNESIUM: Magnesium: 2 mg/dL (ref 1.7–2.4)

## 2017-07-05 LAB — PHOSPHORUS: PHOSPHORUS: 3 mg/dL (ref 2.5–4.6)

## 2017-07-05 MED ORDER — SENNOSIDES-DOCUSATE SODIUM 8.6-50 MG PO TABS: 1.0000 | ORAL_TABLET | Freq: Every evening | ORAL | 0 refills | Status: AC | PRN

## 2017-07-05 MED ORDER — APIXABAN 5 MG PO TABS: 5.0000 mg | ORAL_TABLET | Freq: Two times a day (BID) | ORAL | 0 refills | Status: DC

## 2017-07-05 MED ORDER — APIXABAN 5 MG PO TABS
5.0000 mg | ORAL_TABLET | Freq: Two times a day (BID) | ORAL | Status: DC
  Administered 2017-07-05: 5 mg via ORAL
  Filled 2017-07-05: qty 1

## 2017-07-05 MED ORDER — ASPIRIN EC 81 MG PO TBEC
81.0000 mg | DELAYED_RELEASE_TABLET | Freq: Every day | ORAL | Status: DC
  Administered 2017-07-05: 81 mg via ORAL

## 2017-07-05 MED ORDER — WARFARIN SODIUM 7.5 MG PO TABS: 7.5000 mg | ORAL_TABLET | Freq: Once | ORAL | Status: DC

## 2017-07-05 NOTE — Progress Notes (Signed)
ANTICOAGULATION CONSULT NOTE - follow up Pharmacy Consult for Warfarin>apixaban Indication: atrial fibrillation  No Known Allergies  Patient Measurements: Height: 5' (152.4 cm) Weight: 154 lb 8.7 oz (70.1 kg) IBW/kg (Calculated) : 45.5  Vital Signs: Temp: 98.1 F (36.7 C) (08/18 0923) Temp Source: Oral (08/18 0923) BP: 133/77 (08/18 0923) Pulse Rate: 84 (08/18 0923)  Labs:  Recent Labs  07/03/17 1743 07/03/17 1754 07/04/17 0511 07/04/17 0857 07/05/17 0440  HGB 12.0 12.2  --  12.4 11.4*  HCT 37.5 36.0  --  38.5 36.1  PLT 221  --   --  222 202  APTT 36  --   --   --   --   LABPROT 36.0*  --  >90.0* 26.5* 22.7*  INR 3.51  --  >10.00* 2.39 1.97  CREATININE 1.44* 1.10*  --  1.20* 1.54*    Estimated Creatinine Clearance: 26.3 mL/min (A) (by C-G formula based on SCr of 1.54 mg/dL (H)).   Medical History: Past Medical History:  Diagnosis Date  . Anginal pain ([place])   . Arthritis   . Coronary artery disease   . Diabetes mellitus without complication ([place])   . Dysrhythmia    atrail fibrillation  . GERD (gastroesophageal reflux disease)   . Glaucoma    bilat   . Hyperlipidemia   . Hypertension   . Kidney stone   . Urinary tract infection   . Wears glasses     Assessment: 76 YOF who presented on 8/16 as a code stroke with sudden onset left face and arm weakness. The patient's CT was negative on admission for bleed. The patient was on warfarin PTA for hx Afib.  Admit INR was elevated at 3.51. Pharmacy originally consulted for warfarin, but now consulted to switch to apixaban.  Appropriate to give 5mg  dose: Pt <76 yo, >60kg, Scr >1.5  Goal of Therapy:  Monitor platelets by anticoagulation protocol: Yes   Plan:  D/c warfarin Apixaban 5mg  PO BID F/u CBC, Scr, LFTs, S/sx bleeding  Thank you for allowing pharmacy to be a part of this patient's care.  Nida Boatman, PharmD PGY1 Acute Care Pharmacy Resident Pager: [phone] 07/05/2017 12:43 PM    I  discussed / reviewed the pharmacy note by Dr. Haydee Salter and I agree with his findings and plans as documented.  Carlean Jews, Pharm.D.

## 2017-07-05 NOTE — Discharge Summary (Signed)
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   Vancomycin resistance NOT DETECTED NOT DETECTED Final   Listeria monocytogenes NOT DETECTED NOT DETECTED Final   Staphylococcus species DETECTED (A) NOT DETECTED Final    Comment: CRITICAL RESULT CALLED TO, READ BACK BY AND VERIFIED WITH: L CURRAN PHARMD 1738 07/04/17 A BROWNING    Staphylococcus aureus NOT DETECTED NOT DETECTED Final   Methicillin resistance DETECTED (A) NOT DETECTED Final    Comment: CRITICAL RESULT CALLED TO, READ BACK BY AND VERIFIED WITH: L CURRAN PHARMD 1738 07/04/17 A BROWNING    Streptococcus species NOT DETECTED NOT DETECTED Final   Streptococcus agalactiae NOT DETECTED NOT DETECTED Final   Streptococcus pneumoniae NOT DETECTED NOT DETECTED Final   Streptococcus pyogenes NOT DETECTED NOT DETECTED Final   Acinetobacter baumannii NOT DETECTED NOT DETECTED Final   Enterobacteriaceae species NOT DETECTED NOT DETECTED Final   Enterobacter cloacae complex NOT DETECTED NOT DETECTED Final   Escherichia coli NOT DETECTED NOT DETECTED Final    Klebsiella oxytoca NOT DETECTED NOT DETECTED Final   Klebsiella pneumoniae NOT DETECTED NOT DETECTED Final   Proteus species NOT DETECTED NOT DETECTED Final   Serratia marcescens NOT DETECTED NOT DETECTED Final   Carbapenem resistance NOT DETECTED NOT DETECTED Final   Haemophilus influenzae NOT DETECTED NOT DETECTED Final   Neisseria meningitidis NOT DETECTED NOT DETECTED Final   Pseudomonas aeruginosa NOT DETECTED NOT DETECTED Final   Candida albicans NOT DETECTED NOT DETECTED Final   Candida glabrata NOT DETECTED NOT DETECTED Final   Candida krusei NOT DETECTED NOT DETECTED Final   Candida parapsilosis NOT DETECTED NOT DETECTED Final   Candida tropicalis NOT DETECTED NOT DETECTED Final    Labs: BNP (last 3 results)  Recent Labs  11/07/16 1527  BNP 01.7   Basic Metabolic Panel:  Recent Labs Lab 07/03/17 1743 07/03/17 1754 07/04/17 0857 07/05/17 0440  NA 136 139 137 136  K 4.2 3.7 3.5 4.2  CL  104 102 104 102  CO2 24  --  23 25  GLUCOSE 285* 283* 282* 289*  BUN 24* 29* 15 24*  CREATININE 1.44* 1.10* 1.20* 1.54*  CALCIUM 8.6*  --  8.7* 8.6*  MG  --   --  1.8 2.0  PHOS  --   --  2.9 3.0   Liver Function Tests:  Recent Labs Lab 07/03/17 1743 07/04/17 0857 07/05/17 0440  AST 61* 41 26  ALT 99* 93* 68*  ALKPHOS 78 76 67  BILITOT 1.5* 0.8 0.7  PROT 6.1* 6.3* 6.0*  ALBUMIN 3.6 3.6 3.2*   No results for input(s): LIPASE, AMYLASE in the last 168 hours. No results for input(s): AMMONIA in the last 168 hours. CBC:  Recent Labs Lab 07/03/17 1743 07/03/17 1754 07/04/17 0857 07/05/17 0440  WBC 7.8  --  9.2 7.8  NEUTROABS 5.4  --  6.8 5.4  HGB 12.0 12.2 12.4 11.4*  HCT 37.5 36.0 38.5 36.1  MCV 84.1  --  82.3 82.8  PLT 221  --  222 202   Cardiac Enzymes: No results for input(s): CKTOTAL, CKMB, CKMBINDEX, TROPONINI in the last 168 hours. BNP: Invalid input(s): POCBNP CBG:  Recent Labs Lab 07/04/17 1210 07/04/17 1640 07/04/17 2149 07/05/17 0623  07/05/17 1130  GLUCAP 189* 184* 168* 213* 161*   D-Dimer No results for input(s): DDIMER in the last 72 hours. Hgb A1c  Recent Labs  07/04/17 0511  HGBA1C 8.8*   Lipid Profile  Recent Labs  07/04/17 0511  CHOL 96  HDL 33*  LDLCALC 45  TRIG 91  CHOLHDL 2.9   Thyroid function studies No results for input(s): TSH, T4TOTAL, T3FREE, THYROIDAB in the last 72 hours.  Invalid input(s): FREET3 Anemia work up No results for input(s): VITAMINB12, FOLATE, FERRITIN, TIBC, IRON, RETICCTPCT in the last 72 hours. Urinalysis    Component Value Date/Time   COLORURINE YELLOW 07/03/2017 1813   APPEARANCEUR HAZY (A) 07/03/2017 1813   LABSPEC 1.015 07/03/2017 1813   PHURINE 6.0 07/03/2017 1813   GLUCOSEU >=500 (A) 07/03/2017 1813   HGBUR LARGE (A) 07/03/2017 1813   BILIRUBINUR NEGATIVE 07/03/2017 1813   KETONESUR NEGATIVE 07/03/2017 1813   PROTEINUR 30 (A) 07/03/2017 1813   UROBILINOGEN 0.2 08/18/2007 1126   NITRITE NEGATIVE 07/03/2017 1813   LEUKOCYTESUR LARGE (A) 07/03/2017 1813   Sepsis Labs Invalid input(s): PROCALCITONIN,  WBC,  LACTICIDVEN Microbiology Recent Results (from the past 240 hour(s))  Urine Culture     Status: Abnormal (Preliminary result)   Collection Time: 07/03/17  6:13 PM  Result Value Ref Range Status   Specimen Description URINE, RANDOM  Final   Special Requests NONE  Final   Culture (A)  Final    >=100,000 COLONIES/mL ACINETOBACTER CALCOACETICUS/BAUMANNII COMPLEX SUSCEPTIBILITIES TO FOLLOW    Report Status PENDING  Incomplete  Culture, blood (Routine X 2) w Reflex to ID Panel     Status: None (Preliminary result)   Collection Time: 07/03/17  9:05 PM  Result Value Ref Range Status   Specimen Description BLOOD RIGHT ARM  Final   Special Requests   Final    BOTTLES DRAWN AEROBIC AND ANAEROBIC Blood Culture adequate volume   Culture NO GROWTH 2 DAYS  Final   Report Status PENDING  Incomplete  Culture, blood (Routine X 2) w Reflex to ID Panel      Status: Abnormal (Preliminary result)   Collection Time: 07/03/17  9:18 PM  Result Value Ref Range Status   Specimen Description BLOOD  RIGHT HAND  Final   Special Requests   Final    BOTTLES DRAWN AEROBIC AND ANAEROBIC Blood Culture adequate volume   Culture  Setup Time   Final    GRAM POSITIVE COCCI IN CLUSTERS ANAEROBIC BOTTLE ONLY CRITICAL RESULT CALLED TO, READ BACK BY AND VERIFIED WITH: L CURRAN PHARMD 1738 07/04/17 A BROWNING    Culture (A)  Final    STAPHYLOCOCCUS SPECIES (COAGULASE NEGATIVE) THE SIGNIFICANCE OF ISOLATING THIS ORGANISM FROM A SINGLE SET OF BLOOD CULTURES WHEN MULTIPLE SETS ARE DRAWN IS UNCERTAIN. PLEASE NOTIFY THE MICROBIOLOGY DEPARTMENT WITHIN ONE WEEK IF SPECIATION AND SENSITIVITIES ARE REQUIRED.    Report Status PENDING  Incomplete  Blood Culture ID Panel (Reflexed)     Status: Abnormal   Collection Time: 07/03/17  9:18 PM  Result Value Ref Range Status   Enterococcus species NOT DETECTED NOT DETECTED Final   Vancomycin resistance NOT DETECTED NOT DETECTED Final   Listeria monocytogenes NOT DETECTED NOT DETECTED Final   Staphylococcus species DETECTED (A) NOT DETECTED Final    Comment: CRITICAL RESULT CALLED TO, READ BACK BY AND VERIFIED WITH: L CURRAN PHARMD 1738 07/04/17 A BROWNING    Staphylococcus aureus NOT DETECTED NOT DETECTED Final   Methicillin resistance DETECTED (A) NOT DETECTED Final    Comment: CRITICAL RESULT CALLED TO, READ BACK BY AND VERIFIED WITH: Bronwen Betters PHARMD 1738 07/04/17 A BROWNING    Streptococcus species NOT DETECTED NOT DETECTED Final   Streptococcus agalactiae NOT DETECTED NOT DETECTED Final   Streptococcus pneumoniae NOT DETECTED NOT DETECTED Final   Streptococcus pyogenes NOT DETECTED NOT DETECTED Final   Acinetobacter baumannii NOT DETECTED NOT DETECTED Final   Enterobacteriaceae species NOT DETECTED NOT DETECTED Final   Enterobacter cloacae complex NOT DETECTED NOT DETECTED Final   Escherichia coli NOT DETECTED NOT DETECTED  Final   Klebsiella oxytoca NOT DETECTED NOT DETECTED Final   Klebsiella pneumoniae NOT DETECTED NOT DETECTED Final   Proteus species NOT DETECTED NOT DETECTED Final   Serratia marcescens NOT DETECTED NOT DETECTED Final   Carbapenem resistance NOT DETECTED NOT DETECTED Final   Haemophilus influenzae NOT DETECTED NOT DETECTED Final   Neisseria meningitidis NOT DETECTED NOT DETECTED Final   Pseudomonas aeruginosa NOT DETECTED NOT DETECTED Final   Candida albicans NOT DETECTED NOT DETECTED Final   Candida glabrata NOT DETECTED NOT DETECTED Final   Candida krusei NOT DETECTED NOT DETECTED Final   Candida parapsilosis NOT DETECTED NOT DETECTED Final   Candida tropicalis NOT DETECTED NOT DETECTED Final   Time coordinating discharge: 35 minutes  SIGNED:  Kerney Elbe, DO [facility] 07/05/2017, 5:06 PM Pager [phone]  If 7PM-7AM, please contact night-coverage [email or web address] Password [email or web address]

## 2017-07-05 NOTE — Progress Notes (Signed)
ANTICOAGULATION CONSULT NOTE - follow up Pharmacy Consult for Warfarin Indication: atrial fibrillation  No Known Allergies  Patient Measurements: Height: 5' (152.4 cm) Weight: 154 lb 8.7 oz (70.1 kg) IBW/kg (Calculated) : 45.5  Vital Signs: Temp: 98.1 F (36.7 C) (08/18 0923) Temp Source: Oral (08/18 0923) BP: 133/77 (08/18 0923) Pulse Rate: 84 (08/18 0923)  Labs:  Recent Labs  07/03/17 1743 07/03/17 1754 07/04/17 0511 07/04/17 0857 07/05/17 0440  HGB 12.0 12.2  --  12.4 11.4*  HCT 37.5 36.0  --  38.5 36.1  PLT 221  --   --  222 202  APTT 36  --   --   --   --   LABPROT 36.0*  --  >90.0* 26.5* 22.7*  INR 3.51  --  >10.00* 2.39 1.97  CREATININE 1.44* 1.10*  --  1.20* 1.54*    Estimated Creatinine Clearance: 26.3 mL/min (A) (by C-G formula based on SCr of 1.54 mg/dL (H)).   Medical History: Past Medical History:  Diagnosis Date  . Anginal pain ([place])   . Arthritis   . Coronary artery disease   . Diabetes mellitus without complication ([place])   . Dysrhythmia    atrail fibrillation  . GERD (gastroesophageal reflux disease)   . Glaucoma    bilat   . Hyperlipidemia   . Hypertension   . Kidney stone   . Urinary tract infection   . Wears glasses     Assessment: 12 YOF who presented on 8/16 as a code stroke with sudden onset left face and arm weakness. The patient's CT was negative on admission for bleed. Admit INR was elevated at 3.51. The patient was on warfarin PTA for hx Afib and pharmacy consulted to resume dosing this admission. Per Neurology, may switch to apixaban prior to discharge, but continuing warfarin for now.   INR >10 yesterday - repeat INR was 2.39 - Vit K cancelled. Today's INR subtherapeutic at 1.97. Hgb 12.4>11.4, no bleeding noted, confirmed with RN. No new DDIs, but on PTA prednisone. PO intake 100% yesterday.   PTA warf dose: per clinic notes were 7.5 mg daily EXCEPT for 3.75 mg on MWF.   History of variable INRs prior to admission with  admit INR supratherapeutic.   Goal of Therapy:  INR 2-3 Monitor platelets by anticoagulation protocol: Yes   Plan:  Coumadin 7.5mg  po tonight x1 Daily INR F/u CBC q3 days, s/sx bleeding, PO intake, DDI   Thank you for allowing pharmacy to be a part of this patient's care.  Nida Boatman, PharmD PGY1 Acute Care Pharmacy Resident Pager: [phone] 07/05/2017 10:04 AM

## 2017-07-05 NOTE — Progress Notes (Signed)
Physical Therapy Treatment Patient Details Name: Audrey Mullen MRN: [id number] DOB: Aug 30, 1941 Today's Date: 07/05/2017    History of Present Illness Pt is a 76 y/o female admitted secondary to L sided weakness, slurred speech and L facial droop. 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 PMH including but not limited to HTN, HLD, DM, CAD s/p CABG, CKD and a-fib.    PT Comments    Patient progressing well towards PT goals. Scored 19/24 on DGI. Tolerated gait training and stair training with supervision for safety. Balance seems improved from prior session. Pt seems to be functioning close to baseline. Education re: sign/symptoms of stroke. Encouraged ambulation with RN. Will follow to maximize independence and mobility.     Follow Up Recommendations  No PT follow up;Supervision - Intermittent     Equipment Recommendations  None recommended by PT    Recommendations for Other Services       Precautions / Restrictions Precautions Precautions: None Restrictions Weight Bearing Restrictions: No    Mobility  Bed Mobility Overal bed mobility: Needs Assistance Bed Mobility: Supine to Sit;Sit to Supine     Supine to sit: Modified independent (Device/Increase time);HOB elevated Sit to supine: Modified independent (Device/Increase time);HOB elevated   General bed mobility comments: increased time, no assist needed.   Transfers Overall transfer level: Needs assistance Equipment used: None Transfers: Sit to/from Stand Sit to Stand: Supervision         General transfer comment: Supervision for safety. Stood from [employer].   Ambulation/Gait Ambulation/Gait assistance: Min guard Ambulation Distance (Feet): 200 Feet Assistive device: None Gait Pattern/deviations: Step-through pattern;Decreased stride length;Shuffle;Trunk flexed;Narrow base of support Gait velocity: decreased   General Gait Details: Slow, shuffling like gait with forward flexed  trunk; Min guard for safety but no overt LOB. See balance section for details.   Stairs Stairs: Yes   Stair Management: Two rails;Forwards;Alternating pattern Number of Stairs: 2 (+ 3 steps x2 bouts) General stair comments: Supervision for safety.   Wheelchair Mobility    Modified Rankin (Stroke Patients Only) Modified Rankin (Stroke Patients Only) Pre-Morbid Rankin Score: No symptoms Modified Rankin: No significant disability     Balance Overall balance assessment: Needs assistance Sitting-balance support: Feet supported;No upper extremity supported Sitting balance-Leahy Scale: Good     Standing balance support: During functional activity Standing balance-Leahy Scale: Fair                   Standardized Balance Assessment Standardized Balance Assessment : Dynamic Gait Index   Dynamic Gait Index Level Surface: Mild Impairment Change in Gait Speed: Mild Impairment Gait with Horizontal Head Turns: Normal Gait with Vertical Head Turns: Normal Gait and Pivot Turn: Mild Impairment Step Over Obstacle: Mild Impairment Step Around Obstacles: Normal Steps: Mild Impairment Total Score: 19      Cognition Arousal/Alertness: Awake/alert Behavior During Therapy: WFL for tasks assessed/performed Overall Cognitive Status: Within Functional Limits for tasks assessed                                        Exercises      General Comments        Pertinent Vitals/Pain Pain Assessment: No/denies pain    Home Living                      Prior Function  PT Goals (current goals can now be found in the care plan section) Progress towards PT goals: Progressing toward goals    Frequency    Min 4X/week      PT Plan Current plan remains appropriate    Co-evaluation              AM-PAC PT "6 Clicks" Daily Activity  Outcome Measure  Difficulty turning over in bed (including adjusting bedclothes, sheets and  blankets)?: None Difficulty moving from lying on back to sitting on the side of the bed? : None Difficulty sitting down on and standing up from a chair with arms (e.g., wheelchair, bedside commode, etc,.)?: None Help needed moving to and from a bed to chair (including a wheelchair)?: None Help needed walking in hospital room?: A Little Help needed climbing 3-5 steps with a railing? : A Little 6 Click Score: 22    End of Session Equipment Utilized During Treatment: Gait belt Activity Tolerance: Patient tolerated treatment well Patient left: in bed;with call bell/phone within reach;with bed alarm set Nurse Communication: Mobility status PT Visit Diagnosis: Other symptoms and signs involving the nervous system (R29.898)     Time: 0156-1537 PT Time Calculation (min) (ACUTE ONLY): 17 min  Charges:  $Neuromuscular Re-education: 8-22 mins                    G Codes:       Audrey Mullen, PT, DPT [phone]     Audrey Mullen A Audrey Mullen 07/05/2017, 8:37 AM

## 2017-07-05 NOTE — Progress Notes (Signed)
Occupational Therapy Treatment Patient Details Name: Audrey Mullen MRN: [id number] DOB: [birth year] Today's Date: 07/05/2017    History of present illness Pt is a 76 y/o female admitted secondary to L sided weakness, slurred speech and L facial droop. 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 PMH including but not limited to HTN, HLD, DM, CAD s/p CABG, CKD and a-fib.   OT comments  Pt able to perform toilet and shower transfer with supervision today, mild balance deficits noted but no LOB with activity. Able to complete UB/LB dressing and grooming standing at the sink with supervision as well. Pt feels that she has returned to her functional baseline. D/c plan remains appropriate. Will continue to follow acutely.   Follow Up Recommendations  No OT follow up    Equipment Recommendations  None recommended by OT    Recommendations for Other Services      Precautions / Restrictions Precautions Precautions: None Restrictions Weight Bearing Restrictions: No       Mobility Bed Mobility Overal bed mobility: Modified Independent Bed Mobility: Supine to Sit;Sit to Supine     Supine to sit: Modified independent (Device/Increase time);HOB elevated Sit to supine: Modified independent (Device/Increase time);HOB elevated   General bed mobility comments: increased time, no assist needed.   Transfers Overall transfer level: Needs assistance Equipment used: None Transfers: Sit to/from Stand Sit to Stand: Supervision         General transfer comment: for safety    Balance Overall balance assessment: Needs assistance Sitting-balance support: Feet supported;No upper extremity supported Sitting balance-Leahy Scale: Good     Standing balance support: No upper extremity supported;During functional activity Standing balance-Leahy Scale: Good                   Standardized Balance Assessment Standardized Balance Assessment : Dynamic Gait  Index   Dynamic Gait Index Level Surface: Mild Impairment Change in Gait Speed: Mild Impairment Gait with Horizontal Head Turns: Normal Gait with Vertical Head Turns: Normal Gait and Pivot Turn: Mild Impairment Step Over Obstacle: Mild Impairment Step Around Obstacles: Normal Steps: Mild Impairment Total Score: 19     ADL either performed or assessed with clinical judgement   ADL Overall ADL's : Needs assistance/impaired     Grooming: Supervision/safety;Oral care;Wash/dry hands;Standing           Upper Body Dressing : Set up;Sitting   Lower Body Dressing: Supervision/safety;Sit to/from stand   Toilet Transfer: Supervision/safety;Ambulation;Regular Toilet   Toileting- Clothing Manipulation and Hygiene: Modified independent;Sit to/from stand   Tub/ Shower Transfer: Supervision/safety;Walk-in shower;Ambulation;Grab bars   Functional mobility during ADLs: Supervision/safety General ADL Comments: Pt reports she feels she has returned to her funcitonal baseline     Vision       Perception     Praxis      Cognition Arousal/Alertness: Awake/alert Behavior During Therapy: WFL for tasks assessed/performed Overall Cognitive Status: Within Functional Limits for tasks assessed                                          Exercises     Shoulder Instructions       General Comments      Pertinent Vitals/ Pain       Pain Assessment: No/denies pain  Home Living  Prior Functioning/Environment              Frequency  Min 2X/week        Progress Toward Goals  OT Goals(current goals can now be found in the care plan section)  Progress towards OT goals: Progressing toward goals  Acute Rehab OT Goals Patient Stated Goal: return home and being active OT Goal Formulation: With patient  Plan Discharge plan remains appropriate    Co-evaluation                 AM-PAC PT "6  Clicks" Daily Activity     Outcome Measure   Help from another person eating meals?: None Help from another person taking care of personal grooming?: A Little Help from another person toileting, which includes using toliet, bedpan, or urinal?: A Little Help from another person bathing (including washing, rinsing, drying)?: A Little Help from another person to put on and taking off regular upper body clothing?: None Help from another person to put on and taking off regular lower body clothing?: A Little 6 Click Score: 20    End of Session    OT Visit Diagnosis: Unsteadiness on feet (R26.81)   Activity Tolerance Patient tolerated treatment well   Patient Left in bed;with call bell/phone within reach   Nurse Communication          Time: 5883-2549 OT Time Calculation (min): 12 min  Charges: OT General Charges $OT Visit: 1 Procedure OT Treatments $Self Care/Home Management : 8-22 mins  Sache Sane A. Ulice Brilliant, M.S., OTR/L Pager: [place] 07/05/2017, 11:43 AM

## 2017-07-05 NOTE — Progress Notes (Signed)
VASCULAR LAB    Patient had CTA of neck. Please advise if carotid duplex still needed.  Thank you,  Traves Majchrzak, RVT 07/05/2017, 7:13 AM

## 2017-07-05 NOTE — Progress Notes (Signed)
Patient provided with 30 day free eliquis card

## 2017-07-05 NOTE — Progress Notes (Signed)
*  PRELIMINARY RESULTS* Vascular Ultrasound Carotid Duplex (Doppler) has been completed.  Preliminary findings: Findings consistent with a 1-39 percent stenosis involving the right internal carotid artery and the left internal carotid artery. Bilateral vertebral arteries appear patent and antegrade.  Difficult visualization od plaque due to acoustic shadowing.  Everrett Coombe 07/05/2017, 4:17 PM
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
## 2017-07-06 LAB — VAS US CAROTID
LCCAPDIAS: 14 cm/s
LEFT ECA DIAS: -6 cm/s
LEFT VERTEBRAL DIAS: 25 cm/s
LICADDIAS: -26 cm/s
LICAPDIAS: 30 cm/s
LICAPSYS: 90 cm/s
Left CCA dist dias: -14 cm/s
Left CCA dist sys: -68 cm/s
Left CCA prox sys: 135 cm/s
Left ICA dist sys: -78 cm/s
RCCAPDIAS: 21 cm/s
RCCAPSYS: 88 cm/s
RIGHT ECA DIAS: -15 cm/s
RIGHT VERTEBRAL DIAS: -13 cm/s
Right cca dist sys: -61 cm/s

## 2017-07-06 LAB — URINE CULTURE

## 2017-07-06 LAB — CULTURE, BLOOD (ROUTINE X 2): SPECIAL REQUESTS: ADEQUATE

## 2017-07-07 ENCOUNTER — Telehealth: Payer: Self-pay | Admitting: *Deleted
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
## 2017-07-08 LAB — CULTURE, BLOOD (ROUTINE X 2)
CULTURE: NO GROWTH
Special Requests: ADEQUATE

## 2017-07-09 DIAGNOSIS — Z0000 Encounter for general adult medical examination without abnormal findings: Secondary | ICD-10-CM | POA: Diagnosis not present

## 2017-07-09 DIAGNOSIS — I6340 Cerebral infarction due to embolism of unspecified cerebral artery: Secondary | ICD-10-CM | POA: Diagnosis not present

## 2017-07-09 DIAGNOSIS — E119 Type 2 diabetes mellitus without complications: Secondary | ICD-10-CM | POA: Diagnosis not present

## 2017-07-09 DIAGNOSIS — N390 Urinary tract infection, site not specified: Secondary | ICD-10-CM | POA: Diagnosis not present

## 2017-07-09 DIAGNOSIS — I10 Essential (primary) hypertension: Secondary | ICD-10-CM | POA: Diagnosis not present
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
## 2017-07-11 ENCOUNTER — Encounter: Payer: Self-pay | Admitting: Cardiology

## 2017-07-11 ENCOUNTER — Ambulatory Visit (INDEPENDENT_AMBULATORY_CARE_PROVIDER_SITE_OTHER): Payer: Medicare Other | Admitting: Cardiology

## 2017-07-11 VITALS — BP 124/64 | HR 86 | Ht 64.0 in | Wt 149.0 lb

## 2017-07-11 DIAGNOSIS — I10 Essential (primary) hypertension: Secondary | ICD-10-CM | POA: Diagnosis not present

## 2017-07-11 DIAGNOSIS — E785 Hyperlipidemia, unspecified: Secondary | ICD-10-CM

## 2017-07-11 DIAGNOSIS — I480 Paroxysmal atrial fibrillation: Secondary | ICD-10-CM | POA: Diagnosis not present

## 2017-07-11 DIAGNOSIS — I2510 Atherosclerotic heart disease of native coronary artery without angina pectoris: Secondary | ICD-10-CM | POA: Diagnosis not present

## 2017-07-11 DIAGNOSIS — I63321 Cerebral infarction due to thrombosis of right anterior cerebral artery: Secondary | ICD-10-CM | POA: Diagnosis not present

## 2017-07-11 DIAGNOSIS — Z951 Presence of aortocoronary bypass graft: Secondary | ICD-10-CM | POA: Diagnosis not present

## 2017-07-11 NOTE — Patient Instructions (Signed)
Your physician recommends that you continue on your current medications as directed. Please refer to the Current Medication list given to you today.  Your physician recommends that you schedule a follow-up appointment in:  AS PLANNED  WITH DR Johnsie Cancel

## 2017-07-22 ENCOUNTER — Other Ambulatory Visit: Payer: Self-pay

## 2017-07-22 NOTE — Patient Outreach (Signed)
[place] [facility]) Care Management  07/22/2017  [place] 08-Dec-1940 [id number]  EMMI: stroke Referral date: 07/22/17 Referral source: EMMI stroke RED alert Referral reason: Feeling worse oveall: YES,  New worsening symptoms: YES, Went to follow up appointment: NO Day # 13  Received return call from patient. HIPAA verified.  Discussed EMMI stroke program with patient. Patient states she has a slight headache after taking her medications. Patient states she did not call her doctor and report symptoms. Patient states she is scheduled to see her primary MD on tomorrow, 9/ 5/18.  Patient denies any additional new onset of symptoms. Patient states she has all of her medications and has transportation to her appointments. Patient states she has scheduled appointments with her cardiologist and neurologist, Dr Erlinda Hong.  Patient states she saw her primary MD approximately 2 weeks ago. Patient states she had her lab work done at her last primary MD appointment visit and was put on antibiotics due to a urinary tract infection. Patient states she has completed her antibiotic treatment.  RNCM discussed signs/ symptoms of stroke and advised patient to call 911 for stroke like symptoms. RNCM advised patient to call doctor for non emergent symptoms. Patient states she is aware of how to contact her doctor after hours if needed.  Patient denies having further concerns.  ASSESSMENT:  Per patients MEDICAL RECORD NUMBERAdmit date: 07/03/2017 Discharge date: 07/05/2017 76 y.o.femalewith medical history significant of hypertension, hyperlipidemia, diabetes mellitus, GERD, CAD, s/p of CABG, atrial fibrillation on Coumadin, Discharge Diagnoses:  Principal Problem:   Stroke (cerebrum) ([place]) Active Problems:   Atrial fibrillation ([place])   Coronary artery disease due to lipid rich plaque   S/P CABG x 4   HTN (hypertension)   Hyperlipemia   GERD (gastroesophageal reflux disease)   CAD (coronary artery  disease)   CKD (chronic kidney disease), stage IIICKD-3,     PLAN; RNCM will refer patient to care management assistant to close due to patient being assessed and having no further needs.  RNCM will notify patients primary MD of closure.   Quinn Plowman RN,BSN,CCM [facility] Telephonic  [phone]

## 2017-07-22 NOTE — Patient Outreach (Signed)
[facility]) Care Management  07/22/2017  [place] 1941/01/07 [id number]  EMMI: stroke Referral date: 07/22/17 Referral source: EMMI stroke RED alert Referral reason: Feeling worse oveall: YES,  New worsening symptoms: YES, Went to follow up appointment: NO Day # 13 Attempt #1  Telephone call to patient regarding EMMI stroke red alert. Unable to reach patient. HIPAA compliant voice message left with call back phone number. Marland Kitchen    PLAN: RMCM will attempt 2nd telephone call to patient within 3 business days.   Quinn Plowman RN,BSN,CCM [facility] Telephonic  [phone]

## 2017-07-23 ENCOUNTER — Ambulatory Visit: Payer: Self-pay

## 2017-07-23 DIAGNOSIS — E119 Type 2 diabetes mellitus without complications: Secondary | ICD-10-CM | POA: Diagnosis not present

## 2017-07-23 DIAGNOSIS — I6930 Unspecified sequelae of cerebral infarction: Secondary | ICD-10-CM | POA: Diagnosis not present

## 2017-07-23 DIAGNOSIS — I119 Hypertensive heart disease without heart failure: Secondary | ICD-10-CM | POA: Diagnosis not present

## 2017-07-23 DIAGNOSIS — I10 Essential (primary) hypertension: Secondary | ICD-10-CM | POA: Diagnosis not present

## 2017-07-23 DIAGNOSIS — I4891 Unspecified atrial fibrillation: Secondary | ICD-10-CM | POA: Diagnosis not present

## 2017-08-11 DIAGNOSIS — N183 Chronic kidney disease, stage 3 (moderate): Secondary | ICD-10-CM | POA: Diagnosis not present

## 2017-08-11 DIAGNOSIS — I2510 Atherosclerotic heart disease of native coronary artery without angina pectoris: Secondary | ICD-10-CM | POA: Diagnosis not present

## 2017-08-11 DIAGNOSIS — D688 Other specified coagulation defects: Secondary | ICD-10-CM | POA: Diagnosis not present

## 2017-08-11 DIAGNOSIS — Y93G1 Activity, food preparation and clean up: Secondary | ICD-10-CM | POA: Diagnosis not present

## 2017-08-11 DIAGNOSIS — S61215A Laceration without foreign body of left ring finger without damage to nail, initial encounter: Secondary | ICD-10-CM | POA: Insufficient documentation

## 2017-08-11 DIAGNOSIS — Z7982 Long term (current) use of aspirin: Secondary | ICD-10-CM | POA: Diagnosis not present

## 2017-08-11 DIAGNOSIS — Y999 Unspecified external cause status: Secondary | ICD-10-CM | POA: Diagnosis not present

## 2017-08-11 DIAGNOSIS — W260XXA Contact with knife, initial encounter: Secondary | ICD-10-CM | POA: Insufficient documentation

## 2017-08-11 DIAGNOSIS — Y92000 Kitchen of unspecified non-institutional (private) residence as  the place of occurrence of the external cause: Secondary | ICD-10-CM | POA: Diagnosis not present

## 2017-08-11 DIAGNOSIS — Z7901 Long term (current) use of anticoagulants: Secondary | ICD-10-CM | POA: Insufficient documentation

## 2017-08-11 DIAGNOSIS — I129 Hypertensive chronic kidney disease with stage 1 through stage 4 chronic kidney disease, or unspecified chronic kidney disease: Secondary | ICD-10-CM | POA: Diagnosis not present

## 2017-08-11 DIAGNOSIS — Z951 Presence of aortocoronary bypass graft: Secondary | ICD-10-CM | POA: Diagnosis not present

## 2017-08-11 DIAGNOSIS — E119 Type 2 diabetes mellitus without complications: Secondary | ICD-10-CM | POA: Insufficient documentation

## 2017-08-11 DIAGNOSIS — Z8673 Personal history of transient ischemic attack (TIA), and cerebral infarction without residual deficits: Secondary | ICD-10-CM | POA: Diagnosis not present

## 2017-08-12 ENCOUNTER — Emergency Department (HOSPITAL_COMMUNITY)
Admission: EM | Admit: 2017-08-12 | Discharge: 2017-08-12 | Disposition: A | Discharge status: Home / Self Care | Payer: Medicare Other | Attending: Emergency Medicine | Admitting: Emergency Medicine

## 2017-08-12 ENCOUNTER — Encounter (HOSPITAL_COMMUNITY): Payer: Self-pay | Admitting: *Deleted

## 2017-08-12 DIAGNOSIS — S61215A Laceration without foreign body of left ring finger without damage to nail, initial encounter: Secondary | ICD-10-CM

## 2017-08-12 DIAGNOSIS — D689 Coagulation defect, unspecified: Secondary | ICD-10-CM

## 2017-08-12 MED ORDER — LIDOCAINE HCL (PF) 2 % IJ SOLN
30.0000 mL | Freq: Once | INTRAMUSCULAR | Status: DC
Start: 2017-08-12 — End: 2017-08-12
  Filled 2017-08-12: qty 30

## 2017-08-12 NOTE — ED Triage Notes (Signed)
Pt sts was cleaning a glass top, when she cut her right ring finger, and was unable to stop the bleeding. Pt is on coumadin, sts last tetanus shot was less than 5 years ago

## 2017-08-12 NOTE — ED Provider Notes (Signed)
Patient states she was cleaning her glass top stove with a blade and when she was retracting the blade it accidentally cut her on her left ring finger. Patient is on a blood thinner, eliquis and it continues to bleed.   Pt is pleasant. Has a 1 cm laceration of her distal LRF on the radial aspect. She has good ROM.       Medical screening examination/treatment/procedure(s) were conducted as a shared visit with non-physician practitioner(s) and myself.  I personally evaluated the patient during the encounter.    Rolland Porter, MD, Barbette Or, MD 08/12/17 [phone]

## 2017-08-12 NOTE — ED Provider Notes (Signed)
[facility] DEPT Provider Note   CSN: [id number] Arrival date & time: 08/11/17  2236     History   Chief Complaint Chief Complaint  Patient presents with  . Finger Injury    HPI Audrey Mullen is a 76 y.o. female.  Patient presents with laceration to left ring finger caused by a knife while cleaning her kitchen last night. No other injury. She is UTD on her tetanus. She reports continued bleeding and is on Eliquis.    The history is provided by the patient. No language interpreter was used.    Past Medical History:  Diagnosis Date  . Anginal pain ([place])   . Arthritis   . Coronary artery disease   . Diabetes mellitus without complication ([place])   . Dysrhythmia    atrail fibrillation  . GERD (gastroesophageal reflux disease)   . Glaucoma    bilat   . Hyperlipidemia   . Hypertension   . Kidney stone   . Urinary tract infection   . Wears glasses     Patient Active Problem List   Diagnosis Date Noted  . Stroke (cerebrum) ([place]) 07/03/2017  . GERD (gastroesophageal reflux disease) 07/03/2017  . CAD (coronary artery disease) 07/03/2017  . UTI (urinary tract infection) 07/03/2017  . CKD (chronic kidney disease), stage III 07/03/2017  . HTN (hypertension) 02/10/2017  . Hyperlipemia 02/10/2017  . Cerebral embolism with cerebral infarction 11/30/2016  . 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 Coronary artery disease due to lipid rich plaque 11/25/2016  . 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[place])   . Abnormal nuclear stress test   . Bacteremia   . Hydronephrosis   . Ureteral stone with hydronephrosis   . Sepsis ([place]) 04/02/2016  . AKI (acute kidney injury) ([place]) 04/02/2016  . Pyelonephritis, acute 04/02/2016  . Atrial fibrillation ([place]) 04/02/2016    Past Surgical History:  Procedure Laterality Date  . CARDIAC CATHETERIZATION N/A 11/25/2016   Procedure: Left Heart Cath and Coronary Angiography;  Surgeon: Lorretta Harp, MD;  Location: [facility];  Service:  Cardiovascular;  Laterality: N/A;  . CORONARY ARTERY BYPASS GRAFT N/A 11/28/2016   Procedure: CORONARY ARTERY BYPASS GRAFTING (CABG) x 4;  Surgeon: Melrose Nakayama, MD;  Location: [facility];  Service: Open Heart Surgery;  Laterality: N/A;  . CYSTOSCOPY WITH RETROGRADE PYELOGRAM, URETEROSCOPY AND STENT PLACEMENT Left 06/03/2016   Procedure: CYSTOSCOPY WITH LEFT RETROGRADE PYELOGRAM, URETEROSCOPY, REMOVAL OF LEFT NEPHROSTOMY TUBE, INSERTION OF DOUBLE J STENT, LEFT;  Surgeon: Carolan Clines, MD;  Location: [facility];  Service: Urology;  Laterality: Left;  . ENDOVEIN HARVEST OF GREATER SAPHENOUS VEIN Right 11/28/2016   Procedure: ENDOVEIN HARVEST OF GREATER SAPHENOUS VEIN;  Surgeon: Melrose Nakayama, MD;  Location: [facility];  Service: Open Heart Surgery;  Laterality: Right;  . EYE SURGERY     cataract removed per left eye   . HOLMIUM LASER APPLICATION Left 8/46/9629   Procedure: HOLMIUM LASER OF STONE ;  Surgeon: Carolan Clines, MD;  Location: [facility];  Service: Urology;  Laterality: Left;  Marland Kitchen MAZE N/A 11/28/2016   Procedure: MAZE;  Surgeon: Melrose Nakayama, MD;  Location: [facility];  Service: Open Heart Surgery;  Laterality: N/A;  . ROTATOR CUFF REPAIR    . TEE WITHOUT CARDIOVERSION N/A 11/28/2016   Procedure: TRANSESOPHAGEAL ECHOCARDIOGRAM (TEE);  Surgeon: Melrose Nakayama, MD;  Location: [facility];  Service: Open Heart Surgery;  Laterality: N/A;    OB History    No data available  Home Medications    Prior to Admission medications   Medication Sig Start Date End Date Taking? Authorizing Provider  apixaban (ELIQUIS) 5 MG TABS tablet Take 1 tablet (5 mg total) by mouth 2 (two) times daily. 07/05/17  Yes Sheikh, Omair Latif, DO  aspirin EC 81 MG tablet Take 1 tablet (81 mg total) by mouth daily. 11/21/16  Yes Bhagat, Bhavinkumar, PA  CARTIA XT 240 MG 24 hr capsule Take 240 mg by mouth daily. 06/07/17  Yes [provider]  carvedilol (COREG) 12.5 MG tablet Take 1 tablet (12.5 mg  total) by mouth 2 (two) times daily with a meal. 03/06/17  Yes Josue Hector, MD  gabapentin (NEURONTIN) 100 MG capsule Take 1 capsule (100 mg total) by mouth at bedtime. 05/12/17  Yes Dennie Bible, NP  latanoprost (XALATAN) 0.005 % ophthalmic solution Place 1 drop into both eyes at bedtime.   Yes [provider]  pantoprazole (PROTONIX) 40 MG tablet TAKE 1 TABLET(40 MG) BY MOUTH DAILY 05/01/17  Yes Josue Hector, MD  rosuvastatin (CRESTOR) 20 MG tablet Take 20 mg by mouth daily.   Yes [provider]  senna-docusate (SENOKOT-S) 8.6-50 MG tablet Take 1 tablet by mouth at bedtime as needed for mild constipation. 07/05/17  Yes Sheikh, Omair Latif, DO  sitaGLIPtin (JANUVIA) 100 MG tablet Take 100 mg by mouth daily.   Yes [provider]    Family History Family History  Problem Relation Age of Onset  . Heart disease Father   . CAD Neg Hx     Social History Social History  Substance Use Topics  . Smoking status: Never Smoker  . Smokeless tobacco: Never Used  . Alcohol use No     Allergies   Patient has no known allergies.   Review of Systems Review of Systems  Constitutional: Negative for diaphoresis.  Skin: Positive for wound.  Neurological: Negative for numbness.  Hematological: Bruises/bleeds easily.     Physical Exam Updated Vital Signs BP (!) 184/85 (BP Location: Right Arm)   Pulse 88   Temp 97.8 F (36.6 C) (Oral)   Resp 18   SpO2 98%   Physical Exam  Constitutional: She is oriented to person, place, and time. She appears well-developed and well-nourished.  Neck: Normal range of motion.  Pulmonary/Chest: Effort normal.  Musculoskeletal:  No tendon dysfunction of finger.  Neurological: She is alert and oriented to person, place, and time.  Skin: Skin is warm and dry.  1 cm linear laceration to distal left 4th finger.      ED Treatments / Results  Labs (all labs ordered are listed, but only abnormal results are  displayed) Labs Reviewed - No data to display  EKG  EKG Interpretation None       Radiology No results found.  Procedures .Marland KitchenLaceration Repair Date/Time: 08/12/2017 7:03 AM Performed by: Charlann Lange Authorized by: Charlann Lange   Consent:    Consent obtained:  Verbal   Consent given by:  Patient Anesthesia (see MAR for exact dosages):    Anesthesia method:  Nerve block   Block needle gauge:  27 G   Block anesthetic:  Lidocaine 1% w/o epi   Block technique:  MC tendon sheath   Block injection procedure:  Anatomic landmarks identified, introduced needle and anatomic landmarks palpated   Block outcome:  Anesthesia achieved Laceration details:    Location:  Finger   Finger location:  L ring finger   Length (cm):  1 Repair type:  Repair type:  Simple Pre-procedure details:    Preparation:  Patient was prepped and draped in usual sterile fashion Exploration:    Hemostasis achieved with:  Tourniquet   Contaminated: no   Treatment:    Area cleansed with:  Betadine and saline   Amount of cleaning:  Standard Skin repair:    Repair method:  Sutures   Suture size:  5-0   Suture material:  Prolene   Suture technique:  Simple interrupted   Number of sutures:  3 Approximation:    Approximation:  Close   Vermilion border: well-aligned   Post-procedure details:    Dressing:  Sterile dressing   Patient tolerance of procedure:  Tolerated well, no immediate complications   (including critical care time)  Medications Ordered in ED Medications  lidocaine (XYLOCAINE) 2 % injection 30 mL (not administered)     Initial Impression / Assessment and Plan / ED Course  I have reviewed the triage vital signs and the nursing notes.  Pertinent labs & imaging results that were available during my care of the patient were reviewed by me and considered in my medical decision making (see chart for details).     Uncomplicated laceration repair left 4th finger.   Final Clinical  Impressions(s) / ED Diagnoses   Final diagnoses:  None   1. Finger laceration 2. Coagulopathy secondary to Eliquis  New Prescriptions New Prescriptions   No medications on file     Charlann Lange, Hershal Coria 08/12/17 0981    Rolland Porter, MD 08/16/17 [phone]

## 2017-08-12 NOTE — ED Notes (Signed)
Called pt's name to be triaged, but no response from the waiting room. 

## 2017-08-19 ENCOUNTER — Ambulatory Visit (INDEPENDENT_AMBULATORY_CARE_PROVIDER_SITE_OTHER): Payer: Medicare Other | Admitting: Ophthalmology

## 2017-08-19 DIAGNOSIS — H348322 Tributary (branch) retinal vein occlusion, left eye, stable: Secondary | ICD-10-CM

## 2017-08-19 DIAGNOSIS — H35033 Hypertensive retinopathy, bilateral: Secondary | ICD-10-CM | POA: Diagnosis not present

## 2017-08-19 DIAGNOSIS — H2511 Age-related nuclear cataract, right eye: Secondary | ICD-10-CM

## 2017-08-19 DIAGNOSIS — I10 Essential (primary) hypertension: Secondary | ICD-10-CM

## 2017-08-19 DIAGNOSIS — H43813 Vitreous degeneration, bilateral: Secondary | ICD-10-CM

## 2017-08-20 DIAGNOSIS — I10 Essential (primary) hypertension: Secondary | ICD-10-CM | POA: Diagnosis not present

## 2017-08-20 DIAGNOSIS — Z4802 Encounter for removal of sutures: Secondary | ICD-10-CM | POA: Diagnosis not present

## 2017-08-21 DIAGNOSIS — R8271 Bacteriuria: Secondary | ICD-10-CM | POA: Diagnosis not present

## 2017-08-21 DIAGNOSIS — N200 Calculus of kidney: Secondary | ICD-10-CM | POA: Diagnosis not present

## 2017-08-21 DIAGNOSIS — R310 Gross hematuria: Secondary | ICD-10-CM | POA: Diagnosis not present

## 2017-09-01 ENCOUNTER — Encounter: Payer: Self-pay | Admitting: Neurology

## 2017-09-01 ENCOUNTER — Ambulatory Visit (INDEPENDENT_AMBULATORY_CARE_PROVIDER_SITE_OTHER): Payer: Medicare Other | Admitting: Neurology

## 2017-09-01 VITALS — BP 134/73 | HR 84 | Ht 64.0 in | Wt 149.0 lb

## 2017-09-01 DIAGNOSIS — Z951 Presence of aortocoronary bypass graft: Secondary | ICD-10-CM

## 2017-09-01 DIAGNOSIS — I6340 Cerebral infarction due to embolism of unspecified cerebral artery: Secondary | ICD-10-CM | POA: Diagnosis not present

## 2017-09-01 DIAGNOSIS — I480 Paroxysmal atrial fibrillation: Secondary | ICD-10-CM

## 2017-09-01 DIAGNOSIS — I10 Essential (primary) hypertension: Secondary | ICD-10-CM | POA: Diagnosis not present

## 2017-09-01 DIAGNOSIS — E785 Hyperlipidemia, unspecified: Secondary | ICD-10-CM

## 2017-09-01 DIAGNOSIS — E119 Type 2 diabetes mellitus without complications: Secondary | ICD-10-CM | POA: Insufficient documentation

## 2017-09-01 DIAGNOSIS — E1159 Type 2 diabetes mellitus with other circulatory complications: Secondary | ICD-10-CM

## 2017-09-01 DIAGNOSIS — Z7901 Long term (current) use of anticoagulants: Secondary | ICD-10-CM

## 2017-09-01 NOTE — Progress Notes (Addendum)
STROKE NEUROLOGY FOLLOW UP NOTE  NAME: Audrey Mullen DOB: July 01, 1941  REASON FOR VISIT: stroke follow up HISTORY FROM: pt and chart  Today we had the pleasure of seeing Audrey Mullen in follow-up at our Neurology Clinic. Pt was accompanied by no one.   History Summary Audrey Mullen is a 76 y.o. female with history of hypertension, hyperlipidemia, atrial fibrillation on coumadin, diabetes mellitus, and coronary artery disease admitted on 11/25/16 for CABG, however developed right-sided weakness and aphasia post CABG procedure. She did not receive IV t-PA due to recent surgery. MRI not able to do due to sternal wires, however concerning for left brain infarcts. CTA head and neck showed left ICA bifurcation 50% stenosis, bilateral cavernous ICA atherosclerosis. CT repeat no acute infarct. Intraoperative TEE showed EF 60-65%. LE venous Doppler no DVT. LDL 89 and A1c 7.6. Hb 6.6 and received blood transfusion. Patient stroke considered procedure related or episodes of hypotension and anemia in the setting of carotid atherosclerosis. Coumadin resumed, INR goal 2-3. Aspirin and Crestor also continued.  02/10/17 follow up (CM) - On follow-up visit in the stroke clinic today she has not had further stroke or TIA symptoms. She remains on Coumadin and aspirin  for secondary stroke prevention and anticoagulation. Blood pressure in the office today 140/80. She remains on Crestor 20 mg daily for hyperlipidemia. She denies any myalgias. For her diabetes she claims Dr. Criss Rosales has her on Mediterranean diet and she is supposed to be walking for exercise. She claims she is back to most of her normal activities. She claims she occasionally has problems getting her words out usually occurs when she is fatigued. Offered some speech therapy that she would like to hold off at present. She returns for reevaluation,  she is back to driving without difficulty. She is back to most of her normal  activities. She lives with her husband.  05/12/17 follow up (CM) - She is currently on Coumadin and aspirin without further stroke or TIA symptoms. She has minimal bruising and no bleeding. Blood pressure in the office today 150/94 she has not taken her blood pressure medications morning. She remains on Crestor for hyperlipidemia without complaints of myalgias. She has a new complaint of numbness in both hands comes and goes but the pain in her hands can wake her up at night. She continues to go to water aerobics and she walks for exercise. She is independent in all activities of daily living and drives without difficulty. She is continuing to follow a diabetic diet. She returns for reevaluation.  Admission 07/03/17 - patient admitted for episode of dysarthria and left-sided weakness. MRI showed small left CTR and right ACA infarcts, likely due to A. fib with fluctuating INR on Coumadin. Repeat CTA head and neck no significant change, continued to show left ICA 50% stenosis with calcified plaques. Carotid Doppler unremarkable, EF 60-65%. LDL 45 and A1c 8.8. Her Coumadin changed to eliquis. And aspirin and Crestor continued.  Interval History During the interval time, the patient has been doing well. No recurrent stroke like symptoms. BP controlled well at home, today 134/73 in clinic. She did not check glucose at home, but recently put on Januvia, and close follow-up with PCP. She complains of bilateral hand and fingertips numbness, tingling and achy pain. She has an appointment with hand surgeon this Friday.  REVIEW OF SYSTEMS: Full 14 system review of systems performed and notable only for those listed below and in HPI above, all others are  negative:  Constitutional:   Cardiovascular:  Ear/Nose/Throat:   Skin:  Eyes:   Respiratory:   Gastroitestinal:   Genitourinary:  Hematology/Lymphatic:   Endocrine:  Musculoskeletal:  Back pain, walking difficulty Allergy/Immunology:   Neurological:     Psychiatric:  Sleep:   The following represents the patient's updated allergies and side effects list: No Known Allergies  The neurologically relevant items on the patient's problem list were reviewed on today's visit.  Neurologic Examination  A problem focused neurological exam (12 or more points of the single system neurologic examination, vital signs counts as 1 point, cranial nerves count for 8 points) was performed.  Blood pressure 134/73, pulse 84, height 5\' 4"  (1.626 m), weight 149 lb (67.6 kg).  General - Well nourished, well developed, in no apparent distress.  Ophthalmologic - Sharp disc margins OU. .  Cardiovascular - Regular rate and rhythm, not in afib.  Mental Status -  Level of arousal and orientation to time, place, and person were intact. Language including expression, naming, repetition, comprehension was assessed and found intact. Fund of Knowledge was assessed and was intact.  Cranial Nerves II - XII - II - Visual field intact OU. III, IV, VI - Extraocular movements intact. V - Facial sensation intact bilaterally. VII - mild nasolabial fold flattening on the right. VIII - Hearing & vestibular intact bilaterally. X - Palate elevates symmetrically. XI - Chin turning & shoulder shrug intact bilaterally. XII - Tongue protrusion intact.  Motor Strength - The patient's strength was normal in all extremities and pronator drift was absent.  Bulk was normal and fasciculations were absent.   Motor Tone - Muscle tone was assessed at the neck and appendages and was normal.  Reflexes - The patient's reflexes were 1+ in all extremities and she had no pathological reflexes.  Sensory - Light touch, temperature/pinprick were assessed and were normal.    Coordination - The patient had normal movements in the hands with no ataxia or dysmetria.  Tremor was absent.  Gait and Station - walk with cane, small stride.    Functional score  mRS = 1   0 - No symptoms.    1 - No significant disability. Able to carry out all usual activities, despite some symptoms.   2 - Slight disability. Able to look after own affairs without assistance, but unable to carry out all previous activities.   3 - Moderate disability. Requires some help, but able to walk unassisted.   4 - Moderately severe disability. Unable to attend to own bodily needs without assistance, and unable to walk unassisted.   5 - Severe disability. Requires constant nursing care and attention, bedridden, incontinent.   6 - Dead.   NIH Stroke Scale = 0   Data reviewed: I personally reviewed the images and agree with the radiology interpretations.  Ct Angio Head and Neck W Or Wo Contrast 11/30/2016 1. No intracranial arterial occlusion or high-grade stenosis.  2. 50% narrowing of the proximal left internal carotid artery secondary to calcified atherosclerotic plaque.  3. Marked cervical degenerative disc disease with suspected mild-to-moderate spinal canal stenosis at the C4 and C5 levels.   Dg Chest Port 1 View 11/30/2016 1. Interval mild pulmonary vascular congestion and mild interstitial edema.  2. Stable dense left lower lobe atelectasis, pneumonia or aspiration pneumonitis.   Ct Head Code Stroke Wo Contrast 11/30/2016 1. No intracranial hemorrhage.  2. Left subinsular hypoattenuation is favored to be chronic, given preservation of overlying cortex. No other evidence of  acute intracranial abnormality.  3. ASPECTS is 9.    TEE intra-op 11/28/2016 Left Ventricle LV systolic function is normal with an EF of 60-65%.    Septum Normal atrial and ventricular septum with no septal defect and normal septal motion.    Left Atrium Left atrium normal in structure and function.    Aortic Valve Structurally normal trileaflet aortic valve with no regurgitation or stenosis.    Aorta No aneurysm present. Plaque present in the descending aorta is mild (Grade 2: Mild-focal or diffuse int.  thick. of 2-25mm). No graft present. No significant coarctation present. No aortic dissection present    Mitral Valve Normal valve structure. Normal transmitral gradient. No stenosis. Normal leaflet mobility. No regurgitation.    Right Ventricle Normal cavity size, wall thickness and ejection fraction.    Right Atrium Normal right atrial size.    Tricuspid Valve Normal tricuspid valve structure. No stenosis. Trace regurgitation. The tricuspid valve regurgitation jet is central.    Pulmonic Valve Normal pulmonic valve structure. No stenosis. Trace regurgitation.    Pericardium Normal pericardium with no pericardial effusion.    Post-Op TEE AORTA Aorta unchanged from pre-bypass.  LEFT VENTRICLE Post-op, normal LV cavity size. Post-op, LV cavity is normal and ejection fraction is normal. Wall motion is normal.  RIGHT VENTRICLE Right ventricle unchanged from pre-bypass.  AORTIC VALVE Aortic valve unchanged from pre-bypass MITRAL VALVE Mitral valve unchanged from pre-bypass.  TRICUSPID VALVE Tricuspid valve unchanged from pre-bypass.     CT head repeat  12/01/2016 Unchanged examination without intracranial hemorrhage or CT evidence of acute infarct.  LE venous doppler - There is no evidence of deep or superficial vein thrombosis involving the right and left lower extremities. All visualized vessels appear patent and compressible. There is no evidence of Baker's cysts bilaterally.  Ct Angio Head W Or Wo Contrast Ct Angio Neck W Or Wo Contrast 07/03/2017 1. No emergent large vessel occlusion or intracranial stenosis.  2. Approximately 50% narrowing of the left skullbase ICA in the distal petrous and lacerum segments.  3. Mild bilateral carotid and Aortic Atherosclerosis (ICD10-I70.0) without dissection or significant stenosis.  4. Normal vertebral arteries.   Ct Cervical Spine Wo Contrast 07/03/2017 1. No acute fracture or static subluxation of the cervical spine.  2.  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   Mr Brain Wo Contrast 07/03/2017 1. Tiny RIGHT and parietal LEFT on subacute infarcts versus artifact.  2. Moderate chronic small vessel ischemic disease.   Ct Head Code Stroke Wo Contrast 07/03/2017 1. No acute finding by CT. Atrophy an extensive chronic small vessel ischemic changes throughout the cerebral hemispheres. 2. ASPECTS is 10.   Carotid ultrasound - Findings consistent with a 1-39 percent stenosis involving the right internal carotid artery and the left internal carotid artery. Bilateral vertebral arteries appear patent and antegrade. Difficult visualization od plaque due to acoustic shadowing.  Transthoracic echocardiogram 07/04/2017 Study Conclusions - Left ventricle: The cavity size was normal. There was mild concentric hypertrophy. Systolic function was normal. The estimated ejection fraction was in the range of 60% to 65%. Wall motion was normal; there were no regional wall motion abnormalities. - Aortic valve: Transvalvular velocity was within the normal range. There was no stenosis. There was no regurgitation. - Mitral valve: Transvalvular velocity was within the normal range. There was no evidence for stenosis. There was trivial regurgitation. - Right ventricle: The cavity size was normal. Wall thickness was normal. Systolic function was  normal. - Atrial septum: No defect or patent foramen ovale was identified by color flow Doppler. - Tricuspid valve: There was trivial regurgitation. Regurgitant peak velocity: 259 cm/s. - Pulmonary arteries: Systolic pressure was within the normal range. PA peak pressure: 30 mm Hg (S).  Component     Latest Ref Rng & Units 11/27/2016 11/28/2016 07/04/2017  Cholesterol     0 - 200 mg/dL 166  96  Triglycerides     <150 mg/dL 181 (H)  91  HDL Cholesterol     >40 mg/dL 41  33 (L)  Total CHOL/HDL Ratio      RATIO 4.0  2.9  VLDL     0 - 40 mg/dL 36  18  LDL (calc)     0 - 99 mg/dL 89  45  Hemoglobin A1C     4.8 - 5.6 %  7.6 (H) 8.8 (H)  Mean Plasma Glucose     mg/dL  171 205.86    Assessment: As you may recall, she is a 76 y.o. African American female with PMH of HTN, HLD, afib on coumadin, DM, and CAD admitted on 11/25/16 for CABG, however developed right-sided weakness and aphasia post CABG procedure. MRI not able to do due to sternal wires, however concerning for left brain infarcts. CTA head and neck showed left ICA bifurcation 50% stenosis, bilateral cavernous ICA atherosclerosis. CT repeat no acute infarct. Intraoperative TEE showed EF 60-65%. LE venous Doppler no DVT. LDL 89 and A1c 7.6. Hb 6.6 and received blood transfusion. Patient stroke considered procedure related or episodes of hypotension and anemia in the setting of carotid atherosclerosis. Coumadin resumed, INR goal 2-3. Aspirin and Crestor also continued. Re-admitted on 07/03/17 for episode of dysarthria and left-sided weakness. MRI showed small left CTR and right ACA infarcts, likely due to A. fib with fluctuating INR on Coumadin. Repeat CTA head and neck no significant change, continued to show left ICA 50% stenosis with calcified plaques. Carotid Doppler unremarkable, EF 60-65%. LDL 45 and A1c 8.8. Her Coumadin changed to eliquis. And aspirin and Crestor continued. So far doing well. Complains of b/l hand fingertip numbness, tingling and achy pain, likely diabetic neuropathy.  Plan:  - continue eliquis and ASA and crestor for stroke and cardiac prevention - check BP and glucose at home and record - follow up cardiology for afib - Follow up with your primary care physician for stroke risk factor modification. Recommend maintain blood pressure goal <130/80, diabetes with hemoglobin A1c goal below 7.0% and lipids with LDL cholesterol goal below 70 mg/dL.  - diabetic diet and regular exercise  - follow up with hand surgeon, but likely  diabetic neuropathy both hands. And may consider EMG/NCS and gabapentin if needed.  - follow up in 4 months.  I spent more than 25 minutes of face to face time with the patient. Greater than 50% of time was spent in counseling and coordination of care. We discussed medication compliance, glucose monitoring, diabetic neuropathy.   No orders of the defined types were placed in this encounter.   No orders of the defined types were placed in this encounter.   Patient Instructions  - continue eliquis and ASA and crestor for stroke and cardiac prevention - check BP and glucose at home and record - follow up cardiology for afib - Follow up with your primary care physician for stroke risk factor modification. Recommend maintain blood pressure goal <130/80, diabetes with hemoglobin A1c goal below 7.0% and lipids with LDL cholesterol goal below  70 mg/dL.  - diabetic diet and regular exercise  - follow up with hand surgeon, but I think you have diabetic neuropathy both hands. And may consider EMG/NCS and gabapentin if needed.  - follow up in 4 months.    Rosalin Hawking, MD PhD [facility] [address], [place], [place] [address] [phone]

## 2017-09-01 NOTE — Patient Instructions (Addendum)
-   continue eliquis and ASA and crestor for stroke and cardiac prevention - check BP and glucose at home and record - follow up cardiology for afib - Follow up with your primary care physician for stroke risk factor modification. Recommend maintain blood pressure goal <130/80, diabetes with hemoglobin A1c goal below 7.0% and lipids with LDL cholesterol goal below 70 mg/dL.  - diabetic diet and regular exercise  - follow up with hand surgeon, but I think you have diabetic neuropathy both hands. And may consider EMG/NCS and gabapentin if needed.  - follow up in 4 months.

## 2017-09-05 DIAGNOSIS — G5602 Carpal tunnel syndrome, left upper limb: Secondary | ICD-10-CM | POA: Diagnosis not present

## 2017-09-05 DIAGNOSIS — G5601 Carpal tunnel syndrome, right upper limb: Secondary | ICD-10-CM | POA: Diagnosis not present

## 2017-09-22 DIAGNOSIS — G5601 Carpal tunnel syndrome, right upper limb: Secondary | ICD-10-CM | POA: Diagnosis not present

## 2017-09-22 DIAGNOSIS — G5602 Carpal tunnel syndrome, left upper limb: Secondary | ICD-10-CM | POA: Diagnosis not present

## 2017-09-25 DIAGNOSIS — E119 Type 2 diabetes mellitus without complications: Secondary | ICD-10-CM | POA: Diagnosis not present

## 2017-09-25 DIAGNOSIS — I10 Essential (primary) hypertension: Secondary | ICD-10-CM | POA: Diagnosis not present

## 2017-09-25 DIAGNOSIS — I6930 Unspecified sequelae of cerebral infarction: Secondary | ICD-10-CM | POA: Diagnosis not present

## 2017-09-25 DIAGNOSIS — Z4802 Encounter for removal of sutures: Secondary | ICD-10-CM | POA: Diagnosis not present

## 2017-10-01 DIAGNOSIS — I480 Paroxysmal atrial fibrillation: Secondary | ICD-10-CM | POA: Diagnosis not present

## 2017-10-01 DIAGNOSIS — I119 Hypertensive heart disease without heart failure: Secondary | ICD-10-CM | POA: Diagnosis not present

## 2017-10-01 DIAGNOSIS — E119 Type 2 diabetes mellitus without complications: Secondary | ICD-10-CM | POA: Diagnosis not present

## 2017-10-01 DIAGNOSIS — I10 Essential (primary) hypertension: Secondary | ICD-10-CM | POA: Diagnosis not present

## 2017-10-03 DIAGNOSIS — G5601 Carpal tunnel syndrome, right upper limb: Secondary | ICD-10-CM | POA: Diagnosis not present

## 2017-10-03 DIAGNOSIS — G5602 Carpal tunnel syndrome, left upper limb: Secondary | ICD-10-CM | POA: Diagnosis not present

## 2017-10-07 ENCOUNTER — Telehealth: Payer: Self-pay

## 2017-10-07 DIAGNOSIS — H04123 Dry eye syndrome of bilateral lacrimal glands: Secondary | ICD-10-CM | POA: Diagnosis not present

## 2017-10-07 DIAGNOSIS — H25011 Cortical age-related cataract, right eye: Secondary | ICD-10-CM | POA: Diagnosis not present

## 2017-10-07 DIAGNOSIS — H401211 Low-tension glaucoma, right eye, mild stage: Secondary | ICD-10-CM | POA: Diagnosis not present

## 2017-10-07 DIAGNOSIS — H401221 Low-tension glaucoma, left eye, mild stage: Secondary | ICD-10-CM | POA: Diagnosis not present

## 2017-10-07 DIAGNOSIS — Z961 Presence of intraocular lens: Secondary | ICD-10-CM | POA: Diagnosis not present

## 2017-10-07 NOTE — Telephone Encounter (Signed)
   [facility] Pre-operative Risk Assessment    Request for surgical clearance:  1. What type of surgery is being performed? Right Wrist: right hand carpal runnel release    2. When is this surgery scheduled? 10/27/2017   3. Are there any medications that need to be held prior to surgery and how long?none listed    4. Practice name and name of physician performing surgery?  [facility]  2. Dr. Linna Hoff    5. What is your office phone and fax number?  1. Phone [phone] 2. Fax [phone] Attn: [name]    6. Anesthesia type (None, local, MAC, general) ? None listed    Audrey Mullen 10/07/2017, 12:20 PM  _________________________________________________________________   (provider comments below)

## 2017-10-07 NOTE — Telephone Encounter (Signed)
Pt takes Eliquis for afib with CHADS2VASc score of 8 (age-50, sex, CAD, CVA, HTN, DM). CVA was 3 months ago when pt took Coumadin and her INR was SUPRAtherapeutic on admit at 3.51. Ideally would prefer to continue Eliquis if possible given very high cardiac risk and recent stroke when anticoagulated. Clearance faxed to below number. Please call [phone] with questions.

## 2017-10-07 NOTE — Telephone Encounter (Signed)
This encounter was created in error - please disregard.

## 2017-10-07 NOTE — Telephone Encounter (Signed)
   Chart reviewed as part of pre-operative protocol coverage. Given past medical history and time since last visit, based on ACC/AHA guidelines, Audrey Mullen would be at acceptable risk for the planned procedure without further cardiovascular testing.   She had CABG earlier this year. No ischemic CP or dyspnea. She has h/o PAF and stroke. She is on Eliquis. Will route to pharmacy to address a/c. Once addressed, will route  recommendation to the requesting party via Epic fax function and remove from pre-op pool.  Please call with questions.  Lyda Jester, PA-C 10/07/2017, 4:20 PM

## 2017-10-07 NOTE — Telephone Encounter (Signed)
   Chart reviewed as part of pre-operative protocol coverage. Pt last seen by in our office 06/2017. Phone call was made to check with pt to see if any new cardiac symptoms that would warrant office visit. Calls made to home phone and mobile. Pt instructed to call the preop clinic between the hours of 1:30 pm and 5 pm. Clearance pending.    Lyda Jester, PA-C 10/07/2017, 4:03 PM

## 2017-10-07 NOTE — Telephone Encounter (Signed)
   [facility] Pre-operative Risk Assessment    Request for surgical clearance:  1. What type of surgery is being performed? Right Wrist: right hand carpal runnel release    2. When is this surgery scheduled? 10/27/2017   3. Are there any medications that need to be held prior to surgery and how long?none listed    4. Practice name and name of physician performing surgery?  [facility]  2. Dr. Linna Hoff    5. What is your office phone and fax number?  1. Phone [phone] 2. Fax [phone] Attn: [name]    6. Anesthesia type (None, local, MAC, general) ? None listed    Audrey Mullen 10/07/2017, 12:20 PM  _________________________________________________________________   (provider comments below)

## 2017-10-07 NOTE — Addendum Note (Signed)
Addended by: Teressa Senter on: 10/07/2017 04:58 PM   Modules accepted: Level of Service, SmartSet

## 2017-10-14 NOTE — Progress Notes (Signed)
Cardiology Office Note   Date:  10/17/2017   ID:  Krystelle, Prashad 23-Aug-1941, MRN [id number]  PCP:  Lucianne Lei, MD  Cardiologist:  Dr. Johnsie Cancel    Chief Complaint  Patient presents with  . Follow-up    Afib w/RVR      History of Present Illness: Audrey Mullen is a 76 y.o. female who presents for f/u CAD and PAF.   She has a fib and CAD with hx of CABG.  GERD as well. CHA2DS2-VASc Score8, needs oral anticoagulation.  CVA post CABG with right sided weakness no high grade carotid disease thought to be related to CABG with anemia, hypotension and carotid atherosclerosis and not embolic 8/36/62 dysarthria and left sided weakness and coumadin changed to eliquis due to difficult to control INR and thoughts 2nd stroke from PAF .   Echo in hospital with EF 60-65%, no WMA, trivial MR, PA pressure was 30 mmHg.   She has no chest pain or SOB. She needs right carpal tunnel release in December Discussed with pharmacy and plan To hold eliquis 2 days before surgery   Having more carpal tunnel symptoms bilateral numbness finger tips. Apparently this can be done without stopping  eliquis    Past Medical History:  Diagnosis Date  . Anginal pain ([place])   . Arthritis   . Coronary artery disease   . Diabetes mellitus without complication ([place])   . Dysrhythmia    atrail fibrillation  . GERD (gastroesophageal reflux disease)   . Glaucoma    bilat   . Hyperlipidemia   . Hypertension   . Kidney stone   . Urinary tract infection   . Wears glasses     Past Surgical History:  Procedure Laterality Date  . CARDIAC CATHETERIZATION N/A 11/25/2016   Procedure: Left Heart Cath and Coronary Angiography;  Surgeon: Lorretta Harp, MD;  Location: [place] [facility];  Service: Cardiovascular;  Laterality: N/A;  . CORONARY ARTERY BYPASS GRAFT N/A 11/28/2016   Procedure: CORONARY ARTERY BYPASS GRAFTING (CABG) x 4;  Surgeon: Melrose Nakayama, MD;  Location: [place];  Service: Open  Heart Surgery;  Laterality: N/A;  . CYSTOSCOPY WITH RETROGRADE PYELOGRAM, URETEROSCOPY AND STENT PLACEMENT Left 06/03/2016   Procedure: CYSTOSCOPY WITH LEFT RETROGRADE PYELOGRAM, URETEROSCOPY, REMOVAL OF LEFT NEPHROSTOMY TUBE, INSERTION OF DOUBLE J STENT, LEFT;  Surgeon: Carolan Clines, MD;  Location: [facility];  Service: Urology;  Laterality: Left;  . ENDOVEIN HARVEST OF GREATER SAPHENOUS VEIN Right 11/28/2016   Procedure: ENDOVEIN HARVEST OF GREATER SAPHENOUS VEIN;  Surgeon: Melrose Nakayama, MD;  Location: [place];  Service: Open Heart Surgery;  Laterality: Right;  . EYE SURGERY     cataract removed per left eye   . HOLMIUM LASER APPLICATION Left 9/47/6546   Procedure: HOLMIUM LASER OF STONE ;  Surgeon: Carolan Clines, MD;  Location: [facility];  Service: Urology;  Laterality: Left;  Marland Kitchen MAZE N/A 11/28/2016   Procedure: MAZE;  Surgeon: Melrose Nakayama, MD;  Location: [place];  Service: Open Heart Surgery;  Laterality: N/A;  . ROTATOR CUFF REPAIR    . TEE WITHOUT CARDIOVERSION N/A 11/28/2016   Procedure: TRANSESOPHAGEAL ECHOCARDIOGRAM (TEE);  Surgeon: Melrose Nakayama, MD;  Location: [place];  Service: Open Heart Surgery;  Laterality: N/A;     Current Outpatient Medications  Medication Sig Dispense Refill  . apixaban (ELIQUIS) 5 MG TABS tablet Take 1 tablet (5 mg total) by mouth 2 (two) times daily. 60 tablet 0  .  aspirin EC 81 MG tablet Take 1 tablet (81 mg total) by mouth daily. 90 tablet 3  . CARTIA XT 240 MG 24 hr capsule Take 240 mg by mouth daily.  3  . carvedilol (COREG) 12.5 MG tablet Take 1 tablet (12.5 mg total) by mouth 2 (two) times daily with a meal. 60 tablet 11  . gabapentin (NEURONTIN) 100 MG capsule Take 1 capsule (100 mg total) by mouth at bedtime. 90 capsule 1  . latanoprost (XALATAN) 0.005 % ophthalmic solution Place 1 drop into both eyes at bedtime.    . pantoprazole (PROTONIX) 40 MG tablet TAKE 1 TABLET(40 MG) BY MOUTH DAILY 30 tablet 11  . rosuvastatin (CRESTOR)  20 MG tablet Take 20 mg by mouth daily.    Marland Kitchen senna-docusate (SENOKOT-S) 8.6-50 MG tablet Take 1 tablet by mouth at bedtime as needed for mild constipation. 30 tablet 0  . sitaGLIPtin (JANUVIA) 100 MG tablet Take 100 mg by mouth daily.     No current facility-administered medications for this visit.     Allergies:   Patient has no known allergies.    Social History:  The patient  reports that  has never smoked. she has never used smokeless tobacco. She reports that she does not drink alcohol or use drugs.   Family History:  The patient's family history includes Heart disease in her father.    ROS:  General:no colds or fevers, no weight changes Skin:no rashes or ulcers HEENT:no blurred vision, no congestion CV:see HPI PUL:see HPI GI:no diarrhea constipation or melena, no indigestion GU:no hematuria, no dysuria MS:no joint pain, no claudication Neuro:no syncope, no lightheadedness Endo:+ diabetes, no thyroid disease  Wt Readings from Last 3 Encounters:  10/17/17 157 lb 12.8 oz (71.6 kg)  09/01/17 149 lb (67.6 kg)  07/11/17 149 lb (67.6 kg)     PHYSICAL EXAM: VS:  BP 138/80   Pulse 82   Ht 5\' 4"  (1.626 m)   Wt 157 lb 12.8 oz (71.6 kg)   SpO2 97%   BMI 27.09 kg/m  , BMI Body mass index is 27.09 kg/m. General:Pleasant affect, NAD Skin:Warm and dry, brisk capillary refill HEENT:normocephalic, sclera clear, mucus membranes moist Neck:supple, no JVD, no bruits  Heart:S1S2 RRR without murmur, gallup, rub or click Lungs:clear without rales, rhonchi, or wheezes KZS:WFUX, non tender, + BS, do not palpate liver spleen or masses Ext:no lower ext edema, 2+ pedal pulses, 2+ radial pulses Neuro:alert and oriented X 3, MAE, follows commands, + facial symmetry    EKG:  EKG is NOT ordered today.    Recent Labs: 11/07/2016: Brain Natriuretic Peptide 18.9; TSH 0.40 07/05/2017: ALT 68; BUN 24; Creatinine, Ser 1.54; Hemoglobin 11.4; Magnesium 2.0; Platelets 202; Potassium 4.2; Sodium  136    Lipid Panel    Component Value Date/Time   CHOL 96 07/04/2017 0511   TRIG 91 07/04/2017 0511   HDL 33 (L) 07/04/2017 0511   CHOLHDL 2.9 07/04/2017 0511   VLDL 18 07/04/2017 0511   LDLCALC 45 07/04/2017 0511       Other studies Reviewed: Additional studies/ records that were reviewed today include: . Echo 07/04/17  Study Conclusions  - Left ventricle: The cavity size was normal. There was mild   concentric hypertrophy. Systolic function was normal. The   estimated ejection fraction was in the range of 60% to 65%. Wall   motion was normal; there were no regional wall motion   abnormalities. - Aortic valve: Transvalvular velocity was within the normal range.  There was no stenosis. There was no regurgitation. - Mitral valve: Transvalvular velocity was within the normal range.   There was no evidence for stenosis. There was trivial   regurgitation. - Right ventricle: The cavity size was normal. Wall thickness was   normal. Systolic function was normal. - Atrial septum: No defect or patent foramen ovale was identified   by color flow Doppler. - Tricuspid valve: There was trivial regurgitation. Regurgitant   peak velocity: 259 cm/s. - Pulmonary arteries: Systolic pressure was within the normal   range. PA peak pressure: 30 mm Hg (S).  Carotid dopplers Summary: Findings consistent with a 1-39 percent stenosis involving the right internal carotid artery and the left internal carotid artery. Bilateral vertebral arteries appear patent and antegrade. Difficult visualization od plaque due to acoustic shadowing.   ASSESSMENT AND PLAN:  1.  CVA 07/03/17  Coumadin changed to Eliquis.  Also one post CABG.     2.  Mild carotid disease, on statin  3.  PAF s/p Maze on Eliquis with CVA on coumadin and levels up and down.  4.  CAD s.p CABG in 2018  5. HLD on statin last LDL 45 this month  6.  DM-2 now on Januvia per PCP   7. Carpal Tunnel:  Ok to have surgery ideally  without stopping eliquis but if she had to hold it 2 days prior That would probably be ok given the amount of pain she has and functional disability she needs something done    Current medicines are reviewed with the patient today.  The patient Has no concerns regarding medicines.  The following changes have been made:  See above Labs/ tests ordered today include:see above  Disposition:   FU:  see above  Signed, Jenkins Rouge, MD  10/17/2017 11:59 AM    [facility] [place], [place], [place], [place] Phone: [phone]; Fax: [phone]

## 2017-10-17 ENCOUNTER — Ambulatory Visit: Payer: Medicare Other | Admitting: Cardiovascular Disease

## 2017-10-17 ENCOUNTER — Encounter: Payer: Self-pay | Admitting: Cardiovascular Disease

## 2017-10-17 VITALS — BP 138/80 | HR 82 | Ht 64.0 in | Wt 157.8 lb

## 2017-10-17 DIAGNOSIS — I25810 Atherosclerosis of coronary artery bypass graft(s) without angina pectoris: Secondary | ICD-10-CM

## 2017-10-17 DIAGNOSIS — I63321 Cerebral infarction due to thrombosis of right anterior cerebral artery: Secondary | ICD-10-CM | POA: Diagnosis not present

## 2017-10-17 DIAGNOSIS — E785 Hyperlipidemia, unspecified: Secondary | ICD-10-CM | POA: Diagnosis not present

## 2017-10-17 DIAGNOSIS — I480 Paroxysmal atrial fibrillation: Secondary | ICD-10-CM

## 2017-10-17 NOTE — Patient Instructions (Signed)

## 2017-10-29 DIAGNOSIS — G5601 Carpal tunnel syndrome, right upper limb: Secondary | ICD-10-CM | POA: Diagnosis not present

## 2017-11-03 ENCOUNTER — Ambulatory Visit: Payer: Medicare Other | Admitting: Nurse Practitioner

## 2017-11-13 DIAGNOSIS — G5601 Carpal tunnel syndrome, right upper limb: Secondary | ICD-10-CM | POA: Diagnosis not present

## 2017-12-17 DIAGNOSIS — M79641 Pain in right hand: Secondary | ICD-10-CM | POA: Diagnosis not present

## 2017-12-19 DIAGNOSIS — M79641 Pain in right hand: Secondary | ICD-10-CM | POA: Diagnosis not present

## 2017-12-25 DIAGNOSIS — E119 Type 2 diabetes mellitus without complications: Secondary | ICD-10-CM | POA: Diagnosis not present

## 2017-12-25 DIAGNOSIS — I10 Essential (primary) hypertension: Secondary | ICD-10-CM | POA: Diagnosis not present

## 2017-12-25 DIAGNOSIS — I6930 Unspecified sequelae of cerebral infarction: Secondary | ICD-10-CM | POA: Diagnosis not present

## 2017-12-25 DIAGNOSIS — I480 Paroxysmal atrial fibrillation: Secondary | ICD-10-CM | POA: Diagnosis not present

## 2017-12-25 DIAGNOSIS — E785 Hyperlipidemia, unspecified: Secondary | ICD-10-CM | POA: Diagnosis not present

## 2017-12-30 DIAGNOSIS — E782 Mixed hyperlipidemia: Secondary | ICD-10-CM | POA: Diagnosis not present

## 2017-12-30 DIAGNOSIS — I119 Hypertensive heart disease without heart failure: Secondary | ICD-10-CM | POA: Diagnosis not present

## 2017-12-30 DIAGNOSIS — G5603 Carpal tunnel syndrome, bilateral upper limbs: Secondary | ICD-10-CM | POA: Diagnosis not present

## 2017-12-30 DIAGNOSIS — I6340 Cerebral infarction due to embolism of unspecified cerebral artery: Secondary | ICD-10-CM | POA: Diagnosis not present

## 2017-12-30 DIAGNOSIS — E119 Type 2 diabetes mellitus without complications: Secondary | ICD-10-CM | POA: Diagnosis not present

## 2018-01-03 ENCOUNTER — Other Ambulatory Visit: Payer: Self-pay | Admitting: Physician Assistant

## 2018-01-05 ENCOUNTER — Encounter: Payer: Self-pay | Admitting: Neurology

## 2018-01-05 ENCOUNTER — Ambulatory Visit: Payer: Medicare Other | Admitting: Neurology

## 2018-01-05 VITALS — BP 138/71 | HR 63 | Wt 146.0 lb

## 2018-01-05 DIAGNOSIS — I10 Essential (primary) hypertension: Secondary | ICD-10-CM

## 2018-01-05 DIAGNOSIS — E1159 Type 2 diabetes mellitus with other circulatory complications: Secondary | ICD-10-CM

## 2018-01-05 DIAGNOSIS — H2511 Age-related nuclear cataract, right eye: Secondary | ICD-10-CM | POA: Diagnosis not present

## 2018-01-05 DIAGNOSIS — Z7901 Long term (current) use of anticoagulants: Secondary | ICD-10-CM | POA: Diagnosis not present

## 2018-01-05 DIAGNOSIS — E785 Hyperlipidemia, unspecified: Secondary | ICD-10-CM

## 2018-01-05 DIAGNOSIS — H40013 Open angle with borderline findings, low risk, bilateral: Secondary | ICD-10-CM | POA: Diagnosis not present

## 2018-01-05 DIAGNOSIS — I480 Paroxysmal atrial fibrillation: Secondary | ICD-10-CM

## 2018-01-05 DIAGNOSIS — Z961 Presence of intraocular lens: Secondary | ICD-10-CM | POA: Diagnosis not present

## 2018-01-05 DIAGNOSIS — I6340 Cerebral infarction due to embolism of unspecified cerebral artery: Secondary | ICD-10-CM

## 2018-01-05 DIAGNOSIS — E113293 Type 2 diabetes mellitus with mild nonproliferative diabetic retinopathy without macular edema, bilateral: Secondary | ICD-10-CM | POA: Diagnosis not present

## 2018-01-05 DIAGNOSIS — Z951 Presence of aortocoronary bypass graft: Secondary | ICD-10-CM

## 2018-01-05 NOTE — Progress Notes (Signed)
STROKE NEUROLOGY FOLLOW UP NOTE  NAME: Audrey Mullen DOB: Aug 16, 1941  REASON FOR VISIT: stroke follow up HISTORY FROM: pt and chart  Today we had the pleasure of seeing Audrey Mullen in follow-up at our Neurology Clinic. Pt was accompanied by no one.   History Summary Ms. Audrey Mullen is a 77 y.o. female with history of hypertension, hyperlipidemia, atrial fibrillation on coumadin, diabetes mellitus, and coronary artery disease admitted on 11/25/16 for CABG, however developed right-sided weakness and aphasia post CABG procedure. She did not receive IV t-PA due to recent surgery. MRI not able to do due to sternal wires, however concerning for left brain infarcts. CTA head and neck showed left ICA bifurcation 50% stenosis, bilateral cavernous ICA atherosclerosis. CT repeat no acute infarct. Intraoperative TEE showed EF 60-65%. LE venous Doppler no DVT. LDL 89 and A1c 7.6. Hb 6.6 and received blood transfusion. Patient stroke considered procedure related or episodes of hypotension and anemia in the setting of carotid atherosclerosis. Coumadin resumed, INR goal 2-3. Aspirin and Crestor also continued.  02/10/17 follow up (CM) - On follow-up visit in the stroke clinic today she has not had further stroke or TIA symptoms. She remains on Coumadin and aspirin  for secondary stroke prevention and anticoagulation. Blood pressure in the office today 140/80. She remains on Crestor 20 mg daily for hyperlipidemia. She denies any myalgias. For her diabetes she claims Dr. Criss Rosales has her on Mediterranean diet and she is supposed to be walking for exercise. She claims she is back to most of her normal activities. She claims she occasionally has problems getting her words out usually occurs when she is fatigued. Offered some speech therapy that she would like to hold off at present. She returns for reevaluation,  she is back to driving without difficulty. She is back to most of her normal  activities. She lives with her husband.  05/12/17 follow up (CM) - She is currently on Coumadin and aspirin without further stroke or TIA symptoms. She has minimal bruising and no bleeding. Blood pressure in the office today 150/94 she has not taken her blood pressure medications morning. She remains on Crestor for hyperlipidemia without complaints of myalgias. She has a new complaint of numbness in both hands comes and goes but the pain in her hands can wake her up at night. She continues to go to water aerobics and she walks for exercise. She is independent in all activities of daily living and drives without difficulty. She is continuing to follow a diabetic diet. She returns for reevaluation.  Admission 07/03/17 - patient admitted for episode of dysarthria and left-sided weakness. MRI showed small left CTR and right ACA infarcts, likely due to A. fib with fluctuating INR on Coumadin. Repeat CTA head and neck no significant change, continued to show left ICA 50% stenosis with calcified plaques. Carotid Doppler unremarkable, EF 60-65%. LDL 45 and A1c 8.8. Her Coumadin changed to eliquis. And aspirin and Crestor continued.  Follow up 09/01/17 - the patient has been doing well. No recurrent stroke like symptoms. BP controlled well at home, today 134/73 in clinic. She did not check glucose at home, but recently put on Januvia, and close follow-up with PCP. She complains of bilateral hand and fingertips numbness, tingling and achy pain. She has an appointment with hand surgeon this Friday.  Interval History During the interval time, the patient has been doing well. No recurrent stroke. BP today 138/71, pt also stated BP and glucose in good  control at home. She followed with her hand surgeon and had right CTS surgery in 10/2017, after that her swollen and numbness and weakness of right hand improved, but now still has mild swollen and weakness at right hand. Denies pain or numbness at this time. Had PT to  improved left hand strength. She also has right hand CTS and may be considered for surgery in the future.   REVIEW OF SYSTEMS: Full 14 system review of systems performed and notable only for those listed below and in HPI above, all others are negative:  Constitutional:   Cardiovascular:  Ear/Nose/Throat:   Skin:  Eyes:   Respiratory:   Gastroitestinal:   Genitourinary:  Hematology/Lymphatic:   Endocrine:  Musculoskeletal:  Back pain, walking difficulty Allergy/Immunology:   Neurological:   Psychiatric:  Sleep:   The following represents the patient's updated allergies and side effects list: No Known Allergies  The neurologically relevant items on the patient's problem list were reviewed on today's visit.  Neurologic Examination  A problem focused neurological exam (12 or more points of the single system neurologic examination, vital signs counts as 1 point, cranial nerves count for 8 points) was performed.  Blood pressure 138/71, pulse 63, weight 146 lb (66.2 kg).  General - Well nourished, well developed, in no apparent distress.  Ophthalmologic - Sharp disc margins OU. .  Cardiovascular - Regular rate and rhythm, not in afib.  Mental Status -  Level of arousal and orientation to time, place, and person were intact. Language including expression, naming, repetition, comprehension was assessed and found intact. Fund of Knowledge was assessed and was intact.  Cranial Nerves II - XII - II - Visual field intact OU. III, IV, VI - Extraocular movements intact. V - Facial sensation intact bilaterally. VII - mild nasolabial fold flattening on the right. VIII - Hearing & vestibular intact bilaterally. X - Palate elevates symmetrically. XI - Chin turning & shoulder shrug intact bilaterally. XII - Tongue protrusion intact.  Motor Strength - The patient's strength was normal in all extremities except right hand grip 4-/5 and dexterity difficulty with hand mild to moderate  swelling and pronator drift was absent.  Bulk was normal and fasciculations were absent.   Motor Tone - Muscle tone was assessed at the neck and appendages and was normal.  Reflexes - The patient's reflexes were 1+ in all extremities and she had no pathological reflexes.  Sensory - Light touch, temperature/pinprick were assessed and were normal.    Coordination - The patient had normal movements in the hands with no ataxia or dysmetria.  Tremor was absent.  Gait and Station - slow and small stride, but steady.    Data reviewed: I personally reviewed the images and agree with the radiology interpretations.  Ct Angio Head and Neck W Or Wo Contrast 11/30/2016 1. No intracranial arterial occlusion or high-grade stenosis.  2. 50% narrowing of the proximal left internal carotid artery secondary to calcified atherosclerotic plaque.  3. Marked cervical degenerative disc disease with suspected mild-to-moderate spinal canal stenosis at the C4 and C5 levels.   Dg Chest Port 1 View 11/30/2016 1. Interval mild pulmonary vascular congestion and mild interstitial edema.  2. Stable dense left lower lobe atelectasis, pneumonia or aspiration pneumonitis.   Ct Head Code Stroke Wo Contrast 11/30/2016 1. No intracranial hemorrhage.  2. Left subinsular hypoattenuation is favored to be chronic, given preservation of overlying cortex. No other evidence of acute intracranial abnormality.  3. ASPECTS is 9.  TEE intra-op 11/28/2016 Left Ventricle LV systolic function is normal with an EF of 60-65%.    Septum Normal atrial and ventricular septum with no septal defect and normal septal motion.    Left Atrium Left atrium normal in structure and function.    Aortic Valve Structurally normal trileaflet aortic valve with no regurgitation or stenosis.    Aorta No aneurysm present. Plaque present in the descending aorta is mild (Grade 2: Mild-focal or diffuse int. thick. of 2-41mm). No graft present. No  significant coarctation present. No aortic dissection present    Mitral Valve Normal valve structure. Normal transmitral gradient. No stenosis. Normal leaflet mobility. No regurgitation.    Right Ventricle Normal cavity size, wall thickness and ejection fraction.    Right Atrium Normal right atrial size.    Tricuspid Valve Normal tricuspid valve structure. No stenosis. Trace regurgitation. The tricuspid valve regurgitation jet is central.    Pulmonic Valve Normal pulmonic valve structure. No stenosis. Trace regurgitation.    Pericardium Normal pericardium with no pericardial effusion.    Post-Op TEE AORTA Aorta unchanged from pre-bypass.  LEFT VENTRICLE Post-op, normal LV cavity size. Post-op, LV cavity is normal and ejection fraction is normal. Wall motion is normal.  RIGHT VENTRICLE Right ventricle unchanged from pre-bypass.  AORTIC VALVE Aortic valve unchanged from pre-bypass MITRAL VALVE Mitral valve unchanged from pre-bypass.  TRICUSPID VALVE Tricuspid valve unchanged from pre-bypass.     CT head repeat  12/01/2016 Unchanged examination without intracranial hemorrhage or CT evidence of acute infarct.  LE venous doppler - There is no evidence of deep or superficial vein thrombosis involving the right and left lower extremities. All visualized vessels appear patent and compressible. There is no evidence of Baker's cysts bilaterally.  Ct Angio Head W Or Wo Contrast Ct Angio Neck W Or Wo Contrast 07/03/2017 1. No emergent large vessel occlusion or intracranial stenosis.  2. Approximately 50% narrowing of the left skullbase ICA in the distal petrous and lacerum segments.  3. Mild bilateral carotid and Aortic Atherosclerosis (ICD10-I70.0) without dissection or significant stenosis.  4. Normal vertebral arteries.   Ct Cervical Spine Wo Contrast 07/03/2017 1. No acute fracture or static subluxation of the cervical spine.  2. Medium-sized disc extrusion at C3-C4, unchanged,  with mild to moderate spinal canal stenosis.   Mr Brain Wo Contrast 07/03/2017 1. Tiny RIGHT and parietal LEFT on subacute infarcts versus artifact.  2. Moderate chronic small vessel ischemic disease.   Ct Head Code Stroke Wo Contrast 07/03/2017 1. No acute finding by CT. Atrophy an extensive chronic small vessel ischemic changes throughout the cerebral hemispheres. 2. ASPECTS is 10.   Carotid ultrasound - Findings consistent with a 1-39 percent stenosis involving the right internal carotid artery and the left internal carotid artery. Bilateral vertebral arteries appear patent and antegrade. Difficult visualization od plaque due to acoustic shadowing.  Transthoracic echocardiogram 07/04/2017 Study Conclusions - Left ventricle: The cavity size was normal. There was mild concentric hypertrophy. Systolic function was normal. The estimated ejection fraction was in the range of 60% to 65%. Wall motion was normal; there were no regional wall motion abnormalities. - Aortic valve: Transvalvular velocity was within the normal range. There was no stenosis. There was no regurgitation. - Mitral valve: Transvalvular velocity was within the normal range. There was no evidence for stenosis. There was trivial regurgitation. - Right ventricle: The cavity size was normal. Wall thickness was normal. Systolic function was normal. - Atrial septum: No defect or patent foramen ovale was  identified by color flow Doppler. - Tricuspid valve: There was trivial regurgitation. Regurgitant peak velocity: 259 cm/s. - Pulmonary arteries: Systolic pressure was within the normal range. PA peak pressure: 30 mm Hg (S).  Component     Latest Ref Rng & Units 11/27/2016 11/28/2016 07/04/2017  Cholesterol     0 - 200 mg/dL 166  96  Triglycerides     <150 mg/dL 181 (H)  91  HDL Cholesterol     >40 mg/dL 41  33 (L)  Total CHOL/HDL Ratio     RATIO 4.0  2.9  VLDL     0 - 40 mg/dL 36  18  LDL  (calc)     0 - 99 mg/dL 89  45  Hemoglobin A1C     4.8 - 5.6 %  7.6 (H) 8.8 (H)  Mean Plasma Glucose     mg/dL  171 205.86    Assessment: As you may recall, she is a 77 y.o. African American female with PMH of HTN, HLD, afib on coumadin, DM, and CAD admitted on 11/25/16 for CABG, however developed right-sided weakness and aphasia post CABG procedure. MRI not able to do due to sternal wires, however concerning for left brain infarcts. CTA head and neck showed left ICA bifurcation 50% stenosis, bilateral cavernous ICA atherosclerosis L>R. CT repeat no acute infarct. Intraoperative TEE showed EF 60-65%. LE venous Doppler no DVT. LDL 89 and A1c 7.6. Hb 6.6 and received blood transfusion. Patient stroke considered procedure related or episodes of hypotension and anemia in the setting of carotid atherosclerosis. Coumadin resumed, INR goal 2-3. Aspirin and Crestor also continued. Re-admitted on 07/03/17 for episode of dysarthria and left-sided weakness. MRI showed small left frontal and right parietal punctated infarcts, likely due to A. fib with fluctuating INR on Coumadin. Repeat CTA head and neck no significant change, continued to show left ICA 50% stenosis with calcified plaques. Carotid Doppler unremarkable, EF 60-65%. LDL 45 and A1c 8.8. Her Coumadin changed to eliquis. And aspirin and Crestor continued. So far doing well. Complains of b/l hand numbness, tingling and achy pain, followed with hand surgeon and diagnosed with b/l CTS s/p right hand surgery but still has right hand weakness and swollen.  Plan:  - continue eliquis and ASA and crestor for stroke and cardiac prevention - check BP and glucose at home and record - follow up cardiology for afib - Follow up with your primary care physician for stroke risk factor modification. Recommend maintain blood pressure goal <130/80, diabetes with hemoglobin A1c goal below 7.0% and lipids with LDL cholesterol goal below 70 mg/dL.  - diabetic diet and regular  exercise  - follow up with hand surgeon for bilateral hand pain, and continue PT to improve hand strength.   - follow up as needed.  I spent more than 25 minutes of face to face time with the patient. Greater than 50% of time was spent in counseling and coordination of care. We discussed medication compliance, glucose monitoring, and bilateral CTS.   No orders of the defined types were placed in this encounter.   No orders of the defined types were placed in this encounter.   Patient Instructions  - continue eliquis and ASA and crestor for stroke and cardiac prevention - check BP and glucose at home and record - follow up cardiology for afib - Follow up with your primary care physician for stroke risk factor modification. Recommend maintain blood pressure goal <130/80, diabetes with hemoglobin A1c goal below 7.0% and  lipids with LDL cholesterol goal below 70 mg/dL.  - diabetic diet and regular exercise  - follow up with hand surgeon for bilateral hand pain, and continue PT to improve hand strength.   - follow up as needed.   Rosalin Hawking, MD PhD [facility] [address] [place], [place] [address] [phone]

## 2018-01-05 NOTE — Patient Instructions (Signed)
-   continue eliquis and ASA and crestor for stroke and cardiac prevention - check BP and glucose at home and record - follow up cardiology for afib - Follow up with your primary care physician for stroke risk factor modification. Recommend maintain blood pressure goal <130/80, diabetes with hemoglobin A1c goal below 7.0% and lipids with LDL cholesterol goal below 70 mg/dL.  - diabetic diet and regular exercise  - follow up with hand surgeon for bilateral hand pain, and continue PT to improve hand strength.   - follow up as needed.

## 2018-01-06 ENCOUNTER — Other Ambulatory Visit: Payer: Self-pay | Admitting: Cardiovascular Disease

## 2018-01-06 MED ORDER — CARTIA XT 240 MG PO CP24: 240.0000 mg | ORAL_CAPSULE | Freq: Every day | ORAL | 3 refills | Status: DC

## 2018-01-06 NOTE — Telephone Encounter (Signed)
Medication Detail    Disp Refills Start End   CARTIA XT 240 MG 24 hr capsule 90 capsule 3 01/06/2018    Sig - Route: Take 1 capsule (240 mg total) by mouth daily. - Oral   Sent to pharmacy as: CARTIA XT 240 MG 24 hr capsule   E-Prescribing Status: Receipt confirmed by pharmacy (01/06/2018 9:34 AM EST)   Pharmacy   [id number] - [place], [place] - [address]

## 2018-01-20 DIAGNOSIS — M79641 Pain in right hand: Secondary | ICD-10-CM | POA: Diagnosis not present

## 2018-01-20 DIAGNOSIS — H2511 Age-related nuclear cataract, right eye: Secondary | ICD-10-CM | POA: Diagnosis not present

## 2018-01-22 DIAGNOSIS — H2511 Age-related nuclear cataract, right eye: Secondary | ICD-10-CM | POA: Diagnosis not present

## 2018-02-05 DIAGNOSIS — G5601 Carpal tunnel syndrome, right upper limb: Secondary | ICD-10-CM | POA: Diagnosis not present

## 2018-02-11 DIAGNOSIS — I69328 Other speech and language deficits following cerebral infarction: Secondary | ICD-10-CM | POA: Diagnosis not present

## 2018-02-11 DIAGNOSIS — G5603 Carpal tunnel syndrome, bilateral upper limbs: Secondary | ICD-10-CM | POA: Diagnosis not present

## 2018-02-11 DIAGNOSIS — I119 Hypertensive heart disease without heart failure: Secondary | ICD-10-CM | POA: Diagnosis not present

## 2018-02-11 DIAGNOSIS — E119 Type 2 diabetes mellitus without complications: Secondary | ICD-10-CM | POA: Diagnosis not present

## 2018-02-11 DIAGNOSIS — I480 Paroxysmal atrial fibrillation: Secondary | ICD-10-CM | POA: Diagnosis not present

## 2018-02-19 DIAGNOSIS — N200 Calculus of kidney: Secondary | ICD-10-CM | POA: Diagnosis not present

## 2018-02-19 DIAGNOSIS — R8271 Bacteriuria: Secondary | ICD-10-CM | POA: Diagnosis not present

## 2018-03-06 ENCOUNTER — Other Ambulatory Visit: Payer: Self-pay | Admitting: Cardiovascular Disease

## 2018-03-09 DIAGNOSIS — G5602 Carpal tunnel syndrome, left upper limb: Secondary | ICD-10-CM | POA: Diagnosis not present

## 2018-03-09 DIAGNOSIS — G5601 Carpal tunnel syndrome, right upper limb: Secondary | ICD-10-CM | POA: Diagnosis not present

## 2018-03-27 ENCOUNTER — Emergency Department (HOSPITAL_COMMUNITY): Payer: Medicare Other

## 2018-03-27 ENCOUNTER — Encounter (HOSPITAL_COMMUNITY): Payer: Self-pay | Admitting: Emergency Medicine

## 2018-03-27 ENCOUNTER — Emergency Department (HOSPITAL_COMMUNITY)
Admission: EM | Admit: 2018-03-27 | Discharge: 2018-03-27 | Disposition: A | Discharge status: Home / Self Care | Payer: Medicare Other | Attending: Emergency Medicine | Admitting: Emergency Medicine

## 2018-03-27 DIAGNOSIS — Z7901 Long term (current) use of anticoagulants: Secondary | ICD-10-CM | POA: Diagnosis not present

## 2018-03-27 DIAGNOSIS — I2510 Atherosclerotic heart disease of native coronary artery without angina pectoris: Secondary | ICD-10-CM | POA: Insufficient documentation

## 2018-03-27 DIAGNOSIS — I129 Hypertensive chronic kidney disease with stage 1 through stage 4 chronic kidney disease, or unspecified chronic kidney disease: Secondary | ICD-10-CM | POA: Insufficient documentation

## 2018-03-27 DIAGNOSIS — Z79899 Other long term (current) drug therapy: Secondary | ICD-10-CM | POA: Insufficient documentation

## 2018-03-27 DIAGNOSIS — Z7984 Long term (current) use of oral hypoglycemic drugs: Secondary | ICD-10-CM | POA: Diagnosis not present

## 2018-03-27 DIAGNOSIS — Z8673 Personal history of transient ischemic attack (TIA), and cerebral infarction without residual deficits: Secondary | ICD-10-CM | POA: Diagnosis not present

## 2018-03-27 DIAGNOSIS — Z041 Encounter for examination and observation following transport accident: Secondary | ICD-10-CM | POA: Insufficient documentation

## 2018-03-27 DIAGNOSIS — S0990XA Unspecified injury of head, initial encounter: Secondary | ICD-10-CM | POA: Diagnosis not present

## 2018-03-27 DIAGNOSIS — Z043 Encounter for examination and observation following other accident: Secondary | ICD-10-CM | POA: Diagnosis not present

## 2018-03-27 DIAGNOSIS — M25512 Pain in left shoulder: Secondary | ICD-10-CM | POA: Diagnosis not present

## 2018-03-27 DIAGNOSIS — S4992XA Unspecified injury of left shoulder and upper arm, initial encounter: Secondary | ICD-10-CM | POA: Diagnosis not present

## 2018-03-27 DIAGNOSIS — E1122 Type 2 diabetes mellitus with diabetic chronic kidney disease: Secondary | ICD-10-CM | POA: Diagnosis not present

## 2018-03-27 DIAGNOSIS — Z7982 Long term (current) use of aspirin: Secondary | ICD-10-CM | POA: Insufficient documentation

## 2018-03-27 DIAGNOSIS — S299XXA Unspecified injury of thorax, initial encounter: Secondary | ICD-10-CM | POA: Diagnosis not present

## 2018-03-27 DIAGNOSIS — N183 Chronic kidney disease, stage 3 (moderate): Secondary | ICD-10-CM | POA: Diagnosis not present

## 2018-03-27 DIAGNOSIS — S0091XA Abrasion of unspecified part of head, initial encounter: Secondary | ICD-10-CM | POA: Diagnosis not present

## 2018-03-27 DIAGNOSIS — S098XXA Other specified injuries of head, initial encounter: Secondary | ICD-10-CM | POA: Diagnosis not present

## 2018-03-27 NOTE — ED Notes (Signed)
Pt haas voided x 2 on bedpan
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
## 2018-04-09 DIAGNOSIS — G5602 Carpal tunnel syndrome, left upper limb: Secondary | ICD-10-CM | POA: Diagnosis not present

## 2018-04-09 DIAGNOSIS — G5601 Carpal tunnel syndrome, right upper limb: Secondary | ICD-10-CM | POA: Diagnosis not present
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
## 2018-04-15 ENCOUNTER — Encounter: Payer: Self-pay | Admitting: Cardiovascular Disease

## 2018-04-15 ENCOUNTER — Ambulatory Visit: Payer: Medicare Other | Admitting: Cardiovascular Disease

## 2018-04-15 VITALS — BP 142/70 | HR 77 | Ht 64.0 in | Wt 147.0 lb

## 2018-04-15 DIAGNOSIS — I10 Essential (primary) hypertension: Secondary | ICD-10-CM | POA: Diagnosis not present

## 2018-04-15 DIAGNOSIS — I480 Paroxysmal atrial fibrillation: Secondary | ICD-10-CM

## 2018-04-15 DIAGNOSIS — E785 Hyperlipidemia, unspecified: Secondary | ICD-10-CM | POA: Diagnosis not present

## 2018-04-15 DIAGNOSIS — I25810 Atherosclerosis of coronary artery bypass graft(s) without angina pectoris: Secondary | ICD-10-CM

## 2018-04-15 DIAGNOSIS — I4891 Unspecified atrial fibrillation: Secondary | ICD-10-CM | POA: Diagnosis not present

## 2018-04-15 NOTE — Patient Instructions (Signed)

## 2018-04-28 DIAGNOSIS — H6523 Chronic serous otitis media, bilateral: Secondary | ICD-10-CM | POA: Diagnosis not present

## 2018-04-28 DIAGNOSIS — J310 Chronic rhinitis: Secondary | ICD-10-CM | POA: Diagnosis not present

## 2018-05-06 DIAGNOSIS — E119 Type 2 diabetes mellitus without complications: Secondary | ICD-10-CM | POA: Diagnosis not present

## 2018-05-06 DIAGNOSIS — I119 Hypertensive heart disease without heart failure: Secondary | ICD-10-CM | POA: Diagnosis not present

## 2018-05-06 DIAGNOSIS — I480 Paroxysmal atrial fibrillation: Secondary | ICD-10-CM | POA: Diagnosis not present

## 2018-05-06 DIAGNOSIS — I10 Essential (primary) hypertension: Secondary | ICD-10-CM | POA: Diagnosis not present

## 2018-05-06 DIAGNOSIS — I69328 Other speech and language deficits following cerebral infarction: Secondary | ICD-10-CM | POA: Diagnosis not present

## 2018-05-12 DIAGNOSIS — I6930 Unspecified sequelae of cerebral infarction: Secondary | ICD-10-CM | POA: Diagnosis not present

## 2018-05-12 DIAGNOSIS — I119 Hypertensive heart disease without heart failure: Secondary | ICD-10-CM | POA: Diagnosis not present

## 2018-05-12 DIAGNOSIS — I480 Paroxysmal atrial fibrillation: Secondary | ICD-10-CM | POA: Diagnosis not present

## 2018-05-12 DIAGNOSIS — E119 Type 2 diabetes mellitus without complications: Secondary | ICD-10-CM | POA: Diagnosis not present

## 2018-05-12 DIAGNOSIS — I10 Essential (primary) hypertension: Secondary | ICD-10-CM | POA: Diagnosis not present

## 2018-06-01 DIAGNOSIS — H348322 Tributary (branch) retinal vein occlusion, left eye, stable: Secondary | ICD-10-CM | POA: Diagnosis not present

## 2018-06-01 DIAGNOSIS — H35033 Hypertensive retinopathy, bilateral: Secondary | ICD-10-CM | POA: Diagnosis not present

## 2018-06-01 DIAGNOSIS — H353131 Nonexudative age-related macular degeneration, bilateral, early dry stage: Secondary | ICD-10-CM | POA: Diagnosis not present

## 2018-06-01 DIAGNOSIS — H401231 Low-tension glaucoma, bilateral, mild stage: Secondary | ICD-10-CM | POA: Diagnosis not present

## 2018-06-01 DIAGNOSIS — H35373 Puckering of macula, bilateral: Secondary | ICD-10-CM | POA: Diagnosis not present
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
## 2018-06-08 ENCOUNTER — Other Ambulatory Visit: Payer: Self-pay

## 2018-06-08 MED ORDER — CARVEDILOL 12.5 MG PO TABS: ORAL_TABLET | ORAL | 0 refills | Status: DC

## 2018-07-03 ENCOUNTER — Other Ambulatory Visit: Payer: Self-pay

## 2018-07-03 NOTE — Patient Outreach (Signed)
[facility]) Care Management  07/03/2018  AHNIKA HANNIBAL 1941/08/10 [id number]   Medication Adherence call to Mrs. Wauneta Silveria patient is due on Januavia 100 mg spoke with patient she is getting samples from the doctor's office because it is to expensive for her (copay is $120) and has other medication that are the same price.patient will be refer to Jennifer([facility]) for patient's Assistance. Mrs. Leary is showing past due under [place].   [facility] Management Direct Dial [phone]  Fax [phone] [email or web address]

## 2018-07-06 ENCOUNTER — Other Ambulatory Visit: Payer: Self-pay | Admitting: Pharmacy Technician

## 2018-07-06 ENCOUNTER — Other Ambulatory Visit: Payer: Self-pay

## 2018-07-06 NOTE — Patient Outreach (Signed)
[facility]) Care Management  07/06/2018  Audrey Mullen 04-Dec-1940 [id number]   Received [employer] patient assistance referral from [facility] [place] for [place]. Prepared patient portion to be mailed out and provider portion to be mailed to Dr. Criss Rosales.  Will follow up with patient in 5-7 business days to confirm application has been received.  [name] [place], [place] Management [phone]

## 2018-07-06 NOTE — Patient Outreach (Signed)
[facility]) Care Management  07/06/2018  Audrey Mullen 09/19/1941 [id number]  77 year old female outreached by [place] for an adherence call.  [place] services requested for medication assistance for Januvia and Eliquis.  PMHx includes, but not limited to, atrial fibrillation, hypertension, h/o stroke, coronary artery disease, Type 2 diabetes mellitus and hyperlipidemia.  Successful outreach to Audrey Mullen.  HIPAA identifiers verified.   Medication Assistance: Per financial discussion, Audrey Mullen does not qualify for Extra Help LIS because she owns real estate other than her residence.  She states that she is having trouble affording Januvia and Eliquis.  Patient may be eligible for Januvia patient assistance, as [employer] does not require an out of pocket (OOP) expenditure to be apply.  Patient states that will get her OOP from her pharmacy tomorrow to determine if she has spent 3% of income as required for Eliquis assistance from [employer].  Plan: Route letter to CPhT, Etter Sjogren to begin Januvia patient assistance.  Outreach to patient 8/21 to determine if she has met OOP to apply for Eliquis assistance.   Joetta Manners, PharmD Clinical Pharmacist [place] [phone]

## 2018-07-08 ENCOUNTER — Other Ambulatory Visit: Payer: Self-pay | Admitting: Pharmacy Technician

## 2018-07-08 ENCOUNTER — Other Ambulatory Visit: Payer: Self-pay

## 2018-07-08 ENCOUNTER — Ambulatory Visit: Payer: Self-pay

## 2018-07-08 NOTE — Patient Outreach (Signed)
[facility]) Care Management  07/08/2018  Audrey Mullen Jun 07, 1941 [id number]  Successful outreach attempt to [name].  HIPAA identifiers verified.   Medication Assistance: [name] reports that she got her pharmacy printout, which shows a TROOP of $ 1014.88.  Based on this amount and her income, she may be eligible for Eliquis patient assistance from [employer].  Plan: Route letter to [facility] CPhT, Etter Sjogren to begin patient assistance.   Joetta Manners, PharmD Clinical Pharmacist [place] [phone]

## 2018-07-08 NOTE — Patient Outreach (Signed)
[facility]) Care Management  07/09/2018  Audrey Mullen 04-13-1941 [id number]   Received [place] patient assistance referral from [place] for Eliquis. Prepared patient portion to be mailed and faxed provider portion to Dr. Criss Rosales.  Will follow up with patient in 5-7 business days to confirm application has been received.  [name] [place], [place] Management [phone]

## 2018-07-23 ENCOUNTER — Other Ambulatory Visit: Payer: Self-pay | Admitting: Pharmacy Technician

## 2018-07-23 NOTE — Patient Outreach (Signed)
[facility]) Care Management  07/23/2018  Dondra Rhett [facility] [birth year] [id number]   Received patients portion of applications for Januvia and Eliquis. Prepared Januvia application to mailed out to [employer]. Faxed completed Eliquis application into [facility].  Will follow up with both companies in the next 7-14 business days.  [name] [place], [place] Management [phone]

## 2018-07-27 ENCOUNTER — Other Ambulatory Visit: Payer: Self-pay | Admitting: Cardiovascular Disease

## 2018-07-30 ENCOUNTER — Other Ambulatory Visit: Payer: Self-pay | Admitting: Pharmacy Technician

## 2018-07-30 NOTE — Patient Outreach (Signed)
[facility]) Care Management  07/31/2018  KYLEEN VILLATORO July 17, 1941 [id number]   Follow up call to [place] to check status of patients application for Eliquis. Kristeen Miss confirmed patient has been approved from 9/10 until 11/17/2018 and patient should received medication in 7-10 business days.  Will follow up with patient in 10-14 business days to confirm medication has been received.  [name] [place], [place] Management [phone]

## 2018-08-05 ENCOUNTER — Other Ambulatory Visit: Payer: Self-pay | Admitting: Pharmacy Technician

## 2018-08-05 NOTE — Patient Outreach (Addendum)
[facility]) Care Management  08/05/2018  CHIYO FAY 08-20-1941 [id number]   Unsuccessful outreach call to patient, left HIPAA compliant message with gentleman that answered phone.  Calling to inform patient that [employer] mailed out attestation letter to her on 9/13.  If call not returned, will follow up with patient in 2-3 business days.  [name] [place], [place] Management [phone]    ADDENDUM 4:20pm  Incoming call from patient, HIPAA identifiers verified. Informed patient that [employer] mailed out attestation letter to her on 9/13. Requested that she contact me once letter has been received so that I can help her fill out and mail back correctly.  Will follow up with patient in 5-7 business days, if I have not received a call back.  [name] [place], [place] Management [phone]

## 2018-08-21 ENCOUNTER — Other Ambulatory Visit: Payer: Self-pay | Admitting: Pharmacy Technician

## 2018-08-21 NOTE — Patient Outreach (Signed)
[facility] [facility]) Care Management  08/21/2018  Audrey Mullen 10-Dec-1940 [id number]   Patient contacted me back on 9/21 about received the [employer]. Helped patient fill out required items and informed her of what all needed to be mailed back to [employer]. Patient stated she would place in the mail that same day.  Will follow up with patient in 3-5 business days to see if medication has been received.  [name] [place], [place] Management [phone]

## 2018-08-26 ENCOUNTER — Other Ambulatory Visit: Payer: Self-pay | Admitting: Pharmacy Technician

## 2018-08-26 NOTE — Patient Outreach (Signed)
[facility]) Care Management  08/26/2018  Audrey Mullen [facility] [birth year] [id number]   Successful call to [name], HIPAA identifiers verified. She confirmed that she received her Januvia from [employer] patient assistance. Patient received 3 month supply from both companies she has been approved for. We discussed that patient would need to reapply next year for both programs.  Will route note to [place] for case closure.  [name] [place], [place] Management [phone]

## 2018-08-27 ENCOUNTER — Other Ambulatory Visit: Payer: Self-pay

## 2018-08-27 NOTE — Patient Outreach (Signed)
[facility]) Care Management  08/27/2018  Audrey Mullen [facility] [birth year] [id number]  Audrey Mullen reports that she has received her patient assistance medications.   Will close her [facility] case at this time.   Plan: Route case closure letter to PCP, Audrey Mullen.  Audrey Mullen, PharmD Clinical Pharmacist [place] [phone]

## 2018-08-31 DIAGNOSIS — E119 Type 2 diabetes mellitus without complications: Secondary | ICD-10-CM | POA: Diagnosis not present

## 2018-08-31 DIAGNOSIS — I10 Essential (primary) hypertension: Secondary | ICD-10-CM | POA: Diagnosis not present

## 2018-08-31 DIAGNOSIS — I119 Hypertensive heart disease without heart failure: Secondary | ICD-10-CM | POA: Diagnosis not present

## 2018-09-02 DIAGNOSIS — H16223 Keratoconjunctivitis sicca, not specified as Sjogren's, bilateral: Secondary | ICD-10-CM | POA: Diagnosis not present

## 2018-09-02 DIAGNOSIS — H16143 Punctate keratitis, bilateral: Secondary | ICD-10-CM | POA: Diagnosis not present

## 2018-09-02 DIAGNOSIS — H0102A Squamous blepharitis right eye, upper and lower eyelids: Secondary | ICD-10-CM | POA: Diagnosis not present

## 2018-09-02 DIAGNOSIS — H353131 Nonexudative age-related macular degeneration, bilateral, early dry stage: Secondary | ICD-10-CM | POA: Diagnosis not present

## 2018-09-03 ENCOUNTER — Ambulatory Visit
Admission: RE | Admit: 2018-09-03 | Discharge: 2018-09-03 | Disposition: A | Discharge status: Home / Self Care | Payer: Medicare Other | Source: Ambulatory Visit | Attending: Family Medicine | Admitting: Family Medicine

## 2018-09-03 ENCOUNTER — Other Ambulatory Visit: Payer: Self-pay | Admitting: Family Medicine

## 2018-09-03 DIAGNOSIS — M19071 Primary osteoarthritis, right ankle and foot: Secondary | ICD-10-CM | POA: Diagnosis not present

## 2018-09-03 DIAGNOSIS — R52 Pain, unspecified: Secondary | ICD-10-CM

## 2018-09-03 DIAGNOSIS — M19072 Primary osteoarthritis, left ankle and foot: Secondary | ICD-10-CM | POA: Diagnosis not present

## 2018-09-03 DIAGNOSIS — Z0000 Encounter for general adult medical examination without abnormal findings: Secondary | ICD-10-CM | POA: Diagnosis not present

## 2018-09-03 DIAGNOSIS — M130 Polyarthritis, unspecified: Secondary | ICD-10-CM | POA: Diagnosis not present

## 2018-09-03 DIAGNOSIS — I10 Essential (primary) hypertension: Secondary | ICD-10-CM | POA: Diagnosis not present

## 2018-09-03 DIAGNOSIS — I6930 Unspecified sequelae of cerebral infarction: Secondary | ICD-10-CM | POA: Diagnosis not present

## 2018-09-10 DIAGNOSIS — H6521 Chronic serous otitis media, right ear: Secondary | ICD-10-CM | POA: Diagnosis not present

## 2018-09-30 DIAGNOSIS — I6930 Unspecified sequelae of cerebral infarction: Secondary | ICD-10-CM | POA: Diagnosis not present

## 2018-09-30 DIAGNOSIS — I10 Essential (primary) hypertension: Secondary | ICD-10-CM | POA: Diagnosis not present

## 2018-09-30 DIAGNOSIS — M130 Polyarthritis, unspecified: Secondary | ICD-10-CM | POA: Diagnosis not present

## 2018-10-07 DIAGNOSIS — E785 Hyperlipidemia, unspecified: Secondary | ICD-10-CM | POA: Diagnosis not present

## 2018-10-07 DIAGNOSIS — I10 Essential (primary) hypertension: Secondary | ICD-10-CM | POA: Diagnosis not present

## 2018-10-07 DIAGNOSIS — E1100 Type 2 diabetes mellitus with hyperosmolarity without nonketotic hyperglycemic-hyperosmolar coma (NKHHC): Secondary | ICD-10-CM | POA: Diagnosis not present

## 2018-10-12 ENCOUNTER — Other Ambulatory Visit: Payer: Self-pay | Admitting: Pharmacy Technician

## 2018-10-12 ENCOUNTER — Other Ambulatory Visit: Payer: Self-pay

## 2018-10-12 NOTE — Patient Outreach (Signed)
[facility]) Care Management  10/12/2018  Audrey Mullen March 01, 1941 [id number]   Informed by [facility] RPh Joetta Manners that patient wants to re-apply for [name]) patient assistance for 2020. Prepared patient portion of application to be mailed and provider portion to be inter-officed to Dr. Criss Rosales.  Will follow up with patient in 7-10 business days (due to holiday) to confirm application has been received.  [name] Chana Bode [place] Certified Pharmacy Technician [place] Management Direct Dial:[phone]

## 2018-10-12 NOTE — Patient Outreach (Signed)
[facility]) Care Management  10/12/2018  Audrey Mullen [facility] [birth year] [id number]  Successful outreach to [name] and HIPAA identifiers verified.    Patient confirms that she is still taking Januvia.  Informed her that [place] is going to send her a patient assistance application for 0211.   Requested that she call with any questions once she receives the application.  Plan: Route note to [place], Etter Sjogren.   Joetta Manners, PharmD Clinical Pharmacist [place] [phone]

## 2018-10-26 ENCOUNTER — Ambulatory Visit: Payer: Self-pay | Admitting: Pharmacy Technician

## 2018-10-28 DIAGNOSIS — I10 Essential (primary) hypertension: Secondary | ICD-10-CM | POA: Diagnosis not present

## 2018-10-28 DIAGNOSIS — I119 Hypertensive heart disease without heart failure: Secondary | ICD-10-CM | POA: Diagnosis not present

## 2018-10-28 DIAGNOSIS — E0800 Diabetes mellitus due to underlying condition with hyperosmolarity without nonketotic hyperglycemic-hyperosmolar coma (NKHHC): Secondary | ICD-10-CM | POA: Diagnosis not present

## 2018-10-28 DIAGNOSIS — E785 Hyperlipidemia, unspecified: Secondary | ICD-10-CM | POA: Diagnosis not present

## 2018-10-28 DIAGNOSIS — N390 Urinary tract infection, site not specified: Secondary | ICD-10-CM | POA: Diagnosis not present

## 2018-10-28 DIAGNOSIS — I6340 Cerebral infarction due to embolism of unspecified cerebral artery: Secondary | ICD-10-CM | POA: Diagnosis not present

## 2018-11-02 ENCOUNTER — Other Ambulatory Visit: Payer: Self-pay | Admitting: Pharmacy Technician

## 2018-11-02 NOTE — Patient Outreach (Signed)
[place] [facility]) Care Management  11/02/2018  Audrey Mullen Feb 19, 1941 [id number]    Received provider portion of [employer] application for [employer]. Prepared completed application and required documents to be mailed to company.  Will follow up with [employer] in 10-14 business days (not including holidays) to confirm application has been received.  [name] Chana Bode [place] Certified Pharmacy Technician [place] Management Direct Dial:[phone]

## 2018-11-10 IMAGING — CR DG ANKLE COMPLETE 3+V*R*
3 series · 3 of 3 positions shown · non-contrast
Comparison: None.

CLINICAL DATA: 77-year-old female with bilateral ankle pain and
swelling for the past 2-3 weeks. No known injury. Symptoms worse on
the left. Initial encounter.

EXAM:
RIGHT ANKLE - COMPLETE 3+ VIEW

[t ankle joint ap right]
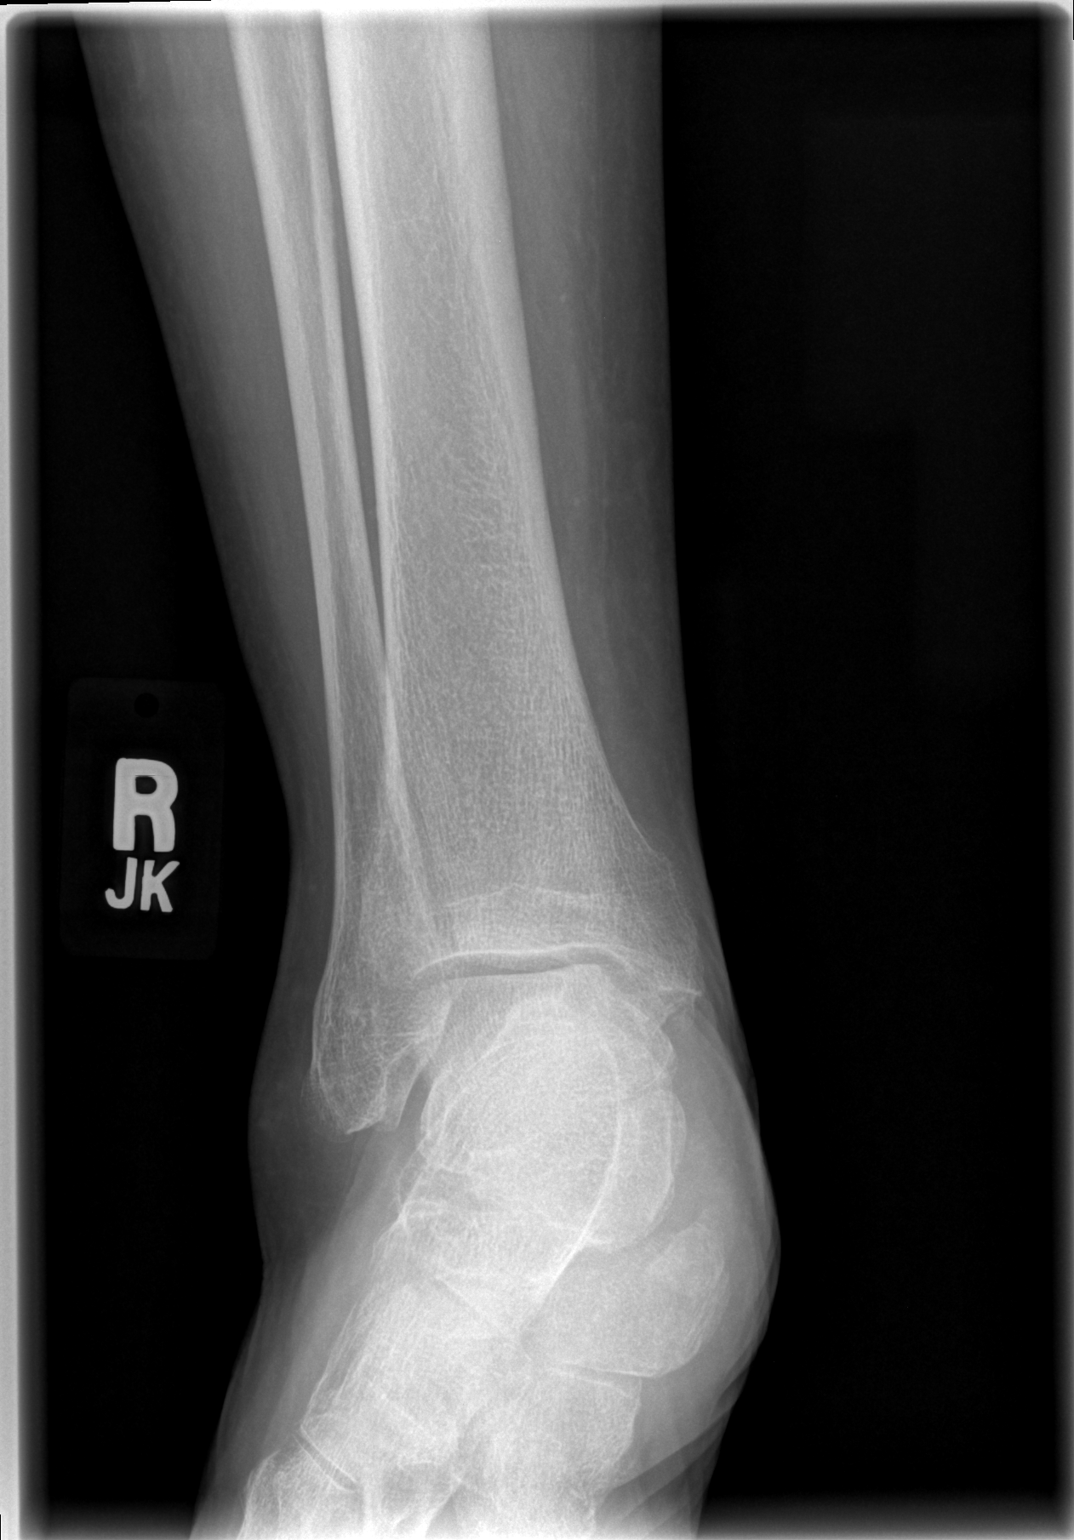

[t ankle joint oblique right]
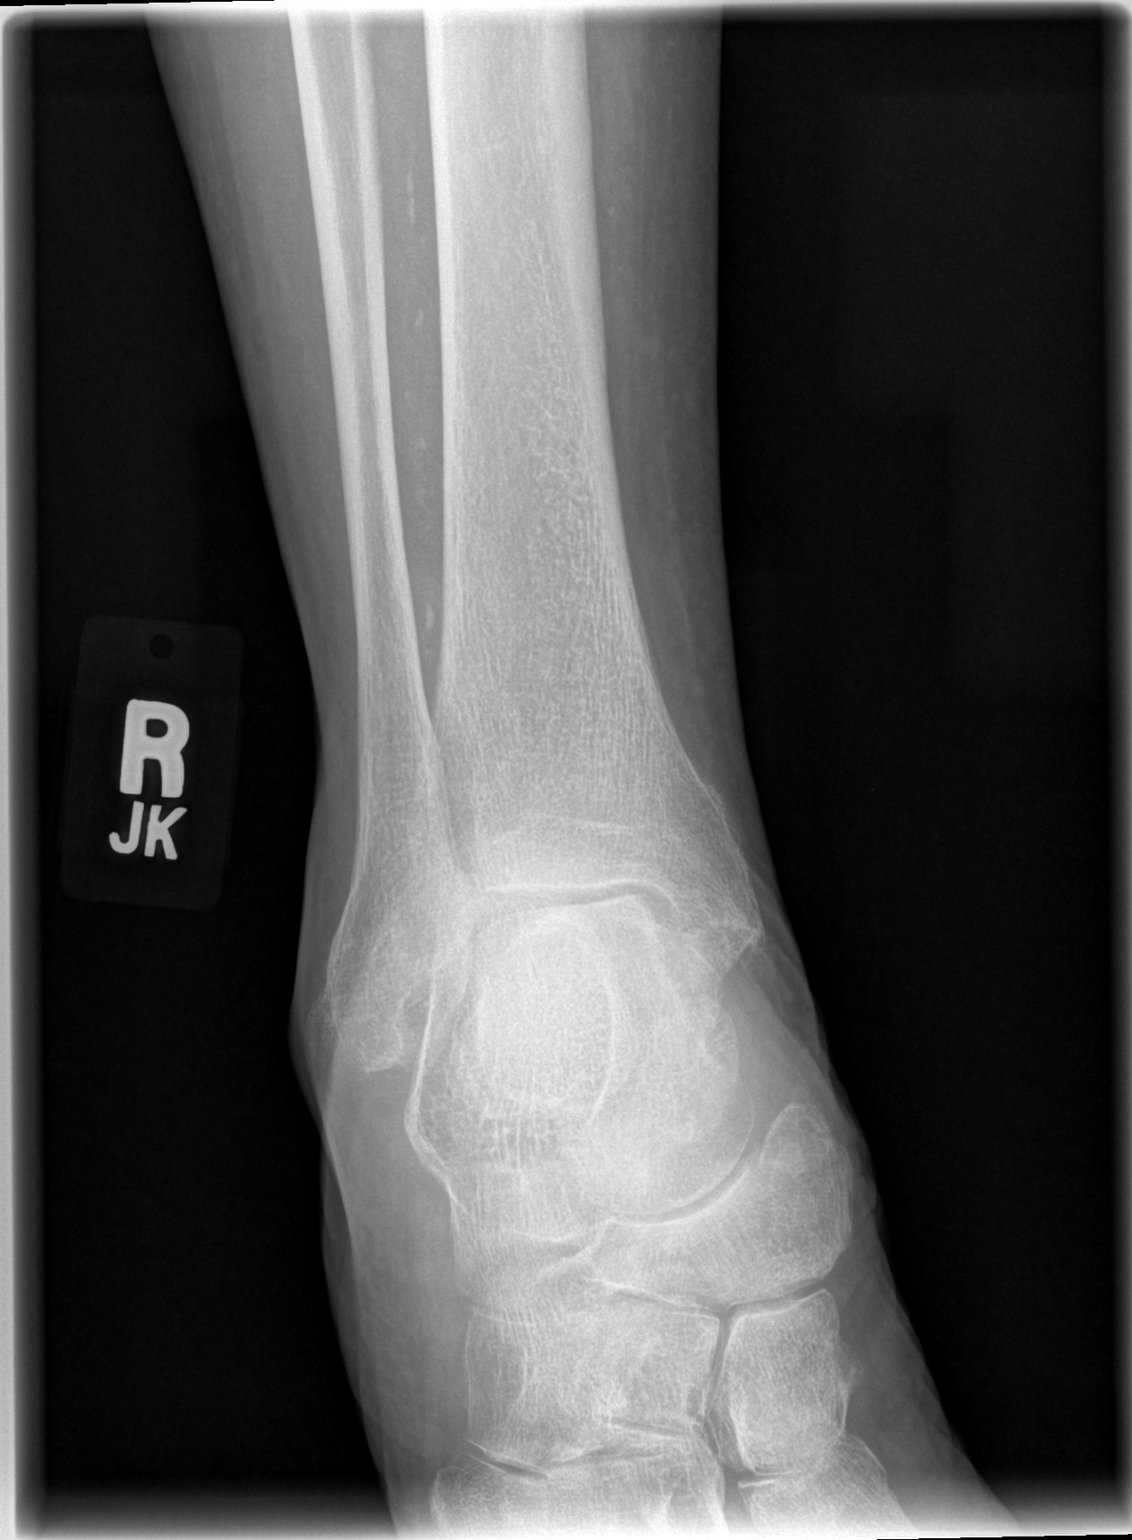

[t ankle joint lat right]
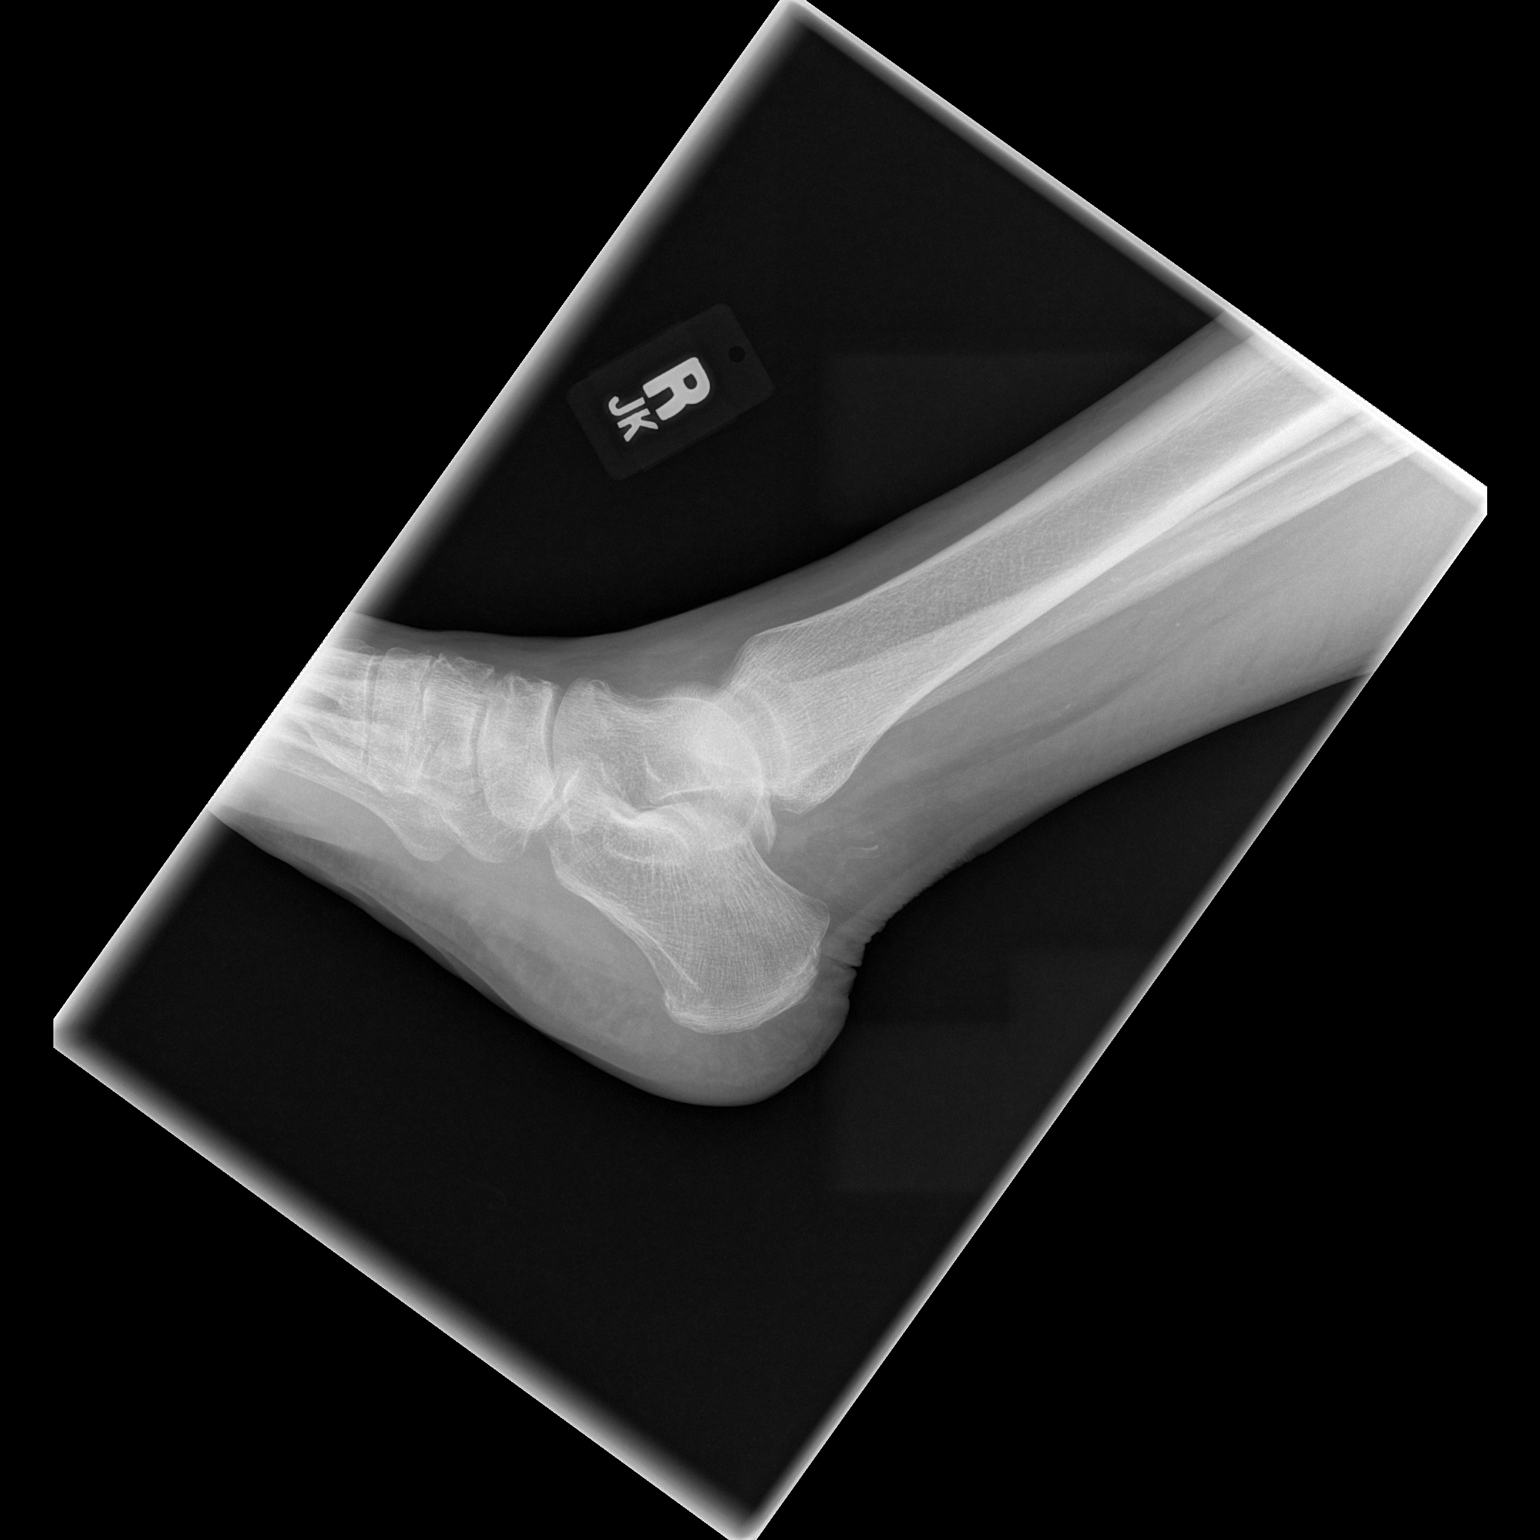

[3 of 3 positions shown; findings below may reference images not displayed]

FINDINGS: No fracture or dislocation. Mild narrowing medial aspect of the
tibial talar joint space. Minimal degenerative changes talonavicular
articulation. Mild degenerative changes medial cuneiform-first
metatarsal articulation. Soft tissue prominence adjacent to the
lateral malleolus possibly normal for this patient versus mild soft
tissue swelling. Vascular calcifications.
IMPRESSION: 1. No fracture or dislocation.
2. Mild narrowing medial aspect of the tibial talar joint space.
3. Minimal degenerative changes talonavicular articulation.
4. Mild degenerative changes medial cuneiform-first metatarsal
articulation.
5. Soft tissue prominence adjacent to the lateral malleolus possibly
normal for this patient versus mild soft tissue swelling.

## 2018-11-17 DIAGNOSIS — N390 Urinary tract infection, site not specified: Secondary | ICD-10-CM | POA: Diagnosis not present

## 2018-11-24 DIAGNOSIS — H0102B Squamous blepharitis left eye, upper and lower eyelids: Secondary | ICD-10-CM | POA: Diagnosis not present

## 2018-11-24 DIAGNOSIS — H401231 Low-tension glaucoma, bilateral, mild stage: Secondary | ICD-10-CM | POA: Diagnosis not present

## 2018-11-24 DIAGNOSIS — H0102A Squamous blepharitis right eye, upper and lower eyelids: Secondary | ICD-10-CM | POA: Diagnosis not present

## 2018-11-24 DIAGNOSIS — H35033 Hypertensive retinopathy, bilateral: Secondary | ICD-10-CM | POA: Diagnosis not present

## 2018-11-24 DIAGNOSIS — H353131 Nonexudative age-related macular degeneration, bilateral, early dry stage: Secondary | ICD-10-CM | POA: Diagnosis not present

## 2018-11-26 DIAGNOSIS — E1100 Type 2 diabetes mellitus with hyperosmolarity without nonketotic hyperglycemic-hyperosmolar coma (NKHHC): Secondary | ICD-10-CM | POA: Diagnosis not present

## 2018-11-26 DIAGNOSIS — I4891 Unspecified atrial fibrillation: Secondary | ICD-10-CM | POA: Diagnosis not present

## 2018-11-26 DIAGNOSIS — I6340 Cerebral infarction due to embolism of unspecified cerebral artery: Secondary | ICD-10-CM | POA: Diagnosis not present

## 2018-11-27 DIAGNOSIS — R8271 Bacteriuria: Secondary | ICD-10-CM | POA: Diagnosis not present

## 2018-11-27 DIAGNOSIS — N8111 Cystocele, midline: Secondary | ICD-10-CM | POA: Diagnosis not present

## 2018-11-27 DIAGNOSIS — N952 Postmenopausal atrophic vaginitis: Secondary | ICD-10-CM | POA: Diagnosis not present

## 2018-11-27 DIAGNOSIS — R3121 Asymptomatic microscopic hematuria: Secondary | ICD-10-CM | POA: Diagnosis not present

## 2018-12-03 ENCOUNTER — Other Ambulatory Visit: Payer: Self-pay | Admitting: Pharmacy Technician

## 2018-12-03 NOTE — Patient Outreach (Signed)
[facility]) Care Management  12/03/2018  SHAKIYAH CIRILO 1941/08/09 [id number]    Follow up call placed to [employer] regarding patient assistance application(s) for Fleeta Emmer confirms application was received and attestation form was mailed out to patient on 1/13.     Unsuccessful call #1 placed to patient regarding patient assistance update for Januvia, HIPAA compliant voicemail left.   Will make 2nd call attempt in 2-3 business days if call has not been returned.   [name] Chana Bode [place] Certified Pharmacy Technician [place] Management Direct [phone]

## 2018-12-04 DIAGNOSIS — N952 Postmenopausal atrophic vaginitis: Secondary | ICD-10-CM | POA: Diagnosis not present

## 2018-12-04 DIAGNOSIS — N8111 Cystocele, midline: Secondary | ICD-10-CM | POA: Diagnosis not present

## 2018-12-04 DIAGNOSIS — R8271 Bacteriuria: Secondary | ICD-10-CM | POA: Diagnosis not present

## 2018-12-07 ENCOUNTER — Ambulatory Visit: Payer: Medicare Other | Admitting: Pharmacy Technician

## 2018-12-09 DIAGNOSIS — N390 Urinary tract infection, site not specified: Secondary | ICD-10-CM | POA: Diagnosis not present

## 2018-12-09 DIAGNOSIS — E785 Hyperlipidemia, unspecified: Secondary | ICD-10-CM | POA: Diagnosis not present

## 2018-12-09 DIAGNOSIS — I119 Hypertensive heart disease without heart failure: Secondary | ICD-10-CM | POA: Diagnosis not present

## 2018-12-09 DIAGNOSIS — E782 Mixed hyperlipidemia: Secondary | ICD-10-CM | POA: Diagnosis not present

## 2018-12-11 ENCOUNTER — Other Ambulatory Visit: Payer: Self-pay | Admitting: Pharmacy Technician

## 2018-12-11 NOTE — Patient Outreach (Signed)
[place] [facility]) Care Management  12/11/2018  TRISSA MOLINA [birth year] [id number]   Incoming call from patient stating she received attestation form from [employer]ck patient assistance. Assisted patient with filling form out.  Will follow up with [employer] in 7-10 business days to confirm attestation form has been received.  [name] Chana Bode [place] Certified Pharmacy Technician [place] Management Direct Dial:[phone]

## 2018-12-25 ENCOUNTER — Other Ambulatory Visit: Payer: Self-pay | Admitting: Pharmacy Technician

## 2018-12-25 NOTE — Patient Outreach (Addendum)
[facility]) Care Management  12/25/2018  Audrey Mullen 07-08-1941 [id number]    Follow up call placed to [employer] regarding patient assistance application(s) for [name] confirms that attestation form had been received and patient has been approved 12/15/18 until 11/18/19  . He stats the medication has already been shipped and is expected to be delivered no later than 2/11. He also provided with USPS tracking information.  Patient had contacted me this morning stating that she is running on medication.   Unsuccessful call #1 placed to patient regarding patient assistance update for Januvia, HIPAA compliant voicemail left.   Will make 2nd call attempt in 1-2 business days to inform of delivery information if call has not been returned.  [name] Chana Bode [place] Certified Pharmacy Technician [place] Management Direct [phone]   ADDENDUM 2:10pm  Return call from patient, HIPAA identifiers verified. Audrey Mullen states she has 4 days of medication left.   Will track medicine thru [place] on Monday to verify medication will arrive before she runs out.  [name] Chana Bode [place] Certified Pharmacy Technician [place] Management Direct [phone]

## 2018-12-26 ENCOUNTER — Other Ambulatory Visit: Payer: Self-pay | Admitting: Cardiovascular Disease

## 2018-12-28 ENCOUNTER — Telehealth: Payer: Self-pay | Admitting: Cardiovascular Disease

## 2018-12-28 ENCOUNTER — Other Ambulatory Visit: Payer: Self-pay | Admitting: Nurse Practitioner

## 2018-12-28 MED ORDER — DILTIAZEM HCL ER COATED BEADS 240 MG PO CP24: 240.0000 mg | ORAL_CAPSULE | Freq: Every day | ORAL | 1 refills | Status: DC

## 2018-12-28 NOTE — Telephone Encounter (Signed)
Ok to call in CD

## 2018-12-28 NOTE — Telephone Encounter (Signed)
Pt's pharmacy [facility] is requesting a refill on Diltiazem CD 240 mg capsules 24 hr. Pt was prescribed cartia XT 240 mg 24 hr capsules. Pt has been taking Diltiazem CD 240 mg capsules in the past. Please address

## 2018-12-28 NOTE — Telephone Encounter (Signed)
Pt's medication was reorder and sent to pt's pharmacy per Dr Johnsie Cancel. Confirmation received.

## 2018-12-29 ENCOUNTER — Other Ambulatory Visit: Payer: Self-pay | Admitting: Pharmacy Technician

## 2018-12-30 NOTE — Patient Outreach (Signed)
[facility]) Care Management  12/30/2018  Audrey Mullen 04/19/1941 [id number]   Spoke to patient on 2/11 about tracking her Januvia thru [place]. Informed her that website had not been updated. She stated that she had not been out to check her mail to see if her Januvia was in there. She also stated that if he had been delivered by the afternoon of 2/12 that she would contact her provider for samples.  Incoming call from patient to day stating that she received her Januvia today. Reviewed with patient how to obtain refills and requested that she contact me if she runs into any issues.  Will remove myself from care team now that Patient assistance process has been completed for patients Januvia.  [name] Chana Bode [place] Certified Pharmacy Technician [place] Management Direct Dial:[phone]

## 2019-01-15 DIAGNOSIS — N8111 Cystocele, midline: Secondary | ICD-10-CM | POA: Diagnosis not present

## 2019-01-15 DIAGNOSIS — N952 Postmenopausal atrophic vaginitis: Secondary | ICD-10-CM | POA: Diagnosis not present

## 2019-01-20 DIAGNOSIS — I10 Essential (primary) hypertension: Secondary | ICD-10-CM | POA: Diagnosis not present

## 2019-01-20 DIAGNOSIS — E1169 Type 2 diabetes mellitus with other specified complication: Secondary | ICD-10-CM | POA: Diagnosis not present

## 2019-01-20 DIAGNOSIS — N390 Urinary tract infection, site not specified: Secondary | ICD-10-CM | POA: Diagnosis not present

## 2019-02-07 ENCOUNTER — Other Ambulatory Visit: Payer: Self-pay | Admitting: Cardiovascular Disease

## 2019-02-24 DIAGNOSIS — E782 Mixed hyperlipidemia: Secondary | ICD-10-CM | POA: Diagnosis not present

## 2019-02-24 DIAGNOSIS — I6930 Unspecified sequelae of cerebral infarction: Secondary | ICD-10-CM | POA: Diagnosis not present

## 2019-02-24 DIAGNOSIS — E1169 Type 2 diabetes mellitus with other specified complication: Secondary | ICD-10-CM | POA: Diagnosis not present

## 2019-02-24 DIAGNOSIS — M130 Polyarthritis, unspecified: Secondary | ICD-10-CM | POA: Diagnosis not present

## 2019-02-24 DIAGNOSIS — I119 Hypertensive heart disease without heart failure: Secondary | ICD-10-CM | POA: Diagnosis not present

## 2019-02-24 DIAGNOSIS — I509 Heart failure, unspecified: Secondary | ICD-10-CM | POA: Diagnosis not present

## 2019-03-16 DIAGNOSIS — R8271 Bacteriuria: Secondary | ICD-10-CM | POA: Diagnosis not present

## 2019-03-16 DIAGNOSIS — N8111 Cystocele, midline: Secondary | ICD-10-CM | POA: Diagnosis not present

## 2019-03-16 DIAGNOSIS — N952 Postmenopausal atrophic vaginitis: Secondary | ICD-10-CM | POA: Diagnosis not present

## 2019-03-26 DIAGNOSIS — I10 Essential (primary) hypertension: Secondary | ICD-10-CM | POA: Diagnosis not present

## 2019-03-31 ENCOUNTER — Telehealth: Payer: Self-pay | Admitting: Cardiovascular Disease

## 2019-04-07 NOTE — Telephone Encounter (Signed)
New message      Called to convert 04-20-19 ov to a virtual visit.  Patient gave consent for phone visit.  She does not know how to get text messages off of her cell.  Patient gave consent for visit.  YOUR CARDIOLOGY TEAM HAS ARRANGED FOR AN E-VISIT FOR YOUR APPOINTMENT - PLEASE REVIEW IMPORTANT INFORMATION BELOW SEVERAL DAYS PRIOR TO YOUR APPOINTMENT  Due to the recent COVID-19 pandemic, we are transitioning in-person office visits to tele-medicine visits in an effort to decrease unnecessary exposure to our patients, their families, and staff. These visits are billed to your insurance just like a normal visit is. We also encourage you to sign up for MyChart if you have not already done so. You will need a smartphone if possible. For patients that do not have this, we can still complete the visit using a regular telephone but do prefer a smartphone to enable video when possible. You may have a family member that lives with you that can help. If possible, we also ask that you have a blood pressure cuff and scale at home to measure your blood pressure, heart rate and weight prior to your scheduled appointment. Patients with clinical needs that need an in-person evaluation and testing will still be able to come to the office if absolutely necessary. If you have any questions, feel free to call our office.     YOUR PROVIDER WILL BE USING THE FOLLOWING PLATFORM TO COMPLETE YOUR VISIT:   Phone   IF USING MYCHART - How to Download the MyChart App to Your SmartPhone   - If Apple, go to [employer] and type in MyChart in the search bar and download the app. If Android, ask patient to go to [employer] and type in [place] in the search bar and download the app. The app is free but as with any other app downloads, your phone may require you to verify saved payment information or Apple/Android password.  - You will need to then log into the app with your MyChart username and password, and select [facility] as your healthcare provider to link the account.  - When it is time for your visit, go to the MyChart app, find appointments, and click Begin Video Visit. Be sure to Select Allow for your device to access the Microphone and Camera for your visit. You will then be connected, and your provider will be with you shortly.  **If you have any issues connecting or need assistance, please contact MyChart service desk [phone])**  **If using a computer, in order to ensure the best quality for your visit, you will need to use either of the following Internet Browsers: Insurance underwriter or Microsoft Edge**   IF USING DOXIMITY or [email or web address] - The staff will give you instructions on receiving your link to join the meeting the day of your visit.      2-3 DAYS BEFORE YOUR APPOINTMENT  You will receive a telephone call from one of our [place] team members - your caller ID may say "Unknown caller." If this is a video visit, we will walk you through how to get the video launched on your phone. We will remind you check your blood pressure, heart rate and weight prior to your scheduled appointment. If you have an Apple Watch or K[place]ia, please upload any pertinent ECG strips the day before or morning of your appointment to [place]. Our staff will also make sure you have reviewed the consent and agree  to move forward with your scheduled tele-health visit.     THE DAY OF YOUR APPOINTMENT  Approximately 15 minutes prior to your scheduled appointment, you will receive a telephone call from one of [place] team - your caller ID may say "Unknown caller."  Our staff will confirm medications, vital signs for the day and any symptoms you may be experiencing. Please have this information available prior to the time of visit start. It may also be helpful for you to have a pad of paper and pen handy for any instructions given during your visit. They will also walk you through joining the smartphone meeting if  this is a video visit.    CONSENT FOR TELE-HEALTH VISIT - PLEASE REVIEW  I hereby voluntarily request, consent and authorize [facility] and its employed or contracted physicians, physician assistants, nurse practitioners or other licensed health care professionals (the Practitioner), to provide me with telemedicine health care services (the Services") as deemed necessary by the treating Practitioner. I acknowledge and consent to receive the Services by the Practitioner via telemedicine. I understand that the telemedicine visit will involve communicating with the Practitioner through live audiovisual communication technology and the disclosure of certain medical information by electronic transmission. I acknowledge that I have been given the opportunity to request an in-person assessment or other available alternative prior to the telemedicine visit and am voluntarily participating in the telemedicine visit.  I understand that I have the right to withhold or withdraw my consent to the use of telemedicine in the course of my care at any time, without affecting my right to future care or treatment, and that the Practitioner or I may terminate the telemedicine visit at any time. I understand that I have the right to inspect all information obtained and/or recorded in the course of the telemedicine visit and may receive copies of available information for a reasonable fee.  I understand that some of the potential risks of receiving the Services via telemedicine include:   Delay or interruption in medical evaluation due to technological equipment failure or disruption;  Information transmitted may not be sufficient (e.g. poor resolution of images) to allow for appropriate medical decision making by the Practitioner; and/or   In rare instances, security protocols could fail, causing a breach of personal health information.  Furthermore, I acknowledge that it is my responsibility to provide information  about my medical history, conditions and care that is complete and accurate to the best of my ability. I acknowledge that Practitioner's advice, recommendations, and/or decision may be based on factors not within their control, such as incomplete or inaccurate data provided by me or distortions of diagnostic images or specimens that may result from electronic transmissions. I understand that the practice of medicine is not an exact science and that Practitioner makes no warranties or guarantees regarding treatment outcomes. I acknowledge that I will receive a copy of this consent concurrently upon execution via email to the email address I last provided but may also request a printed copy by calling the office of [facility] [place].    I understand that my insurance will be billed for this visit.   I have read or had this consent read to me.  I understand the contents of this consent, which adequately explains the benefits and risks of the Services being provided via telemedicine.   I have been provided ample opportunity to ask questions regarding this consent and the Services and have had my questions answered to my satisfaction.  I  give my informed consent for the services to be provided through the use of telemedicine in my medical care  By participating in this telemedicine visit I agree to the above.

## 2019-04-08 DIAGNOSIS — N8111 Cystocele, midline: Secondary | ICD-10-CM | POA: Diagnosis not present

## 2019-04-08 DIAGNOSIS — N3000 Acute cystitis without hematuria: Secondary | ICD-10-CM | POA: Diagnosis not present
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
## 2019-04-20 ENCOUNTER — Telehealth (INDEPENDENT_AMBULATORY_CARE_PROVIDER_SITE_OTHER): Payer: Medicare Other | Admitting: Cardiovascular Disease

## 2019-04-20 ENCOUNTER — Ambulatory Visit: Payer: Medicare Other | Admitting: Cardiovascular Disease

## 2019-04-20 ENCOUNTER — Other Ambulatory Visit: Payer: Self-pay

## 2019-04-20 VITALS — Ht 63.0 in | Wt 140.0 lb

## 2019-04-20 DIAGNOSIS — Z951 Presence of aortocoronary bypass graft: Secondary | ICD-10-CM | POA: Diagnosis not present

## 2019-04-20 NOTE — Patient Instructions (Addendum)

## 2019-04-22 DIAGNOSIS — H35372 Puckering of macula, left eye: Secondary | ICD-10-CM | POA: Diagnosis not present

## 2019-04-22 DIAGNOSIS — H401231 Low-tension glaucoma, bilateral, mild stage: Secondary | ICD-10-CM | POA: Diagnosis not present

## 2019-04-22 DIAGNOSIS — N952 Postmenopausal atrophic vaginitis: Secondary | ICD-10-CM | POA: Diagnosis not present

## 2019-04-22 DIAGNOSIS — N8110 Cystocele, unspecified: Secondary | ICD-10-CM | POA: Diagnosis not present

## 2019-04-22 DIAGNOSIS — N3000 Acute cystitis without hematuria: Secondary | ICD-10-CM | POA: Diagnosis not present

## 2019-04-22 DIAGNOSIS — R8271 Bacteriuria: Secondary | ICD-10-CM | POA: Diagnosis not present

## 2019-04-22 DIAGNOSIS — H0102A Squamous blepharitis right eye, upper and lower eyelids: Secondary | ICD-10-CM | POA: Diagnosis not present

## 2019-04-22 DIAGNOSIS — H04123 Dry eye syndrome of bilateral lacrimal glands: Secondary | ICD-10-CM | POA: Diagnosis not present

## 2019-04-22 DIAGNOSIS — H0102B Squamous blepharitis left eye, upper and lower eyelids: Secondary | ICD-10-CM | POA: Diagnosis not present

## 2019-05-06 DIAGNOSIS — M545 Low back pain: Secondary | ICD-10-CM | POA: Diagnosis not present

## 2019-05-06 DIAGNOSIS — R2689 Other abnormalities of gait and mobility: Secondary | ICD-10-CM | POA: Diagnosis not present

## 2019-05-08 ENCOUNTER — Other Ambulatory Visit: Payer: Self-pay | Admitting: Cardiovascular Disease

## 2019-05-11 DIAGNOSIS — M130 Polyarthritis, unspecified: Secondary | ICD-10-CM | POA: Diagnosis not present

## 2019-05-11 DIAGNOSIS — I119 Hypertensive heart disease without heart failure: Secondary | ICD-10-CM | POA: Diagnosis not present

## 2019-05-11 DIAGNOSIS — Z8673 Personal history of transient ischemic attack (TIA), and cerebral infarction without residual deficits: Secondary | ICD-10-CM | POA: Diagnosis not present

## 2019-05-11 DIAGNOSIS — E1169 Type 2 diabetes mellitus with other specified complication: Secondary | ICD-10-CM | POA: Diagnosis not present

## 2019-05-19 DIAGNOSIS — R310 Gross hematuria: Secondary | ICD-10-CM | POA: Diagnosis not present

## 2019-05-19 DIAGNOSIS — R311 Benign essential microscopic hematuria: Secondary | ICD-10-CM | POA: Diagnosis not present

## 2019-05-24 DIAGNOSIS — R310 Gross hematuria: Secondary | ICD-10-CM | POA: Diagnosis not present

## 2019-05-27 DIAGNOSIS — K4090 Unilateral inguinal hernia, without obstruction or gangrene, not specified as recurrent: Secondary | ICD-10-CM | POA: Diagnosis not present

## 2019-05-27 DIAGNOSIS — R310 Gross hematuria: Secondary | ICD-10-CM | POA: Diagnosis not present

## 2019-05-27 DIAGNOSIS — D3502 Benign neoplasm of left adrenal gland: Secondary | ICD-10-CM | POA: Diagnosis not present

## 2019-05-27 DIAGNOSIS — D3501 Benign neoplasm of right adrenal gland: Secondary | ICD-10-CM | POA: Diagnosis not present

## 2019-05-27 DIAGNOSIS — N200 Calculus of kidney: Secondary | ICD-10-CM | POA: Diagnosis not present

## 2019-06-02 DIAGNOSIS — N8111 Cystocele, midline: Secondary | ICD-10-CM | POA: Diagnosis not present

## 2019-06-02 DIAGNOSIS — N952 Postmenopausal atrophic vaginitis: Secondary | ICD-10-CM | POA: Diagnosis not present

## 2019-06-02 DIAGNOSIS — I10 Essential (primary) hypertension: Secondary | ICD-10-CM | POA: Diagnosis not present

## 2019-06-02 DIAGNOSIS — Z0000 Encounter for general adult medical examination without abnormal findings: Secondary | ICD-10-CM | POA: Diagnosis not present

## 2019-06-25 ENCOUNTER — Other Ambulatory Visit: Payer: Self-pay | Admitting: Cardiovascular Disease

## 2019-06-25 DIAGNOSIS — N261 Atrophy of kidney (terminal): Secondary | ICD-10-CM | POA: Diagnosis not present

## 2019-06-25 DIAGNOSIS — R310 Gross hematuria: Secondary | ICD-10-CM | POA: Diagnosis not present

## 2019-06-28 DIAGNOSIS — E782 Mixed hyperlipidemia: Secondary | ICD-10-CM | POA: Diagnosis not present

## 2019-06-28 DIAGNOSIS — I10 Essential (primary) hypertension: Secondary | ICD-10-CM | POA: Diagnosis not present

## 2019-06-28 DIAGNOSIS — I119 Hypertensive heart disease without heart failure: Secondary | ICD-10-CM | POA: Diagnosis not present

## 2019-06-28 DIAGNOSIS — I4891 Unspecified atrial fibrillation: Secondary | ICD-10-CM | POA: Diagnosis not present

## 2019-07-21 DIAGNOSIS — H401231 Low-tension glaucoma, bilateral, mild stage: Secondary | ICD-10-CM | POA: Diagnosis not present

## 2019-07-21 DIAGNOSIS — H0102B Squamous blepharitis left eye, upper and lower eyelids: Secondary | ICD-10-CM | POA: Diagnosis not present

## 2019-07-21 DIAGNOSIS — H35372 Puckering of macula, left eye: Secondary | ICD-10-CM | POA: Diagnosis not present

## 2019-07-21 DIAGNOSIS — H0102A Squamous blepharitis right eye, upper and lower eyelids: Secondary | ICD-10-CM | POA: Diagnosis not present

## 2019-07-21 DIAGNOSIS — H04123 Dry eye syndrome of bilateral lacrimal glands: Secondary | ICD-10-CM | POA: Diagnosis not present

## 2019-07-22 DIAGNOSIS — K4090 Unilateral inguinal hernia, without obstruction or gangrene, not specified as recurrent: Secondary | ICD-10-CM | POA: Diagnosis not present

## 2019-07-22 DIAGNOSIS — Z7901 Long term (current) use of anticoagulants: Secondary | ICD-10-CM | POA: Diagnosis not present

## 2019-07-22 DIAGNOSIS — I252 Old myocardial infarction: Secondary | ICD-10-CM | POA: Diagnosis not present

## 2019-07-22 DIAGNOSIS — Z951 Presence of aortocoronary bypass graft: Secondary | ICD-10-CM | POA: Diagnosis not present

## 2019-07-22 DIAGNOSIS — Z8673 Personal history of transient ischemic attack (TIA), and cerebral infarction without residual deficits: Secondary | ICD-10-CM | POA: Diagnosis not present

## 2019-08-03 DIAGNOSIS — E1169 Type 2 diabetes mellitus with other specified complication: Secondary | ICD-10-CM | POA: Diagnosis not present

## 2019-08-03 DIAGNOSIS — I951 Orthostatic hypotension: Secondary | ICD-10-CM | POA: Diagnosis not present

## 2019-08-03 DIAGNOSIS — I480 Paroxysmal atrial fibrillation: Secondary | ICD-10-CM | POA: Diagnosis not present

## 2019-08-23 DIAGNOSIS — M545 Low back pain: Secondary | ICD-10-CM | POA: Diagnosis not present

## 2019-08-23 DIAGNOSIS — M4696 Unspecified inflammatory spondylopathy, lumbar region: Secondary | ICD-10-CM | POA: Diagnosis not present

## 2019-08-24 DIAGNOSIS — M2560 Stiffness of unspecified joint, not elsewhere classified: Secondary | ICD-10-CM | POA: Diagnosis not present

## 2019-08-24 DIAGNOSIS — M488X6 Other specified spondylopathies, lumbar region: Secondary | ICD-10-CM | POA: Diagnosis not present

## 2019-08-26 DIAGNOSIS — M488X6 Other specified spondylopathies, lumbar region: Secondary | ICD-10-CM | POA: Diagnosis not present

## 2019-08-26 DIAGNOSIS — M2560 Stiffness of unspecified joint, not elsewhere classified: Secondary | ICD-10-CM | POA: Diagnosis not present

## 2019-08-30 DIAGNOSIS — M2560 Stiffness of unspecified joint, not elsewhere classified: Secondary | ICD-10-CM | POA: Diagnosis not present

## 2019-08-30 DIAGNOSIS — M47816 Spondylosis without myelopathy or radiculopathy, lumbar region: Secondary | ICD-10-CM | POA: Diagnosis not present

## 2019-08-30 DIAGNOSIS — M1612 Unilateral primary osteoarthritis, left hip: Secondary | ICD-10-CM | POA: Diagnosis not present

## 2019-08-30 DIAGNOSIS — M488X6 Other specified spondylopathies, lumbar region: Secondary | ICD-10-CM | POA: Diagnosis not present

## 2019-09-01 DIAGNOSIS — M2560 Stiffness of unspecified joint, not elsewhere classified: Secondary | ICD-10-CM | POA: Diagnosis not present

## 2019-09-01 DIAGNOSIS — M488X6 Other specified spondylopathies, lumbar region: Secondary | ICD-10-CM | POA: Diagnosis not present

## 2019-09-06 DIAGNOSIS — M488X6 Other specified spondylopathies, lumbar region: Secondary | ICD-10-CM | POA: Diagnosis not present

## 2019-09-07 DIAGNOSIS — M47816 Spondylosis without myelopathy or radiculopathy, lumbar region: Secondary | ICD-10-CM | POA: Diagnosis not present

## 2019-09-14 ENCOUNTER — Other Ambulatory Visit: Payer: Self-pay | Admitting: Pharmacist

## 2019-09-14 ENCOUNTER — Other Ambulatory Visit: Payer: Self-pay | Admitting: Pharmacy Technician

## 2019-09-14 NOTE — Patient Outreach (Addendum)
[place] [facility]) Care Management  [place] 09/14/2019  Audrey Mullen 13-Feb-1941 [id number]  Patient contacted [facility] pharmacy technician to request assistance applying for Eliquis patient assistance program.  She was previously assisted with Januvia patient assistance program through [employer] earlier this year and also with both Eliquis and Januvia in 2019.  Per review of chart, patient with PMHx significant for CAD s/p CABG 1/'18, CVA post-op likely 2/2 hypotension, anemia, and PAF, CHADVASC = 8, now on chronic anticoagulation, also T2DM, GERD, HTN, HLD, arthritis, carpal tunnel.  Eliquis active medication per cardiology notes 6/'20 and on medication review.    Eliquis dose per EMR is currently 3m BID.  Per PI, dose should be renally adjusted if 2 of 3 criteria met: SCr > 1.5, weight < 60kg, or age > 89YO.  Per EMR, SCr trending up (last value from 2018), age 78YO, and weight 63kg.   Call placed to Dr. BFransico Settersoffice to request more updated labs:   -Most recent SCr = 1.88 (10/'19) --> 2.13 (4/'20), CrCl ~21 ml/min -Most recent weight = 65kg (08/08/2019) -Confirmed Dr. BCriss Rosaleshas written RX for Eliquis in the past -Next appt with PCP 12/03/2019  Medications that may need to be renally adjusted: -Rosuvastatin: max 18mdaily if CrCl < 30 ml/min -Sitagliptin: 2575mnce daily if CrCl < 30 ml/min -Spironolactone: monitor potassium / renal insufficiency closely -Eliquis as above   Plan: -Will route note to PCP and cardiologist to assess Eliquis dose and need for renally adjusting down to 2.5mg67mD as well as other medications listed above  CollRalene BathearmD, BCPSSleepy Eye-[phone]

## 2019-09-14 NOTE — Patient Outreach (Signed)
[place] [facility]) Care Management  09/14/2019  Audrey Mullen 1941-02-02 [id number]                                       Medication Assistance Referral  Referral From: Self  Medication/Company: Eliquis / [place] Patient application portion:  Mailed Provider application portion: Faxed  to Dr. Clayburn Pert Provider address/fax verified via: Office website   Follow up:  Will follow up with patient in 7-10 business days to confirm application(s) have been received.  [name] Chana Bode [place] Certified Pharmacy Technician [place] Management Direct Dial:[phone]

## 2019-09-16 ENCOUNTER — Other Ambulatory Visit: Payer: Self-pay | Admitting: Pharmacist

## 2019-09-16 ENCOUNTER — Ambulatory Visit: Payer: Self-pay | Admitting: Pharmacist

## 2019-09-16 NOTE — Patient Outreach (Signed)
[place] [facility]) Care Management  [place] 09/16/2019  Audrey Mullen 01-Jan-1941 [id number]  Communication received from cardiologist who agrees with decrease in apixaban dose, recommends further management with PCP who has been prescribing medication  Care coordination call placed to PCP Dr. Fransico Setters office.  Spoke with [name], Therapist, sports.  [name] reports cardiologist is supposed to be managing apixaban dosing.  She reports Dr. Criss Rosales has not received any messages from 10/27 regarding renal dose clarifications that were faxed to office and confirmed received by office.  [name] will take 2nd message to Dr. Criss Rosales to clarify dosing on simvastatin, sitagliptin, and apixaban.   Plan: Await call back from PCP office   Ralene Bathe, PharmD, [place] [phone]

## 2019-09-21 ENCOUNTER — Other Ambulatory Visit: Payer: Self-pay | Admitting: Pharmacist

## 2019-09-21 ENCOUNTER — Ambulatory Visit: Payer: Self-pay | Admitting: Pharmacist

## 2019-09-21 NOTE — Patient Outreach (Signed)
[facility] [facility]) Care Management  [place] 09/21/2019  KYNDAL CORRA 14-Sep-1941 [id number]  Reason for call: f/u on renal dose adjustments  Successful call with RN, [name], at Dr. Fransico Setters office.  [name] reports that Dr. Criss Rosales has agreed to lower doses of rosuvastatin, apixaban, and sitagliptin.  She has not sent in new prescriptions yet though to pharmacy.  She requests that patient call when refills are due.  She also requests that [facility] fax over new patient assistance program forms for apixaban and sitagliptin for updated doses.   Successful call to patient.     Updated patient on the recommendation to lower the apixaban dose to 2.5mg  BID. Patient reports she just picked up prescription for apixaban 5mg  BID from her pharmacy and it was very expensive.   The apixaban pill is not scored however patient states she will try to use pillcutter to cut in half.  I recommended that if she is unable to split pill accurately, that she continue to take current dose of 5mg  BID until next refill is due or if she is approved for patient assistance program.  Patient voiced understanding.   Reviewed with patient updated dosing recommendation for rosuvastatin 10mg  (decrease from 20mg ).  Patient again states she can cut this pill in half.   Reviewed with patient updated dosing on sitagliptin 25mg  daily.  She has current supply of 100mg  tablets from patient assistance program and states she will cut in 1/4.  I recommended that instead of doing this (since it may not be accurate dose with such small portion of pill) that MD send in new RX for updated dose.    Plan: Will request that [place] technician assist with re-faxing apixaban and sitagliptin forms to Dr. Criss Rosales for updated doses. Will f/u in 2-3 weeks.   Ralene Bathe, PharmD, [place] [phone]

## 2019-09-23 DIAGNOSIS — R8279 Other abnormal findings on microbiological examination of urine: Secondary | ICD-10-CM | POA: Diagnosis not present

## 2019-09-23 DIAGNOSIS — N200 Calculus of kidney: Secondary | ICD-10-CM | POA: Diagnosis not present

## 2019-09-23 DIAGNOSIS — R310 Gross hematuria: Secondary | ICD-10-CM | POA: Diagnosis not present

## 2019-09-23 DIAGNOSIS — N952 Postmenopausal atrophic vaginitis: Secondary | ICD-10-CM | POA: Diagnosis not present

## 2019-09-27 ENCOUNTER — Other Ambulatory Visit: Payer: Self-pay | Admitting: Pharmacy Technician

## 2019-09-27 NOTE — Patient Outreach (Signed)
[facility] [facility]) Care Management  09/27/2019  Mishawn Wenzell [facility] [birth year] [id number]   Received patient portion(s) of patient assistance application(s) for Eliquis. Faxed completed application and required documents into [employer].  Will follow up with company(ies) in 5-7 business days to check status of application(s).  [name] Chana Bode [place] Certified Pharmacy Technician [place] Management Direct Dial:[phone]

## 2019-09-29 DIAGNOSIS — M1612 Unilateral primary osteoarthritis, left hip: Secondary | ICD-10-CM | POA: Diagnosis not present

## 2019-09-30 DIAGNOSIS — N200 Calculus of kidney: Secondary | ICD-10-CM | POA: Diagnosis not present

## 2019-09-30 DIAGNOSIS — N1832 Chronic kidney disease, stage 3b: Secondary | ICD-10-CM | POA: Diagnosis not present

## 2019-09-30 DIAGNOSIS — R310 Gross hematuria: Secondary | ICD-10-CM | POA: Diagnosis not present

## 2019-09-30 DIAGNOSIS — I129 Hypertensive chronic kidney disease with stage 1 through stage 4 chronic kidney disease, or unspecified chronic kidney disease: Secondary | ICD-10-CM | POA: Diagnosis not present

## 2019-09-30 DIAGNOSIS — E1122 Type 2 diabetes mellitus with diabetic chronic kidney disease: Secondary | ICD-10-CM | POA: Diagnosis not present

## 2019-10-01 DIAGNOSIS — R3121 Asymptomatic microscopic hematuria: Secondary | ICD-10-CM | POA: Diagnosis not present

## 2019-10-01 DIAGNOSIS — N3000 Acute cystitis without hematuria: Secondary | ICD-10-CM | POA: Diagnosis not present

## 2019-10-05 ENCOUNTER — Other Ambulatory Visit: Payer: Self-pay | Admitting: Pharmacy Technician

## 2019-10-05 NOTE — Patient Outreach (Signed)
[place] [facility]) Care Management  10/05/2019  Audrey Mullen December 20, 1940 [id number]    Follow up call placed to [place] regarding patient assistance application(s) for [name] confirms patient has been approved as of 11/12 until 11/18/19. 90 day supply of medication is currently out for delivery.  Follow up:  Will follow up with patient in 2-3 business days to confirm medication has been received.  [name] Chana Bode [place] Certified Pharmacy Technician [place] Management Direct [phone]

## 2019-10-06 ENCOUNTER — Ambulatory Visit: Payer: Self-pay | Admitting: Pharmacist

## 2019-10-06 NOTE — Progress Notes (Signed)
Date:  10/18/2019   ID:  Audrey Mullen, DOB 12-May-1941, MRN [id number]  Provider Location: Office  PCP:  Lucianne Lei, MD  Cardiologist:   Johnsie Cancel Electrophysiologist:  None   Evaluation Performed:  Follow-Up Visit  Chief Complaint:  CAD  History of Present Illness:    78 y.o.  CAD post CABG by Dr Roxan Hockey 11/28/16 LIMA to LAD, SVG to OM, SVG D2 and SVG to RCA.  Post op course complicated by CVA with right sided weakness. Thought due to hypotension anemia and PAF. CHADVASC 8 On eliquis now. EF normal by echo 07/04/17 reviewed.   Carpal Tunnel surgery on right hand not successful still with weakness and numbness Feels like both hands are weak   No chest pain  Son lives at home with her and helpful Husband has some dementia   No cardiac complaints   The patient does not have symptoms concerning for COVID-19 infection (fever, chills, cough, or new shortness of breath).    Past Medical History:  Diagnosis Date  . Anginal pain ([place])   . Arthritis   . Coronary artery disease   . Diabetes mellitus without complication ([place])   . Dysrhythmia    atrail fibrillation  . GERD (gastroesophageal reflux disease)   . Glaucoma    bilat   . Hyperlipidemia   . Hypertension   . Kidney stone   . Stroke ([place])   . Urinary tract infection   . Wears glasses    Past Surgical History:  Procedure Laterality Date  . CARDIAC CATHETERIZATION N/A 11/25/2016   Procedure: Left Heart Cath and Coronary Angiography;  Surgeon: Lorretta Harp, MD;  Location: [place] [facility];  Service: Cardiovascular;  Laterality: N/A;  . CORONARY ARTERY BYPASS GRAFT N/A 11/28/2016   Procedure: CORONARY ARTERY BYPASS GRAFTING (CABG) x 4;  Surgeon: Melrose Nakayama, MD;  Location: [facility];  Service: Open Heart Surgery;  Laterality: N/A;  . CYSTOSCOPY WITH RETROGRADE PYELOGRAM, URETEROSCOPY AND STENT PLACEMENT Left 06/03/2016   Procedure: CYSTOSCOPY WITH LEFT RETROGRADE PYELOGRAM, URETEROSCOPY, REMOVAL  OF LEFT NEPHROSTOMY TUBE, INSERTION OF DOUBLE J STENT, LEFT;  Surgeon: Carolan Clines, MD;  [facility];  Service: Urology;  Laterality: Left;  . ENDOVEIN HARVEST OF GREATER SAPHENOUS VEIN Right 11/28/2016   Procedure: ENDOVEIN HARVEST OF GREATER SAPHENOUS VEIN;  Surgeon: Melrose Nakayama, MD;  Location: [facility];  Service: Open Heart Surgery;  Laterality: Right;  . EYE SURGERY     cataract removed per left eye   . HOLMIUM LASER APPLICATION Left [id number]   Procedure: HOLMIUM LASER OF STONE ;  Surgeon: Carolan Clines, MD;  [facility];  Service: Urology;  Laterality: Left;  Marland Kitchen MAZE N/A 11/28/2016   Procedure: MAZE;  Surgeon: Melrose Nakayama, MD;  Location: [facility];  Service: Open Heart Surgery;  Laterality: N/A;  . ROTATOR CUFF REPAIR    . TEE WITHOUT CARDIOVERSION N/A 11/28/2016   Procedure: TRANSESOPHAGEAL ECHOCARDIOGRAM (TEE);  Surgeon: Melrose Nakayama, MD;  Location: [facility];  Service: Open Heart Surgery;  Laterality: N/A;     Current Meds  Medication Sig  . apixaban (ELIQUIS) 2.5 MG TABS tablet Take 2.5 mg by mouth 2 (two) times daily.  Marland Kitchen aspirin EC 81 MG tablet Take 1 tablet (81 mg total) by mouth daily.  . carvedilol (COREG) 12.5 MG tablet TAKE 1 TABLET BY MOUTH TWICE DAILY WITH A MEAL  . diltiazem (CARDIZEM CD) 240 MG 24 hr capsule TAKE 1 CAPSULE(240 MG) BY  MOUTH DAILY  . gabapentin (NEURONTIN) 100 MG capsule Take 1 capsule (100 mg total) by mouth at bedtime.  Marland Kitchen latanoprost (XALATAN) 0.005 % ophthalmic solution Place 1 drop into both eyes at bedtime.  . pantoprazole (PROTONIX) 40 MG tablet TAKE 1 TABLET(40 MG) BY MOUTH DAILY  . rosuvastatin (CRESTOR) 5 MG tablet Take 5 mg by mouth daily.   Marland Kitchen senna-docusate (SENOKOT-S) 8.6-50 MG tablet Take 1 tablet by mouth at bedtime as needed for mild constipation.  . sitaGLIPtin (JANUVIA) 25 MG tablet Take 25 mg by mouth daily.   . SODIUM BICARBONATE PO Take 1 tablet by mouth 2 (two) times daily.     Allergies:   Patient  has no known allergies.   Social History   Tobacco Use  . Smoking status: Never Smoker  . Smokeless tobacco: Never Used  Substance Use Topics  . Alcohol use: No  . Drug use: No     Family Hx: The patient's family history includes Heart disease in her father. There is no history of CAD.  ROS:   Please see the history of present illness.     All other systems reviewed and are negative.   Prior CV studies:   The following studies were reviewed today:  Carotid August 2018 1-39% plaque bilateral no stenosis  Echo August 2018  EF 60-65%  No valve disease   Labs/Other Tests and Data Reviewed:    EKG:   NSR rate 74 nonspecific ST changes 10/18/19 SR rate 72 normal   Recent Labs: No results found for requested labs within last 8760 hours.   Recent Lipid Panel Lab Results  Component Value Date/Time   CHOL 96 07/04/2017 05:11 AM   TRIG 91 07/04/2017 05:11 AM   HDL 33 (L) 07/04/2017 05:11 AM   CHOLHDL 2.9 07/04/2017 05:11 AM   LDLCALC 45 07/04/2017 05:11 AM    Wt Readings from Last 3 Encounters:  10/18/19 148 lb (67.1 kg)  04/20/19 140 lb (63.5 kg)  04/15/18 147 lb (66.7 kg)     Objective:    Vital Signs:  BP 136/88   Pulse 72   Ht 5\' 4"  (1.626 m)   Wt 148 lb (67.1 kg)   SpO2 99%   BMI 25.40 kg/m    Affect appropriate Healthy:  appears stated age 89: normal Neck supple with no adenopathy JVP normal no bruits no thyromegaly Lungs clear with no wheezing and good diaphragmatic motion Heart:  S1/S2 no murmur, no rub, gallop or click PMI normal post sternotomy  Abdomen: benighn, BS positve, no tenderness, no AAA no bruit.  No HSM or HJR Distal pulses intact with no bruits No edema Neuro non-focal Skin warm and dry No muscular weakness   ASSESSMENT & PLAN:    1.  CVA 07/03/17 post CABG with hypotension continue eliquis    2.  Mild carotid disease, on statin and ASA  1-39% by duplex 06/25/17 will update   3.  PAF s/p Maze on Eliquis with CVA  Maintaining NSR   4.  CAD s.p CABG in 2018 no chest pain stable   5. HLD on statin last LDL historically below 70 labs with primary   6.  DM-2 now on Januvia per PCP A1c shows poor control 8.8   7. Carpal Tunnel:  F/u PT/OT suspect she will not get full recovery at this point   COVID-19 Education: The signs and symptoms of COVID-19 were discussed with the patient and how to seek care for testing (follow up  with PCP or arrange E-visit).  The importance of social distancing was discussed today.  Time:   Today, I have spent 30 minutes with the patient     Medication Adjustments/Labs and Tests Ordered: Current medicines are reviewed at length with the patient today.  Concerns regarding medicines are outlined above.   Tests Ordered:  Carotid Duplex   Medication Changes: No orders of the defined types were placed in this encounter.   Disposition:  Follow up in a year   Signed, Jenkins Rouge, MD  10/18/2019 8:55 AM    [place]

## 2019-10-07 ENCOUNTER — Other Ambulatory Visit: Payer: Self-pay | Admitting: Pharmacy Technician

## 2019-10-07 NOTE — Patient Outreach (Signed)
[place] [facility]) Care Management  10/07/2019  Lewis 1941/06/01 [id number]    Successful call placed to patient regarding patient assistance medication delivery of Eliquis, HIPAA identifiers verified. Mrs. Foskett confirms she received a 90 days supply of Eliquis in the mail. She also confirms she received a 90 day supply of her new dose of Januvia from [employer].  Informed patient that I would mail out [employer] patient assistance application to start process for [place] for 2021. She confirms Dr. Lucianne Lei is prescriber.  Follow up:  Will follow up with patient in 15-20 business days to cofirm application has been received.  [name] Chana Bode [place] Certified Pharmacy Technician [place] Management Direct Dial:[phone]

## 2019-10-11 ENCOUNTER — Other Ambulatory Visit: Payer: Self-pay | Admitting: Pharmacist

## 2019-10-11 DIAGNOSIS — N183 Chronic kidney disease, stage 3 unspecified: Secondary | ICD-10-CM | POA: Diagnosis not present

## 2019-10-11 NOTE — Patient Outreach (Signed)
[place] [facility]) Care Management  [place] 10/11/2019  Audrey Mullen Jun 02, 1941 [id number]  Reason for call: f/u on medication changes  Successful call with patient today.  Patient reports she recently had o/v with "kidney doctor" and had rosuvastatin dose lowered further to 5mg .  She confirms that she has received reduced dose of sitagliptin and apixaban and has discarded previous dose.  She denies having any questions on her medications.  We reviewed that she should receive application for sitagliptin in the mail soon for renewal in 2021 and that for apixaban, she cannot renew until she has spent required out-of-pocket expenditure again in 2021.  Patient voiced understanding.   Plan: [place] technician will continue to assist with renewal process for sitagliptin via [employer] for 2021.   Ralene Bathe, PharmD, [place] [phone]

## 2019-10-13 ENCOUNTER — Ambulatory Visit: Payer: Self-pay | Admitting: Pharmacist

## 2019-10-18 ENCOUNTER — Encounter: Payer: Self-pay | Admitting: Cardiovascular Disease

## 2019-10-18 ENCOUNTER — Ambulatory Visit: Payer: Medicare Other | Admitting: Cardiovascular Disease

## 2019-10-18 ENCOUNTER — Other Ambulatory Visit: Payer: Self-pay

## 2019-10-18 VITALS — BP 136/88 | HR 72 | Ht 64.0 in | Wt 148.0 lb

## 2019-10-18 DIAGNOSIS — I779 Disorder of arteries and arterioles, unspecified: Secondary | ICD-10-CM

## 2019-10-18 DIAGNOSIS — I480 Paroxysmal atrial fibrillation: Secondary | ICD-10-CM | POA: Diagnosis not present

## 2019-10-18 NOTE — Patient Instructions (Signed)
Medication Instructions:   *If you need a refill on your cardiac medications before your next appointment, please call your pharmacy*  Lab Work:  If you have labs (blood work) drawn today and your tests are completely normal, you will receive your results only by: Marland Kitchen MyChart Message (if you have MyChart) OR . A paper copy in the mail If you have any lab test that is abnormal or we need to change your treatment, we will call you to review the results.  Testing/Procedures: Your physician has requested that you have a carotid duplex. This test is an ultrasound of the carotid arteries in your neck. It looks at blood flow through these arteries that supply the brain with blood. Allow one hour for this exam. There are no restrictions or special instructions.  Follow-Up: At [facility], you and your health needs are our priority.  As part of our continuing mission to provide you with exceptional heart care, we have created designated Provider Care Teams.  These Care Teams include your primary Cardiologist (physician) and Advanced Practice Providers (APPs -  Physician Assistants and Nurse Practitioners) who all work together to provide you with the care you need, when you need it.  Your next appointment:   12 month(s)  The format for your next appointment:   In Person  Provider:   You may see Dr. Johnsie Cancel or one of the following Advanced Practice Providers on your designated Care Team:    Truitt Merle, NP  Cecilie Kicks, NP  Kathyrn Drown, NP

## 2019-10-25 DIAGNOSIS — N183 Chronic kidney disease, stage 3 unspecified: Secondary | ICD-10-CM | POA: Diagnosis not present

## 2019-10-26 ENCOUNTER — Other Ambulatory Visit: Payer: Self-pay

## 2019-10-26 ENCOUNTER — Ambulatory Visit (HOSPITAL_COMMUNITY)
Admission: RE | Admit: 2019-10-26 | Discharge: 2019-10-26 | Disposition: A | Discharge status: Home / Self Care | Payer: Medicare Other | Source: Ambulatory Visit | Attending: Cardiovascular Disease | Admitting: Cardiovascular Disease

## 2019-10-26 DIAGNOSIS — I779 Disorder of arteries and arterioles, unspecified: Secondary | ICD-10-CM | POA: Diagnosis not present

## 2019-11-02 DIAGNOSIS — H401231 Low-tension glaucoma, bilateral, mild stage: Secondary | ICD-10-CM | POA: Diagnosis not present

## 2019-11-02 DIAGNOSIS — H35372 Puckering of macula, left eye: Secondary | ICD-10-CM | POA: Diagnosis not present

## 2019-11-02 DIAGNOSIS — H0102B Squamous blepharitis left eye, upper and lower eyelids: Secondary | ICD-10-CM | POA: Diagnosis not present

## 2019-11-02 DIAGNOSIS — H0102A Squamous blepharitis right eye, upper and lower eyelids: Secondary | ICD-10-CM | POA: Diagnosis not present

## 2019-11-02 DIAGNOSIS — H04123 Dry eye syndrome of bilateral lacrimal glands: Secondary | ICD-10-CM | POA: Diagnosis not present

## 2019-12-03 DIAGNOSIS — E1169 Type 2 diabetes mellitus with other specified complication: Secondary | ICD-10-CM | POA: Diagnosis not present

## 2019-12-03 DIAGNOSIS — E1100 Type 2 diabetes mellitus with hyperosmolarity without nonketotic hyperglycemic-hyperosmolar coma (NKHHC): Secondary | ICD-10-CM | POA: Diagnosis not present

## 2019-12-03 DIAGNOSIS — I10 Essential (primary) hypertension: Secondary | ICD-10-CM | POA: Diagnosis not present

## 2019-12-03 DIAGNOSIS — I110 Hypertensive heart disease with heart failure: Secondary | ICD-10-CM | POA: Diagnosis not present

## 2019-12-03 DIAGNOSIS — I119 Hypertensive heart disease without heart failure: Secondary | ICD-10-CM | POA: Diagnosis not present

## 2019-12-10 ENCOUNTER — Other Ambulatory Visit: Payer: Self-pay | Admitting: Pharmacy Technician

## 2019-12-10 NOTE — Patient Outreach (Signed)
[facility]) Care Management  12/10/2019  Audrey Mullen May 29, 1941 [id number]   Call placed to Dr. Fransico Setters office in reference to [employer] application for [employer]. [name] did not across any records of receiving blank application that was sent in to them for completion. She requested I refax it to that @ her attention. Informed her that application would need to be mailed back into and my address is including on the cover sheet.  Will follow up with office if application has not been received in the next 7-10 business days.  [name] Chana Bode [place] Certified Pharmacy Technician [place] Management Direct [phone]

## 2019-12-20 DIAGNOSIS — R8290 Unspecified abnormal findings in urine: Secondary | ICD-10-CM | POA: Diagnosis not present

## 2019-12-22 DIAGNOSIS — N179 Acute kidney failure, unspecified: Secondary | ICD-10-CM | POA: Diagnosis not present

## 2019-12-22 DIAGNOSIS — E1122 Type 2 diabetes mellitus with diabetic chronic kidney disease: Secondary | ICD-10-CM | POA: Diagnosis not present

## 2019-12-22 DIAGNOSIS — I129 Hypertensive chronic kidney disease with stage 1 through stage 4 chronic kidney disease, or unspecified chronic kidney disease: Secondary | ICD-10-CM | POA: Diagnosis not present

## 2019-12-22 DIAGNOSIS — N200 Calculus of kidney: Secondary | ICD-10-CM | POA: Diagnosis not present

## 2019-12-22 DIAGNOSIS — N1832 Chronic kidney disease, stage 3b: Secondary | ICD-10-CM | POA: Diagnosis not present

## 2019-12-23 DIAGNOSIS — I119 Hypertensive heart disease without heart failure: Secondary | ICD-10-CM | POA: Diagnosis not present

## 2019-12-23 DIAGNOSIS — E785 Hyperlipidemia, unspecified: Secondary | ICD-10-CM | POA: Diagnosis not present

## 2019-12-23 DIAGNOSIS — E1100 Type 2 diabetes mellitus with hyperosmolarity without nonketotic hyperglycemic-hyperosmolar coma (NKHHC): Secondary | ICD-10-CM | POA: Diagnosis not present

## 2019-12-24 ENCOUNTER — Other Ambulatory Visit: Payer: Self-pay | Admitting: Nephrology

## 2019-12-24 DIAGNOSIS — R310 Gross hematuria: Secondary | ICD-10-CM

## 2019-12-28 DIAGNOSIS — N952 Postmenopausal atrophic vaginitis: Secondary | ICD-10-CM | POA: Diagnosis not present

## 2019-12-28 DIAGNOSIS — Z4689 Encounter for fitting and adjustment of other specified devices: Secondary | ICD-10-CM | POA: Diagnosis not present

## 2019-12-31 ENCOUNTER — Ambulatory Visit
Admission: RE | Admit: 2019-12-31 | Discharge: 2019-12-31 | Disposition: A | Discharge status: Home / Self Care | Payer: Medicare Other | Source: Ambulatory Visit | Attending: Nephrology | Admitting: Nephrology

## 2019-12-31 DIAGNOSIS — N200 Calculus of kidney: Secondary | ICD-10-CM | POA: Diagnosis not present

## 2019-12-31 DIAGNOSIS — R310 Gross hematuria: Secondary | ICD-10-CM

## 2020-01-07 ENCOUNTER — Other Ambulatory Visit: Payer: Self-pay | Admitting: Pharmacy Technician

## 2020-01-07 NOTE — Patient Outreach (Signed)
[facility]) Care Management  01/07/2020  Audrey Mullen [birth year] [id number]   Incoming call from patient checking on status of Januvia application thru [employer]. Informed Audrey Mullen that I still have not received the prescriber portion of the application from Dr. Perley Jain office.  Contacted Dr. Perley Jain and left voicemail inquiring about application status and requested a return call.  [name] Chana Bode [place] Certified Pharmacy Technician [place] Management Direct Dial:[phone]

## 2020-01-17 DIAGNOSIS — R509 Fever, unspecified: Secondary | ICD-10-CM | POA: Diagnosis not present

## 2020-01-17 DIAGNOSIS — N390 Urinary tract infection, site not specified: Secondary | ICD-10-CM | POA: Diagnosis not present

## 2020-01-18 ENCOUNTER — Other Ambulatory Visit: Payer: Self-pay | Admitting: Pharmacist

## 2020-01-18 NOTE — Patient Outreach (Signed)
[facility]) Care Management  [place]   01/18/2020  Audrey Mullen Mar 16, 1941 [id number]  Provider office has not returned medication assistance forms for Januvia patient assistance program in 2021 despite multiple attempts to contact office.   PCP office now has clinical pharmacist embedded in office and is contracted with upstream pharmacist to see Dr. Fransico Setters patients.   Successful call to pharmacist to transfer referral.  Pharmacist will reach out to patient re: patient assistance for Januvia.   Plan: Will close [facility] pharmacy case.  Am happy to assist in the future as needed.    Ralene Bathe, PharmD, [place] [phone]

## 2020-02-01 DIAGNOSIS — R8290 Unspecified abnormal findings in urine: Secondary | ICD-10-CM | POA: Diagnosis not present

## 2020-02-01 DIAGNOSIS — Z4689 Encounter for fitting and adjustment of other specified devices: Secondary | ICD-10-CM | POA: Diagnosis not present

## 2020-02-02 ENCOUNTER — Telehealth: Payer: Self-pay | Admitting: Cardiovascular Disease

## 2020-02-02 NOTE — Telephone Encounter (Signed)
New message   Patient calling the office for samples of medication:   1.  What medication and dosage are you requesting samples for? Eliquis 2.5   2.  Are you currently out of this medication? Yes

## 2020-02-02 NOTE — Telephone Encounter (Signed)
Called pt's doctor back, Dr. Criss Rosales, to inform them that the pt has contact the pt assistant program [place] for a application. The representative Roselyn Reef, stated that they already had the application and that the doctor needed to fill his part out. I informed her that I would be leaving 2 weeks supply of Eliquis 2.5 mg tablet samples downstairs for pt to pick up and that they could fax over that application, so the doctor could fill it out and that we would fax it into the company. Roselyn Reef verbalized understanding.

## 2020-02-02 NOTE — Telephone Encounter (Signed)
New message   Per Dr. Fransico Setters office need a patient assistance form (bristol myers ) to be forwarded to [facility] for apixaban (ELIQUIS) 2.5 MG TABS tabletapixaban (ELIQUIS) 2.5 MG TABS tablet on behalf of the patient. Please call Jamie at Dr. Fransico Setters office if you have any questions.

## 2020-02-03 MED ORDER — APIXABAN 2.5 MG PO TABS: 2.5000 mg | ORAL_TABLET | Freq: Two times a day (BID) | ORAL | 3 refills | Status: DC

## 2020-02-03 NOTE — Telephone Encounter (Addendum)
**Note De-Identified Dechelle Attaway Obfuscation** I called Dr Perley Jain office and attempted to s/w Roselyn Reef but she kept complaining that she could not hear or understand half of what I was saying but I understood that she wanted me to contact the pt and help her with the application process.   I called the pt who informed me that she completed the [employer] pt asst application for her Eliquis at Dr Perley Jain office and that they told her that they would fax it to [place] or [employer] Pt Asst Program.  There is a note in the pts chart from yesterday stating that Lyla Son was advised by Roselyn Reef at Dr Fransico Setters office that she was faxing the pts completed application to [place] as well.  The pt also states that she was advised by Dr Perley Jain office that they would send a Eliquis RX to [facility] to fill for her but they did not.  I called Dr Perley Jain office and s/w Roselyn Reef again to get a better idea of the situation since she was having problems hearing me the 1st time I called her.  Again there was issues with our call. Roselyn Reef finally advised me that she is talking to me through a computer and that there is a delay.  She denies every telling Tye [place] that the pt completed an application.  I advised Roselyn Reef that I am taking this over and will not need her asst any longer.  I have sent the prescription in to [facility] to fill as requested by the pt.   This will help me to determine if pt asst is an option at this time or not as the pt does have ins coverage and may or may not be in the donut hole.   I will follow up on this tomorrow.

## 2020-02-04 MED ORDER — APIXABAN 2.5 MG PO TABS: 2.5000 mg | ORAL_TABLET | Freq: Two times a day (BID) | ORAL | 3 refills | Status: AC

## 2020-02-04 NOTE — Telephone Encounter (Addendum)
**Note De-Identified [name] Obfuscation** I have called [facility] pharmacy but was on hold for more than 10 mins. I will continue to call.

## 2020-02-04 NOTE — Telephone Encounter (Signed)
**Note De-Identified Audrey Mullen Obfuscation** I s/w a pharmacist at [facility] who advised me that they dont know if a PA is required for the pts Eliquis as it is too soon to refill and will have to wait until 02/25/20 for next refill to get details on the cost as the pt did not have it refilled with them the last time.  It appears the pt has ins Part D coverage.  I called the pt to discuss and she states that she has never had to get a refill because through [facility] she gets pt asst for her Eliquis from [facility] at no charge to her.  I have advised her that I will reach out to Etter Sjogren, CPhT with [facility] to ask for her help in figuring out what is needed for the pt to cont to get her Elquis.Marland Kitchen

## 2020-02-04 NOTE — Telephone Encounter (Signed)
**Note De-Identified Keshan Reha Obfuscation** Per Etter Sjogren, CPhT : [facility] has done the patient assistance for this pts Eliquis in the past but currently [facility] is undergoing some changes regarding their processes now that in some primary practices they have pharmacists working on them. I am currently reaching out to see exactly what we will need to do in this particular situation, so as soon as I have an answer I will let you know, hopefully I will know in the next few days.

## 2020-02-16 DIAGNOSIS — E1100 Type 2 diabetes mellitus with hyperosmolarity without nonketotic hyperglycemic-hyperosmolar coma (NKHHC): Secondary | ICD-10-CM | POA: Diagnosis not present

## 2020-02-16 DIAGNOSIS — I10 Essential (primary) hypertension: Secondary | ICD-10-CM | POA: Diagnosis not present

## 2020-02-16 DIAGNOSIS — E785 Hyperlipidemia, unspecified: Secondary | ICD-10-CM | POA: Diagnosis not present

## 2020-02-18 ENCOUNTER — Telehealth: Payer: Self-pay | Admitting: Cardiovascular Disease

## 2020-02-18 NOTE — Telephone Encounter (Signed)
Called pt's pharmacy and pt's pharmacy stated that they were refilling pt's medication with a partial refill and ordering the rest and it will be ready by Monday. I called the pt to inform her about what the pharmacy stated and pt verbalized understanding. I advised the pt that if she has any other problems, questions or concerns to give our office a call back.

## 2020-02-18 NOTE — Telephone Encounter (Signed)
   Patient calling the office for samples of medication:   1.  What medication and dosage are you requesting samples for? apixaban (ELIQUIS) 2.5 MG TABS tablet  2.  Are you currently out of this medication? Yes and pharmacy said they can refill it on Monday but she needs it today and on the weekends

## 2020-02-21 DIAGNOSIS — H401231 Low-tension glaucoma, bilateral, mild stage: Secondary | ICD-10-CM | POA: Diagnosis not present

## 2020-02-21 DIAGNOSIS — H35372 Puckering of macula, left eye: Secondary | ICD-10-CM | POA: Diagnosis not present

## 2020-02-21 DIAGNOSIS — H353131 Nonexudative age-related macular degeneration, bilateral, early dry stage: Secondary | ICD-10-CM | POA: Diagnosis not present

## 2020-02-21 DIAGNOSIS — H0102A Squamous blepharitis right eye, upper and lower eyelids: Secondary | ICD-10-CM | POA: Diagnosis not present

## 2020-02-21 DIAGNOSIS — H35033 Hypertensive retinopathy, bilateral: Secondary | ICD-10-CM | POA: Diagnosis not present

## 2020-02-29 ENCOUNTER — Other Ambulatory Visit: Payer: Self-pay | Admitting: Cardiovascular Disease

## 2020-03-02 DIAGNOSIS — E782 Mixed hyperlipidemia: Secondary | ICD-10-CM | POA: Diagnosis not present

## 2020-03-02 DIAGNOSIS — R634 Abnormal weight loss: Secondary | ICD-10-CM | POA: Diagnosis not present

## 2020-03-02 DIAGNOSIS — E1169 Type 2 diabetes mellitus with other specified complication: Secondary | ICD-10-CM | POA: Diagnosis not present

## 2020-03-02 DIAGNOSIS — I10 Essential (primary) hypertension: Secondary | ICD-10-CM | POA: Diagnosis not present

## 2020-03-02 DIAGNOSIS — I6930 Unspecified sequelae of cerebral infarction: Secondary | ICD-10-CM | POA: Diagnosis not present

## 2020-03-09 ENCOUNTER — Ambulatory Visit (INDEPENDENT_AMBULATORY_CARE_PROVIDER_SITE_OTHER): Payer: Medicare Other | Admitting: Otolaryngology

## 2020-03-09 ENCOUNTER — Encounter (INDEPENDENT_AMBULATORY_CARE_PROVIDER_SITE_OTHER): Payer: Self-pay | Admitting: Otolaryngology

## 2020-03-09 ENCOUNTER — Other Ambulatory Visit: Payer: Self-pay

## 2020-03-09 VITALS — Temp 97.7°F

## 2020-03-09 DIAGNOSIS — H6522 Chronic serous otitis media, left ear: Secondary | ICD-10-CM | POA: Diagnosis not present

## 2020-03-09 DIAGNOSIS — N1832 Chronic kidney disease, stage 3b: Secondary | ICD-10-CM | POA: Diagnosis not present

## 2020-03-09 NOTE — Progress Notes (Signed)
HPI: Audrey Mullen is a 79 y.o. female who returns today for evaluation of hearing loss.  Patient has had a history of serous otitis in the past and had a T-tube placed in the right ear in 2019.  She is status post BMTs in 2018.  At last follow-up the left TM was clear..  Past Medical History:  Diagnosis Date  . Anginal pain ([place])   . Arthritis   . Coronary artery disease   . Diabetes mellitus without complication ([place])   . Dysrhythmia    atrail fibrillation  . GERD (gastroesophageal reflux disease)   . Glaucoma    bilat   . Hyperlipidemia   . Hypertension   . Kidney stone   . Stroke ([place])   . Urinary tract infection   . Wears glasses    Past Surgical History:  Procedure Laterality Date  . CARDIAC CATHETERIZATION N/A 11/25/2016   Procedure: Left Heart Cath and Coronary Angiography;  Surgeon: Lorretta Harp, MD;  Location: [place] [facility];  Service: Cardiovascular;  Laterality: N/A;  . CORONARY ARTERY BYPASS GRAFT N/A 11/28/2016   Procedure: CORONARY ARTERY BYPASS GRAFTING (CABG) x 4;  Surgeon: Melrose Nakayama, MD;  Location: [place];  Service: Open Heart Surgery;  Laterality: N/A;  . CYSTOSCOPY WITH RETROGRADE PYELOGRAM, URETEROSCOPY AND STENT PLACEMENT Left 06/03/2016   Procedure: CYSTOSCOPY WITH LEFT RETROGRADE PYELOGRAM, URETEROSCOPY, REMOVAL OF LEFT NEPHROSTOMY TUBE, INSERTION OF DOUBLE J STENT, LEFT;  Surgeon: Carolan Clines, MD;  [facility];  Service: Urology;  Laterality: Left;  . ENDOVEIN HARVEST OF GREATER SAPHENOUS VEIN Right 11/28/2016   Procedure: ENDOVEIN HARVEST OF GREATER SAPHENOUS VEIN;  Surgeon: Melrose Nakayama, MD;  Location: [place];  Service: Open Heart Surgery;  Laterality: Right;  . EYE SURGERY     cataract removed per left eye   . HOLMIUM LASER APPLICATION Left [id number]   Procedure: HOLMIUM LASER OF STONE ;  Surgeon: Carolan Clines, MD;  [facility];  Service: Urology;  Laterality: Left;  Marland Kitchen MAZE N/A 11/28/2016   Procedure:  MAZE;  Surgeon: Melrose Nakayama, MD;  Location: [place];  Service: Open Heart Surgery;  Laterality: N/A;  . ROTATOR CUFF REPAIR    . TEE WITHOUT CARDIOVERSION N/A 11/28/2016   Procedure: TRANSESOPHAGEAL ECHOCARDIOGRAM (TEE);  Surgeon: Melrose Nakayama, MD;  Location: [place];  Service: Open Heart Surgery;  Laterality: N/A;   Social History   Socioeconomic History  . Marital status: Married    Spouse name: Not on file  . Number of children: Not on file  . Years of education: Not on file  . Highest education level: Not on file  Occupational History  . Not on file  Tobacco Use  . Smoking status: Never Smoker  . Smokeless tobacco: Never Used  Substance and Sexual Activity  . Alcohol use: No  . Drug use: No  . Sexual activity: Not on file  Other Topics Concern  . Not on file  Social History Narrative  . Not on file   Social Determinants of Health   Financial Resource Strain:   . Difficulty of Paying Living Expenses:   Food Insecurity:   . Worried About Charity fundraiser in the Last Year:   . Arboriculturist in the Last Year:   Transportation Needs:   . Film/video editor (Medical):   Marland Kitchen Lack of Transportation (Non-Medical):   Physical Activity:   . Days of Exercise per Week:   .  Minutes of Exercise per Session:   Stress:   . Feeling of Stress :   Social Connections:   . Frequency of Communication with Friends and Family:   . Frequency of Social Gatherings with Friends and Family:   . Attends Religious Services:   . Active Member of Clubs or Organizations:   . Attends Archivist Meetings:   Marland Kitchen Marital Status:    Family History  Problem Relation Age of Onset  . Heart disease Father   . CAD Neg Hx    No Known Allergies Prior to Admission medications   Medication Sig Start Date End Date Taking? Authorizing Provider  apixaban (ELIQUIS) 2.5 MG TABS tablet Take 1 tablet (2.5 mg total) by mouth 2 (two) times daily. 02/04/20  Yes Josue Hector, MD   aspirin EC 81 MG tablet Take 1 tablet (81 mg total) by mouth daily. 11/21/16  Yes Bhagat, Bhavinkumar, PA  carvedilol (COREG) 12.5 MG tablet TAKE 1 TABLET BY MOUTH TWICE DAILY WITH A MEAL 03/01/20  Yes Josue Hector, MD  diltiazem (CARDIZEM CD) 240 MG 24 hr capsule TAKE 1 CAPSULE(240 MG) BY MOUTH DAILY 06/25/19  Yes Josue Hector, MD  gabapentin (NEURONTIN) 100 MG capsule Take 1 capsule (100 mg total) by mouth at bedtime. 05/12/17  Yes Dennie Bible, NP  latanoprost (XALATAN) 0.005 % ophthalmic solution Place 1 drop into both eyes at bedtime.   Yes [provider]  pantoprazole (PROTONIX) 40 MG tablet TAKE 1 TABLET(40 MG) BY MOUTH DAILY 05/01/17  Yes Josue Hector, MD  rosuvastatin (CRESTOR) 5 MG tablet Take 5 mg by mouth daily.    Yes [provider]  senna-docusate (SENOKOT-S) 8.6-50 MG tablet Take 1 tablet by mouth at bedtime as needed for mild constipation. 07/05/17  Yes Sheikh, Omair Latif, DO  sitaGLIPtin (JANUVIA) 25 MG tablet Take 25 mg by mouth daily.    Yes [provider]  SODIUM BICARBONATE PO Take 1 tablet by mouth 2 (two) times daily.   Yes [provider]     Positive ROS: Otherwise negative  All other systems have been reviewed and were otherwise negative with the exception of those mentioned in the HPI and as above.  Physical Exam: Constitutional: Alert, well-appearing, no acute distress Ears: External ears without lesions or tenderness.  Ear canals are clear.  Right T-tube is intact and clear with a clear right TM.  Left TM with serous otitis media.  Using phenol I performed a myringotomy in the left TM and inserted a modified T-tube since she has had previous tubes on that side previously. Nasal: External nose without lesions. Septum midline.. Clear nasal passages with no signs of infection. Oral: Lips and gums without lesions. Tongue and palate mucosa without lesions. Posterior oropharynx clear. Neck: No palpable adenopathy or  masses Respiratory: Breathing comfortably  Skin: No facial/neck lesions or rash noted.  Myringotomy  Date/Time: 03/09/2020 5:04 PM Performed by: Rozetta Nunnery, MD Authorized by: Rozetta Nunnery, MD   Consent:    Consent obtained:  Verbal   Consent given by:  Patient   Risks discussed:  Bleeding and pain   Alternatives discussed:  No treatment Pre-procedure details:    Indications: serous otitis media   Anesthesia:    Anesthesia method:  Topical application   Topical anesthetic:  Phenol Procedure Details:    Location:  Left TM   Tube:  T-tube Findings:    Fluid:  Serous fluid Post-procedure details:  Patient tolerance of procedure:  Tolerated well, no immediate complications    Assessment: 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  Plan: Left M&T with T-tube placement.  Patient tolerated this well with improved hearing. She will follow-up in 2 weeks for recheck.   Radene Journey, MD

## 2020-03-17 DIAGNOSIS — I10 Essential (primary) hypertension: Secondary | ICD-10-CM | POA: Diagnosis not present

## 2020-03-17 DIAGNOSIS — E785 Hyperlipidemia, unspecified: Secondary | ICD-10-CM | POA: Diagnosis not present

## 2020-03-17 DIAGNOSIS — E1100 Type 2 diabetes mellitus with hyperosmolarity without nonketotic hyperglycemic-hyperosmolar coma (NKHHC): Secondary | ICD-10-CM | POA: Diagnosis not present

## 2020-03-20 DIAGNOSIS — E1122 Type 2 diabetes mellitus with diabetic chronic kidney disease: Secondary | ICD-10-CM | POA: Diagnosis not present

## 2020-03-20 DIAGNOSIS — E870 Hyperosmolality and hypernatremia: Secondary | ICD-10-CM | POA: Diagnosis not present

## 2020-03-20 DIAGNOSIS — I129 Hypertensive chronic kidney disease with stage 1 through stage 4 chronic kidney disease, or unspecified chronic kidney disease: Secondary | ICD-10-CM | POA: Diagnosis not present

## 2020-03-20 DIAGNOSIS — N179 Acute kidney failure, unspecified: Secondary | ICD-10-CM | POA: Diagnosis not present

## 2020-03-20 DIAGNOSIS — N200 Calculus of kidney: Secondary | ICD-10-CM | POA: Diagnosis not present

## 2020-03-20 DIAGNOSIS — N1832 Chronic kidney disease, stage 3b: Secondary | ICD-10-CM | POA: Diagnosis not present

## 2020-03-23 ENCOUNTER — Other Ambulatory Visit: Payer: Self-pay

## 2020-03-23 ENCOUNTER — Encounter (INDEPENDENT_AMBULATORY_CARE_PROVIDER_SITE_OTHER): Payer: Self-pay | Admitting: Otolaryngology

## 2020-03-23 ENCOUNTER — Ambulatory Visit (INDEPENDENT_AMBULATORY_CARE_PROVIDER_SITE_OTHER): Payer: Medicare Other | Admitting: Otolaryngology

## 2020-03-23 VITALS — Temp 97.7°F

## 2020-03-23 DIAGNOSIS — Z4889 Encounter for other specified surgical aftercare: Secondary | ICD-10-CM

## 2020-03-23 NOTE — Progress Notes (Signed)
HPI: Audrey Mullen is a 79 y.o. female who presents 14 days s/p placement of left T-tube.  She is doing well with no drainage from the ear.  She still has some difficulty hearing and has underlying bilateral symmetric SNHL.Marland Kitchen   Past Medical History:  Diagnosis Date  . Anginal pain ([place])   . Arthritis   . Coronary artery disease   . Diabetes mellitus without complication ([place])   . Dysrhythmia    atrail fibrillation  . GERD (gastroesophageal reflux disease)   . Glaucoma    bilat   . Hyperlipidemia   . Hypertension   . Kidney stone   . Stroke ([place])   . Urinary tract infection   . Wears glasses    Past Surgical History:  Procedure Laterality Date  . CARDIAC CATHETERIZATION N/A 11/25/2016   Procedure: Left Heart Cath and Coronary Angiography;  Surgeon: Lorretta Harp, MD;  Location: [place] [facility];  Service: Cardiovascular;  Laterality: N/A;  . CORONARY ARTERY BYPASS GRAFT N/A 11/28/2016   Procedure: CORONARY ARTERY BYPASS GRAFTING (CABG) x 4;  Surgeon: Melrose Nakayama, MD;  Location: [place];  Service: Open Heart Surgery;  Laterality: N/A;  . CYSTOSCOPY WITH RETROGRADE PYELOGRAM, URETEROSCOPY AND STENT PLACEMENT Left 06/03/2016   Procedure: CYSTOSCOPY WITH LEFT RETROGRADE PYELOGRAM, URETEROSCOPY, REMOVAL OF LEFT NEPHROSTOMY TUBE, INSERTION OF DOUBLE J STENT, LEFT;  Surgeon: Carolan Clines, MD;  [facility];  Service: Urology;  Laterality: Left;  . ENDOVEIN HARVEST OF GREATER SAPHENOUS VEIN Right 11/28/2016   Procedure: ENDOVEIN HARVEST OF GREATER SAPHENOUS VEIN;  Surgeon: Melrose Nakayama, MD;  Location: [place];  Service: Open Heart Surgery;  Laterality: Right;  . EYE SURGERY     cataract removed per left eye   . HOLMIUM LASER APPLICATION Left [id number]   Procedure: HOLMIUM LASER OF STONE ;  Surgeon: Carolan Clines, MD;  [facility];  Service: Urology;  Laterality: Left;  Marland Kitchen MAZE N/A 11/28/2016   Procedure: MAZE;  Surgeon: Melrose Nakayama, MD;   Location: [place];  Service: Open Heart Surgery;  Laterality: N/A;  . ROTATOR CUFF REPAIR    . TEE WITHOUT CARDIOVERSION N/A 11/28/2016   Procedure: TRANSESOPHAGEAL ECHOCARDIOGRAM (TEE);  Surgeon: Melrose Nakayama, MD;  Location: [place];  Service: Open Heart Surgery;  Laterality: N/A;   Social History   Socioeconomic History  . Marital status: Married    Spouse name: Not on file  . Number of children: Not on file  . Years of education: Not on file  . Highest education level: Not on file  Occupational History  . Not on file  Tobacco Use  . Smoking status: Never Smoker  . Smokeless tobacco: Never Used  Substance and Sexual Activity  . Alcohol use: No  . Drug use: No  . Sexual activity: Not on file  Other Topics Concern  . Not on file  Social History Narrative  . Not on file   Social Determinants of Health   Financial Resource Strain:   . Difficulty of Paying Living Expenses:   Food Insecurity:   . Worried About Charity fundraiser in the Last Year:   . Arboriculturist in the Last Year:   Transportation Needs:   . Film/video editor (Medical):   Marland Kitchen Lack of Transportation (Non-Medical):   Physical Activity:   . Days of Exercise per Week:   . Minutes of Exercise per Session:   Stress:   . Feeling of Stress :  Social Connections:   . Frequency of Communication with Friends and Family:   . Frequency of Social Gatherings with Friends and Family:   . Attends Religious Services:   . Active Member of Clubs or Organizations:   . Attends Archivist Meetings:   Marland Kitchen Marital Status:    Family History  Problem Relation Age of Onset  . Heart disease Father   . CAD Neg Hx    No Known Allergies Prior to Admission medications   Medication Sig Start Date End Date Taking? Authorizing Provider  apixaban (ELIQUIS) 2.5 MG TABS tablet Take 1 tablet (2.5 mg total) by mouth 2 (two) times daily. 02/04/20  Yes Josue Hector, MD  aspirin EC 81 MG tablet Take 1 tablet (81 mg  total) by mouth daily. 11/21/16  Yes Bhagat, Bhavinkumar, PA  carvedilol (COREG) 12.5 MG tablet TAKE 1 TABLET BY MOUTH TWICE DAILY WITH A MEAL 03/01/20  Yes Josue Hector, MD  diltiazem (CARDIZEM CD) 240 MG 24 hr capsule TAKE 1 CAPSULE(240 MG) BY MOUTH DAILY 06/25/19  Yes Josue Hector, MD  gabapentin (NEURONTIN) 100 MG capsule Take 1 capsule (100 mg total) by mouth at bedtime. 05/12/17  Yes Dennie Bible, NP  latanoprost (XALATAN) 0.005 % ophthalmic solution Place 1 drop into both eyes at bedtime.   Yes [provider]  pantoprazole (PROTONIX) 40 MG tablet TAKE 1 TABLET(40 MG) BY MOUTH DAILY 05/01/17  Yes Josue Hector, MD  rosuvastatin (CRESTOR) 5 MG tablet Take 5 mg by mouth daily.    Yes [provider]  senna-docusate (SENOKOT-S) 8.6-50 MG tablet Take 1 tablet by mouth at bedtime as needed for mild constipation. 07/05/17  Yes Sheikh, Omair Latif, DO  sitaGLIPtin (JANUVIA) 25 MG tablet Take 25 mg by mouth daily.    Yes [provider]  SODIUM BICARBONATE PO Take 1 tablet by mouth 2 (two) times daily.   Yes [provider]     Physical Exam: Left T-tube is in good position is patent and dry and the TM is clear.  She also has a T-tube on the right side which is also patent and clear.   Assessment: S/p placement of left T-tube for chronic SOM Bilateral SNHL  Plan: Recommend consideration of obtaining hearing aids. She will follow-up as needed any drainage from her ears or worsening of her hearing.   Radene Journey, MD

## 2020-03-27 ENCOUNTER — Encounter (INDEPENDENT_AMBULATORY_CARE_PROVIDER_SITE_OTHER): Payer: Self-pay

## 2020-05-04 DIAGNOSIS — Z4689 Encounter for fitting and adjustment of other specified devices: Secondary | ICD-10-CM | POA: Diagnosis not present

## 2020-05-17 DIAGNOSIS — I10 Essential (primary) hypertension: Secondary | ICD-10-CM | POA: Diagnosis not present

## 2020-05-17 DIAGNOSIS — E785 Hyperlipidemia, unspecified: Secondary | ICD-10-CM | POA: Diagnosis not present

## 2020-05-17 DIAGNOSIS — E1100 Type 2 diabetes mellitus with hyperosmolarity without nonketotic hyperglycemic-hyperosmolar coma (NKHHC): Secondary | ICD-10-CM | POA: Diagnosis not present

## 2020-05-23 DIAGNOSIS — H353131 Nonexudative age-related macular degeneration, bilateral, early dry stage: Secondary | ICD-10-CM | POA: Diagnosis not present

## 2020-05-23 DIAGNOSIS — H35033 Hypertensive retinopathy, bilateral: Secondary | ICD-10-CM | POA: Diagnosis not present

## 2020-05-23 DIAGNOSIS — H0102A Squamous blepharitis right eye, upper and lower eyelids: Secondary | ICD-10-CM | POA: Diagnosis not present

## 2020-05-23 DIAGNOSIS — H401231 Low-tension glaucoma, bilateral, mild stage: Secondary | ICD-10-CM | POA: Diagnosis not present

## 2020-05-23 DIAGNOSIS — H35372 Puckering of macula, left eye: Secondary | ICD-10-CM | POA: Diagnosis not present

## 2020-05-31 ENCOUNTER — Emergency Department (HOSPITAL_COMMUNITY): Payer: Medicare Other

## 2020-05-31 ENCOUNTER — Inpatient Hospital Stay (HOSPITAL_COMMUNITY): Payer: Medicare Other

## 2020-05-31 ENCOUNTER — Inpatient Hospital Stay (HOSPITAL_COMMUNITY)
Admission: EM | Admit: 2020-05-31 | Discharge: 2020-06-18 | DRG: 023 | Disposition: E | Payer: Medicare Other | Attending: Student in an Organized Health Care Education/Training Program | Admitting: Student in an Organized Health Care Education/Training Program

## 2020-05-31 ENCOUNTER — Encounter (HOSPITAL_COMMUNITY)
Admission: EM | Disposition: E | Payer: Self-pay | Source: Home / Self Care | Attending: Student in an Organized Health Care Education/Training Program

## 2020-05-31 ENCOUNTER — Inpatient Hospital Stay (HOSPITAL_COMMUNITY): Payer: Medicare Other | Admitting: Anesthesiology

## 2020-05-31 DIAGNOSIS — R29718 NIHSS score 18: Secondary | ICD-10-CM | POA: Diagnosis not present

## 2020-05-31 DIAGNOSIS — R404 Transient alteration of awareness: Secondary | ICD-10-CM | POA: Diagnosis not present

## 2020-05-31 DIAGNOSIS — Z515 Encounter for palliative care: Secondary | ICD-10-CM | POA: Diagnosis not present

## 2020-05-31 DIAGNOSIS — I619 Nontraumatic intracerebral hemorrhage, unspecified: Secondary | ICD-10-CM | POA: Diagnosis present

## 2020-05-31 DIAGNOSIS — R531 Weakness: Secondary | ICD-10-CM | POA: Diagnosis present

## 2020-05-31 DIAGNOSIS — J986 Disorders of diaphragm: Secondary | ICD-10-CM | POA: Diagnosis not present

## 2020-05-31 DIAGNOSIS — I63432 Cerebral infarction due to embolism of left posterior cerebral artery: Secondary | ICD-10-CM | POA: Diagnosis not present

## 2020-05-31 DIAGNOSIS — I639 Cerebral infarction, unspecified: Secondary | ICD-10-CM

## 2020-05-31 DIAGNOSIS — J9600 Acute respiratory failure, unspecified whether with hypoxia or hypercapnia: Secondary | ICD-10-CM | POA: Diagnosis not present

## 2020-05-31 DIAGNOSIS — E785 Hyperlipidemia, unspecified: Secondary | ICD-10-CM | POA: Diagnosis not present

## 2020-05-31 DIAGNOSIS — G8191 Hemiplegia, unspecified affecting right dominant side: Secondary | ICD-10-CM | POA: Diagnosis not present

## 2020-05-31 DIAGNOSIS — R29810 Facial weakness: Secondary | ICD-10-CM | POA: Diagnosis present

## 2020-05-31 DIAGNOSIS — N1832 Chronic kidney disease, stage 3b: Secondary | ICD-10-CM | POA: Diagnosis not present

## 2020-05-31 DIAGNOSIS — R6889 Other general symptoms and signs: Secondary | ICD-10-CM | POA: Diagnosis not present

## 2020-05-31 DIAGNOSIS — Z7901 Long term (current) use of anticoagulants: Secondary | ICD-10-CM

## 2020-05-31 DIAGNOSIS — Z7984 Long term (current) use of oral hypoglycemic drugs: Secondary | ICD-10-CM

## 2020-05-31 DIAGNOSIS — I63412 Cerebral infarction due to embolism of left middle cerebral artery: Principal | ICD-10-CM | POA: Diagnosis present

## 2020-05-31 DIAGNOSIS — I129 Hypertensive chronic kidney disease with stage 1 through stage 4 chronic kidney disease, or unspecified chronic kidney disease: Secondary | ICD-10-CM | POA: Diagnosis not present

## 2020-05-31 DIAGNOSIS — I10 Essential (primary) hypertension: Secondary | ICD-10-CM | POA: Diagnosis not present

## 2020-05-31 DIAGNOSIS — I63532 Cerebral infarction due to unspecified occlusion or stenosis of left posterior cerebral artery: Secondary | ICD-10-CM | POA: Diagnosis not present

## 2020-05-31 DIAGNOSIS — Z66 Do not resuscitate: Secondary | ICD-10-CM | POA: Diagnosis not present

## 2020-05-31 DIAGNOSIS — Z951 Presence of aortocoronary bypass graft: Secondary | ICD-10-CM | POA: Diagnosis not present

## 2020-05-31 DIAGNOSIS — I4891 Unspecified atrial fibrillation: Secondary | ICD-10-CM | POA: Diagnosis not present

## 2020-05-31 DIAGNOSIS — I6602 Occlusion and stenosis of left middle cerebral artery: Secondary | ICD-10-CM | POA: Diagnosis not present

## 2020-05-31 DIAGNOSIS — R4701 Aphasia: Secondary | ICD-10-CM | POA: Diagnosis not present

## 2020-05-31 DIAGNOSIS — I69398 Other sequelae of cerebral infarction: Secondary | ICD-10-CM

## 2020-05-31 DIAGNOSIS — T80818A Extravasation of other vesicant agent, initial encounter: Secondary | ICD-10-CM | POA: Diagnosis not present

## 2020-05-31 DIAGNOSIS — I63233 Cerebral infarction due to unspecified occlusion or stenosis of bilateral carotid arteries: Secondary | ICD-10-CM | POA: Diagnosis not present

## 2020-05-31 DIAGNOSIS — Z9289 Personal history of other medical treatment: Secondary | ICD-10-CM

## 2020-05-31 DIAGNOSIS — Z87442 Personal history of urinary calculi: Secondary | ICD-10-CM

## 2020-05-31 DIAGNOSIS — I618 Other nontraumatic intracerebral hemorrhage: Secondary | ICD-10-CM | POA: Diagnosis not present

## 2020-05-31 DIAGNOSIS — Z4682 Encounter for fitting and adjustment of non-vascular catheter: Secondary | ICD-10-CM | POA: Diagnosis not present

## 2020-05-31 DIAGNOSIS — I609 Nontraumatic subarachnoid hemorrhage, unspecified: Secondary | ICD-10-CM | POA: Diagnosis present

## 2020-05-31 DIAGNOSIS — Z8249 Family history of ischemic heart disease and other diseases of the circulatory system: Secondary | ICD-10-CM

## 2020-05-31 DIAGNOSIS — N183 Chronic kidney disease, stage 3 unspecified: Secondary | ICD-10-CM | POA: Diagnosis not present

## 2020-05-31 DIAGNOSIS — H409 Unspecified glaucoma: Secondary | ICD-10-CM | POA: Diagnosis present

## 2020-05-31 DIAGNOSIS — Z743 Need for continuous supervision: Secondary | ICD-10-CM | POA: Diagnosis not present

## 2020-05-31 DIAGNOSIS — I959 Hypotension, unspecified: Secondary | ICD-10-CM | POA: Diagnosis present

## 2020-05-31 DIAGNOSIS — I651 Occlusion and stenosis of basilar artery: Secondary | ICD-10-CM | POA: Diagnosis not present

## 2020-05-31 DIAGNOSIS — E1165 Type 2 diabetes mellitus with hyperglycemia: Secondary | ICD-10-CM | POA: Diagnosis present

## 2020-05-31 DIAGNOSIS — I2510 Atherosclerotic heart disease of native coronary artery without angina pectoris: Secondary | ICD-10-CM | POA: Diagnosis present

## 2020-05-31 DIAGNOSIS — E1122 Type 2 diabetes mellitus with diabetic chronic kidney disease: Secondary | ICD-10-CM | POA: Diagnosis not present

## 2020-05-31 DIAGNOSIS — I63512 Cerebral infarction due to unspecified occlusion or stenosis of left middle cerebral artery: Secondary | ICD-10-CM | POA: Diagnosis not present

## 2020-05-31 DIAGNOSIS — Z20822 Contact with and (suspected) exposure to covid-19: Secondary | ICD-10-CM | POA: Diagnosis not present

## 2020-05-31 DIAGNOSIS — K219 Gastro-esophageal reflux disease without esophagitis: Secondary | ICD-10-CM | POA: Diagnosis present

## 2020-05-31 DIAGNOSIS — Z79899 Other long term (current) drug therapy: Secondary | ICD-10-CM

## 2020-05-31 DIAGNOSIS — J9690 Respiratory failure, unspecified, unspecified whether with hypoxia or hypercapnia: Secondary | ICD-10-CM

## 2020-05-31 DIAGNOSIS — Z7982 Long term (current) use of aspirin: Secondary | ICD-10-CM

## 2020-05-31 HISTORY — PX: IR PERCUTANEOUS ART THROMBECTOMY/INFUSION INTRACRANIAL INC DIAG ANGIO: IMG6087

## 2020-05-31 HISTORY — PX: RADIOLOGY WITH ANESTHESIA: SHX6223

## 2020-05-31 HISTORY — PX: IR CT HEAD LTD: IMG2386

## 2020-05-31 HISTORY — PX: IR ANGIOGRAM FOLLOW UP STUDY: IMG697

## 2020-05-31 HISTORY — PX: IR TRANSCATH/EMBOLIZ: IMG695

## 2020-05-31 HISTORY — PX: IR NEURO EACH ADD'L AFTER BASIC UNI LEFT (MS): IMG5373

## 2020-05-31 LAB — COMPREHENSIVE METABOLIC PANEL
ALT: 16 U/L (ref 0–44)
AST: 25 U/L (ref 15–41)
Albumin: 4.5 g/dL (ref 3.5–5.0)
Alkaline Phosphatase: 83 U/L (ref 38–126)
Anion gap: 13 (ref 5–15)
BUN: 20 mg/dL (ref 8–23)
CO2: 22 mmol/L (ref 22–32)
Calcium: 9.5 mg/dL (ref 8.9–10.3)
Chloride: 104 mmol/L (ref 98–111)
Creatinine, Ser: 1.24 mg/dL — ABNORMAL HIGH (ref 0.44–1.00)
GFR calc Af Amer: 47 mL/min — ABNORMAL LOW (ref >60)
GFR calc non Af Amer: 41 mL/min — ABNORMAL LOW (ref >60)
Glucose, Bld: 188 mg/dL — ABNORMAL HIGH (ref 70–99)
Potassium: 4.3 mmol/L (ref 3.5–5.1)
Sodium: 139 mmol/L (ref 135–145)
Total Bilirubin: 0.8 mg/dL (ref 0.3–1.2)
Total Protein: 7.4 g/dL (ref 6.5–8.1)

## 2020-05-31 LAB — POCT I-STAT 7, (LYTES, BLD GAS, ICA,H+H)
Acid-base deficit: 4 mmol/L — ABNORMAL HIGH (ref 0.0–2.0)
Bicarbonate: 20.3 mmol/L (ref 20.0–28.0)
Calcium, Ion: 1.08 mmol/L — ABNORMAL LOW (ref 1.15–1.40)
HCT: 35 % — ABNORMAL LOW (ref 36.0–46.0)
Hemoglobin: 11.9 g/dL — ABNORMAL LOW (ref 12.0–15.0)
O2 Saturation: 100 %
Patient temperature: 97.4
Potassium: 3 mmol/L — ABNORMAL LOW (ref 3.5–5.1)
Sodium: 143 mmol/L (ref 135–145)
TCO2: 21 mmol/L — ABNORMAL LOW (ref 22–32)
pCO2 arterial: 32.2 mmHg (ref 32.0–48.0)
pH, Arterial: 7.405 (ref 7.350–7.450)
pO2, Arterial: 516 mmHg — ABNORMAL HIGH (ref 83.0–108.0)

## 2020-05-31 LAB — PROTIME-INR
INR: 1.1 (ref 0.8–1.2)
Prothrombin Time: 13.3 seconds (ref 11.4–15.2)

## 2020-05-31 LAB — URINALYSIS, ROUTINE W REFLEX MICROSCOPIC
Bacteria, UA: NONE SEEN
Bilirubin Urine: NEGATIVE
Glucose, UA: 150 mg/dL — AB
Ketones, ur: 5 mg/dL — AB
Leukocytes,Ua: NEGATIVE
Nitrite: NEGATIVE
Protein, ur: NEGATIVE mg/dL
Specific Gravity, Urine: 1.024 (ref 1.005–1.030)
pH: 7 (ref 5.0–8.0)

## 2020-05-31 LAB — I-STAT CHEM 8, ED
BUN: 24 mg/dL — ABNORMAL HIGH (ref 8–23)
Calcium, Ion: 0.98 mmol/L — ABNORMAL LOW (ref 1.15–1.40)
Chloride: 106 mmol/L (ref 98–111)
Creatinine, Ser: 1.2 mg/dL — ABNORMAL HIGH (ref 0.44–1.00)
Glucose, Bld: 192 mg/dL — ABNORMAL HIGH (ref 70–99)
HCT: 42 % (ref 36.0–46.0)
Hemoglobin: 14.3 g/dL (ref 12.0–15.0)
Potassium: 3.9 mmol/L (ref 3.5–5.1)
Sodium: 141 mmol/L (ref 135–145)
TCO2: 23 mmol/L (ref 22–32)

## 2020-05-31 LAB — CBC
HCT: 40.5 % (ref 36.0–46.0)
Hemoglobin: 12.3 g/dL (ref 12.0–15.0)
MCH: 26.9 pg (ref 26.0–34.0)
MCHC: 30.4 g/dL (ref 30.0–36.0)
MCV: 88.6 fL (ref 80.0–100.0)
Platelets: 157 10*3/uL (ref 150–400)
RBC: 4.57 MIL/uL (ref 3.87–5.11)
RDW: 13.7 % (ref 11.5–15.5)
WBC: 10.5 10*3/uL (ref 4.0–10.5)
nRBC: 0 % (ref 0.0–0.2)

## 2020-05-31 LAB — DIFFERENTIAL
Abs Immature Granulocytes: 0.06 10*3/uL (ref 0.00–0.07)
Basophils Absolute: 0 10*3/uL (ref 0.0–0.1)
Basophils Relative: 0 %
Eosinophils Absolute: 0 10*3/uL (ref 0.0–0.5)
Eosinophils Relative: 0 %
Immature Granulocytes: 1 %
Lymphocytes Relative: 9 %
Lymphs Abs: 0.9 10*3/uL (ref 0.7–4.0)
Monocytes Absolute: 0.3 10*3/uL (ref 0.1–1.0)
Monocytes Relative: 3 %
Neutro Abs: 9.2 10*3/uL — ABNORMAL HIGH (ref 1.7–7.7)
Neutrophils Relative %: 87 %

## 2020-05-31 LAB — APTT: aPTT: 23 seconds — ABNORMAL LOW (ref 24–36)

## 2020-05-31 LAB — RAPID URINE DRUG SCREEN, HOSP PERFORMED
Amphetamines: NOT DETECTED
Barbiturates: NOT DETECTED
Benzodiazepines: NOT DETECTED
Cocaine: NOT DETECTED
Opiates: NOT DETECTED
Tetrahydrocannabinol: NOT DETECTED

## 2020-05-31 LAB — CBG MONITORING, ED: Glucose-Capillary: 172 mg/dL — ABNORMAL HIGH (ref 70–99)

## 2020-05-31 LAB — ETHANOL: Alcohol, Ethyl (B): <10 mg/dL (ref <10)

## 2020-05-31 LAB — SARS CORONAVIRUS 2 BY RT PCR (HOSPITAL ORDER, PERFORMED IN ~~LOC~~ HOSPITAL LAB): SARS Coronavirus 2: NEGATIVE

## 2020-05-31 LAB — MRSA PCR SCREENING: MRSA by PCR: NEGATIVE

## 2020-05-31 SURGERY — IR WITH ANESTHESIA
Anesthesia: General

## 2020-05-31 MED ORDER — POLYVINYL ALCOHOL 1.4 % OP SOLN
1.0000 [drp] | Freq: Four times a day (QID) | OPHTHALMIC | Status: DC | PRN
  Filled 2020-05-31: qty 15

## 2020-05-31 MED ORDER — GLYCOPYRROLATE 1 MG PO TABS
1.0000 mg | ORAL_TABLET | ORAL | Status: DC | PRN
  Filled 2020-05-31: qty 1

## 2020-05-31 MED ORDER — FENTANYL CITRATE (PF) 250 MCG/5ML IJ SOLN
INTRAMUSCULAR | Status: DC | PRN
  Administered 2020-05-31: 100 ug via INTRAVENOUS

## 2020-05-31 MED ORDER — LIDOCAINE 2% (20 MG/ML) 5 ML SYRINGE
INTRAMUSCULAR | Status: DC | PRN
  Administered 2020-05-31: 100 mg via INTRAVENOUS

## 2020-05-31 MED ORDER — SODIUM CHLORIDE 0.9 % IV SOLN: 250.0000 mL | INTRAVENOUS | Status: DC

## 2020-05-31 MED ORDER — FENTANYL CITRATE (PF) 100 MCG/2ML IJ SOLN: 25.0000 ug | INTRAMUSCULAR | Status: DC | PRN

## 2020-05-31 MED ORDER — INSULIN ASPART 100 UNIT/ML ~~LOC~~ SOLN
0.0000 [IU] | SUBCUTANEOUS | Status: DC
  Administered 2020-05-31: 2 [IU] via SUBCUTANEOUS

## 2020-05-31 MED ORDER — ROCURONIUM BROMIDE 10 MG/ML (PF) SYRINGE
PREFILLED_SYRINGE | INTRAVENOUS | Status: DC | PRN
  Administered 2020-05-31: 20 mg via INTRAVENOUS
  Administered 2020-05-31: 50 mg via INTRAVENOUS
  Administered 2020-05-31: 20 mg via INTRAVENOUS
  Administered 2020-05-31: 30 mg via INTRAVENOUS

## 2020-05-31 MED ORDER — CHLORHEXIDINE GLUCONATE 0.12% ORAL RINSE (MEDLINE KIT)
15.0000 mL | Freq: Two times a day (BID) | OROMUCOSAL | Status: DC
  Administered 2020-05-31: 15 mL via OROMUCOSAL

## 2020-05-31 MED ORDER — CEFAZOLIN SODIUM-DEXTROSE 2-3 GM-%(50ML) IV SOLR
INTRAVENOUS | Status: DC | PRN
  Administered 2020-05-31: 2 g via INTRAVENOUS

## 2020-05-31 MED ORDER — TICAGRELOR 90 MG PO TABS
ORAL_TABLET | ORAL | Status: AC
  Filled 2020-05-31: qty 2

## 2020-05-31 MED ORDER — ACETAMINOPHEN 160 MG/5ML PO SOLN: 650.0000 mg | ORAL | Status: DC | PRN

## 2020-05-31 MED ORDER — IOHEXOL 240 MG/ML SOLN
150.0000 mL | Freq: Once | INTRAMUSCULAR | Status: AC | PRN
  Administered 2020-05-31: 100 mL via INTRA_ARTERIAL

## 2020-05-31 MED ORDER — SENNOSIDES-DOCUSATE SODIUM 8.6-50 MG PO TABS: 1.0000 | ORAL_TABLET | Freq: Every evening | ORAL | Status: DC | PRN

## 2020-05-31 MED ORDER — SODIUM CHLORIDE 0.9 % IV SOLN: INTRAVENOUS | Status: DC

## 2020-05-31 MED ORDER — HYDRALAZINE HCL 20 MG/ML IJ SOLN: 5.0000 mg | INTRAMUSCULAR | Status: DC | PRN

## 2020-05-31 MED ORDER — PANTOPRAZOLE SODIUM 40 MG IV SOLR: 40.0000 mg | Freq: Every day | INTRAVENOUS | Status: DC

## 2020-05-31 MED ORDER — PHENYLEPHRINE HCL-NACL 10-0.9 MG/250ML-% IV SOLN
INTRAVENOUS | Status: DC | PRN
  Administered 2020-05-31: 25 ug/min via INTRAVENOUS

## 2020-05-31 MED ORDER — STROKE: EARLY STAGES OF RECOVERY BOOK: Freq: Once | Status: DC

## 2020-05-31 MED ORDER — IOHEXOL 350 MG/ML SOLN
75.0000 mL | Freq: Once | INTRAVENOUS | Status: AC | PRN
  Administered 2020-05-31: 75 mL via INTRAVENOUS

## 2020-05-31 MED ORDER — GLYCOPYRROLATE 0.2 MG/ML IJ SOLN: 0.2000 mg | INTRAMUSCULAR | Status: DC | PRN

## 2020-05-31 MED ORDER — CLEVIDIPINE BUTYRATE 0.5 MG/ML IV EMUL
INTRAVENOUS | Status: AC
  Filled 2020-05-31: qty 50

## 2020-05-31 MED ORDER — TIROFIBAN HCL IN NACL 5-0.9 MG/100ML-% IV SOLN
INTRAVENOUS | Status: AC
  Filled 2020-05-31: qty 100

## 2020-05-31 MED ORDER — ORAL CARE MOUTH RINSE: 15.0000 mL | OROMUCOSAL | Status: DC

## 2020-05-31 MED ORDER — PHENYLEPHRINE HCL-NACL 10-0.9 MG/250ML-% IV SOLN
25.0000 ug/min | INTRAVENOUS | Status: DC
  Administered 2020-05-31: 25 ug/min via INTRAVENOUS
  Filled 2020-05-31 (×2): qty 250

## 2020-05-31 MED ORDER — ACETAMINOPHEN 650 MG RE SUPP: 650.0000 mg | Freq: Four times a day (QID) | RECTAL | Status: DC | PRN

## 2020-05-31 MED ORDER — ASPIRIN 81 MG PO CHEW
CHEWABLE_TABLET | ORAL | Status: AC
  Filled 2020-05-31: qty 1

## 2020-05-31 MED ORDER — SUCCINYLCHOLINE CHLORIDE 20 MG/ML IJ SOLN
INTRAMUSCULAR | Status: DC | PRN
Start: 2020-05-31 — End: 2020-05-31
  Administered 2020-05-31: 120 mg via INTRAVENOUS

## 2020-05-31 MED ORDER — IOHEXOL 240 MG/ML SOLN
INTRAMUSCULAR | Status: AC
  Filled 2020-05-31: qty 200

## 2020-05-31 MED ORDER — ACETAMINOPHEN 325 MG PO TABS: 650.0000 mg | ORAL_TABLET | Freq: Four times a day (QID) | ORAL | Status: DC | PRN

## 2020-05-31 MED ORDER — IOHEXOL 300 MG/ML  SOLN
50.0000 mL | Freq: Once | INTRAMUSCULAR | Status: AC | PRN
  Administered 2020-05-31: 20 mL via INTRA_ARTERIAL

## 2020-05-31 MED ORDER — VERAPAMIL HCL 2.5 MG/ML IV SOLN
INTRAVENOUS | Status: AC
  Filled 2020-05-31: qty 2

## 2020-05-31 MED ORDER — POLYETHYLENE GLYCOL 3350 17 G PO PACK: 17.0000 g | PACK | Freq: Every day | ORAL | Status: DC

## 2020-05-31 MED ORDER — CEFAZOLIN SODIUM-DEXTROSE 2-4 GM/100ML-% IV SOLN
INTRAVENOUS | Status: AC
  Filled 2020-05-31: qty 100

## 2020-05-31 MED ORDER — FENTANYL CITRATE (PF) 100 MCG/2ML IJ SOLN
INTRAMUSCULAR | Status: AC
  Filled 2020-05-31: qty 2

## 2020-05-31 MED ORDER — PROPOFOL 1000 MG/100ML IV EMUL: 0.0000 ug/kg/min | INTRAVENOUS | Status: DC

## 2020-05-31 MED ORDER — DIPHENHYDRAMINE HCL 50 MG/ML IJ SOLN: 25.0000 mg | INTRAMUSCULAR | Status: DC | PRN

## 2020-05-31 MED ORDER — CLEVIDIPINE BUTYRATE 0.5 MG/ML IV EMUL
INTRAVENOUS | Status: DC | PRN
  Administered 2020-05-31: 1 mg/h via INTRAVENOUS

## 2020-05-31 MED ORDER — ACETAMINOPHEN 325 MG PO TABS: 650.0000 mg | ORAL_TABLET | ORAL | Status: DC | PRN

## 2020-05-31 MED ORDER — LABETALOL HCL 5 MG/ML IV SOLN
0.5000 mg/min | Status: DC
  Filled 2020-05-31: qty 80

## 2020-05-31 MED ORDER — ACETAMINOPHEN 650 MG RE SUPP: 650.0000 mg | RECTAL | Status: DC | PRN

## 2020-05-31 MED ORDER — DEXTROSE 5 % IV SOLN: INTRAVENOUS | Status: DC

## 2020-05-31 MED ORDER — EPTIFIBATIDE 20 MG/10ML IV SOLN
INTRAVENOUS | Status: AC
  Filled 2020-05-31: qty 10

## 2020-05-31 MED ORDER — NITROGLYCERIN 1 MG/10 ML FOR IR/CATH LAB
INTRA_ARTERIAL | Status: AC
  Filled 2020-05-31: qty 10

## 2020-05-31 MED ORDER — PROPOFOL 10 MG/ML IV BOLUS
INTRAVENOUS | Status: DC | PRN
  Administered 2020-05-31: 25 ug/kg/min via INTRAVENOUS
  Administered 2020-05-31: 30 mg via INTRAVENOUS
  Administered 2020-05-31: 110 mg via INTRAVENOUS

## 2020-05-31 MED ORDER — CLOPIDOGREL BISULFATE 300 MG PO TABS
ORAL_TABLET | ORAL | Status: AC
  Filled 2020-05-31: qty 1

## 2020-05-31 MED ORDER — DOCUSATE SODIUM 50 MG/5ML PO LIQD: 100.0000 mg | Freq: Two times a day (BID) | ORAL | Status: DC

## 2020-05-31 NOTE — Progress Notes (Signed)
Pt intubated, sedated and under the care of anesthesia. unable to assess NIH at this time.

## 2020-05-31 NOTE — Anesthesia Procedure Notes (Signed)
Arterial Line Insertion Start/End07/17/2021 1:55 PM, 05/31/2020 2:03 PM Performed by: Griffin Dakin, CRNA, CRNA  Preanesthetic checklist: patient identified, IV checked, site marked, risks and benefits discussed, surgical consent, monitors and equipment checked, pre-op evaluation, timeout performed and anesthesia consent Left, radial was placed Catheter size: 20 G Hand hygiene performed  and maximum sterile barriers used  Allen's test indicative of satisfactory collateral circulation Attempts: 1 Procedure performed without using ultrasound guided technique. Following insertion, dressing applied and Biopatch. Post procedure assessment: normal  Patient tolerated the procedure well with no immediate complications.

## 2020-05-31 NOTE — Plan of Care (Signed)
Status post neuro interventional radiology procedure. Basilar artery TICI 3 recanalization. Left M1, first pass with partial recanalization but contrast extravasation noted.  Vessel sacrifice performed with coil and glue. Poor exam after holding sedation.  To do: -Stat head CT -Dr. Ladean Raya has spoken to the son and made him aware that the patient is not doing well in the procedure had the complications as above.  -- Amie Portland, MD [facility]

## 2020-05-31 NOTE — H&P (Signed)
Stroke neurology history and physical  CC: Unresponsiveness  History is obtained from: Son Audrey Mullen, chart  HPI: Audrey Mullen is a 79 y.o. female she has a past medical history of coronary artery disease, atrial fibrillation on anticoagulation, prior strokes with residual generalized weakness-son refused lateralized weakness, requiring cane to walk but able to do all ADLs, last seen normal around 5 AM today when the son left for work and spoke with her.  Later on she was found unresponsive and not following commands in bed.  EMS brought her as altered mental status.  Code stroke was not activated due to no focality on their initial screening exam.  She was examined in the emergency room when it was noted that she had some right sided weakness compared to the left in addition to her being not following commands. She was examined in the CAT scanner-detailed exam below. CT scan showed an aspects that looked initially like a 7 but on further review with the radiologist was actually lower-a 5 out of 6. CTA head and neck was obtained after some technical difficulties with obtaining images/transferring images as well as some delay with getting a line and it revealed multiple abnormalities including a distal basilar occlusion, distal M1 and proximal M2 occlusion on the left as well as multiple other stenoses. Patient was unable to provide any history. Son was able to provide some history over the phone  LKW: 5 AM on 05/20/2020 tpa given?: no, on anticoagulation, also outside the window Premorbid modified Rankin scale (mRS):2  OZH:YQMVHQ to obtain due to altered mental status.   Past Medical History:  Diagnosis Date  . Anginal pain ([place])   . Arthritis   . Coronary artery disease   . Diabetes mellitus without complication ([place])   . Dysrhythmia    atrail fibrillation  . GERD (gastroesophageal reflux disease)   . Glaucoma    bilat   . Hyperlipidemia   . Hypertension   . Kidney  stone   . Stroke ([place])   . Urinary tract infection   . Wears glasses      Family History  Problem Relation Age of Onset  . Heart disease Father   . CAD Neg Hx    Social History:   reports that she has never smoked. She has never used smokeless tobacco. She reports that she does not drink alcohol and does not use drugs.  Medications  Current Facility-Administered Medications:  .   stroke: mapping our early stages of recovery book, , Does not apply, Once, Amie Portland, MD .  0.9 %  sodium chloride infusion, , Intravenous, Continuous, Amie Portland, MD .  acetaminophen (TYLENOL) tablet 650 mg, 650 mg, Oral, Q4H PRN **OR** acetaminophen (TYLENOL) 160 MG/5ML solution 650 mg, 650 mg, Per Tube, Q4H PRN **OR** acetaminophen (TYLENOL) suppository 650 mg, 650 mg, Rectal, Q4H PRN, Amie Portland, MD .  aspirin 81 MG chewable tablet, , , ,  .  clopidogrel (PLAVIX) 300 MG tablet, , , ,  .  eptifibatide (INTEGRILIN) 20 MG/10ML injection, , , ,  .  iohexol (OMNIPAQUE) 240 MG/ML injection, , , ,  .  nitroGLYCERIN 100 mcg/mL intra-arterial injection, , , ,  .  senna-docusate (Senokot-S) tablet 1 tablet, 1 tablet, Oral, QHS PRN, Amie Portland, MD .  ticagrelor (BRILINTA) 90 MG tablet, , , ,  .  tirofiban (AGGRASTAT) 5-0.9 MG/100ML-% injection, , , ,  .  verapamil (ISOPTIN) 2.5 MG/ML injection, , , ,  .  verapamil (ISOPTIN) 2.5  MG/ML injection, , , ,   Current Outpatient Medications:  .  apixaban (ELIQUIS) 2.5 MG TABS tablet, Take 1 tablet (2.5 mg total) by mouth 2 (two) times daily., Disp: 60 tablet, Rfl: 3 .  carvedilol (COREG) 12.5 MG tablet, TAKE 1 TABLET BY MOUTH TWICE DAILY WITH A MEAL (Patient taking differently: Take 12.5 mg by mouth 2 (two) times daily with a meal. ), Disp: 60 tablet, Rfl: 6 .  diltiazem (CARDIZEM CD) 240 MG 24 hr capsule, TAKE 1 CAPSULE(240 MG) BY MOUTH DAILY (Patient taking differently: Take 240 mg by mouth daily. ), Disp: 90 capsule, Rfl: 3 .  rosuvastatin (CRESTOR) 5 MG  tablet, Take 5 mg by mouth daily. , Disp: , Rfl:  .  sitaGLIPtin (JANUVIA) 25 MG tablet, Take 25 mg by mouth daily. , Disp: , Rfl:  .  aspirin EC 81 MG tablet, Take 1 tablet (81 mg total) by mouth daily., Disp: 90 tablet, Rfl: 3 .  gabapentin (NEURONTIN) 100 MG capsule, Take 1 capsule (100 mg total) by mouth at bedtime. (Patient not taking: Reported on 06/11/2020), Disp: 90 capsule, Rfl: 1 .  latanoprost (XALATAN) 0.005 % ophthalmic solution, Place 1 drop into both eyes at bedtime. (Patient not taking: Reported on 05/26/2020), Disp: , Rfl:  .  pantoprazole (PROTONIX) 40 MG tablet, TAKE 1 TABLET(40 MG) BY MOUTH DAILY (Patient not taking: Reported on 05/23/2020), Disp: 30 tablet, Rfl: 11 .  senna-docusate (SENOKOT-S) 8.6-50 MG tablet, Take 1 tablet by mouth at bedtime as needed for mild constipation. (Patient not taking: Reported on 06/13/2020), Disp: 30 tablet, Rfl: 0 .  SODIUM BICARBONATE PO, Take 1 tablet by mouth 2 (two) times daily. (Patient not taking: Reported on 06/17/2020), Disp: , Rfl:    Exam: Current vital signs: BP (!) 166/75 (BP Location: Left Arm)   Pulse 71   Resp 17   SpO2 98%  Vital signs in last 24 hours: Pulse Rate:  [66-75] 71 (07/14 1245) Resp:  [16-21] 17 (07/14 1245) BP: (156-170)/(65-75) 166/75 (07/14 1245) SpO2:  [97 %-100 %] 98 % (07/14 1245) General: She is drowsy, opens eyes to voice but does not follow commands. HEENT: Normocephalic atraumatic Chest: Median sternotomy incision, clear to auscultation CVS: Irregularly irregular Extremities warm well perfused Neurological exam She is drowsy, opens eyes to voice, prefers left. Does not follow commands. She is completely mute. Cranial nerves: Pupils are equal round reactive to light, she has a leftward gaze preference but is able to see up to midline, rises difficult to assess visual fields as she intermittently blinks from both sides but there seems to be less blinking from the right, flattening of the right  nasolabial fold in comparison to the left Motor exam: She spontaneously moves her left arm right leg and left leg but does not move her right arm spontaneously.  Upon lifting her right arm passively, it drifts to the bed.  Upon lifting her left arm passively, she can hold it up for a good 8 to 10 seconds without a drift.  Both her lower extremities, drift to the bed. Sensory exam: Weaker withdrawal to noxious stimulation on the right Coordination cannot be assessed due to her mentation NIH stroke scale 1a Level of Conscious.: 1 1b LOC Questions: 2 1c LOC Commands: 2 2 Best Gaze: 1 3 Visual: 1 4 Facial Palsy: 1 5a Motor Arm - left: 0 5b Motor Arm - Right: 1 6a Motor Leg - Left: 1 6b Motor Leg - Right: 1 7 Limb Ataxia: 0  8 Sensory: 1 9 Best Language: 3 10 Dysarthria: 2 11 Extinct. and Inatten.: 1 TOTAL: 18  Labs I have reviewed labs in epic and the results pertinent to this consultation are:  CBC    Component Value Date/Time   WBC 10.5 05/19/2020 1201   RBC 4.57 06/15/2020 1201   HGB 14.3 06/07/2020 1213   HGB 10.9 (L) 11/21/2016 0913   HCT 42.0 06/06/2020 1213   HCT 33.8 (L) 11/21/2016 0913   PLT 157 05/26/2020 1201   PLT 181 11/21/2016 0913   MCV 88.6 06/08/2020 1201   MCV 84 11/21/2016 0913   MCH 26.9 06/16/2020 1201   MCHC 30.4 05/23/2020 1201   RDW 13.7 05/26/2020 1201   RDW 16.8 (H) 11/21/2016 0913   LYMPHSABS 0.9 06/02/2020 1201   LYMPHSABS 1.4 11/21/2016 0913   MONOABS 0.3 05/19/2020 1201   EOSABS 0.0 05/24/2020 1201   EOSABS 0.0 11/21/2016 0913   BASOSABS 0.0 05/19/2020 1201   BASOSABS 0.0 11/21/2016 0913    CMP     Component Value Date/Time   NA 141 05/30/2020 1213   NA 141 11/21/2016 0913   K 3.9 05/22/2020 1213   CL 106 05/30/2020 1213   CO2 22 06/10/2020 1201   GLUCOSE 192 (H) 06/02/2020 1213   BUN 24 (H) 05/22/2020 1213   BUN 25 11/21/2016 0913   CREATININE 1.20 (H) 06/07/2020 1213   CREATININE 1.30 (H) 05/03/2016 1011   CALCIUM 9.5  06/03/2020 1201   PROT 7.4 06/12/2020 1201   ALBUMIN 4.5 06/12/2020 1201   AST 25 05/18/2020 1201   ALT 16 05/21/2020 1201   ALKPHOS 83 06/10/2020 1201   BILITOT 0.8 06/14/2020 1201   GFRNONAA 41 (L) 06/14/2020 1201   GFRAA 47 (L) 05/31/2020 1201   Imaging I have reviewed the images obtained:  CT-scan of the brain i-early changes of acute ischemic stroke in the left hemisphere mostly in the left MCA territory.  Possible hyperdense basilar and hyperdense left M1. CT angio head and neck-high-grade stenosis within the proximal left ICA 70%, occlusion of the distal left M1 with some reconstitution within M2 and more distal left MCA branches although asymmetrically decreased as compared to the right.  The proximal A1 on the left is also poorly visualized.  There is occlusion of the distal basilar artery and the thrombus extends to the proximal P1's bilaterally.  There is some reconstitution of flow within the distal P1 and more distal posterior cerebral artery branches bilaterally.  There are sites of moderate stenosis with intracranial ICAs bilaterally. CT perfusion shows a 51 mL core, with mismatch of 123 cc in the left MCA/PCA territories.  Due to technical difficulties with the PACS and network system in the hospital, images were reviewed at the [place] station with Dr. Armandina Gemma and Dr. Ladean Raya, at the time of acquisition of the images.   Assessment:  79 year old with above past medical history brought in for decreased responsiveness.  In the emergency room noted to have some right-sided weakness. Code stroke activated for unresponsiveness and altered mental status and right-sided weakness. On my evaluation, she had right hemiparesis, left gaze preference as well as her level of consciousness was lower than what you would expect with a pure MCA stroke. CT head showed early changes to evolving left MCA stroke with possible hyperdense basilar artery and hyperdense left M1. CTA head  and neck revealed multiple occlusions and stenosis: Most prominently distal left M1 occlusion and distal basilar occlusion that extended into  bilateral P1's. Case was discussed with neuroradiology and neuro interventional radiology for consideration of endovascular intervention. She is a poor candidate for left MCA revascularization due to aspects being 5 and a pretty decent size score already established although still within guidelines define size of less than 70 cc.  She was taken for stat MRI-that did not show any brainstem stroke. The more pressing issue is the basilar occlusion which could be fatal if not revascularized.  For now, there is no evidence of brainstem infarction. She is also on anticoagulation I discussed these findings in detail with the son-explained that not doing a procedure would lead to a large stroke and the basilar occlusion might cause a brainstem stroke which might even be fatal.  The other option is to treated with endovascular thrombectomy, which also has much higher chances of bleed in this case because of her CT scan already showing an aspects of 5 in the left MCA territory, and her being on anticoagulation. Son expressed very clearly that the patient would not want to be vegetative and if there is anything that can be done to give her a chance of meaningful survival, we should try that.  If that does not work, then they would not want to pursue aggressive measures afterwards.  Consent was obtained from the son over the phone, witnessed by the stroke response nurse.  Taken for endovascular thrombectomy after obtaining consent.  Impression: Acute ischemic stroke-likely cardioembolic  Recommendations: Admit to ICU after thrombectomy Blood pressure parameters-if successful revascularization, systolic blood pressure goal would be between 120-140.  If revascularization fails, will allow for permissive hypertension. At this time, depending on the outcome of the endovascular  procedure, will recommend antiplatelets or anticoagulation. Repeat imaging again depending on the outcome of the endovascular procedure  MRI/ECHO/A1c/lipid panel Hyperglycemia management-maintain glucose between 140-180 PT/OT/speech therapy when able to  CNS -Close neuro monitoring -For dysphagia and dysarthria-needs bedside swallow evaluation and might need formal swallow evaluation.  She might need a PEG tube which the family says that she would not want. -PMR consult  Respiratory -Acute respiratory failure -Vent management per ICU -Extubate when able to -Possible aspiration pneumonia-chest x-ray.  Hold antibiotics for now  Cardiovascular -Essential hypertension and CAD -Obtain TTE -2D echo and possible -Blood pressure goals as above -Hyperlipidemia-statin for goal less than 70. -For atrial fibrillation, depending on the outcome of the revascularization, will resume anticoagulation-likely with heparin stroke protocol.  Hematological -No acute anemia -Coagulopathic due to anticoagulation for atrial fibrillation -Continue to monitor labs -Transfuse if hemoglobin is less than 7.  Endocrine Type 2 diabetes with hyperglycemia -Sliding scale -Goal A1c less than 7  GI/GU Baseline CKD stage III -Gentle hydration -Avoid nephrotoxic medications   Fluid electrolytes -Check labs and replete as necessary  Infectious disease -Possible aspiration pneumonia -CXR -N.p.o. -Hold off antibiotics for now  Prophylaxis DVT: scd   GI: ppi Bowel: doc/senna   Diet: NPO until cleared by speech  Code Status: FULL CODE  THE FOLLOWING WERE PRESENT ON ADMISSION: Acute ischemic stroke Basilar artery occlusion Possible aspiration pneumonia CKD 3 Essential HTN  Delays in the processes: There was significant amount of delay in reviewing of the CT scan images due to networking PACS related issues that were ongoing systemwide today 06/06/2020.  The neuroradiologist was present in the  CT scanner to review the images, but the only station available to review the images was the control station of the CT scanner which was suboptimal for image review.  This problem  did not get resolved for an extended amount of time, and led to significant delays in accurate assessment of the situation and decision making for offering treatment.  -- Amie Portland, MD [facility] Pager: [phone] If 7pm to 7am, please call on call as listed on [facility].   CRITICAL CARE ATTESTATION Performed by: Amie Portland, MD Total critical care time: 80 minutes Critical care time was exclusive of separately billable procedures and treating other patients and/or supervising APPs/Residents/Students Critical care was necessary to treat or prevent imminent or life-threatening deterioration due to basilar occlusion, Left MCA occlusion, acute ischemic stroke. This patient is critically ill and at significant risk for neurological worsening and/or death and care requires constant monitoring. Critical care was time spent personally by me on the following activities: development of treatment plan with patient and/or surrogate as well as nursing, discussions with consultants, evaluation of patient's response to treatment, examination of patient, obtaining history from patient or surrogate, ordering and performing treatments and interventions, ordering and review of laboratory studies, ordering and review of radiographic studies, pulse oximetry, re-evaluation of patient's condition, participation in multidisciplinary rounds and medical decision making of high complexity in the care of this patient.

## 2020-05-31 NOTE — Progress Notes (Signed)
Patient arrived to 4NICU approximately 1800.  Bilateral groin sites level 0.  Pulses doppler.  Patient not responsive, RN stopped sedation and titrate cleviprex for ordered blood pressure parameter.  On call neurology paged around [place] regarding patient still not unresponsive with sedation off.  STAT CT ordered.  Report given to night shift RN.  Night shift RN paged neurology that STAT CT head completed.

## 2020-05-31 NOTE — Progress Notes (Signed)
S: The patient is off all sedation with no responses to any external stimuli. Family would like to discuss goals of care.   O: BP (!) 146/62   Pulse 65   Temp (!) 97.4 F (36.3 C) (Oral)   Resp 15   Ht 5\' 4"  (1.626 m)   Wt 61.2 kg   SpO2 100%   BMI 23.16 kg/m   Exam:  Ment: No responses to any external stimuli. GCS 3T. CN: Pupils 6 mm and unreactive. No blink to threat. No oculocephalic reflex. No corneal reflex. Face flaccidly symmetric. Not overbreathing the vent. No cough or gag reflex.  Motor/Sensory: Flaccid tone x 4 with no movement of any limb to stimuli.   CT head images reviewed with family. Large left frontal lobe intraparenchymal hematoma and massive subarachnoid hemorrhage supratentorially and infratentorially are noted. There is early mass effect on the brainstem.   A/R: 79 year old female presenting with left MCA infarction. Thrombectomy complicated by left ICH and massive subarachnoid hemorrhage.  1. Discussed goals of care with the family in the context of the imaging and exam findings. I related to the family that there is essentially no chance of a meaningful neurological recovery. I also discussed with family that the hemorrhage is likely to be fatal, even with maximal medical and surgical management.  2. The family expressed their desire to make the patient DNR/DNI and to terminally extubate. The patient's husband and all family members present at the bedside expressed their agreement. All questions answered.  3. Will call CCM for terminal extubation procedure.  30 minutes of critical care time spent at the bedside, > 50% of time was spent in discussion of prognosis with the family.   Electronically signed: Dr. Kerney Elbe

## 2020-05-31 NOTE — Progress Notes (Signed)
Emptied in IR

## 2020-05-31 NOTE — Consult Note (Addendum)
NAME:  Audrey Mullen, MRN:  [id number], DOB:  July 04, 1941, LOS: 0 ADMISSION DATE:  05/19/2020, CONSULTATION DATE:  06/04/2020 REFERRING MD:  Rory Percy, CHIEF COMPLAINT:  AMS   Brief History   79 year old woman with hx of afib on AC, prior strokes presenting with after being found unresponsive.  History of present illness   79 year old woman with hx of afib on AC, prior strokes presenting with after being found unresponsive.  Code stroke CTA revealed multiple areas of infarct and thrombus.  Went for attempted clot retrieval of distal M1 complicated by extravasation requiring vessel sacrifices, distal basilar artery clot was successfully extracted.  Past Medical History  Prior Stroke GERD DM2 Walks with walker at baseline Afib on low dose eliquis Prior CABG  Significant Hospital Events   Admitted  Consults:    Procedures:  7/14 DIAGNOSTIC CEREBRAL ANGIOGRAM AND MECHANICAL THROMBECTOMY  Significant Diagnostic Tests:  IMPRESSION: CT head:  1. Large region of acute ischemic infarction within the left MCA and PCA vascular territories. Acute ischemic infarction changes are present within the left temporal lobe, left parietal lobe, left occipital lobe, portions of the left insula and left lentiform nucleus inferiorly. ASPECTS 5. Mild associated mass effect at this time. No evidence of hemorrhagic conversion. 2. Background moderate chronic small vessel ischemic changes within the cerebral white matter. 3. Mild generalized parenchymal atrophy.  CTA neck:  1. High-grade (greater than 70%) stenosis within the proximal left ICA. 2. The right common and internal carotids are patent within the neck without significant stenosis. 3. The vertebral arteries are patent within the neck bilaterally without significant stenosis.  CTA head:  1. Occlusion of the distal M1 left middle cerebral artery. There is some reconstitution of flow within M2 and more distal left MCA branches,  although asymmetrically decreased as compared to the right. 2. The proximal A1 left anterior cerebral artery is poorly delineated. This may be secondary to high-grade stenosis or partial occlusion. 3. Occlusion of the distal basilar artery. Thrombus appears to extend into the proximal P1 posterior cerebral arteries bilaterally. There is some reconstitution of flow within the distal P1 and more distal posterior cerebral arteries bilaterally. 4. Sites of up to moderate stenosis within the bilateral intracranial internal carotid arteries, as described.  CT perfusion head:  The perfusion software identifies a 51 mL core infarct within the left MCA and PCA vascular territories. The perfusion software identifies a 174 mL region of hypoperfused parenchyma within the left MCA and PCA vascular territory utilizing the Tmax>6 seconds threshold. Reported mismatch: 123 mL  Micro Data:  COVID neg  Antimicrobials:  None   Interim history/subjective:  Consulted  Objective   Blood pressure (!) 166/75, pulse 66, resp. rate 15, height 5\' 4"  (1.626 m), SpO2 100 %.    Vent Mode: PRVC FiO2 (%):  [21 %-100 %] 100 % Set Rate:  [15 bmp] 15 bmp Vt Set:  [430 mL] 430 mL PEEP:  [5 cmH20] 5 cmH20 Plateau Pressure:  [15 cmH20] 15 cmH20   Intake/Output Summary (Last 24 hours) at 06/03/2020 1824 Last data filed at 06/14/2020 1814 Gross per 24 hour  Intake 1550 ml  Output 2725 ml  Net -1175 ml   There were no vitals filed for this visit.  Examination: GEN: obtunded woman on vent HEENT: ETT in place, no secretions CV: RRR, sinus on rhythm PULM: Clear, no wheezing GI: Soft, hypoactive BS EXT: No edema, groin sites soft NEURO: She is GCS3 for me, no oculocephalic  or corneals, pupils sluggish, question if she is still under effects of anesthesia PSYCH: cannot assess SKIN: no rashes   Resolved Hospital Problem list   N/A  Assessment & Plan:  Multiple cerebral infarcts detailed above with  partial mass effect, partially successful interventions - SBP goal 100-120 - Further management (imaging, AC, neuro checks) per neurology and neuro-IR  Respiratory failure due to above - Usual lung protective tidal volumes - VAP prevention bundle - Check CXR and ABG  DM2- SSI for now  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  Hx HTN- CCB drip PRN for now  Best practice:  Diet: NPO, consider TF in AM Pain/Anxiety/Delirium protocol (if indicated): propofol and PRN fentanyl VAP protocol (if indicated): in place DVT prophylaxis: SCDs GI prophylaxis: PPI Glucose control: SSI Mobility: BR Code Status: Full Family Communication: per primary Disposition:   Labs   CBC: Recent Labs  Lab 05/19/2020 1201 06/16/2020 1213  WBC 10.5  --   NEUTROABS 9.2*  --   HGB 12.3 14.3  HCT 40.5 42.0  MCV 88.6  --   PLT 157  --     Basic Metabolic Panel: Recent Labs  Lab 05/27/2020 1201 05/20/2020 1213  NA 139 141  K 4.3 3.9  CL 104 106  CO2 22  --   GLUCOSE 188* 192*  BUN 20 24*  CREATININE 1.24* 1.20*  CALCIUM 9.5  --    GFR: CrCl cannot be calculated (Unknown ideal weight.). Recent Labs  Lab 06/09/2020 1201  WBC 10.5    Liver Function Tests: Recent Labs  Lab 06/06/2020 1201  AST 25  ALT 16  ALKPHOS 83  BILITOT 0.8  PROT 7.4  ALBUMIN 4.5   No results for input(s): LIPASE, AMYLASE in the last 168 hours. No results for input(s): AMMONIA in the last 168 hours.  ABG    Component Value Date/Time   PHART 7.378 11/29/2016 0707   PCO2ART 37.9 11/29/2016 0707   PO2ART 103.0 11/29/2016 0707   HCO3 22.2 11/29/2016 0707   TCO2 23 05/22/2020 1213   ACIDBASEDEF 3.0 (H) 11/29/2016 0707   O2SAT 98.0 11/29/2016 0707     Coagulation Profile: Recent Labs  Lab 06/12/2020 1201  INR 1.1    Cardiac Enzymes: No results for input(s): CKTOTAL, CKMB, CKMBINDEX, TROPONINI in the last 168 hours.  HbA1C: Hgb A1c MFr Bld  Date/Time Value Ref Range Status  07/04/2017 05:11 AM 8.8 (H) 4.8 - 5.6 % Final      Comment:    (NOTE) Pre diabetes:          5.7%-6.4% Diabetes:              >6.4% Glycemic control for   <7.0% adults with diabetes   11/28/2016 03:50 AM 7.6 (H) 4.8 - 5.6 % Final    Comment:    (NOTE)         Pre-diabetes: 5.7 - 6.4         Diabetes: >6.4         Glycemic control for adults with diabetes: <7.0     CBG: Recent Labs  Lab 06/04/2020 1150  GLUCAP 172*    Review of Systems:   Cannot assess, comatose  Past Medical History  She,  has a past medical history of Anginal pain ([place]), Arthritis, Coronary artery disease, Diabetes mellitus without complication ([place]), Dysrhythmia, GERD (gastroesophageal reflux disease), Glaucoma, Hyperlipidemia, Hypertension, Kidney stone, Stroke [facility]), Urinary tract infection, and Wears glasses.   Surgical History    Past Surgical History:  Procedure Laterality Date  . CARDIAC CATHETERIZATION N/A 11/25/2016   Procedure: Left Heart Cath and Coronary Angiography;  Surgeon: Lorretta Harp, MD;  Location: [facility];  Service: Cardiovascular;  Laterality: N/A;  . CORONARY ARTERY BYPASS GRAFT N/A 11/28/2016   Procedure: CORONARY ARTERY BYPASS GRAFTING (CABG) x 4;  Surgeon: Melrose Nakayama, MD;  Location: [place];  Service: Open Heart Surgery;  Laterality: N/A;  . CYSTOSCOPY WITH RETROGRADE PYELOGRAM, URETEROSCOPY AND STENT PLACEMENT Left 06/03/2016   Procedure: CYSTOSCOPY WITH LEFT RETROGRADE PYELOGRAM, URETEROSCOPY, REMOVAL OF LEFT NEPHROSTOMY TUBE, INSERTION OF DOUBLE J STENT, LEFT;  Surgeon: Carolan Clines, MD;  Location: [facility];  Service: Urology;  Laterality: Left;  . ENDOVEIN HARVEST OF GREATER SAPHENOUS VEIN Right 11/28/2016   Procedure: ENDOVEIN HARVEST OF GREATER SAPHENOUS VEIN;  Surgeon: Melrose Nakayama, MD;  Location: [place];  Service: Open Heart Surgery;  Laterality: Right;  . EYE SURGERY     cataract removed per left eye   . HOLMIUM LASER APPLICATION Left 8/45/3646   Procedure: HOLMIUM LASER OF STONE ;  Surgeon:  Carolan Clines, MD;  Location: [facility];  Service: Urology;  Laterality: Left;  Marland Kitchen MAZE N/A 11/28/2016   Procedure: MAZE;  Surgeon: Melrose Nakayama, MD;  Location: [place];  Service: Open Heart Surgery;  Laterality: N/A;  . ROTATOR CUFF REPAIR    . TEE WITHOUT CARDIOVERSION N/A 11/28/2016   Procedure: TRANSESOPHAGEAL ECHOCARDIOGRAM (TEE);  Surgeon: Melrose Nakayama, MD;  Location: [place];  Service: Open Heart Surgery;  Laterality: N/A;     Social History   reports that she has never smoked. She has never used smokeless tobacco. She reports that she does not drink alcohol and does not use drugs.   Family History   Her family history includes Heart disease in her father. There is no history of CAD.   Allergies No Known Allergies   Home Medications  Prior to Admission medications   Medication Sig Start Date End Date Taking? Authorizing Provider  apixaban (ELIQUIS) 2.5 MG TABS tablet Take 1 tablet (2.5 mg total) by mouth 2 (two) times daily. 02/04/20  Yes Josue Hector, MD  carvedilol (COREG) 12.5 MG tablet TAKE 1 TABLET BY MOUTH TWICE DAILY WITH A MEAL Patient taking differently: Take 12.5 mg by mouth 2 (two) times daily with a meal.  03/01/20  Yes Josue Hector, MD  diltiazem (CARDIZEM CD) 240 MG 24 hr capsule TAKE 1 CAPSULE(240 MG) BY MOUTH DAILY Patient taking differently: Take 240 mg by mouth daily.  06/25/19  Yes Josue Hector, MD  rosuvastatin (CRESTOR) 5 MG tablet Take 5 mg by mouth daily.    Yes [provider]  sitaGLIPtin (JANUVIA) 25 MG tablet Take 25 mg by mouth daily.    Yes [provider]  aspirin EC 81 MG tablet Take 1 tablet (81 mg total) by mouth daily. 11/21/16   Bhagat, Crista Luria, PA  gabapentin (NEURONTIN) 100 MG capsule Take 1 capsule (100 mg total) by mouth at bedtime. Patient not taking: Reported on 05/28/2020 05/12/17   Dennie Bible, NP  latanoprost (XALATAN) 0.005 % ophthalmic solution Place 1 drop into both eyes at  bedtime. Patient not taking: Reported on 06/03/2020    [provider]  pantoprazole (PROTONIX) 40 MG tablet TAKE 1 TABLET(40 MG) BY MOUTH DAILY Patient not taking: Reported on 06/15/2020 05/01/17   Josue Hector, MD  senna-docusate (SENOKOT-S) 8.6-50 MG tablet Take 1 tablet by mouth at bedtime as  needed for mild constipation. Patient not taking: Reported on 05/28/2020 07/05/17   Raiford Noble Latif, DO  SODIUM BICARBONATE PO Take 1 tablet by mouth 2 (two) times daily. Patient not taking: Reported on 05/30/2020    [provider]     Critical care time: 35 minutes

## 2020-05-31 NOTE — Procedures (Signed)
INTERVENTIONAL NEURORADIOLOGY BRIEF POSTPROCEDURE NOTE  DIAGNOSTIC CEREBRAL ANGIOGRAM AND MECHANICAL THROMBECTOMY  Attending: Dr. Pedro Earls  Assistant: None  Diagnosis: Left M1/MCA occlusion/Basilar apex occlusion  Access site: Bilateral femoral   Access closure: 60F angioseal on each side  Anesthesia: General  Medication used: refer to anesthesia note for sedation meds.  Complications: Contrast extravasation likely from stretched perforator.   Estimated blood loss: 200 mL  Specimen: None  Findings: Distal left M1 occlusion. One pass performed with stent retriever + aspiration with partial recanalization (TICI2B). Contrast extravzation noted. Vessel sacrifice performed with coild and glue.  Distal basilar occlusion. One pass performed with stent retriever with complete recanalization (TICI3).   The patient tolerated the procedure well without incident or complication and is in stable condition.   Plan:  - Maintained intubated for airway protection - ICU level of care - SBP 100-120

## 2020-05-31 NOTE — Anesthesia Procedure Notes (Signed)
Procedure Name: Intubation Date/Time: 06/07/2020 1:43 PM Performed by: Griffin Dakin, CRNA Pre-anesthesia Checklist: Patient identified, Emergency Drugs available, Suction available and Patient being monitored Patient Re-evaluated:Patient Re-evaluated prior to induction Oxygen Delivery Method: Circle system utilized Preoxygenation: Pre-oxygenation with 100% oxygen Induction Type: IV induction Ventilation: Mask ventilation without difficulty Laryngoscope Size: Glidescope and 3 Grade View: Grade I Tube type: Oral Tube size: 7.5 mm Number of attempts: 1 Airway Equipment and Method: Stylet and Oral airway Placement Confirmation: ETT inserted through vocal cords under direct vision,  positive ETCO2 and breath sounds checked- equal and bilateral Secured at: 22 cm Tube secured with: Tape Dental Injury: Teeth and Oropharynx as per pre-operative assessment

## 2020-05-31 NOTE — Progress Notes (Signed)
eLink Physician-Brief Progress Note Patient Name: Audrey Mullen DOB: 1941/02/11 MRN: [id number]   Date of Service  05/20/2020  HPI/Events of Note  Hypotension - BP = 72/51 by A-line.  eICU Interventions  Plan: 1. Phenylephrine IV infusion via PIV. Titrate to MAP >= 65.      Intervention Category Major Interventions: Hypotension - evaluation and management  Lysle Dingwall 06/06/2020, 8:28 PM

## 2020-05-31 NOTE — Transfer of Care (Signed)
Immediate Anesthesia Transfer of Care Note  Patient: Audrey Mullen  Procedure(s) Performed: IR WITH ANESTHESIA (N/A )  Patient Location: ICU  Anesthesia Type:General  Level of Consciousness: sedated and Patient remains intubated per anesthesia plan  Airway & Oxygen Therapy: Patient remains intubated per anesthesia plan and Patient placed on Ventilator (see vital sign flow sheet for setting)  Post-op Assessment: Report given to RN and Post -op Vital signs reviewed and stable  Post vital signs: Reviewed and stable  Last Vitals:  Vitals Value Taken Time  BP 145/65 05/23/2020 1814  Temp    Pulse 65 06/03/2020 1816  Resp 15 06/10/2020 1816  SpO2 100 % 05/18/2020 1816  Vitals shown include unvalidated device data.  Last Pain: There were no vitals filed for this visit.       Complications: No complications documented.

## 2020-05-31 NOTE — ED Triage Notes (Signed)
Pt here from home after being found unresponsive by son at 47, LKN was 0400. Per son, pt talks and interacts with people at baseline. Pt was incontinent of urine and stool. EMS noted facial droop, and drift on R arm. Hx stroke last year, is on a blood thinner, unknown which one. Pt GCS 12, not speaking, will open eyes to speech. vss.

## 2020-05-31 NOTE — Sedation Documentation (Signed)
Transported pt to 4N19 with IR tech and anesthesia. Pt vitals remained stable during transport. Report given at bedside to TK, 4N RN. Left and right groin level 0 and unremarkable at this time. Unable to assess NIH due to pt being sedated and intubated.

## 2020-05-31 NOTE — ED Provider Notes (Signed)
[facility] EMERGENCY DEPARTMENT Provider Note   CSN: [id number] Arrival date & time: 06/04/2020  1146  An emergency department physician performed an initial assessment on this suspected stroke patient at 1200.  History Chief Complaint  Patient presents with   Altered Mental Status    Audrey Mullen is a 79 y.o. female presenting from home with AMS and weakness.  Her son last saw her well at 0400 prior to him leaving for work.  He found her altered on his return.  Normally she is talking, but now she is nonverbal.  She is not moving her right arm.  She has a reported hx of stroke 1 year ago and is prescribed eliquis - the son reported he "thinks" she is takign her blood thinners but unsure.  Patient is nonverbal on arrival, but otherwise awake, not following commands.  HPI     Past Medical History:  Diagnosis Date   Anginal pain ([place])    Arthritis    Coronary artery disease    Diabetes mellitus without complication ([place])    Dysrhythmia    atrail fibrillation   GERD (gastroesophageal reflux disease)    Glaucoma    bilat    Hyperlipidemia    Hypertension    Kidney stone    Stroke [facility])    Urinary tract infection    Wears glasses     Patient Active Problem List   Diagnosis Date Noted   Acute ischemic stroke ([place]) 06/14/2020   Chronic anticoagulation 09/01/2017   Diabetes mellitus ([place]) 09/01/2017   Stroke (cerebrum) ([place]) 07/03/2017   GERD (gastroesophageal reflux disease) 07/03/2017   CAD (coronary artery disease) 07/03/2017   UTI (urinary tract infection) 07/03/2017   CKD (chronic kidney disease), stage III 07/03/2017   Essential hypertension 02/10/2017   Hyperlipidemia 02/10/2017   Cerebrovascular accident (CVA) due to embolism of cerebral artery ([place]) 11/30/2016   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   Coronary artery disease due to lipid rich plaque 11/25/2016   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[facility])    Abnormal nuclear stress  test    Bacteremia    Hydronephrosis    Ureteral stone with hydronephrosis    Sepsis ([place]) 04/02/2016   AKI (acute kidney injury) ([place]) 04/02/2016   Pyelonephritis, acute 04/02/2016   Atrial fibrillation ([place]) 04/02/2016    Past Surgical History:  Procedure Laterality Date   CARDIAC CATHETERIZATION N/A 11/25/2016   Procedure: Left Heart Cath and Coronary Angiography;  Surgeon: Lorretta Harp, MD;  Location: [place] [facility];  Service: Cardiovascular;  Laterality: N/A;   CORONARY ARTERY BYPASS GRAFT N/A 11/28/2016   Procedure: CORONARY ARTERY BYPASS GRAFTING (CABG) x 4;  Surgeon: Melrose Nakayama, MD;  Location: [place];  Service: Open Heart Surgery;  Laterality: N/A;   CYSTOSCOPY WITH RETROGRADE PYELOGRAM, URETEROSCOPY AND STENT PLACEMENT Left 06/03/2016   Procedure: CYSTOSCOPY WITH LEFT RETROGRADE PYELOGRAM, URETEROSCOPY, REMOVAL OF LEFT NEPHROSTOMY TUBE, INSERTION OF DOUBLE J STENT, LEFT;  Surgeon: Carolan Clines, MD;  Location: [facility];  Service: Urology;  Laterality: Left;   ENDOVEIN HARVEST OF GREATER SAPHENOUS VEIN Right 11/28/2016   Procedure: ENDOVEIN HARVEST OF GREATER SAPHENOUS VEIN;  Surgeon: Melrose Nakayama, MD;  Location: [facility];  Service: Open Heart Surgery;  Laterality: Right;   EYE SURGERY     cataract removed per left eye    HOLMIUM LASER APPLICATION Left 6/78/9381   Procedure: HOLMIUM LASER OF STONE ;  Surgeon: Carolan Clines, MD;  Location: [facility];  Service: Urology;  Laterality: Left;   MAZE N/A 11/28/2016   Procedure: MAZE;  Surgeon: Melrose Nakayama, MD;  Location: [facility];  Service: Open Heart Surgery;  Laterality: N/A;   ROTATOR CUFF REPAIR     TEE WITHOUT CARDIOVERSION N/A 11/28/2016   Procedure: TRANSESOPHAGEAL ECHOCARDIOGRAM (TEE);  Surgeon: Melrose Nakayama, MD;  Location: [facility];  Service: Open Heart Surgery;  Laterality: N/A;     OB History   No obstetric history on file.     Family History  Problem Relation Age of  Onset   Heart disease Father    CAD Neg Hx     Social History   Tobacco Use   Smoking status: Never Smoker   Smokeless tobacco: Never Used  Substance Use Topics   Alcohol use: No   Drug use: No    Home Medications Prior to Admission medications   Medication Sig Start Date End Date Taking? Authorizing Provider  apixaban (ELIQUIS) 2.5 MG TABS tablet Take 1 tablet (2.5 mg total) by mouth 2 (two) times daily. 02/04/20  Yes Josue Hector, MD  carvedilol (COREG) 12.5 MG tablet TAKE 1 TABLET BY MOUTH TWICE DAILY WITH A MEAL Patient taking differently: Take 12.5 mg by mouth 2 (two) times daily with a meal.  03/01/20  Yes Josue Hector, MD  diltiazem (CARDIZEM CD) 240 MG 24 hr capsule TAKE 1 CAPSULE(240 MG) BY MOUTH DAILY Patient taking differently: Take 240 mg by mouth daily.  06/25/19  Yes Josue Hector, MD  rosuvastatin (CRESTOR) 5 MG tablet Take 5 mg by mouth daily.    Yes [provider]  sitaGLIPtin (JANUVIA) 25 MG tablet Take 25 mg by mouth daily.    Yes [provider]  aspirin EC 81 MG tablet Take 1 tablet (81 mg total) by mouth daily. 11/21/16   Bhagat, Crista Luria, PA  gabapentin (NEURONTIN) 100 MG capsule Take 1 capsule (100 mg total) by mouth at bedtime. Patient not taking: Reported on 05/18/2020 05/12/17   Dennie Bible, NP  latanoprost (XALATAN) 0.005 % ophthalmic solution Place 1 drop into both eyes at bedtime. Patient not taking: Reported on 05/29/2020    [provider]  pantoprazole (PROTONIX) 40 MG tablet TAKE 1 TABLET(40 MG) BY MOUTH DAILY Patient not taking: Reported on 05/26/2020 05/01/17   Josue Hector, MD  senna-docusate (SENOKOT-S) 8.6-50 MG tablet Take 1 tablet by mouth at bedtime as needed for mild constipation. Patient not taking: Reported on 05/30/2020 07/05/17   Raiford Noble Latif, DO  SODIUM BICARBONATE PO Take 1 tablet by mouth 2 (two) times daily. Patient not taking: Reported on 06/02/2020    [provider]     Allergies    Patient has no known allergies.  Review of Systems   Review of Systems  Unable to perform ROS: Mental status change (level 5 caveat)    Physical Exam Updated Vital Signs BP (!) 166/75 (BP Location: Left Arm)    Pulse 71    Resp 17    SpO2 99%   Physical Exam Vitals and nursing note reviewed.  Constitutional:      General: She is not in acute distress.    Appearance: She is well-developed.     Comments: Nonverbal, eyes open on arrival  HENT:     Head: Normocephalic and atraumatic.  Eyes:     Conjunctiva/sclera: Conjunctivae normal.     Pupils: Pupils are equal, round, and reactive to light.     Comments: No clear gaze deviation, eyes  do not track fingers on ROM testing  Cardiovascular:     Rate and Rhythm: Normal rate and regular rhythm.     Pulses: Normal pulses.  Pulmonary:     Effort: Pulmonary effort is normal. No respiratory distress.  Abdominal:     Palpations: Abdomen is soft.     Tenderness: There is no abdominal tenderness.  Musculoskeletal:     Cervical back: Neck supple.  Skin:    General: Skin is warm and dry.  Neurological:     Mental Status: She is alert.     Comments: Right sided facial droop Right arm weakness 1/5 strength Left arm 4/5 strength Moving legs spontaneously Nonverbal, does not appear to comprehend speech     ED Results / Procedures / Treatments   Labs (all labs ordered are listed, but only abnormal results are displayed) Labs Reviewed  APTT - Abnormal; Notable for the following components:      Result Value   aPTT 23 (*)    All other components within normal limits  DIFFERENTIAL - Abnormal; Notable for the following components:   Neutro Abs 9.2 (*)    All other components within normal limits  COMPREHENSIVE METABOLIC PANEL - Abnormal; Notable for the following components:   Glucose, Bld 188 (*)    Creatinine, Ser 1.24 (*)    GFR calc non Af Amer 41 (*)    GFR calc Af Amer 47 (*)    All other components within  normal limits  CBG MONITORING, ED - Abnormal; Notable for the following components:   Glucose-Capillary 172 (*)    All other components within normal limits  I-STAT CHEM 8, ED - Abnormal; Notable for the following components:   BUN 24 (*)    Creatinine, Ser 1.20 (*)    Glucose, Bld 192 (*)    Calcium, Ion 0.98 (*)    All other components within normal limits  SARS CORONAVIRUS 2 BY RT PCR (HOSPITAL ORDER, [facility])  ETHANOL  PROTIME-INR  CBC  RAPID URINE DRUG SCREEN, HOSP PERFORMED  URINALYSIS, ROUTINE W REFLEX MICROSCOPIC  HEMOGLOBIN A1C  LIPID PANEL    EKG EKG Interpretation  Date/Time:  Wednesday May 31 2020 11:48:37 EDT Ventricular Rate:  68 PR Interval:    QRS Duration: 81 QT Interval:  552 QTC Calculation: 588 R Axis:   38 Text Interpretation: Sinus rhythm Nonspecific T abnormalities, anterior leads Prolonged QT interval No STEMI Confirmed by Octaviano Glow [id number]) on 06/13/2020 12:30:53 PM   Radiology CT Code Stroke CTA Head W/WO contrast  Result Date: 05/24/2020 CLINICAL DATA:  Expressive aphasia, right-sided arm weakness; neuro deficit, acute, stroke suspected. EXAM: CT ANGIOGRAPHY HEAD AND NECK CT PERFUSION BRAIN TECHNIQUE: Multidetector CT imaging of the head and neck was performed using the standard protocol during bolus administration of intravenous contrast. Multiplanar CT image reconstructions and MIPs were obtained to evaluate the vascular anatomy. Carotid stenosis measurements (when applicable) are obtained utilizing NASCET criteria, using the distal internal carotid diameter as the denominator. Multiphase CT imaging of the brain was performed following IV bolus contrast injection. Subsequent parametric perfusion maps were calculated using RAPID software. CONTRAST:  62mL OMNIPAQUE IOHEXOL 350 MG/ML SOLN COMPARISON:  Head CT 03/27/2018, brain MRI 07/03/2017. FINDINGS: CT HEAD FINDINGS Brain: No evidence of acute intracranial hemorrhage.  There is a large region of abnormal cortical/subcortical hypodensity within the left temporal, parietal and occipital lobes consistent with acute ischemic infarction. There is also involvement of the left insula and portions  of the left lentiform nucleus inferiorly. There is mild associated mass effect at this time. Background moderate patchy hypoattenuation within the cerebral white matter is nonspecific, but consistent with chronic small vessel ischemic disease. Mild generalized parenchymal atrophy. No extra-axial fluid collection. No evidence of intracranial mass. No midline shift. Vascular: Reported below. Skull: Normal. Negative for fracture or focal lesion. Sinuses/Orbits: Visualized orbits show no acute finding. Mild ethmoid sinus mucosal thickening. ASPECTS ([place] Stroke Program Early CT Score) - Ganglionic level infarction (caudate, lentiform nuclei, internal capsule, insula, M1-M3 cortex): 2 - Supraganglionic infarction (M4-M6 cortex): 3 Total score (0-10 with 10 being normal): 5 Review of the MIP images confirms the above findings CTA NECK FINDINGS Aortic arch: Standard aortic branching. Atherosclerotic plaque within the visualized aortic arch and proximal major branch vessels of the neck. No hemodynamically significant innominate or proximal subclavian artery stenosis. Right carotid system: CCA and ICA patent within the neck without significant stenosis (50% or greater). Mild mixed plaque within the distal CCA, carotid bifurcation and proximal ICA. Left carotid system: CCA and ICA patent within the neck. Prominent calcified plaque within the carotid bifurcation and proximal ICA. Apparent high-grade stenosis of the proximal ICA (greater than 70%). Vertebral arteries: The vertebral arteries are patent within the neck without significant stenosis. The left vertebral artery is subtly dominant. Skeleton: No acute bony abnormality or aggressive osseous lesion. Advanced cervical spondylosis with multilevel  disc space narrowing, posterior disc osteophytes, uncovertebral and facet hypertrophy. Other neck: No neck mass or cervical lymphadenopathy. Thyroid unremarkable. Upper chest: No consolidation within the imaged lung apices. Review of the MIP images confirms the above findings CTA HEAD FINDINGS Anterior circulation: The intracranial internal carotid arteries are patent. There is diminished opacification of the intracranial left ICA and M1 left MCA, which may be due to downstream occlusion. Calcified plaque within both intracranial ICAs. Sites of up to moderate stenosis within the paraclinoid right ICA and cavernous/paraclinoid left ICA. There is occlusion of the distal M1 left middle cerebral artery. There is some reconstitution of flow within M2 and more distal left MCA branch vessels, although asymmetrically decreased as compared to the right. The M1 right middle cerebral artery is patent without significant stenosis. No right M2 proximal branch occlusion or high-grade proximal stenosis is identified. The proximal A1 left anterior cerebral artery is poorly delineated which may be secondary to high-grade stenosis or partial occlusion. The A1 right anterior cerebral artery and more distal anterior cerebral arteries are patent. Posterior circulation: The intracranial vertebral arteries are patent without significant stenosis. The proximal and mid basilar artery is patent without significant stenosis. Thrombus appears to extend in the proximal P1 posterior cerebral arteries bilaterally. There is some reconstitution of flow within the distal P1 and more distal posterior cerebral arteries bilaterally. A small right posterior communicating artery is present. The left posterior communicating artery is hypoplastic or absent Venous sinuses: Within limitations of contrast timing, no convincing thrombus. Anatomic variants: As described Review of the MIP images confirms the above findings CT Brain Perfusion Findings: ASPECTS: 5  CBF (<30%) Volume: 2mL Perfusion (Tmax>6.0s) volume: 12mL (within the left MCA and PCA vascular territories). Mismatch Volume: 123 mLmL Infarction Location:The left MCA and PCA vascular territories. These findings were discussed at the [facility] with Dr. Rory Percy of neurology at the time of image acquisition. The noncontrast head CT was discussed at 12:15 p.m. The CTA head/neck and CT perfusion was discussed at approximately 12:30 p.m. IMPRESSION: CT head: 1. Large region of acute ischemic infarction within  the left MCA and PCA vascular territories. Acute ischemic infarction changes are present within the left temporal lobe, left parietal lobe, left occipital lobe, portions of the left insula and left lentiform nucleus inferiorly. ASPECTS 5. Mild associated mass effect at this time. No evidence of hemorrhagic conversion. 2. Background moderate chronic small vessel ischemic changes within the cerebral white matter. 3. Mild generalized parenchymal atrophy. CTA neck: 1. High-grade (greater than 70%) stenosis within the proximal left ICA. 2. The right common and internal carotids are patent within the neck without significant stenosis. 3. The vertebral arteries are patent within the neck bilaterally without significant stenosis. CTA head: 1. Occlusion of the distal M1 left middle cerebral artery. There is some reconstitution of flow within M2 and more distal left MCA branches, although asymmetrically decreased as compared to the right. 2. The proximal A1 left anterior cerebral artery is poorly delineated. This may be secondary to high-grade stenosis or partial occlusion. 3. Occlusion of the distal basilar artery. Thrombus appears to extend into the proximal P1 posterior cerebral arteries bilaterally. There is some reconstitution of flow within the distal P1 and more distal posterior cerebral arteries bilaterally. 4. Sites of up to moderate stenosis within the bilateral intracranial internal carotid arteries, as  described. CT perfusion head: The perfusion software identifies a 51 mL core infarct within the left MCA and PCA vascular territories. The perfusion software identifies a 174 mL region of hypoperfused parenchyma within the left MCA and PCA vascular territory utilizing the Tmax>6 seconds threshold. Reported mismatch: 123 mL Electronically Signed   By: Kellie Simmering DO   On: 06/05/2020 13:25   CT Code Stroke CTA Neck W/WO contrast  Result Date: 05/18/2020 CLINICAL DATA:  Expressive aphasia, right-sided arm weakness; neuro deficit, acute, stroke suspected. EXAM: CT ANGIOGRAPHY HEAD AND NECK CT PERFUSION BRAIN TECHNIQUE: Multidetector CT imaging of the head and neck was performed using the standard protocol during bolus administration of intravenous contrast. Multiplanar CT image reconstructions and MIPs were obtained to evaluate the vascular anatomy. Carotid stenosis measurements (when applicable) are obtained utilizing NASCET criteria, using the distal internal carotid diameter as the denominator. Multiphase CT imaging of the brain was performed following IV bolus contrast injection. Subsequent parametric perfusion maps were calculated using RAPID software. CONTRAST:  32mL OMNIPAQUE IOHEXOL 350 MG/ML SOLN COMPARISON:  Head CT 03/27/2018, brain MRI 07/03/2017. FINDINGS: CT HEAD FINDINGS Brain: No evidence of acute intracranial hemorrhage. There is a large region of abnormal cortical/subcortical hypodensity within the left temporal, parietal and occipital lobes consistent with acute ischemic infarction. There is also involvement of the left insula and portions of the left lentiform nucleus inferiorly. There is mild associated mass effect at this time. Background moderate patchy hypoattenuation within the cerebral white matter is nonspecific, but consistent with chronic small vessel ischemic disease. Mild generalized parenchymal atrophy. No extra-axial fluid collection. No evidence of intracranial mass. No midline  shift. Vascular: Reported below. Skull: Normal. Negative for fracture or focal lesion. Sinuses/Orbits: Visualized orbits show no acute finding. Mild ethmoid sinus mucosal thickening. ASPECTS ([place] Stroke Program Early CT Score) - Ganglionic level infarction (caudate, lentiform nuclei, internal capsule, insula, M1-M3 cortex): 2 - Supraganglionic infarction (M4-M6 cortex): 3 Total score (0-10 with 10 being normal): 5 Review of the MIP images confirms the above findings CTA NECK FINDINGS Aortic arch: Standard aortic branching. Atherosclerotic plaque within the visualized aortic arch and proximal major branch vessels of the neck. No hemodynamically significant innominate or proximal subclavian artery stenosis. Right carotid system: CCA and  ICA patent within the neck without significant stenosis (50% or greater). Mild mixed plaque within the distal CCA, carotid bifurcation and proximal ICA. Left carotid system: CCA and ICA patent within the neck. Prominent calcified plaque within the carotid bifurcation and proximal ICA. Apparent high-grade stenosis of the proximal ICA (greater than 70%). Vertebral arteries: The vertebral arteries are patent within the neck without significant stenosis. The left vertebral artery is subtly dominant. Skeleton: No acute bony abnormality or aggressive osseous lesion. Advanced cervical spondylosis with multilevel disc space narrowing, posterior disc osteophytes, uncovertebral and facet hypertrophy. Other neck: No neck mass or cervical lymphadenopathy. Thyroid unremarkable. Upper chest: No consolidation within the imaged lung apices. Review of the MIP images confirms the above findings CTA HEAD FINDINGS Anterior circulation: The intracranial internal carotid arteries are patent. There is diminished opacification of the intracranial left ICA and M1 left MCA, which may be due to downstream occlusion. Calcified plaque within both intracranial ICAs. Sites of up to moderate stenosis within the  paraclinoid right ICA and cavernous/paraclinoid left ICA. There is occlusion of the distal M1 left middle cerebral artery. There is some reconstitution of flow within M2 and more distal left MCA branch vessels, although asymmetrically decreased as compared to the right. The M1 right middle cerebral artery is patent without significant stenosis. No right M2 proximal branch occlusion or high-grade proximal stenosis is identified. The proximal A1 left anterior cerebral artery is poorly delineated which may be secondary to high-grade stenosis or partial occlusion. The A1 right anterior cerebral artery and more distal anterior cerebral arteries are patent. Posterior circulation: The intracranial vertebral arteries are patent without significant stenosis. The proximal and mid basilar artery is patent without significant stenosis. Thrombus appears to extend in the proximal P1 posterior cerebral arteries bilaterally. There is some reconstitution of flow within the distal P1 and more distal posterior cerebral arteries bilaterally. A small right posterior communicating artery is present. The left posterior communicating artery is hypoplastic or absent Venous sinuses: Within limitations of contrast timing, no convincing thrombus. Anatomic variants: As described Review of the MIP images confirms the above findings CT Brain Perfusion Findings: ASPECTS: 5 CBF (<30%) Volume: 65mL Perfusion (Tmax>6.0s) volume: 160mL (within the left MCA and PCA vascular territories). Mismatch Volume: 123 mLmL Infarction Location:The left MCA and PCA vascular territories. These findings were discussed at the [facility] with Dr. Rory Percy of neurology at the time of image acquisition. The noncontrast head CT was discussed at 12:15 p.m. The CTA head/neck and CT perfusion was discussed at approximately 12:30 p.m. IMPRESSION: CT head: 1. Large region of acute ischemic infarction within the left MCA and PCA vascular territories. Acute ischemic infarction  changes are present within the left temporal lobe, left parietal lobe, left occipital lobe, portions of the left insula and left lentiform nucleus inferiorly. ASPECTS 5. Mild associated mass effect at this time. No evidence of hemorrhagic conversion. 2. Background moderate chronic small vessel ischemic changes within the cerebral white matter. 3. Mild generalized parenchymal atrophy. CTA neck: 1. High-grade (greater than 70%) stenosis within the proximal left ICA. 2. The right common and internal carotids are patent within the neck without significant stenosis. 3. The vertebral arteries are patent within the neck bilaterally without significant stenosis. CTA head: 1. Occlusion of the distal M1 left middle cerebral artery. There is some reconstitution of flow within M2 and more distal left MCA branches, although asymmetrically decreased as compared to the right. 2. The proximal A1 left anterior cerebral artery is poorly delineated. This  may be secondary to high-grade stenosis or partial occlusion. 3. Occlusion of the distal basilar artery. Thrombus appears to extend into the proximal P1 posterior cerebral arteries bilaterally. There is some reconstitution of flow within the distal P1 and more distal posterior cerebral arteries bilaterally. 4. Sites of up to moderate stenosis within the bilateral intracranial internal carotid arteries, as described. CT perfusion head: The perfusion software identifies a 51 mL core infarct within the left MCA and PCA vascular territories. The perfusion software identifies a 174 mL region of hypoperfused parenchyma within the left MCA and PCA vascular territory utilizing the Tmax>6 seconds threshold. Reported mismatch: 123 mL Electronically Signed   By: Kellie Simmering DO   On: 06/16/2020 13:25   MR BRAIN WO CONTRAST  Result Date: 05/22/2020 CLINICAL DATA:  Stroke. EXAM: MRI HEAD WITHOUT CONTRAST TECHNIQUE: Multiplanar, multiecho pulse sequences of the brain and surrounding structures  were obtained without intravenous contrast. COMPARISON:  CTA head and CT perfusion earlier today FINDINGS: Brain: Large territory acute infarct left MCA territory involving the lateral temporal lobe extending to the lateral parietal lobe. Additional area of acute infarct in the left hippocampus. Additional small areas of acute infarct in the cerebellum on the left. No associated hemorrhage or midline shift. Ventricle size normal. Moderate chronic microvascular ischemic change in the white matter. Small chronic infarct right cerebellum. Negative for mass lesion. Vascular: Abnormal signal distal basilar artery compatible with thrombus as noted on CTA. Also abnormal signal in the left M2 branch compatible thrombus. Major vessels at the base of the brain show normal flow voids. Skull and upper cervical spine: No focal skeletal lesion. Sinuses/Orbits: Paranasal sinuses clear. Bilateral cataract extraction Other: None IMPRESSION: Acute infarct left MCA territory inferior division. Additional acute infarct in the left hippocampus in the left PCA territory. Small areas of acute infarct in the left cerebellum. No acute hemorrhage identified. Findings consistent with emboli. Electronically Signed   By: Franchot Gallo M.D.   On: 06/09/2020 13:30   CT Code Stroke Cerebral Perfusion with contrast  Result Date: 06/10/2020 CLINICAL DATA:  Expressive aphasia, right-sided arm weakness; neuro deficit, acute, stroke suspected. EXAM: CT ANGIOGRAPHY HEAD AND NECK CT PERFUSION BRAIN TECHNIQUE: Multidetector CT imaging of the head and neck was performed using the standard protocol during bolus administration of intravenous contrast. Multiplanar CT image reconstructions and MIPs were obtained to evaluate the vascular anatomy. Carotid stenosis measurements (when applicable) are obtained utilizing NASCET criteria, using the distal internal carotid diameter as the denominator. Multiphase CT imaging of the brain was performed following IV  bolus contrast injection. Subsequent parametric perfusion maps were calculated using RAPID software. CONTRAST:  50mL OMNIPAQUE IOHEXOL 350 MG/ML SOLN COMPARISON:  Head CT 03/27/2018, brain MRI 07/03/2017. FINDINGS: CT HEAD FINDINGS Brain: No evidence of acute intracranial hemorrhage. There is a large region of abnormal cortical/subcortical hypodensity within the left temporal, parietal and occipital lobes consistent with acute ischemic infarction. There is also involvement of the left insula and portions of the left lentiform nucleus inferiorly. There is mild associated mass effect at this time. Background moderate patchy hypoattenuation within the cerebral white matter is nonspecific, but consistent with chronic small vessel ischemic disease. Mild generalized parenchymal atrophy. No extra-axial fluid collection. No evidence of intracranial mass. No midline shift. Vascular: Reported below. Skull: Normal. Negative for fracture or focal lesion. Sinuses/Orbits: Visualized orbits show no acute finding. Mild ethmoid sinus mucosal thickening. ASPECTS [facility] Stroke Program Early CT Score) - Ganglionic level infarction (caudate, lentiform nuclei, internal capsule, insula,  M1-M3 cortex): 2 - Supraganglionic infarction (M4-M6 cortex): 3 Total score (0-10 with 10 being normal): 5 Review of the MIP images confirms the above findings CTA NECK FINDINGS Aortic arch: Standard aortic branching. Atherosclerotic plaque within the visualized aortic arch and proximal major branch vessels of the neck. No hemodynamically significant innominate or proximal subclavian artery stenosis. Right carotid system: CCA and ICA patent within the neck without significant stenosis (50% or greater). Mild mixed plaque within the distal CCA, carotid bifurcation and proximal ICA. Left carotid system: CCA and ICA patent within the neck. Prominent calcified plaque within the carotid bifurcation and proximal ICA. Apparent high-grade stenosis of the proximal  ICA (greater than 70%). Vertebral arteries: The vertebral arteries are patent within the neck without significant stenosis. The left vertebral artery is subtly dominant. Skeleton: No acute bony abnormality or aggressive osseous lesion. Advanced cervical spondylosis with multilevel disc space narrowing, posterior disc osteophytes, uncovertebral and facet hypertrophy. Other neck: No neck mass or cervical lymphadenopathy. Thyroid unremarkable. Upper chest: No consolidation within the imaged lung apices. Review of the MIP images confirms the above findings CTA HEAD FINDINGS Anterior circulation: The intracranial internal carotid arteries are patent. There is diminished opacification of the intracranial left ICA and M1 left MCA, which may be due to downstream occlusion. Calcified plaque within both intracranial ICAs. Sites of up to moderate stenosis within the paraclinoid right ICA and cavernous/paraclinoid left ICA. There is occlusion of the distal M1 left middle cerebral artery. There is some reconstitution of flow within M2 and more distal left MCA branch vessels, although asymmetrically decreased as compared to the right. The M1 right middle cerebral artery is patent without significant stenosis. No right M2 proximal branch occlusion or high-grade proximal stenosis is identified. The proximal A1 left anterior cerebral artery is poorly delineated which may be secondary to high-grade stenosis or partial occlusion. The A1 right anterior cerebral artery and more distal anterior cerebral arteries are patent. Posterior circulation: The intracranial vertebral arteries are patent without significant stenosis. The proximal and mid basilar artery is patent without significant stenosis. Thrombus appears to extend in the proximal P1 posterior cerebral arteries bilaterally. There is some reconstitution of flow within the distal P1 and more distal posterior cerebral arteries bilaterally. A small right posterior communicating  artery is present. The left posterior communicating artery is hypoplastic or absent Venous sinuses: Within limitations of contrast timing, no convincing thrombus. Anatomic variants: As described Review of the MIP images confirms the above findings CT Brain Perfusion Findings: ASPECTS: 5 CBF (<30%) Volume: 24mL Perfusion (Tmax>6.0s) volume: 151mL (within the left MCA and PCA vascular territories). Mismatch Volume: 123 mLmL Infarction Location:The left MCA and PCA vascular territories. These findings were discussed at the [place] with Dr. Rory Percy of neurology at the time of image acquisition. The noncontrast head CT was discussed at 12:15 p.m. The CTA head/neck and CT perfusion was discussed at approximately 12:30 p.m. IMPRESSION: CT head: 1. Large region of acute ischemic infarction within the left MCA and PCA vascular territories. Acute ischemic infarction changes are present within the left temporal lobe, left parietal lobe, left occipital lobe, portions of the left insula and left lentiform nucleus inferiorly. ASPECTS 5. Mild associated mass effect at this time. No evidence of hemorrhagic conversion. 2. Background moderate chronic small vessel ischemic changes within the cerebral white matter. 3. Mild generalized parenchymal atrophy. CTA neck: 1. High-grade (greater than 70%) stenosis within the proximal left ICA. 2. The right common and internal carotids are patent within the  neck without significant stenosis. 3. The vertebral arteries are patent within the neck bilaterally without significant stenosis. CTA head: 1. Occlusion of the distal M1 left middle cerebral artery. There is some reconstitution of flow within M2 and more distal left MCA branches, although asymmetrically decreased as compared to the right. 2. The proximal A1 left anterior cerebral artery is poorly delineated. This may be secondary to high-grade stenosis or partial occlusion. 3. Occlusion of the distal basilar artery. Thrombus appears to  extend into the proximal P1 posterior cerebral arteries bilaterally. There is some reconstitution of flow within the distal P1 and more distal posterior cerebral arteries bilaterally. 4. Sites of up to moderate stenosis within the bilateral intracranial internal carotid arteries, as described. CT perfusion head: The perfusion software identifies a 51 mL core infarct within the left MCA and PCA vascular territories. The perfusion software identifies a 174 mL region of hypoperfused parenchyma within the left MCA and PCA vascular territory utilizing the Tmax>6 seconds threshold. Reported mismatch: 123 mL Electronically Signed   By: Kellie Simmering DO   On: 05/30/2020 13:25   CT HEAD CODE STROKE WO CONTRAST  Result Date: 05/30/2020 CLINICAL DATA:  Expressive aphasia, right-sided arm weakness; neuro deficit, acute, stroke suspected. EXAM: CT ANGIOGRAPHY HEAD AND NECK CT PERFUSION BRAIN TECHNIQUE: Multidetector CT imaging of the head and neck was performed using the standard protocol during bolus administration of intravenous contrast. Multiplanar CT image reconstructions and MIPs were obtained to evaluate the vascular anatomy. Carotid stenosis measurements (when applicable) are obtained utilizing NASCET criteria, using the distal internal carotid diameter as the denominator. Multiphase CT imaging of the brain was performed following IV bolus contrast injection. Subsequent parametric perfusion maps were calculated using RAPID software. CONTRAST:  56mL OMNIPAQUE IOHEXOL 350 MG/ML SOLN COMPARISON:  Head CT 03/27/2018, brain MRI 07/03/2017. FINDINGS: CT HEAD FINDINGS Brain: No evidence of acute intracranial hemorrhage. There is a large region of abnormal cortical/subcortical hypodensity within the left temporal, parietal and occipital lobes consistent with acute ischemic infarction. There is also involvement of the left insula and portions of the left lentiform nucleus inferiorly. There is mild associated mass effect at  this time. Background moderate patchy hypoattenuation within the cerebral white matter is nonspecific, but consistent with chronic small vessel ischemic disease. Mild generalized parenchymal atrophy. No extra-axial fluid collection. No evidence of intracranial mass. No midline shift. Vascular: Reported below. Skull: Normal. Negative for fracture or focal lesion. Sinuses/Orbits: Visualized orbits show no acute finding. Mild ethmoid sinus mucosal thickening. ASPECTS ([place] Stroke Program Early CT Score) - Ganglionic level infarction (caudate, lentiform nuclei, internal capsule, insula, M1-M3 cortex): 2 - Supraganglionic infarction (M4-M6 cortex): 3 Total score (0-10 with 10 being normal): 5 Review of the MIP images confirms the above findings CTA NECK FINDINGS Aortic arch: Standard aortic branching. Atherosclerotic plaque within the visualized aortic arch and proximal major branch vessels of the neck. No hemodynamically significant innominate or proximal subclavian artery stenosis. Right carotid system: CCA and ICA patent within the neck without significant stenosis (50% or greater). Mild mixed plaque within the distal CCA, carotid bifurcation and proximal ICA. Left carotid system: CCA and ICA patent within the neck. Prominent calcified plaque within the carotid bifurcation and proximal ICA. Apparent high-grade stenosis of the proximal ICA (greater than 70%). Vertebral arteries: The vertebral arteries are patent within the neck without significant stenosis. The left vertebral artery is subtly dominant. Skeleton: No acute bony abnormality or aggressive osseous lesion. Advanced cervical spondylosis with multilevel disc space narrowing, posterior  disc osteophytes, uncovertebral and facet hypertrophy. Other neck: No neck mass or cervical lymphadenopathy. Thyroid unremarkable. Upper chest: No consolidation within the imaged lung apices. Review of the MIP images confirms the above findings CTA HEAD FINDINGS Anterior  circulation: The intracranial internal carotid arteries are patent. There is diminished opacification of the intracranial left ICA and M1 left MCA, which may be due to downstream occlusion. Calcified plaque within both intracranial ICAs. Sites of up to moderate stenosis within the paraclinoid right ICA and cavernous/paraclinoid left ICA. There is occlusion of the distal M1 left middle cerebral artery. There is some reconstitution of flow within M2 and more distal left MCA branch vessels, although asymmetrically decreased as compared to the right. The M1 right middle cerebral artery is patent without significant stenosis. No right M2 proximal branch occlusion or high-grade proximal stenosis is identified. The proximal A1 left anterior cerebral artery is poorly delineated which may be secondary to high-grade stenosis or partial occlusion. The A1 right anterior cerebral artery and more distal anterior cerebral arteries are patent. Posterior circulation: The intracranial vertebral arteries are patent without significant stenosis. The proximal and mid basilar artery is patent without significant stenosis. Thrombus appears to extend in the proximal P1 posterior cerebral arteries bilaterally. There is some reconstitution of flow within the distal P1 and more distal posterior cerebral arteries bilaterally. A small right posterior communicating artery is present. The left posterior communicating artery is hypoplastic or absent Venous sinuses: Within limitations of contrast timing, no convincing thrombus. Anatomic variants: As described Review of the MIP images confirms the above findings CT Brain Perfusion Findings: ASPECTS: 5 CBF (<30%) Volume: 56mL Perfusion (Tmax>6.0s) volume: 180mL (within the left MCA and PCA vascular territories). Mismatch Volume: 123 mLmL Infarction Location:The left MCA and PCA vascular territories. These findings were discussed at the [place] with Dr. Rory Percy of neurology at the time of image  acquisition. The noncontrast head CT was discussed at 12:15 p.m. The CTA head/neck and CT perfusion was discussed at approximately 12:30 p.m. IMPRESSION: CT head: 1. Large region of acute ischemic infarction within the left MCA and PCA vascular territories. Acute ischemic infarction changes are present within the left temporal lobe, left parietal lobe, left occipital lobe, portions of the left insula and left lentiform nucleus inferiorly. ASPECTS 5. Mild associated mass effect at this time. No evidence of hemorrhagic conversion. 2. Background moderate chronic small vessel ischemic changes within the cerebral white matter. 3. Mild generalized parenchymal atrophy. CTA neck: 1. High-grade (greater than 70%) stenosis within the proximal left ICA. 2. The right common and internal carotids are patent within the neck without significant stenosis. 3. The vertebral arteries are patent within the neck bilaterally without significant stenosis. CTA head: 1. Occlusion of the distal M1 left middle cerebral artery. There is some reconstitution of flow within M2 and more distal left MCA branches, although asymmetrically decreased as compared to the right. 2. The proximal A1 left anterior cerebral artery is poorly delineated. This may be secondary to high-grade stenosis or partial occlusion. 3. Occlusion of the distal basilar artery. Thrombus appears to extend into the proximal P1 posterior cerebral arteries bilaterally. There is some reconstitution of flow within the distal P1 and more distal posterior cerebral arteries bilaterally. 4. Sites of up to moderate stenosis within the bilateral intracranial internal carotid arteries, as described. CT perfusion head: The perfusion software identifies a 51 mL core infarct within the left MCA and PCA vascular territories. The perfusion software identifies a 174 mL region of hypoperfused  parenchyma within the left MCA and PCA vascular territory utilizing the Tmax>6 seconds threshold.  Reported mismatch: 123 mL Electronically Signed   By: Kellie Simmering DO   On: 06/03/2020 13:25    Procedures Procedures (including critical care time)  Medications Ordered in ED Medications  aspirin 81 MG chewable tablet (has no administration in time range)  verapamil (ISOPTIN) 2.5 MG/ML injection (has no administration in time range)  ticagrelor (BRILINTA) 90 MG tablet (has no administration in time range)  clopidogrel (PLAVIX) 300 MG tablet (has no administration in time range)  nitroGLYCERIN 100 mcg/mL intra-arterial injection (has no administration in time range)  tirofiban (AGGRASTAT) 5-0.9 MG/100ML-% injection (has no administration in time range)  eptifibatide (INTEGRILIN) 20 MG/10ML injection (has no administration in time range)  iohexol (OMNIPAQUE) 240 MG/ML injection (has no administration in time range)  verapamil (ISOPTIN) 2.5 MG/ML injection (has no administration in time range)   stroke: mapping our early stages of recovery book (has no administration in time range)  0.9 %  sodium chloride infusion ( Intravenous New Bag/Given 05/22/2020 1504)  acetaminophen (TYLENOL) tablet 650 mg (has no administration in time range)    Or  acetaminophen (TYLENOL) 160 MG/5ML solution 650 mg (has no administration in time range)    Or  acetaminophen (TYLENOL) suppository 650 mg (has no administration in time range)  senna-docusate (Senokot-S) tablet 1 tablet (has no administration in time range)  ceFAZolin (ANCEF) 2-4 GM/100ML-% IVPB (has no administration in time range)  insulin aspart (novoLOG) injection 0-9 Units (has no administration in time range)  iohexol (OMNIPAQUE) 240 MG/ML injection 150 mL (has no administration in time range)  iohexol (OMNIPAQUE) 350 MG/ML injection 75 mL (75 mLs Intravenous Contrast Given 05/28/2020 1224)  fentaNYL (SUBLIMAZE) 100 MCG/2ML injection (  Override pull for Anesthesia 06/10/2020 1358)    ED Course  I have reviewed the triage vital signs and the nursing  notes.  Pertinent labs & imaging results that were available during my care of the patient were reviewed by me and considered in my medical decision making (see chart for details).  79 yo female presenting with AMS, last seen normal at 4 am by her son.  Her presentation was highly concerning to me for a stroke, as she has right sided weakness, a facial droop, and both expressive and comprehensive aphasia. Concern for LVO.  Stroke alert activated.  ECG personally reviewed and nonischemic.  Telemetry showed NSR>  CTH and MRI showed large territorial left MCA infarct.  Patient taken to IR emergently and admitted to stroke service.  Family was not immediately available at the bedside for history.   Clinical Course as of May 31 1746  Wed May 31, 2020  1202 Initial presentation with aphasia, right arm weakness, does not cooperate with remainder of exam, concern for LVO - stroke alert activated.  Last seen well 8 hours ago at 4 am.   [MT]  1314 Pt at MRI now with neurology, reportedly will be going to IR for intervention   [MT]    Clinical Course User Index [MT] Chade Pitner, Carola Rhine, MD   Final Clinical Impression(s) / ED Diagnoses Final diagnoses:  Cerebrovascular accident (CVA), unspecified mechanism [facility])    Rx / DC Orders ED Discharge Orders    None       Jonita Hirota, Carola Rhine, MD 06/17/2020 1747

## 2020-05-31 NOTE — Progress Notes (Signed)
Transported patient from CT, met Kaelyn, RT down in CT after shift change.  Suctioned patient upon return to 4N, no cough at time of suction.

## 2020-05-31 NOTE — Addendum Note (Signed)
Addendum  created 05/30/2020 2158 by Catalina Gravel, MD   Clinical Note Signed

## 2020-05-31 NOTE — Code Documentation (Addendum)
Patient from home where she is typically independent other than assistance with a walker. Patient LKW at 0400 by son. At 1115 he found her unresponsive, incontinent of urine and stool. EMS was called. She was brought in to [facility] with AMS. Once RN did her assessment she noticed right arm weakness, global aphasia, and a left gaze. Pt's NIHSS 16 (see documentation). A code stroke was called by RN and EDP. Pt has a history of stroke and is on Eliquis for Afib. Pt taken to CT. CT/CTA/CTP completed. Based on results pt is LVO+. No TPA r/t on anticoagulation and outside treatment window. Pt taken to STAT MRI per MD orders. Neurologist and IR MD are at pt's side. Pt's son called and gave verbal consent for IR procedure. Code IR called at 1316. Pt taken to [place] [facility] and prepped for procedure. Another pt finishing up in IR suite at the same time. Once procedural room available and pt intubated, pt taken to suite met by the IR team. Hand off: Report given to IR nurse by ED RN. 4N charge RN aware of bed request. Kayren Holck, Rande Brunt, RN SCRN

## 2020-05-31 NOTE — Anesthesia Preprocedure Evaluation (Signed)
Anesthesia Evaluation  Patient identified by MRN, date of birth, ID band Patient awake    Reviewed: Patient's Chart, lab work & pertinent test resultsPreop documentation limited or incomplete due to emergent nature of procedure.  Airway Mallampati: I  TM Distance: >3 FB Neck ROM: Full    Dental   Pulmonary    Pulmonary exam normal        Cardiovascular hypertension, + angina + CAD and + CABG  Normal cardiovascular exam+ dysrhythmias      Neuro/Psych CVA    GI/Hepatic GERD  Medicated and Controlled,  Endo/Other  diabetes, Type 2  Renal/GU      Musculoskeletal   Abdominal   Peds  Hematology   Anesthesia Other Findings   Reproductive/Obstetrics                             Anesthesia Physical Anesthesia Plan  ASA: III  Anesthesia Plan: General   Post-op Pain Management:    Induction: Intravenous  PONV Risk Score and Plan: 3 and Ondansetron, Dexamethasone and Midazolam  Airway Management Planned: Oral ETT  Additional Equipment: Arterial line  Intra-op Plan:   Post-operative Plan: Extubation in OR  Informed Consent: I have reviewed the patients History and Physical, chart, labs and discussed the procedure including the risks, benefits and alternatives for the proposed anesthesia with the patient or authorized representative who has indicated his/her understanding and acceptance.       Plan Discussed with: CRNA and Surgeon  Anesthesia Plan Comments:         Anesthesia Quick Evaluation

## 2020-05-31 NOTE — Progress Notes (Signed)
Loss of cough, gag, corneal reflexes CDS notified 05/31/2020 @ 1951  Referral Number: [id number] [facility]

## 2020-05-31 NOTE — Sedation Documentation (Signed)
Notified Dr Debbrah Alar that all BLE pulses are doppler only, right had much weaker signal than the left side.

## 2020-05-31 NOTE — Anesthesia Postprocedure Evaluation (Addendum)
Anesthesia Post Note  Patient: Audrey Mullen  Procedure(s) Performed: IR WITH ANESTHESIA (N/A )     Patient location during evaluation: SICU Anesthesia Type: General Level of consciousness: sedated and patient remains intubated per anesthesia plan Pain management: pain level controlled Vital Signs Assessment: post-procedure vital signs reviewed and stable Respiratory status: patient remains intubated per anesthesia plan Cardiovascular status: blood pressure returned to baseline Postop Assessment: no apparent nausea or vomiting Anesthetic complications: no   No complications documented.  Last Vitals:  Vitals:   05/26/2020 1933 05/29/2020 1951  BP: (!) 147/63   Pulse: 85   Resp: 19   Temp:  (!) 36.3 C  SpO2:      Last Pain:  Vitals:   06/04/2020 1951  TempSrc: Oral                 Catalina Gravel

## 2020-06-01 ENCOUNTER — Encounter (HOSPITAL_COMMUNITY): Payer: Self-pay | Admitting: Radiology

## 2020-06-08 ENCOUNTER — Encounter (HOSPITAL_COMMUNITY): Payer: Self-pay

## 2020-06-08 HISTORY — PX: IR PERCUTANEOUS ART THROMBECTOMY/INFUSION INTRACRANIAL INC DIAG ANGIO: IMG6087

## 2020-06-18 NOTE — Progress Notes (Signed)
Full Comfort care 6644- all exams deferred. Patient Extubated 2356.

## 2020-06-18 NOTE — Progress Notes (Signed)
Patient Time of Death 33. Verified through Auscultation. Second verification performed by [employer], RN. Family notified, Physician notified.

## 2020-06-18 NOTE — Death Summary Note (Signed)
Stroke Discharge Summary  Patient ID: kashonda sarkisyan   MRN: [id number]      DOB: 08-Apr-1941  Date of Admission: 05/25/2020 Date of Discharge: 06-16-20  Attending Physician:  Amie Portland, MD Consultant(s):   Ina Homes, MD ( pulmonary/intensive care ). Pedro Earls MD (Interventional Neuroradiologist)   Patient's PCP:  Lucianne Lei, MD  DISCHARGE DIAGNOSIS:  Principal Problem:   Acute ischemic stroke ([place]) -  Large L MCA, L PCA infarcts, embolic d/t AF while on AC, s/p IR to L M1 & BA w/ resultant L brain ICH, SAH   LABORATORY STUDIES CBC    Component Value Date/Time   WBC 10.5 05/29/2020 1201   RBC 4.57 05/27/2020 1201   HGB 11.9 (L) 05/19/2020 1952   HGB 10.9 (L) 11/21/2016 0913   HCT 35.0 (L) 06/07/2020 1952   HCT 33.8 (L) 11/21/2016 0913   PLT 157 06/04/2020 1201   PLT 181 11/21/2016 0913   MCV 88.6 06/04/2020 1201   MCV 84 11/21/2016 0913   MCH 26.9 05/30/2020 1201   MCHC 30.4 06/03/2020 1201   RDW 13.7 06/05/2020 1201   RDW 16.8 (H) 11/21/2016 0913   LYMPHSABS 0.9 05/27/2020 1201   LYMPHSABS 1.4 11/21/2016 0913   MONOABS 0.3 05/30/2020 1201   EOSABS 0.0 06/07/2020 1201   EOSABS 0.0 11/21/2016 0913   BASOSABS 0.0 05/28/2020 1201   BASOSABS 0.0 11/21/2016 0913   CMP    Component Value Date/Time   NA 143 06/17/2020 1952   NA 141 11/21/2016 0913   K 3.0 (L) 06/09/2020 1952   CL 106 06/08/2020 1213   CO2 22 06/04/2020 1201   GLUCOSE 192 (H) 06/07/2020 1213   BUN 24 (H) 05/29/2020 1213   BUN 25 11/21/2016 0913   CREATININE 1.20 (H) 05/30/2020 1213   CREATININE 1.30 (H) 05/03/2016 1011   CALCIUM 9.5 05/29/2020 1201   PROT 7.4 05/20/2020 1201   ALBUMIN 4.5 06/17/2020 1201   AST 25 05/26/2020 1201   ALT 16 05/20/2020 1201   ALKPHOS 83 05/30/2020 1201   BILITOT 0.8 05/21/2020 1201   GFRNONAA 41 (L) 06/04/2020 1201   GFRAA 47 (L) 06/17/2020 1201   COAGS Lab Results  Component Value Date   INR 1.1 06/04/2020   INR 1.97  07/05/2017   INR 2.39 07/04/2017   Urinalysis    Component Value Date/Time   COLORURINE COLORLESS (A) 06/15/2020 1833   APPEARANCEUR CLEAR 05/18/2020 1833   LABSPEC 1.024 05/22/2020 1833   PHURINE 7.0 05/20/2020 1833   GLUCOSEU 150 (A) 05/30/2020 1833   HGBUR MODERATE (A) 06/17/2020 1833   BILIRUBINUR NEGATIVE 06/12/2020 1833   KETONESUR 5 (A) 06/17/2020 1833   PROTEINUR NEGATIVE 06/10/2020 1833   UROBILINOGEN 0.2 08/18/2007 1126   NITRITE NEGATIVE 05/19/2020 1833   LEUKOCYTESUR NEGATIVE 06/10/2020 1833   Urine Drug Screen     Component Value Date/Time   LABOPIA NONE DETECTED 06/02/2020 1833   COCAINSCRNUR NONE DETECTED 06/05/2020 1833   LABBENZ NONE DETECTED 05/26/2020 1833   AMPHETMU NONE DETECTED 06/02/2020 1833   THCU NONE DETECTED 06/07/2020 1833   LABBARB NONE DETECTED 06/03/2020 1833    Alcohol Level    Component Value Date/Time   ETH <10 05/18/2020 1201    SIGNIFICANT DIAGNOSTIC STUDIES CT Code Stroke CTA Head W/WO contrast  Result Date: 06/05/2020 CLINICAL DATA:  Expressive aphasia, right-sided arm weakness; neuro deficit, acute, stroke suspected. EXAM: CT ANGIOGRAPHY HEAD AND NECK CT PERFUSION BRAIN TECHNIQUE: Multidetector CT imaging  of the head and neck was performed using the standard protocol during bolus administration of intravenous contrast. Multiplanar CT image reconstructions and MIPs were obtained to evaluate the vascular anatomy. Carotid stenosis measurements (when applicable) are obtained utilizing NASCET criteria, using the distal internal carotid diameter as the denominator. Multiphase CT imaging of the brain was performed following IV bolus contrast injection. Subsequent parametric perfusion maps were calculated using RAPID software. CONTRAST:  61m OMNIPAQUE IOHEXOL 350 MG/ML SOLN COMPARISON:  Head CT 03/27/2018, brain MRI 07/03/2017. FINDINGS: CT HEAD FINDINGS Brain: No evidence of acute intracranial hemorrhage. There is a large region of abnormal  cortical/subcortical hypodensity within the left temporal, parietal and occipital lobes consistent with acute ischemic infarction. There is also involvement of the left insula and portions of the left lentiform nucleus inferiorly. There is mild associated mass effect at this time. Background moderate patchy hypoattenuation within the cerebral white matter is nonspecific, but consistent with chronic small vessel ischemic disease. Mild generalized parenchymal atrophy. No extra-axial fluid collection. No evidence of intracranial mass. No midline shift. Vascular: Reported below. Skull: Normal. Negative for fracture or focal lesion. Sinuses/Orbits: Visualized orbits show no acute finding. Mild ethmoid sinus mucosal thickening. ASPECTS ([place] Early CT Score) - Ganglionic level infarction (caudate, lentiform nuclei, internal capsule, insula, M1-M3 cortex): 2 - Supraganglionic infarction (M4-M6 cortex): 3 Total score (0-10 with 10 being normal): 5 Review of the MIP images confirms the above findings CTA NECK FINDINGS Aortic arch: Standard aortic branching. Atherosclerotic plaque within the visualized aortic arch and proximal major branch vessels of the neck. No hemodynamically significant innominate or proximal subclavian artery stenosis. Right carotid system: CCA and ICA patent within the neck without significant stenosis (50% or greater). Mild mixed plaque within the distal CCA, carotid bifurcation and proximal ICA. Left carotid system: CCA and ICA patent within the neck. Prominent calcified plaque within the carotid bifurcation and proximal ICA. Apparent high-grade stenosis of the proximal ICA (greater than 70%). Vertebral arteries: The vertebral arteries are patent within the neck without significant stenosis. The left vertebral artery is subtly dominant. Skeleton: No acute bony abnormality or aggressive osseous lesion. Advanced cervical spondylosis with multilevel disc space narrowing, posterior disc  osteophytes, uncovertebral and facet hypertrophy. Other neck: No neck mass or cervical lymphadenopathy. Thyroid unremarkable. Upper chest: No consolidation within the imaged lung apices. Review of the MIP images confirms the above findings CTA HEAD FINDINGS Anterior circulation: The intracranial internal carotid arteries are patent. There is diminished opacification of the intracranial left ICA and M1 left MCA, which may be due to downstream occlusion. Calcified plaque within both intracranial ICAs. Sites of up to moderate stenosis within the paraclinoid right ICA and cavernous/paraclinoid left ICA. There is occlusion of the distal M1 left middle cerebral artery. There is some reconstitution of flow within M2 and more distal left MCA branch vessels, although asymmetrically decreased as compared to the right. The M1 right middle cerebral artery is patent without significant stenosis. No right M2 proximal branch occlusion or high-grade proximal stenosis is identified. The proximal A1 left anterior cerebral artery is poorly delineated which may be secondary to high-grade stenosis or partial occlusion. The A1 right anterior cerebral artery and more distal anterior cerebral arteries are patent. Posterior circulation: The intracranial vertebral arteries are patent without significant stenosis. The proximal and mid basilar artery is patent without significant stenosis. Thrombus appears to extend in the proximal P1 posterior cerebral arteries bilaterally. There is some reconstitution of flow within the distal P1 and more  distal posterior cerebral arteries bilaterally. A small right posterior communicating artery is present. The left posterior communicating artery is hypoplastic or absent Venous sinuses: Within limitations of contrast timing, no convincing thrombus. Anatomic variants: As described Review of the MIP images confirms the above findings CT Brain Perfusion Findings: ASPECTS: 5 CBF (<30%) Volume: 77m Perfusion  (Tmax>6.0s) volume: 171m(within the left MCA and PCA vascular territories). Mismatch Volume: 123 mLmL Infarction Location:The left MCA and PCA vascular territories. These findings were discussed at the CTBennettith Dr. ArRory Percyf neurology at the time of image acquisition. The noncontrast head CT was discussed at 12:15 p.m. The CTA head/neck and CT perfusion was discussed at approximately 12:30 p.m. IMPRESSION: CT head: 1. Large region of acute ischemic infarction within the left MCA and PCA vascular territories. Acute ischemic infarction changes are present within the left temporal lobe, left parietal lobe, left occipital lobe, portions of the left insula and left lentiform nucleus inferiorly. ASPECTS 5. Mild associated mass effect at this time. No evidence of hemorrhagic conversion. 2. Background moderate chronic small vessel ischemic changes within the cerebral white matter. 3. Mild generalized parenchymal atrophy. CTA neck: 1. High-grade (greater than 70%) stenosis within the proximal left ICA. 2. The right common and internal carotids are patent within the neck without significant stenosis. 3. The vertebral arteries are patent within the neck bilaterally without significant stenosis. CTA head: 1. Occlusion of the distal M1 left middle cerebral artery. There is some reconstitution of flow within M2 and more distal left MCA branches, although asymmetrically decreased as compared to the right. 2. The proximal A1 left anterior cerebral artery is poorly delineated. This may be secondary to high-grade stenosis or partial occlusion. 3. Occlusion of the distal basilar artery. Thrombus appears to extend into the proximal P1 posterior cerebral arteries bilaterally. There is some reconstitution of flow within the distal P1 and more distal posterior cerebral arteries bilaterally. 4. Sites of up to moderate stenosis within the bilateral intracranial internal carotid arteries, as described. CT perfusion head: The  perfusion software identifies a 51 mL core infarct within the left MCA and PCA vascular territories. The perfusion software identifies a 174 mL region of hypoperfused parenchyma within the left MCA and PCA vascular territory utilizing the Tmax>6 seconds threshold. Reported mismatch: 123 mL Electronically Signed   By: KyKellie SimmeringO   On: 05/19/2020 13:25   DG Chest 1 View  Result Date: 05/28/2020 CLINICAL DATA:  [age]ear old female status post intubation. EXAM: CHEST  1 VIEW COMPARISON:  Chest x-ray 03/27/2018. FINDINGS: An endotracheal tube is in place with tip 2.3 cm above the carina. Lung volumes are slightly low with elevation of the left hemidiaphragm. No acute consolidative airspace disease. No pleural effusions. No pneumothorax. No evidence of pulmonary edema. Heart size is normal. Upper mediastinal contours are within normal limits. Aortic atherosclerosis. Status post median sternotomy for CABG. IMPRESSION: 1. Support apparatus, as above. 2. Low lung volumes with mild elevation of the left hemidiaphragm. 3. Aortic atherosclerosis. Electronically Signed   By: DaVinnie Langton.D.   On: 06/17/2020 19:09   CT HEAD WO CONTRAST  Addendum Date: 06/09/2020   ADDENDUM REPORT: 06/08/2020 19:51 ADDENDUM: These results were called by telephone at the time of interpretation on 06/05/2020 at 7:51 pm to provider [facility] who verbally acknowledged these results. Electronically Signed   By: AsFidela SalisburyD   On: 06/02/2020 19:51   Result Date: 06/15/2020 CLINICAL DATA:  Status post intracranial thrombectomy. Follow-up examination.  EXAM: CT HEAD WITHOUT CONTRAST TECHNIQUE: Contiguous axial images were obtained from the base of the skull through the vertex without intravenous contrast. COMPARISON:  CTA 05/30/2020 FINDINGS: Brain: Since the prior examination, a large amount of subarachnoid high density material is seen diffusely in keeping with a combination of subarachnoid hemorrhage as well as probable  extravasated contrast given recent procedure. There is linear metallic and high density material within the left carotid siphon, ICA terminus, and middle cerebral artery extending into the M3 branches of the insular region likely representing a a combination of coils, glue, and/or wire fragment. There is an intraparenchymal hematoma within the anterior pole of the left temporal lobe measuring 5.5 x 3.3 cm on image # 37/4 there is progressive, mild mass effect noted with effacement of the left lateral ventricle, however, there is no significant midline shift identified. There is cortical hypodensity involving the gray matter of the a left temporal lobe likely representing petechial hemorrhage in the setting of a a MCA distribution infarct. There is, similarly, gray-white matter effacement involving the medial temporal lobe. There is increasing effacement of the suprasellar cistern noted. Vascular: See above. Skull: Intact. Sinuses/Orbits: Orbits are unremarkable. Paranasal sinuses are clear. Endotracheal tube is partially visualized. Other: Mastoid air cells and middle ear cavities are clear. IMPRESSION: Extensive subarachnoid high attenuation material likely representing a combination of extravasated contrast and blood. Large intraparenchymal hematoma/extraluminal contrast collection within the left temporal lobe. Gray-white matter effacement and cortical hyperdensity in keeping with large left MCA territory infarct. Metallic and high density artifact within the intracranial internal carotid artery and left MCA likely relates to vessel occlusion. Increasing left hemispheric mass effect without midline shift at this time. Electronically Signed: By: Fidela Salisbury MD On: 05/30/2020 19:42   CT Code Stroke CTA Neck W/WO contrast  Result Date: 06/14/2020 CLINICAL DATA:  Expressive aphasia, right-sided arm weakness; neuro deficit, acute, stroke suspected. EXAM: CT ANGIOGRAPHY HEAD AND NECK CT PERFUSION BRAIN  TECHNIQUE: Multidetector CT imaging of the head and neck was performed using the standard protocol during bolus administration of intravenous contrast. Multiplanar CT image reconstructions and MIPs were obtained to evaluate the vascular anatomy. Carotid stenosis measurements (when applicable) are obtained utilizing NASCET criteria, using the distal internal carotid diameter as the denominator. Multiphase CT imaging of the brain was performed following IV bolus contrast injection. Subsequent parametric perfusion maps were calculated using RAPID software. CONTRAST:  52m OMNIPAQUE IOHEXOL 350 MG/ML SOLN COMPARISON:  Head CT 03/27/2018, brain MRI 07/03/2017. FINDINGS: CT HEAD FINDINGS Brain: No evidence of acute intracranial hemorrhage. There is a large region of abnormal cortical/subcortical hypodensity within the left temporal, parietal and occipital lobes consistent with acute ischemic infarction. There is also involvement of the left insula and portions of the left lentiform nucleus inferiorly. There is mild associated mass effect at this time. Background moderate patchy hypoattenuation within the cerebral white matter is nonspecific, but consistent with chronic small vessel ischemic disease. Mild generalized parenchymal atrophy. No extra-axial fluid collection. No evidence of intracranial mass. No midline shift. Vascular: Reported below. Skull: Normal. Negative for fracture or focal lesion. Sinuses/Orbits: Visualized orbits show no acute finding. Mild ethmoid sinus mucosal thickening. ASPECTS ([place] Early CT Score) - Ganglionic level infarction (caudate, lentiform nuclei, internal capsule, insula, M1-M3 cortex): 2 - Supraganglionic infarction (M4-M6 cortex): 3 Total score (0-10 with 10 being normal): 5 Review of the MIP images confirms the above findings CTA NECK FINDINGS Aortic arch: Standard aortic branching. Atherosclerotic plaque within the visualized aortic  arch and proximal major branch  vessels of the neck. No hemodynamically significant innominate or proximal subclavian artery stenosis. Right carotid system: CCA and ICA patent within the neck without significant stenosis (50% or greater). Mild mixed plaque within the distal CCA, carotid bifurcation and proximal ICA. Left carotid system: CCA and ICA patent within the neck. Prominent calcified plaque within the carotid bifurcation and proximal ICA. Apparent high-grade stenosis of the proximal ICA (greater than 70%). Vertebral arteries: The vertebral arteries are patent within the neck without significant stenosis. The left vertebral artery is subtly dominant. Skeleton: No acute bony abnormality or aggressive osseous lesion. Advanced cervical spondylosis with multilevel disc space narrowing, posterior disc osteophytes, uncovertebral and facet hypertrophy. Other neck: No neck mass or cervical lymphadenopathy. Thyroid unremarkable. Upper chest: No consolidation within the imaged lung apices. Review of the MIP images confirms the above findings CTA HEAD FINDINGS Anterior circulation: The intracranial internal carotid arteries are patent. There is diminished opacification of the intracranial left ICA and M1 left MCA, which may be due to downstream occlusion. Calcified plaque within both intracranial ICAs. Sites of up to moderate stenosis within the paraclinoid right ICA and cavernous/paraclinoid left ICA. There is occlusion of the distal M1 left middle cerebral artery. There is some reconstitution of flow within M2 and more distal left MCA branch vessels, although asymmetrically decreased as compared to the right. The M1 right middle cerebral artery is patent without significant stenosis. No right M2 proximal branch occlusion or high-grade proximal stenosis is identified. The proximal A1 left anterior cerebral artery is poorly delineated which may be secondary to high-grade stenosis or partial occlusion. The A1 right anterior cerebral artery and more  distal anterior cerebral arteries are patent. Posterior circulation: The intracranial vertebral arteries are patent without significant stenosis. The proximal and mid basilar artery is patent without significant stenosis. Thrombus appears to extend in the proximal P1 posterior cerebral arteries bilaterally. There is some reconstitution of flow within the distal P1 and more distal posterior cerebral arteries bilaterally. A small right posterior communicating artery is present. The left posterior communicating artery is hypoplastic or absent Venous sinuses: Within limitations of contrast timing, no convincing thrombus. Anatomic variants: As described Review of the MIP images confirms the above findings CT Brain Perfusion Findings: ASPECTS: 5 CBF (<30%) Volume: 60m Perfusion (Tmax>6.0s) volume: 1743m(within the left MCA and PCA vascular territories). Mismatch Volume: 123 mLmL Infarction Location:The left MCA and PCA vascular territories. These findings were discussed at the CTShermanith Dr. ArRory Percyf neurology at the time of image acquisition. The noncontrast head CT was discussed at 12:15 p.m. The CTA head/neck and CT perfusion was discussed at approximately 12:30 p.m. IMPRESSION: CT head: 1. Large region of acute ischemic infarction within the left MCA and PCA vascular territories. Acute ischemic infarction changes are present within the left temporal lobe, left parietal lobe, left occipital lobe, portions of the left insula and left lentiform nucleus inferiorly. ASPECTS 5. Mild associated mass effect at this time. No evidence of hemorrhagic conversion. 2. Background moderate chronic small vessel ischemic changes within the cerebral white matter. 3. Mild generalized parenchymal atrophy. CTA neck: 1. High-grade (greater than 70%) stenosis within the proximal left ICA. 2. The right common and internal carotids are patent within the neck without significant stenosis. 3. The vertebral arteries are patent within the  neck bilaterally without significant stenosis. CTA head: 1. Occlusion of the distal M1 left middle cerebral artery. There is some reconstitution of flow within M2 and more  distal left MCA branches, although asymmetrically decreased as compared to the right. 2. The proximal A1 left anterior cerebral artery is poorly delineated. This may be secondary to high-grade stenosis or partial occlusion. 3. Occlusion of the distal basilar artery. Thrombus appears to extend into the proximal P1 posterior cerebral arteries bilaterally. There is some reconstitution of flow within the distal P1 and more distal posterior cerebral arteries bilaterally. 4. Sites of up to moderate stenosis within the bilateral intracranial internal carotid arteries, as described. CT perfusion head: The perfusion software identifies a 51 mL core infarct within the left MCA and PCA vascular territories. The perfusion software identifies a 174 mL region of hypoperfused parenchyma within the left MCA and PCA vascular territory utilizing the Tmax>6 seconds threshold. Reported mismatch: 123 mL Electronically Signed   By: Kellie Simmering DO   On: 06/12/2020 13:25   MR BRAIN WO CONTRAST  Result Date: 06/05/2020 CLINICAL DATA:  Stroke. EXAM: MRI HEAD WITHOUT CONTRAST TECHNIQUE: Multiplanar, multiecho pulse sequences of the brain and surrounding structures were obtained without intravenous contrast. COMPARISON:  CTA head and CT perfusion earlier today FINDINGS: Brain: Large territory acute infarct left MCA territory involving the lateral temporal lobe extending to the lateral parietal lobe. Additional area of acute infarct in the left hippocampus. Additional small areas of acute infarct in the cerebellum on the left. No associated hemorrhage or midline shift. Ventricle size normal. Moderate chronic microvascular ischemic change in the white matter. Small chronic infarct right cerebellum. Negative for mass lesion. Vascular: Abnormal signal distal basilar artery  compatible with thrombus as noted on CTA. Also abnormal signal in the left M2 branch compatible thrombus. Major vessels at the base of the brain show normal flow voids. Skull and upper cervical spine: No focal skeletal lesion. Sinuses/Orbits: Paranasal sinuses clear. Bilateral cataract extraction Other: None IMPRESSION: Acute infarct left MCA territory inferior division. Additional acute infarct in the left hippocampus in the left PCA territory. Small areas of acute infarct in the left cerebellum. No acute hemorrhage identified. Findings consistent with emboli. Electronically Signed   By: Franchot Gallo M.D.   On: 06/16/2020 13:30   IR Transcath/Emboliz  Result Date: Jun 12, 2020 INDICATION: 79 year old with past medical history significant for coronary artery disease, atrial fibrillation on anticoagulation and prior stroke. Who presented with altered mental status and right-sided weakness, NIHSS 18. Baseline modified Rankin scale 2. Head CT showed a large infarct involving the left temporal lobe, posterior left frontal lobe and insula (ASPECTS 5). CT angiogram of the head and neck showed a distal basilar artery occlusion, a left M1 and M2 occlusions. Case discussed with the son by the Neurology team. Given the large core infarct and occlusion of 2 major intracranial vessels, likelihood of good outcome appear unlikely. Son expressed understanding but requested to proceed with intervention. EXAM: Diagnostic cerebral angiogram Mechanical thrombectomy COMPARISON:  CT/CT angiogram of the head May 31, 2020 MEDICATIONS: No antibiotics given. ANESTHESIA/SEDATION: The procedure was performed lesion FLUOROSCOPY TIME:  Fluoroscopy Time: 128 minutes 6 seconds (3530 mGy). COMPLICATIONS: SIR LEVEL D - Requires major therapy, prolonged hospitalization (>48 hours). TECHNIQUE: Informed written consent was obtained from the patient after a thorough discussion of the procedural risks, benefits and alternatives. All questions were  addressed. Maximal Sterile Barrier Technique was utilized including caps, mask, sterile gowns, sterile gloves, sterile drape, hand hygiene and skin antiseptic. A timeout was performed prior to the initiation of the procedure. The left groin was prepped and draped in the usual sterile fashion. Using a micropuncture  kit and the modified Seldinger technique, access was gained to the left common femoral artery and an 8 French sheath was placed. Under fluoroscopy, a Zoom [id number] guide catheter was navigated over a 6 [place] Berenstein 2 catheter and a 0.035" Terumo Glidewire into the aortic arch. The catheter was placed into the left common carotid artery. Frontal and lateral angiograms of the head were obtained. The catheter was advanced into the upper cervical segment of left internal carotid artery. The inner catheter was removed. Frontal and lateral angiograms of the head were obtained. FINDINGS: 1. Severe tortuosity of the cervical segment of the left ICA. 2. Severe atherosclerotic disease and increased tortuosity of the cavernous segment of the left ICA with associated 2 mm atherosclerotic aneurysm and focal area of mild stenosis. 3. Occlusion of the distal left M1/MCA with saddle embolus extending into both M2 segments. Increased tortuosity of the ICA-MCA segment. PROCEDURE: Under biplane roadmap, a zoom 71 aspiration catheter was navigated over a phenom 21 microcatheter and a synchro support microguidewire into the cavernous segment of the left ICA. The microcatheter was then navigated over the wire into the left M2/MCA middle division branch. Then, a 5 x 37 mm embotrap stent retriever was deployed spanning the distal M1 and M2 segment. The device was allowed to intercalated with the clot for 4 minutes. The microcatheter was removed. The aspiration catheter was advanced to the level of occlusion and connected to a penumbra aspiration pump. The thrombectomy device and aspiration catheter were removed under constant  aspiration. Left ICA angiograms showed recanalization of the posterior and middle division branches (TICI 2B) with prominent contrast extravasation along the M2/MCA branches. The phenom 21 microcatheter was navigated over a synchro support microguidewire into the left M2/MCA middle division branch. A 3 mm x 4 cm target ultra coil was deployed followed by a 3 mm x 6 cm hyper soft 3D coil. Stretching of the 2nd coil noted. Attempt to retrieve proved unsuccessful and the coil tail remain in the left M1. The microcatheter was left in place. Follow-up left ICA angiograms with frontal and lateral views showed resolution of the bleed. The right groin was prepped and draped in the usual sterile fashion. Using a micropuncture kit and the modified Seldinger technique, access was gained to the right common femoral artery and an 8 French sheath was placed. Under fluoroscopy, a 5 [place] Berenstein 2 catheter was navigated over a 0.035" Terumo Glidewire into the aortic arch. The catheter was placed into the left subclavian artery and then advanced into the left vertebral artery. An exchange length Terumo glidewire was placed in the distal left V2 segment and the catheter was exchanged for a benchmark guide catheter under biplane roadmap. Frontal and lateral angiograms of the head were then obtained. An occlusion of the distal basilar artery was noted. Under biplane roadmap, a phenom 21 microcatheter was navigated over a synchro support microguidewire into the basilar artery. The microcatheter was then navigated over the wire into the right P2 segment. Then, a 5 x 37 mm embotrap stent retriever was deployed spanning the distal basilar, right P1 and P2 segments. The device was allowed to intercalated with the clot for 4 minutes. The microcatheter was removed. The guiding catheter was connected to an aspiration. The thrombectomy device was removed under constant aspiration. Follow-up left vertebral artery angiogram showed complete  recanalization of the basilar artery with normal anterograde flow in the bilateral PCAs (TICI 3). The benchmark catheter was subsequently withdrawn. Left ICA angiograms were obtained with  frontal and lateral views of the head. Stable occlusion of the middle division branch with maintain opacification of the posterior division branch with stable hemostasis. A synchro support microwire was advanced to the tip of the microcatheter and the microcatheter was retracted to the M1 segment. Follow-up angiograms with frontal and lateral of the head showed stable findings. The microwire was then retracted. Follow-up angiograms with frontal lateral views of the head showed evidence of recently around the occluded vessel. The microcatheter was again advanced into the middle division branch. The microwire was removed. The microcatheter was prepped with dextrose 50 percent. Slow controlling infusion of nBCA was then performed. The microcatheter was removed under constant aspiration. Follow-up angiogram showed resolution of the bleed. Flat panel CT of the head was obtained and post processed in a separate workstation with concurrent attending physician supervision. Selected images were sent to PACS. Subarachnoid hemorrhage/contrast extravasation was noted within the basal cisterns and sylvian fissures, left greater than right. The phenom 21 microcatheter was advanced over the wire into the cavernous segment of the left ICA. A 5 x 37 mm embotrap stent retriever was deployed and retrieved in attempt to retrieve the stretched coil. However, attempt was unsuccessful. Control angiograms showed rebleed at the M2 level. The microcatheter was then advanced into the left M2 posterior division, middle division and anterior division branches and angiograms were obtained via microcatheter contrast injection in each branch. Source of bleeding not identified. Follow-up angiogram with contrast injection from the left ICA showed continued  bleeding. Source appears to come from ruptured small perforator. The microcatheter was again navigated into the middle division branch an additional 4 mm x 6 cm target ultra coil was deployed. An additional 3 mm x 6 cm hyper soft coil was deployed. Stretching was noted at detachment. Follow-up angiogram showed resolution of the lesion. Delayed follow-up angiogram showed stable hemostasis. The catheter was subsequently withdrawn. A right common femoral artery angiogram was obtained via sheath side port. The puncture is at the level of the common femoral artery. Atherosclerotic changes are noted in the common iliac and common femoral artery without hemodynamically significant stenosis. A left common femoral artery angiogram was obtained via sheath side port. The puncture is at the level of the common femoral artery. 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 The left femoral sheath was removed over the wire and an 8 [place] Angio-Seal was utilized for access closure. Immediate hemostasis was achieved. The right femoral sheath was removed over the wire and an 8 [place] Angio-Seal was utilized for access closure. Immediate hemostasis was achieved. IMPRESSION: 1. 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 2. Mechanical thrombectomy for treatment of a left K5/GYB occlusion complicated by hemorrhage likely related to perforator branch stretching, treated with coil and nBCA embolisation. PLAN: - ICU level of care. - Maintaining intubated for airway protection. - SBP 100-120 mmHg. - Son updated by phone and informed of expected poor prognosis. Electronically Signed   By: Pedro Earls M.D.   On: 06-22-2020 13:40   IR Angiogram Follow Up Study  Result Date: 06-22-2020 INDICATION: 79 year old  with past medical history significant for coronary artery disease, atrial fibrillation on anticoagulation and prior stroke. Who presented with altered mental status and right-sided weakness, NIHSS 18. Baseline modified Rankin scale 2. Head CT showed a large infarct involving the left  temporal lobe, posterior left frontal lobe and insula (ASPECTS 5). CT angiogram of the head and neck showed a distal basilar artery occlusion, a left M1 and M2 occlusions. Case discussed with the son by the Neurology team. Given the large core infarct and occlusion of 2 major intracranial vessels, likelihood of good outcome appear unlikely. Son expressed understanding but requested to proceed with intervention. EXAM: Diagnostic cerebral angiogram Mechanical thrombectomy COMPARISON:  CT/CT angiogram of the head May 31, 2020 MEDICATIONS: No antibiotics given. ANESTHESIA/SEDATION: The procedure was performed lesion FLUOROSCOPY TIME:  Fluoroscopy Time: 128 minutes 6 seconds (3530 mGy). COMPLICATIONS: SIR LEVEL D - Requires major therapy, prolonged hospitalization (>48 hours). TECHNIQUE: Informed written consent was obtained from the patient after a thorough discussion of the procedural risks, benefits and alternatives. All questions were addressed. Maximal Sterile Barrier Technique was utilized including caps, mask, sterile gowns, sterile gloves, sterile drape, hand hygiene and skin antiseptic. A timeout was performed prior to the initiation of the procedure. The left groin was prepped and draped in the usual sterile fashion. Using a micropuncture kit and the modified Seldinger technique, access was gained to the left common femoral artery and an 8 French sheath was placed. Under fluoroscopy, a Zoom 88 guide catheter was navigated over a 6 [place] Berenstein 2 catheter and a 0.035" Terumo Glidewire into the aortic arch. The catheter was placed into the left common carotid artery. Frontal and lateral angiograms of the head were obtained. The  catheter was advanced into the upper cervical segment of left internal carotid artery. The inner catheter was removed. Frontal and lateral angiograms of the head were obtained. FINDINGS: 1. Severe tortuosity of the cervical segment of the left ICA. 2. Severe atherosclerotic disease and increased tortuosity of the cavernous segment of the left ICA with associated 2 mm atherosclerotic aneurysm and focal area of mild stenosis. 3. Occlusion of the distal left M1/MCA with saddle embolus extending into both M2 segments. Increased tortuosity of the ICA-MCA segment. PROCEDURE: Under biplane roadmap, a zoom 71 aspiration catheter was navigated over a phenom 21 microcatheter and a synchro support microguidewire into the cavernous segment of the left ICA. The microcatheter was then navigated over the wire into the left M2/MCA middle division branch. Then, a 5 x 37 mm embotrap stent retriever was deployed spanning the distal M1 and M2 segment. The device was allowed to intercalated with the clot for 4 minutes. The microcatheter was removed. The aspiration catheter was advanced to the level of occlusion and connected to a penumbra aspiration pump. The thrombectomy device and aspiration catheter were removed under constant aspiration. Left ICA angiograms showed recanalization of the posterior and middle division branches (TICI 2B) with prominent contrast extravasation along the M2/MCA branches. The phenom 21 microcatheter was navigated over a synchro support microguidewire into the left M2/MCA middle division branch. A 3 mm x 4 cm target ultra coil was deployed followed by a 3 mm x 6 cm hyper soft 3D coil. Stretching of the 2nd coil noted. Attempt to retrieve proved unsuccessful and the coil tail remain in the left M1. The microcatheter was left in place. Follow-up left ICA angiograms with frontal and lateral views showed resolution of the bleed. The right groin was prepped and draped in the usual sterile fashion. Using a  micropuncture kit and the modified Seldinger technique, access was gained to the right common femoral artery and an 8 French sheath was placed. Under fluoroscopy, a 5 [place] Berenstein 2 catheter was navigated over a 0.035" Terumo  Glidewire into the aortic arch. The catheter was placed into the left subclavian artery and then advanced into the left vertebral artery. An exchange length Terumo glidewire was placed in the distal left V2 segment and the catheter was exchanged for a benchmark guide catheter under biplane roadmap. Frontal and lateral angiograms of the head were then obtained. An occlusion of the distal basilar artery was noted. Under biplane roadmap, a phenom 21 microcatheter was navigated over a synchro support microguidewire into the basilar artery. The microcatheter was then navigated over the wire into the right P2 segment. Then, a 5 x 37 mm embotrap stent retriever was deployed spanning the distal basilar, right P1 and P2 segments. The device was allowed to intercalated with the clot for 4 minutes. The microcatheter was removed. The guiding catheter was connected to an aspiration. The thrombectomy device was removed under constant aspiration. Follow-up left vertebral artery angiogram showed complete recanalization of the basilar artery with normal anterograde flow in the bilateral PCAs (TICI 3). The benchmark catheter was subsequently withdrawn. Left ICA angiograms were obtained with frontal and lateral views of the head. Stable occlusion of the middle division branch with maintain opacification of the posterior division branch with stable hemostasis. A synchro support microwire was advanced to the tip of the microcatheter and the microcatheter was retracted to the M1 segment. Follow-up angiograms with frontal and lateral of the head showed stable findings. The microwire was then retracted. Follow-up angiograms with frontal lateral views of the head showed evidence of recently around the occluded  vessel. The microcatheter was again advanced into the middle division branch. The microwire was removed. The microcatheter was prepped with dextrose 50 percent. Slow controlling infusion of nBCA was then performed. The microcatheter was removed under constant aspiration. Follow-up angiogram showed resolution of the bleed. Flat panel CT of the head was obtained and post processed in a separate workstation with concurrent attending physician supervision. Selected images were sent to PACS. Subarachnoid hemorrhage/contrast extravasation was noted within the basal cisterns and sylvian fissures, left greater than right. The phenom 21 microcatheter was advanced over the wire into the cavernous segment of the left ICA. A 5 x 37 mm embotrap stent retriever was deployed and retrieved in attempt to retrieve the stretched coil. However, attempt was unsuccessful. Control angiograms showed rebleed at the M2 level. The microcatheter was then advanced into the left M2 posterior division, middle division and anterior division branches and angiograms were obtained via microcatheter contrast injection in each branch. Source of bleeding not identified. Follow-up angiogram with contrast injection from the left ICA showed continued bleeding. Source appears to come from ruptured small perforator. The microcatheter was again navigated into the middle division branch an additional 4 mm x 6 cm target ultra coil was deployed. An additional 3 mm x 6 cm hyper soft coil was deployed. Stretching was noted at detachment. Follow-up angiogram showed resolution of the lesion. Delayed follow-up angiogram showed stable hemostasis. The catheter was subsequently withdrawn. A right common femoral artery angiogram was obtained via sheath side port. The puncture is at the level of the common femoral artery. Atherosclerotic changes are noted in the common iliac and common femoral artery without hemodynamically significant stenosis. A left common femoral  artery angiogram was obtained via sheath side port. The puncture is at the level of the common femoral artery. Atherosclerotic changes are noted in the visualized lower abdominal aorta, common left iliac, internal and external iliac arteries and common femoral artery with fusiform aneurysm noted in the  internal iliac artery. The left femoral sheath was removed over the wire and an 8 [place] Angio-Seal was utilized for access closure. Immediate hemostasis was achieved. The right femoral sheath was removed over the wire and an 8 [place] Angio-Seal was utilized for access closure. Immediate hemostasis was achieved. IMPRESSION: 1. 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 2. Mechanical thrombectomy for treatment of a left K5/VVZ occlusion complicated by hemorrhage likely related to perforator branch stretching, treated with coil and nBCA embolisation. PLAN: - ICU level of care. - Maintaining intubated for airway protection. - SBP 100-120 mmHg. - Son updated by phone and informed of expected poor prognosis. Electronically Signed   By: Pedro Earls M.D.   On: 2020/06/11 13:40   IR Angiogram Follow Up Study  Result Date: 06-11-2020 INDICATION: 79 year old with past medical history significant for coronary artery disease, atrial fibrillation on anticoagulation and prior stroke. Who presented with altered mental status and right-sided weakness, NIHSS 18. Baseline modified Rankin scale 2. Head CT showed a large infarct involving the left temporal lobe, posterior left frontal lobe and insula (ASPECTS 5). CT angiogram of the head and neck showed a distal basilar artery occlusion, a left M1 and M2 occlusions. Case discussed with the son by the Neurology team. Given the large core infarct and occlusion of 2 major intracranial vessels, likelihood of good outcome appear unlikely. Son expressed understanding but requested to proceed with intervention. EXAM: Diagnostic  cerebral angiogram Mechanical thrombectomy COMPARISON:  CT/CT angiogram of the head May 31, 2020 MEDICATIONS: No antibiotics given. ANESTHESIA/SEDATION: The procedure was performed lesion FLUOROSCOPY TIME:  Fluoroscopy Time: 128 minutes 6 seconds (3530 mGy). COMPLICATIONS: SIR LEVEL D - Requires major therapy, prolonged hospitalization (>48 hours). TECHNIQUE: Informed written consent was obtained from the patient after a thorough discussion of the procedural risks, benefits and alternatives. All questions were addressed. Maximal Sterile Barrier Technique was utilized including caps, mask, sterile gowns, sterile gloves, sterile drape, hand hygiene and skin antiseptic. A timeout was performed prior to the initiation of the procedure. The left groin was prepped and draped in the usual sterile fashion. Using a micropuncture kit and the modified Seldinger technique, access was gained to the left common femoral artery and an 8 French sheath was placed. Under fluoroscopy, a Zoom 88 guide catheter was navigated over a 6 [place] Berenstein 2 catheter and a 0.035" Terumo Glidewire into the aortic arch. The catheter was placed into the left common carotid artery. Frontal and lateral angiograms of the head were obtained. The catheter was advanced into the upper cervical segment of left internal carotid artery. The inner catheter was removed. Frontal and lateral angiograms of the head were obtained. FINDINGS: 1. Severe tortuosity of the cervical segment of the left ICA. 2. Severe atherosclerotic disease and increased tortuosity of the cavernous segment of the left ICA with associated 2 mm atherosclerotic aneurysm and focal area of mild stenosis. 3. Occlusion of the distal left M1/MCA with saddle embolus extending into both M2 segments. Increased tortuosity of the ICA-MCA segment. PROCEDURE: Under biplane roadmap, a zoom 71 aspiration catheter was navigated over a phenom 21 microcatheter and a synchro support microguidewire into  the cavernous segment of the left ICA. The microcatheter was then navigated over the wire into the left M2/MCA middle division branch. Then, a 5 x 37 mm embotrap stent retriever was deployed spanning the distal M1 and M2 segment. The device was allowed to intercalated with the clot for 4 minutes. The microcatheter  was removed. The aspiration catheter was advanced to the level of occlusion and connected to a penumbra aspiration pump. The thrombectomy device and aspiration catheter were removed under constant aspiration. Left ICA angiograms showed recanalization of the posterior and middle division branches (TICI 2B) with prominent contrast extravasation along the M2/MCA branches. The phenom 21 microcatheter was navigated over a synchro support microguidewire into the left M2/MCA middle division branch. A 3 mm x 4 cm target ultra coil was deployed followed by a 3 mm x 6 cm hyper soft 3D coil. Stretching of the 2nd coil noted. Attempt to retrieve proved unsuccessful and the coil tail remain in the left M1. The microcatheter was left in place. Follow-up left ICA angiograms with frontal and lateral views showed resolution of the bleed. The right groin was prepped and draped in the usual sterile fashion. Using a micropuncture kit and the modified Seldinger technique, access was gained to the right common femoral artery and an 8 French sheath was placed. Under fluoroscopy, a 5 [place] Berenstein 2 catheter was navigated over a 0.035" Terumo Glidewire into the aortic arch. The catheter was placed into the left subclavian artery and then advanced into the left vertebral artery. An exchange length Terumo glidewire was placed in the distal left V2 segment and the catheter was exchanged for a benchmark guide catheter under biplane roadmap. Frontal and lateral angiograms of the head were then obtained. An occlusion of the distal basilar artery was noted. Under biplane roadmap, a phenom 21 microcatheter was navigated over a  synchro support microguidewire into the basilar artery. The microcatheter was then navigated over the wire into the right P2 segment. Then, a 5 x 37 mm embotrap stent retriever was deployed spanning the distal basilar, right P1 and P2 segments. The device was allowed to intercalated with the clot for 4 minutes. The microcatheter was removed. The guiding catheter was connected to an aspiration. The thrombectomy device was removed under constant aspiration. Follow-up left vertebral artery angiogram showed complete recanalization of the basilar artery with normal anterograde flow in the bilateral PCAs (TICI 3). The benchmark catheter was subsequently withdrawn. Left ICA angiograms were obtained with frontal and lateral views of the head. Stable occlusion of the middle division branch with maintain opacification of the posterior division branch with stable hemostasis. A synchro support microwire was advanced to the tip of the microcatheter and the microcatheter was retracted to the M1 segment. Follow-up angiograms with frontal and lateral of the head showed stable findings. The microwire was then retracted. Follow-up angiograms with frontal lateral views of the head showed evidence of recently around the occluded vessel. The microcatheter was again advanced into the middle division branch. The microwire was removed. The microcatheter was prepped with dextrose 50 percent. Slow controlling infusion of nBCA was then performed. The microcatheter was removed under constant aspiration. Follow-up angiogram showed resolution of the bleed. Flat panel CT of the head was obtained and post processed in a separate workstation with concurrent attending physician supervision. Selected images were sent to PACS. Subarachnoid hemorrhage/contrast extravasation was noted within the basal cisterns and sylvian fissures, left greater than right. The phenom 21 microcatheter was advanced over the wire into the cavernous segment of the left ICA.  A 5 x 37 mm embotrap stent retriever was deployed and retrieved in attempt to retrieve the stretched coil. However, attempt was unsuccessful. Control angiograms showed rebleed at the M2 level. The microcatheter was then advanced into the left M2 posterior division, middle division and anterior division  branches and angiograms were obtained via microcatheter contrast injection in each branch. Source of bleeding not identified. Follow-up angiogram with contrast injection from the left ICA showed continued bleeding. Source appears to come from ruptured small perforator. The microcatheter was again navigated into the middle division branch an additional 4 mm x 6 cm target ultra coil was deployed. An additional 3 mm x 6 cm hyper soft coil was deployed. Stretching was noted at detachment. Follow-up angiogram showed resolution of the lesion. Delayed follow-up angiogram showed stable hemostasis. The catheter was subsequently withdrawn. A right common femoral artery angiogram was obtained via sheath side port. The puncture is at the level of the common femoral artery. Atherosclerotic changes are noted in the common iliac and common femoral artery without hemodynamically significant stenosis. A left common femoral artery angiogram was obtained via sheath side port. The puncture is at the level of the common femoral artery. 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 The left femoral sheath was removed over the wire and an 8 [place] Angio-Seal was utilized for access closure. Immediate hemostasis was achieved. The right femoral sheath was removed over the wire and an 8 [place] Angio-Seal was utilized for access closure. Immediate hemostasis was achieved. IMPRESSION: 1. 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 2.  Mechanical thrombectomy for treatment of a left Y0/VPX occlusion complicated by hemorrhage likely related to perforator branch stretching, treated with coil and nBCA embolisation. PLAN: - ICU level of care. - Maintaining intubated for airway protection. - SBP 100-120 mmHg. - Son updated by phone and informed of expected poor prognosis. Electronically Signed   By: Pedro Earls M.D.   On: June 26, 2020 13:40   IR Angiogram Follow Up Study  Result Date: June 26, 2020 INDICATION: 79 year old with past medical history significant for coronary artery disease, atrial fibrillation on anticoagulation and prior stroke. Who presented with altered mental status and right-sided weakness, NIHSS 18. Baseline modified Rankin scale 2. Head CT showed a large infarct involving the left temporal lobe, posterior left frontal lobe and insula (ASPECTS 5). CT angiogram of the head and neck showed a distal basilar artery occlusion, a left M1 and M2 occlusions. Case discussed with the son by the Neurology team. Given the large core infarct and occlusion of 2 major intracranial vessels, likelihood of good outcome appear unlikely. Son expressed understanding but requested to proceed with intervention. EXAM: Diagnostic cerebral angiogram Mechanical thrombectomy COMPARISON:  CT/CT angiogram of the head May 31, 2020 MEDICATIONS: No antibiotics given. ANESTHESIA/SEDATION: The procedure was performed lesion FLUOROSCOPY TIME:  Fluoroscopy Time: 128 minutes 6 seconds (3530 mGy). COMPLICATIONS: SIR LEVEL D - Requires major therapy, prolonged hospitalization (>48 hours). TECHNIQUE: Informed written consent was obtained from the patient after a thorough discussion of the procedural risks, benefits and alternatives. All questions were addressed. Maximal Sterile Barrier Technique was utilized including caps, mask, sterile gowns, sterile gloves, sterile drape, hand hygiene and skin antiseptic. A timeout was performed prior to the initiation  of the procedure. The left groin was prepped and draped in the usual sterile fashion. Using a micropuncture kit and the modified Seldinger technique, access was gained to the left common femoral artery and an 8 French sheath was placed. Under fluoroscopy, a Zoom 88 guide catheter was navigated over a 6 [place] Berenstein 2 catheter and a 0.035" Terumo Glidewire into the aortic  arch. The catheter was placed into the left common carotid artery. Frontal and lateral angiograms of the head were obtained. The catheter was advanced into the upper cervical segment of left internal carotid artery. The inner catheter was removed. Frontal and lateral angiograms of the head were obtained. FINDINGS: 1. Severe tortuosity of the cervical segment of the left ICA. 2. Severe atherosclerotic disease and increased tortuosity of the cavernous segment of the left ICA with associated 2 mm atherosclerotic aneurysm and focal area of mild stenosis. 3. Occlusion of the distal left M1/MCA with saddle embolus extending into both M2 segments. Increased tortuosity of the ICA-MCA segment. PROCEDURE: Under biplane roadmap, a zoom 71 aspiration catheter was navigated over a phenom 21 microcatheter and a synchro support microguidewire into the cavernous segment of the left ICA. The microcatheter was then navigated over the wire into the left M2/MCA middle division branch. Then, a 5 x 37 mm embotrap stent retriever was deployed spanning the distal M1 and M2 segment. The device was allowed to intercalated with the clot for 4 minutes. The microcatheter was removed. The aspiration catheter was advanced to the level of occlusion and connected to a penumbra aspiration pump. The thrombectomy device and aspiration catheter were removed under constant aspiration. Left ICA angiograms showed recanalization of the posterior and middle division branches (TICI 2B) with prominent contrast extravasation along the M2/MCA branches. The phenom 21 microcatheter was  navigated over a synchro support microguidewire into the left M2/MCA middle division branch. A 3 mm x 4 cm target ultra coil was deployed followed by a 3 mm x 6 cm hyper soft 3D coil. Stretching of the 2nd coil noted. Attempt to retrieve proved unsuccessful and the coil tail remain in the left M1. The microcatheter was left in place. Follow-up left ICA angiograms with frontal and lateral views showed resolution of the bleed. The right groin was prepped and draped in the usual sterile fashion. Using a micropuncture kit and the modified Seldinger technique, access was gained to the right common femoral artery and an 8 French sheath was placed. Under fluoroscopy, a 5 [place] Berenstein 2 catheter was navigated over a 0.035" Terumo Glidewire into the aortic arch. The catheter was placed into the left subclavian artery and then advanced into the left vertebral artery. An exchange length Terumo glidewire was placed in the distal left V2 segment and the catheter was exchanged for a benchmark guide catheter under biplane roadmap. Frontal and lateral angiograms of the head were then obtained. An occlusion of the distal basilar artery was noted. Under biplane roadmap, a phenom 21 microcatheter was navigated over a synchro support microguidewire into the basilar artery. The microcatheter was then navigated over the wire into the right P2 segment. Then, a 5 x 37 mm embotrap stent retriever was deployed spanning the distal basilar, right P1 and P2 segments. The device was allowed to intercalated with the clot for 4 minutes. The microcatheter was removed. The guiding catheter was connected to an aspiration. The thrombectomy device was removed under constant aspiration. Follow-up left vertebral artery angiogram showed complete recanalization of the basilar artery with normal anterograde flow in the bilateral PCAs (TICI 3). The benchmark catheter was subsequently withdrawn. Left ICA angiograms were obtained with frontal and lateral  views of the head. Stable occlusion of the middle division branch with maintain opacification of the posterior division branch with stable hemostasis. A synchro support microwire was advanced to the tip of the microcatheter and the microcatheter was retracted to the M1 segment.  Follow-up angiograms with frontal and lateral of the head showed stable findings. The microwire was then retracted. Follow-up angiograms with frontal lateral views of the head showed evidence of recently around the occluded vessel. The microcatheter was again advanced into the middle division branch. The microwire was removed. The microcatheter was prepped with dextrose 50 percent. Slow controlling infusion of nBCA was then performed. The microcatheter was removed under constant aspiration. Follow-up angiogram showed resolution of the bleed. Flat panel CT of the head was obtained and post processed in a separate workstation with concurrent attending physician supervision. Selected images were sent to PACS. Subarachnoid hemorrhage/contrast extravasation was noted within the basal cisterns and sylvian fissures, left greater than right. The phenom 21 microcatheter was advanced over the wire into the cavernous segment of the left ICA. A 5 x 37 mm embotrap stent retriever was deployed and retrieved in attempt to retrieve the stretched coil. However, attempt was unsuccessful. Control angiograms showed rebleed at the M2 level. The microcatheter was then advanced into the left M2 posterior division, middle division and anterior division branches and angiograms were obtained via microcatheter contrast injection in each branch. Source of bleeding not identified. Follow-up angiogram with contrast injection from the left ICA showed continued bleeding. Source appears to come from ruptured small perforator. The microcatheter was again navigated into the middle division branch an additional 4 mm x 6 cm target ultra coil was deployed. An additional 3 mm x 6  cm hyper soft coil was deployed. Stretching was noted at detachment. Follow-up angiogram showed resolution of the lesion. Delayed follow-up angiogram showed stable hemostasis. The catheter was subsequently withdrawn. A right common femoral artery angiogram was obtained via sheath side port. The puncture is at the level of the common femoral artery. Atherosclerotic changes are noted in the common iliac and common femoral artery without hemodynamically significant stenosis. A left common femoral artery angiogram was obtained via sheath side port. The puncture is at the level of the common femoral artery. 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 The left femoral sheath was removed over the wire and an 8 [place] Angio-Seal was utilized for access closure. Immediate hemostasis was achieved. The right femoral sheath was removed over the wire and an 8 [place] Angio-Seal was utilized for access closure. Immediate hemostasis was achieved. IMPRESSION: 1. 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 2. Mechanical thrombectomy for treatment of a left Y8/MVH occlusion complicated by hemorrhage likely related to perforator branch stretching, treated with coil and nBCA embolisation. PLAN: - ICU level of care. - Maintaining intubated for airway protection. - SBP 100-120 mmHg. - Son updated by phone and informed of expected poor prognosis. Electronically Signed   By: Pedro Earls M.D.   On: 06/16/2020 13:40   IR Angiogram Follow Up Study  Result Date: 2020-06-16 INDICATION: 79 year old with past medical history significant for coronary artery disease, atrial fibrillation on anticoagulation and prior stroke. Who presented with altered mental status and right-sided weakness, NIHSS 18. Baseline modified  Rankin scale 2. Head CT showed a large infarct involving the left temporal lobe, posterior left frontal lobe and insula (ASPECTS 5). CT angiogram of the head and neck showed a distal basilar artery occlusion, a left M1 and M2 occlusions. Case discussed with the son by the Neurology team. Given the large core infarct and occlusion of  2 major intracranial vessels, likelihood of good outcome appear unlikely. Son expressed understanding but requested to proceed with intervention. EXAM: Diagnostic cerebral angiogram Mechanical thrombectomy COMPARISON:  CT/CT angiogram of the head May 31, 2020 MEDICATIONS: No antibiotics given. ANESTHESIA/SEDATION: The procedure was performed lesion FLUOROSCOPY TIME:  Fluoroscopy Time: 128 minutes 6 seconds (3530 mGy). COMPLICATIONS: SIR LEVEL D - Requires major therapy, prolonged hospitalization (>48 hours). TECHNIQUE: Informed written consent was obtained from the patient after a thorough discussion of the procedural risks, benefits and alternatives. All questions were addressed. Maximal Sterile Barrier Technique was utilized including caps, mask, sterile gowns, sterile gloves, sterile drape, hand hygiene and skin antiseptic. A timeout was performed prior to the initiation of the procedure. The left groin was prepped and draped in the usual sterile fashion. Using a micropuncture kit and the modified Seldinger technique, access was gained to the left common femoral artery and an 8 French sheath was placed. Under fluoroscopy, a Zoom 88 guide catheter was navigated over a 6 [place] Berenstein 2 catheter and a 0.035" Terumo Glidewire into the aortic arch. The catheter was placed into the left common carotid artery. Frontal and lateral angiograms of the head were obtained. The catheter was advanced into the upper cervical segment of left internal carotid artery. The inner catheter was removed. Frontal and lateral angiograms of the head were obtained. FINDINGS: 1. Severe tortuosity of the  cervical segment of the left ICA. 2. Severe atherosclerotic disease and increased tortuosity of the cavernous segment of the left ICA with associated 2 mm atherosclerotic aneurysm and focal area of mild stenosis. 3. Occlusion of the distal left M1/MCA with saddle embolus extending into both M2 segments. Increased tortuosity of the ICA-MCA segment. PROCEDURE: Under biplane roadmap, a zoom 71 aspiration catheter was navigated over a phenom 21 microcatheter and a synchro support microguidewire into the cavernous segment of the left ICA. The microcatheter was then navigated over the wire into the left M2/MCA middle division branch. Then, a 5 x 37 mm embotrap stent retriever was deployed spanning the distal M1 and M2 segment. The device was allowed to intercalated with the clot for 4 minutes. The microcatheter was removed. The aspiration catheter was advanced to the level of occlusion and connected to a penumbra aspiration pump. The thrombectomy device and aspiration catheter were removed under constant aspiration. Left ICA angiograms showed recanalization of the posterior and middle division branches (TICI 2B) with prominent contrast extravasation along the M2/MCA branches. The phenom 21 microcatheter was navigated over a synchro support microguidewire into the left M2/MCA middle division branch. A 3 mm x 4 cm target ultra coil was deployed followed by a 3 mm x 6 cm hyper soft 3D coil. Stretching of the 2nd coil noted. Attempt to retrieve proved unsuccessful and the coil tail remain in the left M1. The microcatheter was left in place. Follow-up left ICA angiograms with frontal and lateral views showed resolution of the bleed. The right groin was prepped and draped in the usual sterile fashion. Using a micropuncture kit and the modified Seldinger technique, access was gained to the right common femoral artery and an 8 French sheath was placed. Under fluoroscopy, a 5 [place] Berenstein 2 catheter was navigated over a  0.035" Terumo Glidewire into the aortic arch. The catheter was placed into the left subclavian artery and then advanced into the left vertebral artery. An exchange length Terumo glidewire was placed in the distal left V2 segment and the catheter was exchanged for a benchmark guide catheter under  biplane roadmap. Frontal and lateral angiograms of the head were then obtained. An occlusion of the distal basilar artery was noted. Under biplane roadmap, a phenom 21 microcatheter was navigated over a synchro support microguidewire into the basilar artery. The microcatheter was then navigated over the wire into the right P2 segment. Then, a 5 x 37 mm embotrap stent retriever was deployed spanning the distal basilar, right P1 and P2 segments. The device was allowed to intercalated with the clot for 4 minutes. The microcatheter was removed. The guiding catheter was connected to an aspiration. The thrombectomy device was removed under constant aspiration. Follow-up left vertebral artery angiogram showed complete recanalization of the basilar artery with normal anterograde flow in the bilateral PCAs (TICI 3). The benchmark catheter was subsequently withdrawn. Left ICA angiograms were obtained with frontal and lateral views of the head. Stable occlusion of the middle division branch with maintain opacification of the posterior division branch with stable hemostasis. A synchro support microwire was advanced to the tip of the microcatheter and the microcatheter was retracted to the M1 segment. Follow-up angiograms with frontal and lateral of the head showed stable findings. The microwire was then retracted. Follow-up angiograms with frontal lateral views of the head showed evidence of recently around the occluded vessel. The microcatheter was again advanced into the middle division branch. The microwire was removed. The microcatheter was prepped with dextrose 50 percent. Slow controlling infusion of nBCA was then performed. The  microcatheter was removed under constant aspiration. Follow-up angiogram showed resolution of the bleed. Flat panel CT of the head was obtained and post processed in a separate workstation with concurrent attending physician supervision. Selected images were sent to PACS. Subarachnoid hemorrhage/contrast extravasation was noted within the basal cisterns and sylvian fissures, left greater than right. The phenom 21 microcatheter was advanced over the wire into the cavernous segment of the left ICA. A 5 x 37 mm embotrap stent retriever was deployed and retrieved in attempt to retrieve the stretched coil. However, attempt was unsuccessful. Control angiograms showed rebleed at the M2 level. The microcatheter was then advanced into the left M2 posterior division, middle division and anterior division branches and angiograms were obtained via microcatheter contrast injection in each branch. Source of bleeding not identified. Follow-up angiogram with contrast injection from the left ICA showed continued bleeding. Source appears to come from ruptured small perforator. The microcatheter was again navigated into the middle division branch an additional 4 mm x 6 cm target ultra coil was deployed. An additional 3 mm x 6 cm hyper soft coil was deployed. Stretching was noted at detachment. Follow-up angiogram showed resolution of the lesion. Delayed follow-up angiogram showed stable hemostasis. The catheter was subsequently withdrawn. A right common femoral artery angiogram was obtained via sheath side port. The puncture is at the level of the common femoral artery. Atherosclerotic changes are noted in the common iliac and common femoral artery without hemodynamically significant stenosis. A left common femoral artery angiogram was obtained via sheath side port. The puncture is at the level of the common femoral artery. 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 The left femoral sheath was removed over the wire and an 8 [place] Angio-Seal was utilized for access closure. Immediate hemostasis was achieved. The right femoral sheath was removed over the wire and an 8 [place] Angio-Seal was utilized for access closure. Immediate  hemostasis was achieved. IMPRESSION: 1. 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 2. Mechanical thrombectomy for treatment of a left Q5/ZDG occlusion complicated by hemorrhage likely related to perforator branch stretching, treated with coil and nBCA embolisation. PLAN: - ICU level of care. - Maintaining intubated for airway protection. - SBP 100-120 mmHg. - Son updated by phone and informed of expected poor prognosis. Electronically Signed   By: Pedro Earls M.D.   On: 20-Jun-2020 13:40   IR Angiogram Follow Up Study  Result Date: 06/20/20 INDICATION: 79 year old with past medical history significant for coronary artery disease, atrial fibrillation on anticoagulation and prior stroke. Who presented with altered mental status and right-sided weakness, NIHSS 18. Baseline modified Rankin scale 2. Head CT showed a large infarct involving the left temporal lobe, posterior left frontal lobe and insula (ASPECTS 5). CT angiogram of the head and neck showed a distal basilar artery occlusion, a left M1 and M2 occlusions. Case discussed with the son by the Neurology team. Given the large core infarct and occlusion of 2 major intracranial vessels, likelihood of good outcome appear unlikely. Son expressed understanding but requested to proceed with intervention. EXAM: Diagnostic cerebral angiogram Mechanical thrombectomy COMPARISON:  CT/CT angiogram of the head May 31, 2020 MEDICATIONS: No antibiotics given. ANESTHESIA/SEDATION: The procedure was performed lesion FLUOROSCOPY TIME:   Fluoroscopy Time: 128 minutes 6 seconds (3530 mGy). COMPLICATIONS: SIR LEVEL D - Requires major therapy, prolonged hospitalization (>48 hours). TECHNIQUE: Informed written consent was obtained from the patient after a thorough discussion of the procedural risks, benefits and alternatives. All questions were addressed. Maximal Sterile Barrier Technique was utilized including caps, mask, sterile gowns, sterile gloves, sterile drape, hand hygiene and skin antiseptic. A timeout was performed prior to the initiation of the procedure. The left groin was prepped and draped in the usual sterile fashion. Using a micropuncture kit and the modified Seldinger technique, access was gained to the left common femoral artery and an 8 French sheath was placed. Under fluoroscopy, a Zoom 88 guide catheter was navigated over a 6 [place] Berenstein 2 catheter and a 0.035" Terumo Glidewire into the aortic arch. The catheter was placed into the left common carotid artery. Frontal and lateral angiograms of the head were obtained. The catheter was advanced into the upper cervical segment of left internal carotid artery. The inner catheter was removed. Frontal and lateral angiograms of the head were obtained. FINDINGS: 1. Severe tortuosity of the cervical segment of the left ICA. 2. Severe atherosclerotic disease and increased tortuosity of the cavernous segment of the left ICA with associated 2 mm atherosclerotic aneurysm and focal area of mild stenosis. 3. Occlusion of the distal left M1/MCA with saddle embolus extending into both M2 segments. Increased tortuosity of the ICA-MCA segment. PROCEDURE: Under biplane roadmap, a zoom 71 aspiration catheter was navigated over a phenom 21 microcatheter and a synchro support microguidewire into the cavernous segment of the left ICA. The microcatheter was then navigated over the wire into the left M2/MCA middle division branch. Then, a 5 x 37 mm embotrap stent retriever was deployed spanning the  distal M1 and M2 segment. The device was allowed to intercalated with the clot for 4 minutes. The microcatheter was removed. The aspiration catheter was advanced to the level of occlusion and connected to a penumbra aspiration pump. The thrombectomy device and aspiration catheter were removed under constant aspiration. Left ICA angiograms showed recanalization of the posterior and middle division branches (TICI 2B) with prominent  contrast extravasation along the M2/MCA branches. The phenom 21 microcatheter was navigated over a synchro support microguidewire into the left M2/MCA middle division branch. A 3 mm x 4 cm target ultra coil was deployed followed by a 3 mm x 6 cm hyper soft 3D coil. Stretching of the 2nd coil noted. Attempt to retrieve proved unsuccessful and the coil tail remain in the left M1. The microcatheter was left in place. Follow-up left ICA angiograms with frontal and lateral views showed resolution of the bleed. The right groin was prepped and draped in the usual sterile fashion. Using a micropuncture kit and the modified Seldinger technique, access was gained to the right common femoral artery and an 8 French sheath was placed. Under fluoroscopy, a 5 [place] Berenstein 2 catheter was navigated over a 0.035" Terumo Glidewire into the aortic arch. The catheter was placed into the left subclavian artery and then advanced into the left vertebral artery. An exchange length Terumo glidewire was placed in the distal left V2 segment and the catheter was exchanged for a benchmark guide catheter under biplane roadmap. Frontal and lateral angiograms of the head were then obtained. An occlusion of the distal basilar artery was noted. Under biplane roadmap, a phenom 21 microcatheter was navigated over a synchro support microguidewire into the basilar artery. The microcatheter was then navigated over the wire into the right P2 segment. Then, a 5 x 37 mm embotrap stent retriever was deployed spanning the distal  basilar, right P1 and P2 segments. The device was allowed to intercalated with the clot for 4 minutes. The microcatheter was removed. The guiding catheter was connected to an aspiration. The thrombectomy device was removed under constant aspiration. Follow-up left vertebral artery angiogram showed complete recanalization of the basilar artery with normal anterograde flow in the bilateral PCAs (TICI 3). The benchmark catheter was subsequently withdrawn. Left ICA angiograms were obtained with frontal and lateral views of the head. Stable occlusion of the middle division branch with maintain opacification of the posterior division branch with stable hemostasis. A synchro support microwire was advanced to the tip of the microcatheter and the microcatheter was retracted to the M1 segment. Follow-up angiograms with frontal and lateral of the head showed stable findings. The microwire was then retracted. Follow-up angiograms with frontal lateral views of the head showed evidence of recently around the occluded vessel. The microcatheter was again advanced into the middle division branch. The microwire was removed. The microcatheter was prepped with dextrose 50 percent. Slow controlling infusion of nBCA was then performed. The microcatheter was removed under constant aspiration. Follow-up angiogram showed resolution of the bleed. Flat panel CT of the head was obtained and post processed in a separate workstation with concurrent attending physician supervision. Selected images were sent to PACS. Subarachnoid hemorrhage/contrast extravasation was noted within the basal cisterns and sylvian fissures, left greater than right. The phenom 21 microcatheter was advanced over the wire into the cavernous segment of the left ICA. A 5 x 37 mm embotrap stent retriever was deployed and retrieved in attempt to retrieve the stretched coil. However, attempt was unsuccessful. Control angiograms showed rebleed at the M2 level. The  microcatheter was then advanced into the left M2 posterior division, middle division and anterior division branches and angiograms were obtained via microcatheter contrast injection in each branch. Source of bleeding not identified. Follow-up angiogram with contrast injection from the left ICA showed continued bleeding. Source appears to come from ruptured small perforator. The microcatheter was again navigated into the middle division  branch an additional 4 mm x 6 cm target ultra coil was deployed. An additional 3 mm x 6 cm hyper soft coil was deployed. Stretching was noted at detachment. Follow-up angiogram showed resolution of the lesion. Delayed follow-up angiogram showed stable hemostasis. The catheter was subsequently withdrawn. A right common femoral artery angiogram was obtained via sheath side port. The puncture is at the level of the common femoral artery. Atherosclerotic changes are noted in the common iliac and common femoral artery without hemodynamically significant stenosis. A left common femoral artery angiogram was obtained via sheath side port. The puncture is at the level of the common femoral artery. 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 The left femoral sheath was removed over the wire and an 8 [place] Angio-Seal was utilized for access closure. Immediate hemostasis was achieved. The right femoral sheath was removed over the wire and an 8 [place] Angio-Seal was utilized for access closure. Immediate hemostasis was achieved. IMPRESSION: 1. 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 2. Mechanical thrombectomy for treatment of a left T3/MIW occlusion complicated by hemorrhage likely related to perforator branch stretching, treated with coil and nBCA embolisation. PLAN: - ICU level  of care. - Maintaining intubated for airway protection. - SBP 100-120 mmHg. - Son updated by phone and informed of expected poor prognosis. Electronically Signed   By: Pedro Earls M.D.   On: 2020-06-30 13:40   IR CT Head Ltd  Result Date: 30-Jun-2020 INDICATION: 79 year old with past medical history significant for coronary artery disease, atrial fibrillation on anticoagulation and prior stroke. Who presented with altered mental status and right-sided weakness, NIHSS 18. Baseline modified Rankin scale 2. Head CT showed a large infarct involving the left temporal lobe, posterior left frontal lobe and insula (ASPECTS 5). CT angiogram of the head and neck showed a distal basilar artery occlusion, a left M1 and M2 occlusions. Case discussed with the son by the Neurology team. Given the large core infarct and occlusion of 2 major intracranial vessels, likelihood of good outcome appear unlikely. Son expressed understanding but requested to proceed with intervention. EXAM: Diagnostic cerebral angiogram Mechanical thrombectomy COMPARISON:  CT/CT angiogram of the head May 31, 2020 MEDICATIONS: No antibiotics given. ANESTHESIA/SEDATION: The procedure was performed lesion FLUOROSCOPY TIME:  Fluoroscopy Time: 128 minutes 6 seconds (3530 mGy). COMPLICATIONS: SIR LEVEL D - Requires major therapy, prolonged hospitalization (>48 hours). TECHNIQUE: Informed written consent was obtained from the patient after a thorough discussion of the procedural risks, benefits and alternatives. All questions were addressed. Maximal Sterile Barrier Technique was utilized including caps, mask, sterile gowns, sterile gloves, sterile drape, hand hygiene and skin antiseptic. A timeout was performed prior to the initiation of the procedure. The left groin was prepped and draped in the usual sterile fashion. Using a micropuncture kit and the modified Seldinger technique, access was gained to the left common femoral artery and an 8  French sheath was placed. Under fluoroscopy, a Zoom 88 guide catheter was navigated over a 6 [place] Berenstein 2 catheter and a 0.035" Terumo Glidewire into the aortic arch. The catheter was placed into the left common carotid artery. Frontal and lateral angiograms of the head were obtained. The catheter was advanced into the upper cervical segment of left internal carotid artery. The inner catheter was removed. Frontal and lateral angiograms of the head were  obtained. FINDINGS: 1. Severe tortuosity of the cervical segment of the left ICA. 2. Severe atherosclerotic disease and increased tortuosity of the cavernous segment of the left ICA with associated 2 mm atherosclerotic aneurysm and focal area of mild stenosis. 3. Occlusion of the distal left M1/MCA with saddle embolus extending into both M2 segments. Increased tortuosity of the ICA-MCA segment. PROCEDURE: Under biplane roadmap, a zoom 71 aspiration catheter was navigated over a phenom 21 microcatheter and a synchro support microguidewire into the cavernous segment of the left ICA. The microcatheter was then navigated over the wire into the left M2/MCA middle division branch. Then, a 5 x 37 mm embotrap stent retriever was deployed spanning the distal M1 and M2 segment. The device was allowed to intercalated with the clot for 4 minutes. The microcatheter was removed. The aspiration catheter was advanced to the level of occlusion and connected to a penumbra aspiration pump. The thrombectomy device and aspiration catheter were removed under constant aspiration. Left ICA angiograms showed recanalization of the posterior and middle division branches (TICI 2B) with prominent contrast extravasation along the M2/MCA branches. The phenom 21 microcatheter was navigated over a synchro support microguidewire into the left M2/MCA middle division branch. A 3 mm x 4 cm target ultra coil was deployed followed by a 3 mm x 6 cm hyper soft 3D coil. Stretching of the 2nd coil  noted. Attempt to retrieve proved unsuccessful and the coil tail remain in the left M1. The microcatheter was left in place. Follow-up left ICA angiograms with frontal and lateral views showed resolution of the bleed. The right groin was prepped and draped in the usual sterile fashion. Using a micropuncture kit and the modified Seldinger technique, access was gained to the right common femoral artery and an 8 French sheath was placed. Under fluoroscopy, a 5 [place] Berenstein 2 catheter was navigated over a 0.035" Terumo Glidewire into the aortic arch. The catheter was placed into the left subclavian artery and then advanced into the left vertebral artery. An exchange length Terumo glidewire was placed in the distal left V2 segment and the catheter was exchanged for a benchmark guide catheter under biplane roadmap. Frontal and lateral angiograms of the head were then obtained. An occlusion of the distal basilar artery was noted. Under biplane roadmap, a phenom 21 microcatheter was navigated over a synchro support microguidewire into the basilar artery. The microcatheter was then navigated over the wire into the right P2 segment. Then, a 5 x 37 mm embotrap stent retriever was deployed spanning the distal basilar, right P1 and P2 segments. The device was allowed to intercalated with the clot for 4 minutes. The microcatheter was removed. The guiding catheter was connected to an aspiration. The thrombectomy device was removed under constant aspiration. Follow-up left vertebral artery angiogram showed complete recanalization of the basilar artery with normal anterograde flow in the bilateral PCAs (TICI 3). The benchmark catheter was subsequently withdrawn. Left ICA angiograms were obtained with frontal and lateral views of the head. Stable occlusion of the middle division branch with maintain opacification of the posterior division branch with stable hemostasis. A synchro support microwire was advanced to the tip of the  microcatheter and the microcatheter was retracted to the M1 segment. Follow-up angiograms with frontal and lateral of the head showed stable findings. The microwire was then retracted. Follow-up angiograms with frontal lateral views of the head showed evidence of recently around the occluded vessel. The microcatheter was again advanced into the middle division branch. The microwire was  removed. The microcatheter was prepped with dextrose 50 percent. Slow controlling infusion of nBCA was then performed. The microcatheter was removed under constant aspiration. Follow-up angiogram showed resolution of the bleed. Flat panel CT of the head was obtained and post processed in a separate workstation with concurrent attending physician supervision. Selected images were sent to PACS. Subarachnoid hemorrhage/contrast extravasation was noted within the basal cisterns and sylvian fissures, left greater than right. The phenom 21 microcatheter was advanced over the wire into the cavernous segment of the left ICA. A 5 x 37 mm embotrap stent retriever was deployed and retrieved in attempt to retrieve the stretched coil. However, attempt was unsuccessful. Control angiograms showed rebleed at the M2 level. The microcatheter was then advanced into the left M2 posterior division, middle division and anterior division branches and angiograms were obtained via microcatheter contrast injection in each branch. Source of bleeding not identified. Follow-up angiogram with contrast injection from the left ICA showed continued bleeding. Source appears to come from ruptured small perforator. The microcatheter was again navigated into the middle division branch an additional 4 mm x 6 cm target ultra coil was deployed. An additional 3 mm x 6 cm hyper soft coil was deployed. Stretching was noted at detachment. Follow-up angiogram showed resolution of the lesion. Delayed follow-up angiogram showed stable hemostasis. The catheter was subsequently  withdrawn. A right common femoral artery angiogram was obtained via sheath side port. The puncture is at the level of the common femoral artery. Atherosclerotic changes are noted in the common iliac and common femoral artery without hemodynamically significant stenosis. A left common femoral artery angiogram was obtained via sheath side port. The puncture is at the level of the common femoral artery. 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 The left femoral sheath was removed over the wire and an 8 [place] Angio-Seal was utilized for access closure. Immediate hemostasis was achieved. The right femoral sheath was removed over the wire and an 8 [place] Angio-Seal was utilized for access closure. Immediate hemostasis was achieved. IMPRESSION: 1. 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 2. Mechanical thrombectomy for treatment of a left T5/TDD occlusion complicated by hemorrhage likely related to perforator branch stretching, treated with coil and nBCA embolisation. PLAN: - ICU level of care. - Maintaining intubated for airway protection. - SBP 100-120 mmHg. - Son updated by phone and informed of expected poor prognosis. Electronically Signed   By: Pedro Earls M.D.   On: 06-29-20 13:40   CT Code Stroke Cerebral Perfusion with contrast  Result Date: 06/03/2020 CLINICAL DATA:  Expressive aphasia, right-sided arm weakness; neuro deficit, acute, stroke suspected. EXAM: CT ANGIOGRAPHY HEAD AND NECK CT PERFUSION BRAIN TECHNIQUE: Multidetector CT imaging of the head and neck was performed using the standard protocol during bolus administration of intravenous contrast. Multiplanar CT image reconstructions and MIPs were obtained to evaluate the vascular anatomy. Carotid stenosis  measurements (when applicable) are obtained utilizing NASCET criteria, using the distal internal carotid diameter as the denominator. Multiphase CT imaging of the brain was performed following IV bolus contrast injection. Subsequent parametric perfusion maps were calculated using RAPID software. CONTRAST:  67m OMNIPAQUE IOHEXOL 350 MG/ML SOLN COMPARISON:  Head CT 03/27/2018, brain MRI 07/03/2017. FINDINGS: CT HEAD FINDINGS Brain: No evidence of acute intracranial hemorrhage. There is a large region of abnormal cortical/subcortical hypodensity within  the left temporal, parietal and occipital lobes consistent with acute ischemic infarction. There is also involvement of the left insula and portions of the left lentiform nucleus inferiorly. There is mild associated mass effect at this time. Background moderate patchy hypoattenuation within the cerebral white matter is nonspecific, but consistent with chronic small vessel ischemic disease. Mild generalized parenchymal atrophy. No extra-axial fluid collection. No evidence of intracranial mass. No midline shift. Vascular: Reported below. Skull: Normal. Negative for fracture or focal lesion. Sinuses/Orbits: Visualized orbits show no acute finding. Mild ethmoid sinus mucosal thickening. ASPECTS ([place] Stroke Program Early CT Score) - Ganglionic level infarction (caudate, lentiform nuclei, internal capsule, insula, M1-M3 cortex): 2 - Supraganglionic infarction (M4-M6 cortex): 3 Total score (0-10 with 10 being normal): 5 Review of the MIP images confirms the above findings CTA NECK FINDINGS Aortic arch: Standard aortic branching. Atherosclerotic plaque within the visualized aortic arch and proximal major branch vessels of the neck. No hemodynamically significant innominate or proximal subclavian artery stenosis. Right carotid system: CCA and ICA patent within the neck without significant stenosis (50% or greater). Mild mixed plaque within the distal CCA, carotid bifurcation  and proximal ICA. Left carotid system: CCA and ICA patent within the neck. Prominent calcified plaque within the carotid bifurcation and proximal ICA. Apparent high-grade stenosis of the proximal ICA (greater than 70%). Vertebral arteries: The vertebral arteries are patent within the neck without significant stenosis. The left vertebral artery is subtly dominant. Skeleton: No acute bony abnormality or aggressive osseous lesion. Advanced cervical spondylosis with multilevel disc space narrowing, posterior disc osteophytes, uncovertebral and facet hypertrophy. Other neck: No neck mass or cervical lymphadenopathy. Thyroid unremarkable. Upper chest: No consolidation within the imaged lung apices. Review of the MIP images confirms the above findings CTA HEAD FINDINGS Anterior circulation: The intracranial internal carotid arteries are patent. There is diminished opacification of the intracranial left ICA and M1 left MCA, which may be due to downstream occlusion. Calcified plaque within both intracranial ICAs. Sites of up to moderate stenosis within the paraclinoid right ICA and cavernous/paraclinoid left ICA. There is occlusion of the distal M1 left middle cerebral artery. There is some reconstitution of flow within M2 and more distal left MCA branch vessels, although asymmetrically decreased as compared to the right. The M1 right middle cerebral artery is patent without significant stenosis. No right M2 proximal branch occlusion or high-grade proximal stenosis is identified. The proximal A1 left anterior cerebral artery is poorly delineated which may be secondary to high-grade stenosis or partial occlusion. The A1 right anterior cerebral artery and more distal anterior cerebral arteries are patent. Posterior circulation: The intracranial vertebral arteries are patent without significant stenosis. The proximal and mid basilar artery is patent without significant stenosis. Thrombus appears to extend in the proximal P1  posterior cerebral arteries bilaterally. There is some reconstitution of flow within the distal P1 and more distal posterior cerebral arteries bilaterally. A small right posterior communicating artery is present. The left posterior communicating artery is hypoplastic or absent Venous sinuses: Within limitations of contrast timing, no convincing thrombus. Anatomic variants: As described Review of the MIP images confirms the above findings CT Brain Perfusion Findings: ASPECTS: 5 CBF (<30%) Volume: 46m Perfusion (Tmax>6.0s) volume: 1743m(within the left MCA and PCA vascular territories). Mismatch Volume: 123 mLmL Infarction Location:The left MCA and PCA vascular territories. These findings [place] [facility] neurology at the time of image acquisition. The noncontrast head CT was discussed at 12:15 p.m. The  CTA head/neck and CT perfusion was discussed at approximately 12:30 p.m. IMPRESSION: CT head: 1. Large region of acute ischemic infarction within the left MCA and PCA vascular territories. Acute ischemic infarction changes are present within the left temporal lobe, left parietal lobe, left occipital lobe, portions of the left insula and left lentiform nucleus inferiorly. ASPECTS 5. Mild associated mass effect at this time. No evidence of hemorrhagic conversion. 2. Background moderate chronic small vessel ischemic changes within the cerebral white matter. 3. Mild generalized parenchymal atrophy. CTA neck: 1. High-grade (greater than 70%) stenosis within the proximal left ICA. 2. The right common and internal carotids are patent within the neck without significant stenosis. 3. The vertebral arteries are patent within the neck bilaterally without significant stenosis. CTA head: 1. Occlusion of the distal M1 left middle cerebral artery. There is some reconstitution of flow within M2 and more distal left MCA branches, although asymmetrically decreased as compared to the right. 2. The  proximal A1 left anterior cerebral artery is poorly delineated. This may be secondary to high-grade stenosis or partial occlusion. 3. Occlusion of the distal basilar artery. Thrombus appears to extend into the proximal P1 posterior cerebral arteries bilaterally. There is some reconstitution of flow within the distal P1 and more distal posterior cerebral arteries bilaterally. 4. Sites of up to moderate stenosis within the bilateral intracranial internal carotid arteries, as described. CT perfusion head: The perfusion software identifies a 51 mL core infarct within the left MCA and PCA vascular territories. The perfusion software identifies a 174 mL region of hypoperfused parenchyma within the left MCA and PCA vascular territory utilizing the Tmax>6 seconds threshold. Reported mismatch: 123 mL Electronically Signed   By: Kellie Simmering DO   On: 06/15/2020 13:25   IR PERCUTANEOUS ART THROMBECTOMY/INFUSION INTRACRANIAL INC DIAG ANGIO  Result Date: 06-22-2020 INDICATION: 79 year old with past medical history significant for coronary artery disease, atrial fibrillation on anticoagulation and prior stroke. Who presented with altered mental status and right-sided weakness, NIHSS 18. Baseline modified Rankin scale 2. Head CT showed a large infarct involving the left temporal lobe, posterior left frontal lobe and insula (ASPECTS 5). CT angiogram of the head and neck showed a distal basilar artery occlusion, a left M1 and M2 occlusions. Case discussed with the son by the Neurology team. Given the large core infarct and occlusion of 2 major intracranial vessels, likelihood of good outcome appear unlikely. Son expressed understanding but requested to proceed with intervention. EXAM: Diagnostic cerebral angiogram Mechanical thrombectomy COMPARISON:  CT/CT angiogram of the head May 31, 2020 MEDICATIONS: No antibiotics given. ANESTHESIA/SEDATION: The procedure was performed lesion FLUOROSCOPY TIME:  Fluoroscopy Time: 128 minutes  6 seconds (3530 mGy). COMPLICATIONS: SIR LEVEL D - Requires major therapy, prolonged hospitalization (>48 hours). TECHNIQUE: Informed written consent was obtained from the patient after a thorough discussion of the procedural risks, benefits and alternatives. All questions were addressed. Maximal Sterile Barrier Technique was utilized including caps, mask, sterile gowns, sterile gloves, sterile drape, hand hygiene and skin antiseptic. A timeout was performed prior to the initiation of the procedure. The left groin was prepped and draped in the usual sterile fashion. Using a micropuncture kit and the modified Seldinger technique, access was gained to the left common femoral artery and an 8 French sheath was placed. Under fluoroscopy, a Zoom 88 guide catheter was navigated over a 6 [place] Berenstein 2 catheter and a 0.035" Terumo Glidewire into the aortic arch. The catheter was placed into the left common carotid artery. Frontal and  lateral angiograms of the head were obtained. The catheter was advanced into the upper cervical segment of left internal carotid artery. The inner catheter was removed. Frontal and lateral angiograms of the head were obtained. FINDINGS: 1. Severe tortuosity of the cervical segment of the left ICA. 2. Severe atherosclerotic disease and increased tortuosity of the cavernous segment of the left ICA with associated 2 mm atherosclerotic aneurysm and focal area of mild stenosis. 3. Occlusion of the distal left M1/MCA with saddle embolus extending into both M2 segments. Increased tortuosity of the ICA-MCA segment. PROCEDURE: Under biplane roadmap, a zoom 71 aspiration catheter was navigated over a phenom 21 microcatheter and a synchro support microguidewire into the cavernous segment of the left ICA. The microcatheter was then navigated over the wire into the left M2/MCA middle division branch. Then, a 5 x 37 mm embotrap stent retriever was deployed spanning the distal M1 and M2 segment. The  device was allowed to intercalated with the clot for 4 minutes. The microcatheter was removed. The aspiration catheter was advanced to the level of occlusion and connected to a penumbra aspiration pump. The thrombectomy device and aspiration catheter were removed under constant aspiration. Left ICA angiograms showed recanalization of the posterior and middle division branches (TICI 2B) with prominent contrast extravasation along the M2/MCA branches. The phenom 21 microcatheter was navigated over a synchro support microguidewire into the left M2/MCA middle division branch. A 3 mm x 4 cm target ultra coil was deployed followed by a 3 mm x 6 cm hyper soft 3D coil. Stretching of the 2nd coil noted. Attempt to retrieve proved unsuccessful and the coil tail remain in the left M1. The microcatheter was left in place. Follow-up left ICA angiograms with frontal and lateral views showed resolution of the bleed. The right groin was prepped and draped in the usual sterile fashion. Using a micropuncture kit and the modified Seldinger technique, access was gained to the right common femoral artery and an 8 French sheath was placed. Under fluoroscopy, a 5 [place] Berenstein 2 catheter was navigated over a 0.035" Terumo Glidewire into the aortic arch. The catheter was placed into the left subclavian artery and then advanced into the left vertebral artery. An exchange length Terumo glidewire was placed in the distal left V2 segment and the catheter was exchanged for a benchmark guide catheter under biplane roadmap. Frontal and lateral angiograms of the head were then obtained. An occlusion of the distal basilar artery was noted. Under biplane roadmap, a phenom 21 microcatheter was navigated over a synchro support microguidewire into the basilar artery. The microcatheter was then navigated over the wire into the right P2 segment. Then, a 5 x 37 mm embotrap stent retriever was deployed spanning the distal basilar, right P1 and P2  segments. The device was allowed to intercalated with the clot for 4 minutes. The microcatheter was removed. The guiding catheter was connected to an aspiration. The thrombectomy device was removed under constant aspiration. Follow-up left vertebral artery angiogram showed complete recanalization of the basilar artery with normal anterograde flow in the bilateral PCAs (TICI 3). The benchmark catheter was subsequently withdrawn. Left ICA angiograms were obtained with frontal and lateral views of the head. Stable occlusion of the middle division branch with maintain opacification of the posterior division branch with stable hemostasis. A synchro support microwire was advanced to the tip of the microcatheter and the microcatheter was retracted to the M1 segment. Follow-up angiograms with frontal and lateral of the head showed stable findings. The  microwire was then retracted. Follow-up angiograms with frontal lateral views of the head showed evidence of recently around the occluded vessel. The microcatheter was again advanced into the middle division branch. The microwire was removed. The microcatheter was prepped with dextrose 50 percent. Slow controlling infusion of nBCA was then performed. The microcatheter was removed under constant aspiration. Follow-up angiogram showed resolution of the bleed. Flat panel CT of the head was obtained and post processed in a separate workstation with concurrent attending physician supervision. Selected images were sent to PACS. Subarachnoid hemorrhage/contrast extravasation was noted within the basal cisterns and sylvian fissures, left greater than right. The phenom 21 microcatheter was advanced over the wire into the cavernous segment of the left ICA. A 5 x 37 mm embotrap stent retriever was deployed and retrieved in attempt to retrieve the stretched coil. However, attempt was unsuccessful. Control angiograms showed rebleed at the M2 level. The microcatheter was then advanced into  the left M2 posterior division, middle division and anterior division branches and angiograms were obtained via microcatheter contrast injection in each branch. Source of bleeding not identified. Follow-up angiogram with contrast injection from the left ICA showed continued bleeding. Source appears to come from ruptured small perforator. The microcatheter was again navigated into the middle division branch an additional 4 mm x 6 cm target ultra coil was deployed. An additional 3 mm x 6 cm hyper soft coil was deployed. Stretching was noted at detachment. Follow-up angiogram showed resolution of the lesion. Delayed follow-up angiogram showed stable hemostasis. The catheter was subsequently withdrawn. A right common femoral artery angiogram was obtained via sheath side port. The puncture is at the level of the common femoral artery. Atherosclerotic changes are noted in the common iliac and common femoral artery without hemodynamically significant stenosis. A left common femoral artery angiogram was obtained via sheath side port. The puncture is at the level of the common femoral artery. 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 The left femoral sheath was removed over the wire and an 8 [place] Angio-Seal was utilized for access closure. Immediate hemostasis was achieved. The right femoral sheath was removed over the wire and an 8 [place] Angio-Seal was utilized for access closure. Immediate hemostasis was achieved. IMPRESSION: 1. 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 2. Mechanical thrombectomy for treatment of a left J2/INO occlusion complicated by hemorrhage likely related to perforator branch stretching, treated with coil and nBCA embolisation. PLAN: - ICU level of care. - Maintaining intubated for  airway protection. - SBP 100-120 mmHg. - Son updated by phone and informed of expected poor prognosis. Electronically Signed   By: Pedro Earls M.D.   On: Jun 29, 2020 13:40   CT HEAD CODE STROKE WO CONTRAST  Result Date: 05/21/2020 CLINICAL DATA:  Expressive aphasia, right-sided arm weakness; neuro deficit, acute, stroke suspected. EXAM: CT ANGIOGRAPHY HEAD AND NECK CT PERFUSION BRAIN TECHNIQUE: Multidetector CT imaging of the head and neck was performed using the standard protocol during bolus administration of intravenous contrast. Multiplanar CT image reconstructions and MIPs were obtained to evaluate the vascular anatomy. Carotid stenosis measurements (when applicable) are obtained utilizing NASCET criteria, using the distal internal carotid diameter as the denominator. Multiphase CT imaging of the brain was performed following IV bolus contrast injection. Subsequent parametric perfusion maps were calculated using RAPID software. CONTRAST:  32m OMNIPAQUE  IOHEXOL 350 MG/ML SOLN COMPARISON:  Head CT 03/27/2018, brain MRI 07/03/2017. FINDINGS: CT HEAD FINDINGS Brain: No evidence of acute intracranial hemorrhage. There is a large region of abnormal cortical/subcortical hypodensity within the left temporal, parietal and occipital lobes consistent with acute ischemic infarction. There is also involvement of the left insula and portions of the left lentiform nucleus inferiorly. There is mild associated mass effect at this time. Background moderate patchy hypoattenuation within the cerebral white matter is nonspecific, but consistent with chronic small vessel ischemic disease. Mild generalized parenchymal atrophy. No extra-axial fluid collection. No evidence of intracranial mass. No midline shift. Vascular: Reported below. Skull: Normal. Negative for fracture or focal lesion. Sinuses/Orbits: Visualized orbits show no acute finding. Mild ethmoid sinus mucosal thickening. ASPECTS ([facility] Stroke  Program Early CT Score) - Ganglionic level infarction (caudate, lentiform nuclei, internal capsule, insula, M1-M3 cortex): 2 - Supraganglionic infarction (M4-M6 cortex): 3 Total score (0-10 with 10 being normal): 5 Review of the MIP images confirms the above findings CTA NECK FINDINGS Aortic arch: Standard aortic branching. Atherosclerotic plaque within the visualized aortic arch and proximal major branch vessels of the neck. No hemodynamically significant innominate or proximal subclavian artery stenosis. Right carotid system: CCA and ICA patent within the neck without significant stenosis (50% or greater). Mild mixed plaque within the distal CCA, carotid bifurcation and proximal ICA. Left carotid system: CCA and ICA patent within the neck. Prominent calcified plaque within the carotid bifurcation and proximal ICA. Apparent high-grade stenosis of the proximal ICA (greater than 70%). Vertebral arteries: The vertebral arteries are patent within the neck without significant stenosis. The left vertebral artery is subtly dominant. Skeleton: No acute bony abnormality or aggressive osseous lesion. Advanced cervical spondylosis with multilevel disc space narrowing, posterior disc osteophytes, uncovertebral and facet hypertrophy. Other neck: No neck mass or cervical lymphadenopathy. Thyroid unremarkable. Upper chest: No consolidation within the imaged lung apices. Review of the MIP images confirms the above findings CTA HEAD FINDINGS Anterior circulation: The intracranial internal carotid arteries are patent. There is diminished opacification of the intracranial left ICA and M1 left MCA, which may be due to downstream occlusion. Calcified plaque within both intracranial ICAs. Sites of up to moderate stenosis within the paraclinoid right ICA and cavernous/paraclinoid left ICA. There is occlusion of the distal M1 left middle cerebral artery. There is some reconstitution of flow within M2 and more distal left MCA branch  vessels, although asymmetrically decreased as compared to the right. The M1 right middle cerebral artery is patent without significant stenosis. No right M2 proximal branch occlusion or high-grade proximal stenosis is identified. The proximal A1 left anterior cerebral artery is poorly delineated which may be secondary to high-grade stenosis or partial occlusion. The A1 right anterior cerebral artery and more distal anterior cerebral arteries are patent. Posterior circulation: The intracranial vertebral arteries are patent without significant stenosis. The proximal and mid basilar artery is patent without significant stenosis. Thrombus appears to extend in the proximal P1 posterior cerebral arteries bilaterally. There is some reconstitution of flow within the distal P1 and more distal posterior cerebral arteries bilaterally. A small right posterior communicating artery is present. The left posterior communicating artery is hypoplastic or absent Venous sinuses: Within limitations of contrast timing, no convincing thrombus. Anatomic variants: As described Review of the MIP images confirms the above findings CT Brain Perfusion Findings: ASPECTS: 5 CBF (<30%) Volume: 109m Perfusion (Tmax>6.0s) volume: 1777m(within the left MCA and PCA vascular territories). Mismatch Volume: 123 mLmL Infarction Location:The left MCA  and PCA vascular territories. These findings were discussed at the [facility] with Dr. Rory Percy of neurology at the time of image acquisition. The noncontrast head CT was discussed at 12:15 p.m. The CTA head/neck and CT perfusion was discussed at approximately 12:30 p.m. IMPRESSION: CT head: 1. Large region of acute ischemic infarction within the left MCA and PCA vascular territories. Acute ischemic infarction changes are present within the left temporal lobe, left parietal lobe, left occipital lobe, portions of the left insula and left lentiform nucleus inferiorly. ASPECTS 5. Mild associated mass effect at this  time. No evidence of hemorrhagic conversion. 2. Background moderate chronic small vessel ischemic changes within the cerebral white matter. 3. Mild generalized parenchymal atrophy. CTA neck: 1. High-grade (greater than 70%) stenosis within the proximal left ICA. 2. The right common and internal carotids are patent within the neck without significant stenosis. 3. The vertebral arteries are patent within the neck bilaterally without significant stenosis. CTA head: 1. Occlusion of the distal M1 left middle cerebral artery. There is some reconstitution of flow within M2 and more distal left MCA branches, although asymmetrically decreased as compared to the right. 2. The proximal A1 left anterior cerebral artery is poorly delineated. This may be secondary to high-grade stenosis or partial occlusion. 3. Occlusion of the distal basilar artery. Thrombus appears to extend into the proximal P1 posterior cerebral arteries bilaterally. There is some reconstitution of flow within the distal P1 and more distal posterior cerebral arteries bilaterally. 4. Sites of up to moderate stenosis within the bilateral intracranial internal carotid arteries, as described. CT perfusion head: The perfusion software identifies a 51 mL core infarct within the left MCA and PCA vascular territories. The perfusion software identifies a 174 mL region of hypoperfused parenchyma within the left MCA and PCA vascular territory utilizing the Tmax>6 seconds threshold. Reported mismatch: 123 mL Electronically Signed   By: Kellie Simmering DO   On: 05/23/2020 13:25   IR NEURO EACH ADD'L AFTER BASIC UNI LEFT (MS)  Result Date: Jun 03, 2020 INDICATION: 79 year old with past medical history significant for coronary artery disease, atrial fibrillation on anticoagulation and prior stroke. Who presented with altered mental status and right-sided weakness, NIHSS 18. Baseline modified Rankin scale 2. Head CT showed a large infarct involving the left temporal lobe,  posterior left frontal lobe and insula (ASPECTS 5). CT angiogram of the head and neck showed a distal basilar artery occlusion, a left M1 and M2 occlusions. Case discussed with the son by the Neurology team. Given the large core infarct and occlusion of 2 major intracranial vessels, likelihood of good outcome appear unlikely. Son expressed understanding but requested to proceed with intervention. EXAM: Diagnostic cerebral angiogram Mechanical thrombectomy COMPARISON:  CT/CT angiogram of the head May 31, 2020 MEDICATIONS: No antibiotics given. ANESTHESIA/SEDATION: The procedure was performed lesion FLUOROSCOPY TIME:  Fluoroscopy Time: 128 minutes 6 seconds (3530 mGy). COMPLICATIONS: SIR LEVEL D - Requires major therapy, prolonged hospitalization (>48 hours). TECHNIQUE: Informed written consent was obtained from the patient after a thorough discussion of the procedural risks, benefits and alternatives. All questions were addressed. Maximal Sterile Barrier Technique was utilized including caps, mask, sterile gowns, sterile gloves, sterile drape, hand hygiene and skin antiseptic. A timeout was performed prior to the initiation of the procedure. The left groin was prepped and draped in the usual sterile fashion. Using a micropuncture kit and the modified Seldinger technique, access was gained to the left common femoral artery and an 8 French sheath was placed. Under fluoroscopy, a  Zoom 88 guide catheter was navigated over a 6 [place] Berenstein 2 catheter and a 0.035" Terumo Glidewire into the aortic arch. The catheter was placed into the left common carotid artery. Frontal and lateral angiograms of the head were obtained. The catheter was advanced into the upper cervical segment of left internal carotid artery. The inner catheter was removed. Frontal and lateral angiograms of the head were obtained. FINDINGS: 1. Severe tortuosity of the cervical segment of the left ICA. 2. Severe atherosclerotic disease and increased  tortuosity of the cavernous segment of the left ICA with associated 2 mm atherosclerotic aneurysm and focal area of mild stenosis. 3. Occlusion of the distal left M1/MCA with saddle embolus extending into both M2 segments. Increased tortuosity of the ICA-MCA segment. PROCEDURE: Under biplane roadmap, a zoom 71 aspiration catheter was navigated over a phenom 21 microcatheter and a synchro support microguidewire into the cavernous segment of the left ICA. The microcatheter was then navigated over the wire into the left M2/MCA middle division branch. Then, a 5 x 37 mm embotrap stent retriever was deployed spanning the distal M1 and M2 segment. The device was allowed to intercalated with the clot for 4 minutes. The microcatheter was removed. The aspiration catheter was advanced to the level of occlusion and connected to a penumbra aspiration pump. The thrombectomy device and aspiration catheter were removed under constant aspiration. Left ICA angiograms showed recanalization of the posterior and middle division branches (TICI 2B) with prominent contrast extravasation along the M2/MCA branches. The phenom 21 microcatheter was navigated over a synchro support microguidewire into the left M2/MCA middle division branch. A 3 mm x 4 cm target ultra coil was deployed followed by a 3 mm x 6 cm hyper soft 3D coil. Stretching of the 2nd coil noted. Attempt to retrieve proved unsuccessful and the coil tail remain in the left M1. The microcatheter was left in place. Follow-up left ICA angiograms with frontal and lateral views showed resolution of the bleed. The right groin was prepped and draped in the usual sterile fashion. Using a micropuncture kit and the modified Seldinger technique, access was gained to the right common femoral artery and an 8 French sheath was placed. Under fluoroscopy, a 5 [place] Berenstein 2 catheter was navigated over a 0.035" Terumo Glidewire into the aortic arch. The catheter was placed into the left  subclavian artery and then advanced into the left vertebral artery. An exchange length Terumo glidewire was placed in the distal left V2 segment and the catheter was exchanged for a benchmark guide catheter under biplane roadmap. Frontal and lateral angiograms of the head were then obtained. An occlusion of the distal basilar artery was noted. Under biplane roadmap, a phenom 21 microcatheter was navigated over a synchro support microguidewire into the basilar artery. The microcatheter was then navigated over the wire into the right P2 segment. Then, a 5 x 37 mm embotrap stent retriever was deployed spanning the distal basilar, right P1 and P2 segments. The device was allowed to intercalated with the clot for 4 minutes. The microcatheter was removed. The guiding catheter was connected to an aspiration. The thrombectomy device was removed under constant aspiration. Follow-up left vertebral artery angiogram showed complete recanalization of the basilar artery with normal anterograde flow in the bilateral PCAs (TICI 3). The benchmark catheter was subsequently withdrawn. Left ICA angiograms were obtained with frontal and lateral views of the head. Stable occlusion of the middle division branch with maintain opacification of the posterior division branch with stable hemostasis.  A synchro support microwire was advanced to the tip of the microcatheter and the microcatheter was retracted to the M1 segment. Follow-up angiograms with frontal and lateral of the head showed stable findings. The microwire was then retracted. Follow-up angiograms with frontal lateral views of the head showed evidence of recently around the occluded vessel. The microcatheter was again advanced into the middle division branch. The microwire was removed. The microcatheter was prepped with dextrose 50 percent. Slow controlling infusion of nBCA was then performed. The microcatheter was removed under constant aspiration. Follow-up angiogram showed  resolution of the bleed. Flat panel CT of the head was obtained and post processed in a separate workstation with concurrent attending physician supervision. Selected images were sent to PACS. Subarachnoid hemorrhage/contrast extravasation was noted within the basal cisterns and sylvian fissures, left greater than right. The phenom 21 microcatheter was advanced over the wire into the cavernous segment of the left ICA. A 5 x 37 mm embotrap stent retriever was deployed and retrieved in attempt to retrieve the stretched coil. However, attempt was unsuccessful. Control angiograms showed rebleed at the M2 level. The microcatheter was then advanced into the left M2 posterior division, middle division and anterior division branches and angiograms were obtained via microcatheter contrast injection in each branch. Source of bleeding not identified. Follow-up angiogram with contrast injection from the left ICA showed continued bleeding. Source appears to come from ruptured small perforator. The microcatheter was again navigated into the middle division branch an additional 4 mm x 6 cm target ultra coil was deployed. An additional 3 mm x 6 cm hyper soft coil was deployed. Stretching was noted at detachment. Follow-up angiogram showed resolution of the lesion. Delayed follow-up angiogram showed stable hemostasis. The catheter was subsequently withdrawn. A right common femoral artery angiogram was obtained via sheath side port. The puncture is at the level of the common femoral artery. Atherosclerotic changes are noted in the common iliac and common femoral artery without hemodynamically significant stenosis. A left common femoral artery angiogram was obtained via sheath side port. The puncture is at the level of the common femoral artery. Atherosclerotic changes are noted in the visualized lower abdominal aorta, common left iliac, internal and external iliac arteries and common femoral artery with fusiform aneurysm noted in the  internal iliac artery. The left femoral sheath was removed over the wire and an 8 [place] Angio-Seal was utilized for access closure. Immediate hemostasis was achieved. The right femoral sheath was removed over the wire and an 8 [place] Angio-Seal was utilized for access closure. Immediate hemostasis was achieved. IMPRESSION: 1. 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 2. Mechanical thrombectomy for treatment of a left I9/SWN occlusion complicated by hemorrhage likely related to perforator branch stretching, treated with coil and nBCA embolisation. PLAN: - ICU level of care. - Maintaining intubated for airway protection. - SBP 100-120 mmHg. - Son updated by phone and informed of expected poor prognosis. Electronically Signed   By: Pedro Earls M.D.   On: 06-Jun-2020 13:40      HISTORY OF PRESENT ILLNESS RICARDA ATAYDE is a 79 y.o. female with a past medical history of coronary artery disease, atrial fibrillation on anticoagulation, prior strokes with residual generalized weakness (son refused lateralized weakness) requiring cane to walk but able to do all ADLs, last known well around 5 AM on 05/29/2020 when the son left for work and spoke with her.  Later on she was found in the bed unresponsive and  not following commands.  EMS brought her in as altered mental status.  Code stroke was not activated due to no focality on their initial screening exam. She was examined in the emergency room when it was noted that she had some right sided weakness compared to the left in addition to her being not following commands. Neuro consulted and examined in the CAT scanner. Patient was unable to provide any history. Son was able to provide some history over the phone. Premorbid modified Rankin scale (mRS):2   CT scan showed an ASPECTS that looked initially like a 7 but on further review with the radiologist was actually lower-a 5 out of 6. CTA head and  neck was obtained after some technical difficulties with obtaining images/transferring images as well as some delay with getting a line and it revealed multiple abnormalities including a distal basilar occlusion, distal M1 and proximal M2 occlusion on the left as well as multiple other stenoses. MRI no brainstem infarct   She was not given tPA as she was out of the window. Not a good IR candidate d/t large stroke w/ ASPECTS of 5, on AC, basilar occlusion. Dr. Rory Percy discussed indepth with son who states pt wound not want to be vegetative, including risks and benefits. Taken to IR.   HOSPITAL COURSE Ms. MILLENA CALLINS is a 79 y.o. female with history of coronary artery disease, atrial fibrillation on anticoagulation, prior strokes with residual generalized weakness found at home unresponsive and not following commands, presenting in ED w/ R sided weakness, not following commands. CT w/ ASPECTS 5-6. CTA w/ BA occlusion, distal L M1 and proximal M2 occlusions. Not a tPA candidate. Taken to IR.   Stroke:   Large L MCA, L PCA infarcts, in setting of large vessel disease, most likely embolic d/t AF while on AC, s/p IR to L M1 & BA w/ resultant L brain ICH, SAH  Code Stroke CT head large L MCA/PCA territory infarcts (L temporal lobe, L parietal, L occipital, L insula and L lentiform nucleus). Small vessel disease. Atrophy. ASPECTS 5    CTA head L M1 occlusion. Proximal L A1 high-grade stenosis vs partial occlusion. Distal BA occlusion extending into proximal B P1s. B intracranial ICAs w/ moderate stenosis .  CTA neck proximal L ICA 70%.  CT perfusion 26m core L MCA + PCA. 1720mpenumbra w/ 12320mismatch  MRI  L MCA infarct. L hippocampus L PCA territory infarct. small L cerebellar infarcts.   Cerebral angio/IR distal L M1 occlusion w/ TICI2b revascularization w/ contrast extravasation, vessel sacrifice w/ coil + glue. BA w/ TICI3 revascularization.    Post IR CT extensive SAH, large L  temporal lobe ICH. Large L MCA territory infarct. Hyperdense L ICA and L MCA. Increasing L brain mass effect w/o shift  aspirin 81 mg daily and Eliquis (apixaban) daily prior to admission, not treated w/ antithrombotics given hemorrhage   Neuro worsening w/ loss of cough, gag and corneals @ 1951  Goals of care discussion w/ Dr. LinCheral Markerd family. No chance of meaningful recovery. Hemorrhage felt to be fatal. Family decided on DNR w/ terminal extubation, comfort care  Death:  7/12021-07-30 0019  Acute Respiratory Failure  Secondary to stroke  Intubated for IR, left intubated for airway   Terminal wean 06/08/2020 2356  CCM on board  Atrial Fibrillation  Home anticoagulation:  Eliquis (apixaban) daily   Hypertension -> Hypotension  Home meds:  Coreg 12.5, diltiazem 240 . BP goal per IR x  24h following IR procedure  . Treated w/ cleviprex gtt . BP drop . 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 Etiology unclear, however, may related to: 1. Procedure related - either post op afib (pt has previous afib hx s/p MAZE) or left atrium disturbance during procedure or 2. Hypotension and anemia in the setting of carotid atherosclerosis - pt BP was low at 93/80 on 11/29/16 midnight, Hb 6.6 today and pt has left ICA 50% stenosis at bifurcation and athero at cavernous segment  Coronary artery disease  Other Active Problems  CKD stage 3  DEATH  2020/06/13 at 0019  35 minutes were spent preparing discharge.  Burnetta Sabin, MSN, APRN, ANVP-BC, AGPCNP-BC Advanced [facility] Stroke Nurse [facility] for Schedule & Pager  information 06-13-2020 3:26 PM

## 2020-06-18 NOTE — Procedures (Signed)
Extubation Procedure Note  Patient Details:   Name: CALISTA CRAIN DOB: Jul 02, 1941 MRN: [id number]   Airway Documentation:     Vent End Date:  06/11/2020; Vent End Time: 23:56   Evaluation  O2 sats: slowly falling Complications: patient was a terminal extubation Patient did not tolerate procedure well. Bilateral Breath Sounds: Diminished     Ty Oshima P 06-03-20, 12:06 AM

## 2020-06-18 DEATH — deceased
